# Patient Record
Sex: Female | Born: 1935
Health system: Southern US, Community
[De-identification: ages and names within clinical notes are randomized; demographics above are authoritative.]

## PROBLEM LIST (undated history)

## (undated) DIAGNOSIS — L2089 Other atopic dermatitis: Secondary | ICD-10-CM

## (undated) DIAGNOSIS — M545 Low back pain, unspecified: Secondary | ICD-10-CM

## (undated) DIAGNOSIS — J301 Allergic rhinitis due to pollen: Secondary | ICD-10-CM

## (undated) DIAGNOSIS — M810 Age-related osteoporosis without current pathological fracture: Secondary | ICD-10-CM

## (undated) DIAGNOSIS — C801 Malignant (primary) neoplasm, unspecified: Secondary | ICD-10-CM

## (undated) DIAGNOSIS — K2100 Gastro-esophageal reflux disease with esophagitis, without bleeding: Secondary | ICD-10-CM

## (undated) DIAGNOSIS — M899 Disorder of bone, unspecified: Secondary | ICD-10-CM

## (undated) DIAGNOSIS — M949 Disorder of cartilage, unspecified: Secondary | ICD-10-CM

## (undated) DIAGNOSIS — M26609 Unspecified temporomandibular joint disorder, unspecified side: Secondary | ICD-10-CM

## (undated) DIAGNOSIS — F411 Generalized anxiety disorder: Secondary | ICD-10-CM

## (undated) DIAGNOSIS — M1990 Unspecified osteoarthritis, unspecified site: Secondary | ICD-10-CM

## (undated) DIAGNOSIS — A6920 Lyme disease, unspecified: Secondary | ICD-10-CM

## (undated) DIAGNOSIS — R1319 Other dysphagia: Secondary | ICD-10-CM

## (undated) HISTORY — DX: Other dysphagia: R13.19

## (undated) HISTORY — DX: Lyme disease, unspecified: A69.20

## (undated) HISTORY — DX: Malignant (primary) neoplasm, unspecified: C80.1

## (undated) HISTORY — DX: Unspecified osteoarthritis, unspecified site: M19.90

## (undated) HISTORY — DX: Low back pain, unspecified: M54.50

## (undated) HISTORY — DX: Gastro-esophageal reflux disease with esophagitis, without bleeding: K21.00

## (undated) HISTORY — PX: EYE SURGERY: SHX253

## (undated) HISTORY — DX: Age-related osteoporosis without current pathological fracture: M81.0

## (undated) HISTORY — DX: Disorder of bone, unspecified: M89.9

## (undated) HISTORY — DX: Allergic rhinitis due to pollen: J30.1

## (undated) HISTORY — DX: Gastro-esophageal reflux disease with esophagitis: K21.0

## (undated) HISTORY — DX: Low back pain: M54.5

## (undated) HISTORY — DX: Disorder of cartilage, unspecified: M94.9

## (undated) HISTORY — PX: HERNIA REPAIR: SHX51

## (undated) HISTORY — DX: Generalized anxiety disorder: F41.1

## (undated) HISTORY — DX: Other atopic dermatitis: L20.89

## (undated) HISTORY — PX: CATARACT EXTRACTION, BILATERAL: SHX1313

---
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
## 2008-10-22 ENCOUNTER — Encounter
Admission: RE | Admit: 2008-10-22 | Discharge: 2008-10-27 | Payer: Self-pay | Admitting: Physical Medicine & Rehabilitation

## 2008-10-27 ENCOUNTER — Ambulatory Visit: Payer: Self-pay | Admitting: Physical Medicine & Rehabilitation

## 2009-08-24 ENCOUNTER — Encounter: Admission: RE | Admit: 2009-08-24 | Discharge: 2009-08-24 | Payer: Self-pay | Admitting: Obstetrics and Gynecology

## 2009-10-21 ENCOUNTER — Ambulatory Visit (HOSPITAL_COMMUNITY): Admission: RE | Admit: 2009-10-21 | Discharge: 2009-10-21 | Payer: Self-pay | Admitting: Orthopaedic Surgery

## 2010-07-17 LAB — CBC
HCT: 40 % (ref 36.0–46.0)
Hemoglobin: 13.6 g/dL (ref 12.0–15.0)
Platelets: 324 10*3/uL (ref 150–400)
RDW: 13.1 % (ref 11.5–15.5)

## 2010-07-17 LAB — SURGICAL PCR SCREEN: MRSA, PCR: NEGATIVE

## 2010-09-13 NOTE — Group Therapy Note (Signed)
Rachel Morris is here today to be evaluated for her left-sided neck pain.  She has some pain in her TMJ area as well as of her left parascapular  area.  She denies any history of trauma.  She has no history of cancer.  She had been taking Soma, but really did not want to take any medicines.  She had a trigger point injection left sternocleidomastoid and left  lateral neck, per Dr. Andrey Campanile.  She has tried physical therapy, massage,  aquatic therapy.   She walks half hour every day.  She climbs steps.  She drives.  She is  retired.  She is independent with her self-care and mobility.   Social, widowed, lives alone.  Drinks about 2 drinks per day.   Examination, in general, no acute distress.  Mood and affect  appropriate.  Neck range of motion decreased.  Left lateral bending to  about 50% of normal.  Right lateral bending at 75%.  Extension and  flexion is 75% of normal.  She has tenderness over the lateral neck on  the left side.  This is mainly in the lower area rather than in the  suboccipital area.  No tenderness over the sternocleidomastoid.  No  tenderness over the scalene musculature.  No tightness or dystonia  evident.  No evidence of torticollis.  She has tenderness over the left  upper trap as well as upper medial scapular border corresponding to  insertion site of the levator scapular as well as over the infraspinatus  area.  This is on the left side only.  Upper extremity strength is  normal.  Impingement testing is negative.   Crank test is negative.  Hawkins negative.   IMPRESSION:  Myofascial pain, left-sided neck.  She also may have some  underlying cervical spondylosis and facet syndrome.  She may have some  TMJ, but she does not really have much in terms of pain which are  opening, closing, or over the actual TMJ area.   We discussed acupuncture treatment.  The fact that you need to trial it  with weekly.  No less frequent then weekly treatments x4-6.  We can do a  initial treatment today.  She can decide on how she wants to proceed on  this.  I have given [facility] also [facility] at [facility].  He  discussed that Medicare, this is not a covered benefit under Medicare.  We discussed other treatment options including medial branch blocks.   ADDENDUM:  Treatment today consist of D14, D20, connected with 2 Hz  stem, GB 21, SI 11, connected with 2 Hz stem, SI 14, SI 19, 2 Hz stem.  20 minutes treatment time.  The patient tolerated the procedure well.      Erick Colace, M.D.  Electronically Signed     [facility]  D:  10/27/2008 16:15:51  T:  10/28/2008 07:09:27  Job #:  [id number]   cc:   Gloriajean Dell. Andrey Campanile, M.D.  Fax: [phone]

## 2010-10-25 ENCOUNTER — Other Ambulatory Visit: Payer: Self-pay | Admitting: Gastroenterology

## 2010-11-01 ENCOUNTER — Ambulatory Visit
Admission: RE | Admit: 2010-11-01 | Discharge: 2010-11-01 | Disposition: A | Discharge status: Home / Self Care | Payer: Medicare Other | Source: Ambulatory Visit | Attending: Gastroenterology | Admitting: Gastroenterology

## 2011-04-11 ENCOUNTER — Ambulatory Visit (INDEPENDENT_AMBULATORY_CARE_PROVIDER_SITE_OTHER): Payer: Medicare Other

## 2011-04-11 DIAGNOSIS — R509 Fever, unspecified: Secondary | ICD-10-CM

## 2011-04-11 DIAGNOSIS — J029 Acute pharyngitis, unspecified: Secondary | ICD-10-CM

## 2011-04-21 ENCOUNTER — Ambulatory Visit (INDEPENDENT_AMBULATORY_CARE_PROVIDER_SITE_OTHER): Payer: Medicare Other

## 2011-04-21 DIAGNOSIS — K5732 Diverticulitis of large intestine without perforation or abscess without bleeding: Secondary | ICD-10-CM

## 2011-04-21 DIAGNOSIS — R197 Diarrhea, unspecified: Secondary | ICD-10-CM

## 2011-05-09 DIAGNOSIS — R142 Eructation: Secondary | ICD-10-CM | POA: Diagnosis not present

## 2011-05-09 DIAGNOSIS — K648 Other hemorrhoids: Secondary | ICD-10-CM | POA: Diagnosis not present

## 2011-05-09 DIAGNOSIS — R1012 Left upper quadrant pain: Secondary | ICD-10-CM | POA: Diagnosis not present

## 2011-05-09 DIAGNOSIS — K5900 Constipation, unspecified: Secondary | ICD-10-CM | POA: Diagnosis not present

## 2011-05-24 DIAGNOSIS — Z961 Presence of intraocular lens: Secondary | ICD-10-CM | POA: Diagnosis not present

## 2011-05-24 DIAGNOSIS — H40059 Ocular hypertension, unspecified eye: Secondary | ICD-10-CM | POA: Diagnosis not present

## 2011-05-24 DIAGNOSIS — H43819 Vitreous degeneration, unspecified eye: Secondary | ICD-10-CM | POA: Diagnosis not present

## 2011-05-24 DIAGNOSIS — H40019 Open angle with borderline findings, low risk, unspecified eye: Secondary | ICD-10-CM | POA: Diagnosis not present

## 2011-09-15 DIAGNOSIS — L905 Scar conditions and fibrosis of skin: Secondary | ICD-10-CM | POA: Diagnosis not present

## 2011-09-15 DIAGNOSIS — L821 Other seborrheic keratosis: Secondary | ICD-10-CM | POA: Diagnosis not present

## 2011-09-15 DIAGNOSIS — L570 Actinic keratosis: Secondary | ICD-10-CM | POA: Diagnosis not present

## 2011-09-15 DIAGNOSIS — Z85828 Personal history of other malignant neoplasm of skin: Secondary | ICD-10-CM | POA: Diagnosis not present

## 2011-09-15 DIAGNOSIS — D239 Other benign neoplasm of skin, unspecified: Secondary | ICD-10-CM | POA: Diagnosis not present

## 2011-09-15 DIAGNOSIS — D485 Neoplasm of uncertain behavior of skin: Secondary | ICD-10-CM | POA: Diagnosis not present

## 2011-10-05 ENCOUNTER — Ambulatory Visit (INDEPENDENT_AMBULATORY_CARE_PROVIDER_SITE_OTHER): Payer: Medicare Other | Admitting: Emergency Medicine

## 2011-10-05 VITALS — BP 139/76 | HR 96 | Temp 98.1°F | Resp 16 | Ht 63.5 in | Wt 125.0 lb

## 2011-10-05 DIAGNOSIS — T148 Other injury of unspecified body region: Secondary | ICD-10-CM

## 2011-10-05 DIAGNOSIS — W57XXXA Bitten or stung by nonvenomous insect and other nonvenomous arthropods, initial encounter: Secondary | ICD-10-CM

## 2011-10-05 MED ORDER — DOXYCYCLINE HYCLATE 100 MG PO CAPS: 200.0000 mg | ORAL_CAPSULE | Freq: Once | ORAL | Status: AC

## 2011-10-05 NOTE — Patient Instructions (Signed)
Deer Tick Bite  Deer ticks are brown arachnids (spider family) that vary in size from as small as the head of a pin to 1/4 inch (1/2 cm) diameter. They thrive in wooded areas. Deer are the preferred host of adult deer ticks. Small rodents are the host of young ticks (nymphs). When a person walks in a field or wooded area, young and adult ticks in the surrounding grass and vegetation can attach themselves to the skin. They can suck blood for hours to days if unnoticed. Ticks are found all over the [place]  Some ticks carry a specific bacteria (Borrelia burgdorferi) that causes an infection called Lyme disease. The bacteria is typically passed into a person during the blood sucking process. This happens after the tick has been attached for at least a number of hours. While ticks can be found all over the [place] those carrying the bacteria that causes Lyme disease are most common in [place] and the [place]. Only a small proportion of ticks in these areas carry the Lyme disease bacteria and cause human infections.  Ticks usually attach to warm spots on the body, such as the:   Head.    Back.    Neck.    Armpits.    Groin.   SYMPTOMS   Most of the time, a deer tick bite will not be felt. You may or may not see the attached tick. You may notice mild irritation or redness around the bite site. If the deer tick passes the Lyme disease bacteria to a person, a round, red rash may be noticed 2 to 3 days after the bite. The rash may be clear in the middle, like a bull's-eye or target.  If not treated, other symptoms may develop several days to weeks after the onset of the rash. These symptoms may include:   New rash lesions.    Fatigue and weakness.    General ill feeling and achiness.    Chills.    Headache and neck pain.    Swollen lymph glands.    Sore muscles and joints.    5 to 15% of untreated people with Lyme disease may develop more severe illnesses after several weeks to months. This may include inflammation of the brain lining (meningitis), nerve palsies, an abnormal heartbeat, or severe muscle and joint pain and inflammation (myositis or arthritis).  DIAGNOSIS     Physical exam and medical history.    Viewing the tick if it was saved for confirmation.    Blood tests (to check or confirm the presence of Lyme disease).   TREATMENT    Most ticks do not carry disease. If found, an attached tick should be removed using tweezers. Tweezers should be placed under the body of the tick so it is removed by its attachment parts (pincers).  If there are signs or symptoms of being sick, or Lyme disease is confirmed, medicines (antibiotics) that kill germs are usually prescribed. In more severe cases, antibiotics may be given through an intravenous (IV) access.  HOME CARE INSTRUCTIONS     Always remove ticks with tweezers. Do not use petroleum jelly or other methods to kill or remove the tick. Slide the tweezers under the body and pull out as much as you can. If you are not sure what it is, save it in a jar and show your caregiver.    Once you remove the tick, the skin will heal on its own. Wash your hands and the affected area with water   and soap. You may place a bandage on the affected area.    Take medicine as directed. You may be advised to take a full course of antibiotics.    Follow up with your caregiver as recommended.   FINDING OUT THE RESULTS OF YOUR TEST  Not all test results are available during your visit. If your test results are not back during the visit, make an appointment with your caregiver to find out the results. Do not assume everything is normal if you have not heard from your caregiver or the medical facility. It is important for you to follow up on all of your test results.  PROGNOSIS     If Lyme disease is confirmed, early treatment with antibiotics is very effective. Following preventive guidelines is important since it is possible to get the disease more than once.  PREVENTION     Wear long sleeves and long pants in wooded or grassy areas. Tuck your pants into your socks.    Use an insect repellent while hiking.    Check yourself, your children, and your pets regularly for ticks after playing outside.    Clear piles of leaves or brush from your yard. Ticks might live there.   SEEK MEDICAL CARE IF:     You or your child has an oral temperature above 102 F (38.9 C).    You develop a severe headache following the bite.    You feel generally ill.    You notice a rash.    You are having trouble removing the tick.    The bite area has red skin or yellow drainage.   SEEK IMMEDIATE MEDICAL CARE IF:     Your face is weak and droopy or you have other neurological symptoms.    You have severe joint pain or weakness.   MAKE SURE YOU:     Understand these instructions.    Will watch your condition.    Will get help right away if you are not doing well or get worse.   FOR MORE INFORMATION  Centers for Disease Control and Prevention: www.cdc.gov  American Academy of Family Physicians: www.aafp.org  Document Released: 07/12/2009 Document Revised: 04/06/2011 Document Reviewed: 07/12/2009  [employer] Patient Information 2012 [employer].    Lyme Disease  You may have been bitten by a tick and are to watch for the development of Lyme Disease. Lyme Disease is an infection that is caused by a bacteria The bacteria causing this disease is named Borreilia burgdorferi. If a tick is infected with this bacteria and then bites you, then Lyme Disease may occur. These ticks are carried by deer and rodents such as rabbits and mice and infest grassy as well as forested areas. Fortunately most tick bites do not cause Lyme Disease.     Lyme Disease is easier to prevent than to treat. First, covering your legs with clothing when walking in areas where ticks are possibly abundant will prevent their attachment because ticks tend to stay within inches of the ground. Second, using insecticides containing DEET can be applied on skin or clothing. Last, because it takes about 12 to 24 hours for the tick to transmit the disease after attachment to the human host, you should inspect your body for ticks twice a day when you are in areas where Lyme Disease is common. You must look thoroughly when searching for ticks. The Ixodes tick that carries Lyme Disease is very small. It is around the size of a sesame seed (picture   of tick is not actual size). Removal is best done by grasping the tick by the head and pulling it out. Do not to squeeze the body of the tick. This could inject the infecting bacteria into the bite site. Wash the area of the bite with an antiseptic solution after removal.    Lyme Disease is a disease that may affect many body systems. Because of the small size of the biting tick, most people do not notice being bitten. The first sign of an infection is usually a round red rash that extends out from the center of the tick bite. The center of the lesion may be blood colored (hemorrhagic) or have tiny blisters (vesicular). Most lesions have bright red outer borders and partial central clearing. This rash may extend out many inches in diameter, and multiple lesions may be present. Other symptoms such as fatigue, headaches, chills and fever, general achiness and swelling of lymph glands may also occur. If this first stage of the disease is left untreated, these symptoms may gradually resolve by themselves, or progressive symptoms may occur because of spread of infection to other areas of the body.     Follow up with your caregiver to have testing and treatment if you have a tick bite and you develop any of the above complaints. Your caregiver may recommend preventative (prophylactic) medications which kill bacteria (antibiotics). Once a diagnosis of Lyme Disease is made, antibiotic treatment is highly likely to cure the disease. Effective treatment of late stage Lyme Disease may require longer courses of antibiotic therapy.    MAKE SURE YOU:     Understand these instructions.    Will watch your condition.    Will get help right away if you are not doing well or get worse.   Document Released: 07/24/2000 Document Revised: 04/06/2011 Document Reviewed: 09/25/2008  [employer] Patient Information 2012 [employer].

## 2011-10-05 NOTE — Progress Notes (Signed)
  Subjective:    Patient ID: Rachel Morris, female    DOB: 08-22-1930, 78 y.o.   MRN: [id number]  HPI Comments: Found two ticks on skin one embedded other ambulating.  History of lyme disease and is quite worried about again being infected.  Tick may have been embedded for several days.  Asymptomatic      Review of Systems  Constitutional: Negative.   HENT: Negative.   Eyes: Negative.   Respiratory: Negative.   Cardiovascular: Negative.   Gastrointestinal: Negative.   Genitourinary: Negative.   Musculoskeletal: Negative.        Objective:   Physical Exam  Constitutional: She appears well-developed and well-nourished.  HENT:  Head: Normocephalic and atraumatic.  Right Ear: External ear normal.  Left Ear: External ear normal.  Mouth/Throat: Oropharynx is clear and moist.  Eyes: Conjunctivae and EOM are normal. Pupils are equal, round, and reactive to light.  Neck: Normal range of motion. Neck supple.  Cardiovascular: Normal rate and regular rhythm.   Pulmonary/Chest: Effort normal.  Abdominal: Soft.  Musculoskeletal: Normal range of motion.  Lymphadenopathy:    She has no cervical adenopathy.  Skin: Skin is warm and dry.          Assessment & Plan:  Discussed risk and benefit of prophylaxis vs treatment.  She is concerned that she should have labs done and i reassured her that it was too early for acute labs to be positive given her time frame and absence of symptoms.

## 2011-10-07 DIAGNOSIS — H60399 Other infective otitis externa, unspecified ear: Secondary | ICD-10-CM | POA: Diagnosis not present

## 2011-10-13 ENCOUNTER — Other Ambulatory Visit: Payer: Self-pay | Admitting: Internal Medicine

## 2011-10-13 DIAGNOSIS — L03211 Cellulitis of face: Secondary | ICD-10-CM | POA: Diagnosis not present

## 2011-10-13 DIAGNOSIS — M545 Low back pain, unspecified: Secondary | ICD-10-CM | POA: Diagnosis not present

## 2011-10-13 DIAGNOSIS — L2089 Other atopic dermatitis: Secondary | ICD-10-CM | POA: Diagnosis not present

## 2011-10-13 DIAGNOSIS — Z13220 Encounter for screening for lipoid disorders: Secondary | ICD-10-CM | POA: Diagnosis not present

## 2011-10-13 DIAGNOSIS — Z780 Asymptomatic menopausal state: Secondary | ICD-10-CM

## 2011-10-13 DIAGNOSIS — R1319 Other dysphagia: Secondary | ICD-10-CM | POA: Diagnosis not present

## 2011-10-13 DIAGNOSIS — E785 Hyperlipidemia, unspecified: Secondary | ICD-10-CM | POA: Diagnosis not present

## 2011-10-13 DIAGNOSIS — L0201 Cutaneous abscess of face: Secondary | ICD-10-CM | POA: Diagnosis not present

## 2011-10-13 DIAGNOSIS — M899 Disorder of bone, unspecified: Secondary | ICD-10-CM | POA: Diagnosis not present

## 2011-10-13 DIAGNOSIS — F411 Generalized anxiety disorder: Secondary | ICD-10-CM | POA: Diagnosis not present

## 2011-10-14 DIAGNOSIS — R21 Rash and other nonspecific skin eruption: Secondary | ICD-10-CM | POA: Diagnosis not present

## 2011-10-20 ENCOUNTER — Other Ambulatory Visit: Payer: Self-pay | Admitting: Internal Medicine

## 2011-10-20 ENCOUNTER — Ambulatory Visit
Admission: RE | Admit: 2011-10-20 | Discharge: 2011-10-20 | Disposition: A | Discharge status: Home / Self Care | Payer: Medicare Other | Source: Ambulatory Visit | Attending: Internal Medicine | Admitting: Internal Medicine

## 2011-10-20 DIAGNOSIS — M25559 Pain in unspecified hip: Secondary | ICD-10-CM

## 2011-10-20 DIAGNOSIS — M545 Low back pain: Secondary | ICD-10-CM

## 2011-10-20 DIAGNOSIS — L0201 Cutaneous abscess of face: Secondary | ICD-10-CM | POA: Diagnosis not present

## 2011-10-20 DIAGNOSIS — M899 Disorder of bone, unspecified: Secondary | ICD-10-CM | POA: Diagnosis not present

## 2011-10-20 DIAGNOSIS — L03211 Cellulitis of face: Secondary | ICD-10-CM | POA: Diagnosis not present

## 2011-10-20 DIAGNOSIS — M549 Dorsalgia, unspecified: Secondary | ICD-10-CM

## 2011-10-20 DIAGNOSIS — E785 Hyperlipidemia, unspecified: Secondary | ICD-10-CM | POA: Diagnosis not present

## 2011-10-20 DIAGNOSIS — M169 Osteoarthritis of hip, unspecified: Secondary | ICD-10-CM | POA: Diagnosis not present

## 2011-10-20 DIAGNOSIS — M949 Disorder of cartilage, unspecified: Secondary | ICD-10-CM | POA: Diagnosis not present

## 2011-10-20 DIAGNOSIS — J301 Allergic rhinitis due to pollen: Secondary | ICD-10-CM | POA: Diagnosis not present

## 2011-10-20 DIAGNOSIS — M47814 Spondylosis without myelopathy or radiculopathy, thoracic region: Secondary | ICD-10-CM | POA: Diagnosis not present

## 2011-10-20 DIAGNOSIS — M5137 Other intervertebral disc degeneration, lumbosacral region: Secondary | ICD-10-CM | POA: Diagnosis not present

## 2011-10-25 ENCOUNTER — Ambulatory Visit: Payer: Medicare Other | Attending: Internal Medicine

## 2011-10-25 DIAGNOSIS — M25559 Pain in unspecified hip: Secondary | ICD-10-CM | POA: Diagnosis not present

## 2011-10-25 DIAGNOSIS — IMO0001 Reserved for inherently not codable concepts without codable children: Secondary | ICD-10-CM | POA: Insufficient documentation

## 2011-10-25 DIAGNOSIS — M545 Low back pain, unspecified: Secondary | ICD-10-CM | POA: Diagnosis not present

## 2011-10-26 ENCOUNTER — Ambulatory Visit
Admission: RE | Admit: 2011-10-26 | Discharge: 2011-10-26 | Disposition: A | Discharge status: Home / Self Care | Payer: Medicare Other | Source: Ambulatory Visit | Attending: Internal Medicine | Admitting: Internal Medicine

## 2011-10-26 DIAGNOSIS — Z780 Asymptomatic menopausal state: Secondary | ICD-10-CM

## 2011-10-26 DIAGNOSIS — Z13820 Encounter for screening for osteoporosis: Secondary | ICD-10-CM | POA: Diagnosis not present

## 2011-11-03 ENCOUNTER — Ambulatory Visit: Payer: Medicare Other | Attending: Internal Medicine

## 2011-11-03 DIAGNOSIS — IMO0001 Reserved for inherently not codable concepts without codable children: Secondary | ICD-10-CM | POA: Insufficient documentation

## 2011-11-03 DIAGNOSIS — M545 Low back pain, unspecified: Secondary | ICD-10-CM | POA: Insufficient documentation

## 2011-11-03 DIAGNOSIS — M25559 Pain in unspecified hip: Secondary | ICD-10-CM | POA: Insufficient documentation

## 2011-11-06 ENCOUNTER — Ambulatory Visit: Payer: Medicare Other | Admitting: Physical Therapy

## 2011-11-06 DIAGNOSIS — IMO0001 Reserved for inherently not codable concepts without codable children: Secondary | ICD-10-CM | POA: Diagnosis not present

## 2011-11-06 DIAGNOSIS — M545 Low back pain: Secondary | ICD-10-CM | POA: Diagnosis not present

## 2011-11-06 DIAGNOSIS — M25559 Pain in unspecified hip: Secondary | ICD-10-CM | POA: Diagnosis not present

## 2011-11-08 ENCOUNTER — Ambulatory Visit: Payer: Medicare Other

## 2011-11-08 DIAGNOSIS — M25559 Pain in unspecified hip: Secondary | ICD-10-CM | POA: Diagnosis not present

## 2011-11-08 DIAGNOSIS — IMO0001 Reserved for inherently not codable concepts without codable children: Secondary | ICD-10-CM | POA: Diagnosis not present

## 2011-11-08 DIAGNOSIS — M545 Low back pain: Secondary | ICD-10-CM | POA: Diagnosis not present

## 2011-11-13 ENCOUNTER — Ambulatory Visit: Payer: Medicare Other

## 2011-11-13 DIAGNOSIS — M25559 Pain in unspecified hip: Secondary | ICD-10-CM | POA: Diagnosis not present

## 2011-11-13 DIAGNOSIS — IMO0001 Reserved for inherently not codable concepts without codable children: Secondary | ICD-10-CM | POA: Diagnosis not present

## 2011-11-13 DIAGNOSIS — M545 Low back pain: Secondary | ICD-10-CM | POA: Diagnosis not present

## 2011-11-15 ENCOUNTER — Ambulatory Visit: Payer: Medicare Other | Admitting: Rehabilitation

## 2011-11-15 DIAGNOSIS — M545 Low back pain: Secondary | ICD-10-CM | POA: Diagnosis not present

## 2011-11-15 DIAGNOSIS — IMO0001 Reserved for inherently not codable concepts without codable children: Secondary | ICD-10-CM | POA: Diagnosis not present

## 2011-11-15 DIAGNOSIS — M25559 Pain in unspecified hip: Secondary | ICD-10-CM | POA: Diagnosis not present

## 2011-11-20 ENCOUNTER — Ambulatory Visit: Payer: Medicare Other | Admitting: Rehabilitation

## 2011-11-20 DIAGNOSIS — M25559 Pain in unspecified hip: Secondary | ICD-10-CM | POA: Diagnosis not present

## 2011-11-20 DIAGNOSIS — IMO0001 Reserved for inherently not codable concepts without codable children: Secondary | ICD-10-CM | POA: Diagnosis not present

## 2011-11-20 DIAGNOSIS — M545 Low back pain: Secondary | ICD-10-CM | POA: Diagnosis not present

## 2011-11-21 DIAGNOSIS — H40059 Ocular hypertension, unspecified eye: Secondary | ICD-10-CM | POA: Diagnosis not present

## 2011-11-21 DIAGNOSIS — H43819 Vitreous degeneration, unspecified eye: Secondary | ICD-10-CM | POA: Diagnosis not present

## 2011-11-21 DIAGNOSIS — H40019 Open angle with borderline findings, low risk, unspecified eye: Secondary | ICD-10-CM | POA: Diagnosis not present

## 2011-11-21 DIAGNOSIS — Z961 Presence of intraocular lens: Secondary | ICD-10-CM | POA: Diagnosis not present

## 2011-11-23 ENCOUNTER — Ambulatory Visit: Payer: Medicare Other | Admitting: Physical Therapy

## 2011-11-23 DIAGNOSIS — IMO0001 Reserved for inherently not codable concepts without codable children: Secondary | ICD-10-CM | POA: Diagnosis not present

## 2011-11-23 DIAGNOSIS — M545 Low back pain: Secondary | ICD-10-CM | POA: Diagnosis not present

## 2011-11-23 DIAGNOSIS — M25559 Pain in unspecified hip: Secondary | ICD-10-CM | POA: Diagnosis not present

## 2011-11-27 ENCOUNTER — Ambulatory Visit: Payer: Medicare Other | Admitting: Rehabilitation

## 2011-11-27 DIAGNOSIS — L259 Unspecified contact dermatitis, unspecified cause: Secondary | ICD-10-CM | POA: Diagnosis not present

## 2011-11-27 DIAGNOSIS — IMO0001 Reserved for inherently not codable concepts without codable children: Secondary | ICD-10-CM | POA: Diagnosis not present

## 2011-11-27 DIAGNOSIS — L821 Other seborrheic keratosis: Secondary | ICD-10-CM | POA: Diagnosis not present

## 2011-11-27 DIAGNOSIS — M25559 Pain in unspecified hip: Secondary | ICD-10-CM | POA: Diagnosis not present

## 2011-11-27 DIAGNOSIS — M545 Low back pain: Secondary | ICD-10-CM | POA: Diagnosis not present

## 2011-11-29 ENCOUNTER — Ambulatory Visit: Payer: Medicare Other | Admitting: Physical Therapy

## 2011-11-29 DIAGNOSIS — M545 Low back pain: Secondary | ICD-10-CM | POA: Diagnosis not present

## 2011-11-29 DIAGNOSIS — M25559 Pain in unspecified hip: Secondary | ICD-10-CM | POA: Diagnosis not present

## 2011-11-29 DIAGNOSIS — IMO0001 Reserved for inherently not codable concepts without codable children: Secondary | ICD-10-CM | POA: Diagnosis not present

## 2011-12-06 ENCOUNTER — Ambulatory Visit: Payer: Medicare Other | Attending: Internal Medicine | Admitting: Rehabilitation

## 2011-12-06 DIAGNOSIS — M545 Low back pain, unspecified: Secondary | ICD-10-CM | POA: Insufficient documentation

## 2011-12-06 DIAGNOSIS — IMO0001 Reserved for inherently not codable concepts without codable children: Secondary | ICD-10-CM | POA: Diagnosis not present

## 2011-12-06 DIAGNOSIS — M25559 Pain in unspecified hip: Secondary | ICD-10-CM | POA: Diagnosis not present

## 2011-12-08 ENCOUNTER — Ambulatory Visit: Payer: Medicare Other | Admitting: Rehabilitation

## 2011-12-08 DIAGNOSIS — M25559 Pain in unspecified hip: Secondary | ICD-10-CM | POA: Diagnosis not present

## 2011-12-08 DIAGNOSIS — IMO0001 Reserved for inherently not codable concepts without codable children: Secondary | ICD-10-CM | POA: Diagnosis not present

## 2011-12-08 DIAGNOSIS — M545 Low back pain: Secondary | ICD-10-CM | POA: Diagnosis not present

## 2011-12-11 ENCOUNTER — Ambulatory Visit: Payer: Medicare Other | Admitting: Rehabilitation

## 2011-12-11 DIAGNOSIS — M545 Low back pain: Secondary | ICD-10-CM | POA: Diagnosis not present

## 2011-12-11 DIAGNOSIS — M25559 Pain in unspecified hip: Secondary | ICD-10-CM | POA: Diagnosis not present

## 2011-12-11 DIAGNOSIS — IMO0001 Reserved for inherently not codable concepts without codable children: Secondary | ICD-10-CM | POA: Diagnosis not present

## 2011-12-13 ENCOUNTER — Ambulatory Visit: Payer: Medicare Other | Admitting: Rehabilitation

## 2011-12-13 DIAGNOSIS — IMO0001 Reserved for inherently not codable concepts without codable children: Secondary | ICD-10-CM | POA: Diagnosis not present

## 2011-12-13 DIAGNOSIS — L259 Unspecified contact dermatitis, unspecified cause: Secondary | ICD-10-CM | POA: Diagnosis not present

## 2011-12-13 DIAGNOSIS — M545 Low back pain: Secondary | ICD-10-CM | POA: Diagnosis not present

## 2011-12-13 DIAGNOSIS — M25559 Pain in unspecified hip: Secondary | ICD-10-CM | POA: Diagnosis not present

## 2011-12-20 ENCOUNTER — Ambulatory Visit: Payer: Medicare Other | Admitting: Physical Therapy

## 2011-12-20 DIAGNOSIS — M25559 Pain in unspecified hip: Secondary | ICD-10-CM | POA: Diagnosis not present

## 2011-12-20 DIAGNOSIS — IMO0001 Reserved for inherently not codable concepts without codable children: Secondary | ICD-10-CM | POA: Diagnosis not present

## 2011-12-20 DIAGNOSIS — M545 Low back pain: Secondary | ICD-10-CM | POA: Diagnosis not present

## 2011-12-22 ENCOUNTER — Ambulatory Visit: Payer: Medicare Other | Admitting: Rehabilitation

## 2011-12-22 DIAGNOSIS — IMO0001 Reserved for inherently not codable concepts without codable children: Secondary | ICD-10-CM | POA: Diagnosis not present

## 2011-12-22 DIAGNOSIS — M25559 Pain in unspecified hip: Secondary | ICD-10-CM | POA: Diagnosis not present

## 2011-12-22 DIAGNOSIS — M545 Low back pain: Secondary | ICD-10-CM | POA: Diagnosis not present

## 2011-12-29 DIAGNOSIS — L259 Unspecified contact dermatitis, unspecified cause: Secondary | ICD-10-CM | POA: Diagnosis not present

## 2011-12-29 DIAGNOSIS — I789 Disease of capillaries, unspecified: Secondary | ICD-10-CM | POA: Diagnosis not present

## 2012-01-29 DIAGNOSIS — J3081 Allergic rhinitis due to animal (cat) (dog) hair and dander: Secondary | ICD-10-CM | POA: Diagnosis not present

## 2012-01-29 DIAGNOSIS — J301 Allergic rhinitis due to pollen: Secondary | ICD-10-CM | POA: Diagnosis not present

## 2012-01-29 DIAGNOSIS — L2089 Other atopic dermatitis: Secondary | ICD-10-CM | POA: Diagnosis not present

## 2012-01-29 DIAGNOSIS — J3089 Other allergic rhinitis: Secondary | ICD-10-CM | POA: Diagnosis not present

## 2012-02-15 DIAGNOSIS — Z23 Encounter for immunization: Secondary | ICD-10-CM | POA: Diagnosis not present

## 2012-04-01 DIAGNOSIS — M545 Low back pain, unspecified: Secondary | ICD-10-CM | POA: Diagnosis not present

## 2012-04-01 DIAGNOSIS — N8111 Cystocele, midline: Secondary | ICD-10-CM | POA: Diagnosis not present

## 2012-04-01 DIAGNOSIS — J301 Allergic rhinitis due to pollen: Secondary | ICD-10-CM | POA: Diagnosis not present

## 2012-04-01 DIAGNOSIS — L2089 Other atopic dermatitis: Secondary | ICD-10-CM | POA: Diagnosis not present

## 2012-05-28 DIAGNOSIS — H40059 Ocular hypertension, unspecified eye: Secondary | ICD-10-CM | POA: Diagnosis not present

## 2012-05-28 DIAGNOSIS — H40019 Open angle with borderline findings, low risk, unspecified eye: Secondary | ICD-10-CM | POA: Diagnosis not present

## 2012-05-28 DIAGNOSIS — Z961 Presence of intraocular lens: Secondary | ICD-10-CM | POA: Diagnosis not present

## 2012-05-28 DIAGNOSIS — H43819 Vitreous degeneration, unspecified eye: Secondary | ICD-10-CM | POA: Diagnosis not present

## 2012-06-24 DIAGNOSIS — J3089 Other allergic rhinitis: Secondary | ICD-10-CM | POA: Diagnosis not present

## 2012-06-24 DIAGNOSIS — J3081 Allergic rhinitis due to animal (cat) (dog) hair and dander: Secondary | ICD-10-CM | POA: Diagnosis not present

## 2012-06-24 DIAGNOSIS — J301 Allergic rhinitis due to pollen: Secondary | ICD-10-CM | POA: Diagnosis not present

## 2012-06-24 DIAGNOSIS — L2089 Other atopic dermatitis: Secondary | ICD-10-CM | POA: Diagnosis not present

## 2012-06-25 DIAGNOSIS — F411 Generalized anxiety disorder: Secondary | ICD-10-CM | POA: Diagnosis not present

## 2012-06-25 DIAGNOSIS — R079 Chest pain, unspecified: Secondary | ICD-10-CM | POA: Diagnosis not present

## 2012-07-02 DIAGNOSIS — F411 Generalized anxiety disorder: Secondary | ICD-10-CM | POA: Diagnosis not present

## 2012-07-02 DIAGNOSIS — R079 Chest pain, unspecified: Secondary | ICD-10-CM | POA: Diagnosis not present

## 2012-07-08 DIAGNOSIS — I209 Angina pectoris, unspecified: Secondary | ICD-10-CM | POA: Diagnosis not present

## 2012-07-18 ENCOUNTER — Telehealth: Payer: Self-pay | Admitting: *Deleted

## 2012-07-18 NOTE — Telephone Encounter (Signed)
Patient called and wanted too know if her Stress test results are ready. I told her that when we get the results we would call her. She said thank you and hung up.

## 2012-08-13 ENCOUNTER — Encounter: Payer: Self-pay | Admitting: Internal Medicine

## 2012-09-01 ENCOUNTER — Ambulatory Visit (INDEPENDENT_AMBULATORY_CARE_PROVIDER_SITE_OTHER): Payer: Medicare Other | Admitting: Family Medicine

## 2012-09-01 VITALS — BP 136/70 | HR 80 | Temp 98.6°F | Resp 16 | Ht 63.5 in | Wt 135.0 lb

## 2012-09-01 DIAGNOSIS — R300 Dysuria: Secondary | ICD-10-CM | POA: Diagnosis not present

## 2012-09-01 DIAGNOSIS — N390 Urinary tract infection, site not specified: Secondary | ICD-10-CM

## 2012-09-01 DIAGNOSIS — L293 Anogenital pruritus, unspecified: Secondary | ICD-10-CM | POA: Diagnosis not present

## 2012-09-01 DIAGNOSIS — R350 Frequency of micturition: Secondary | ICD-10-CM | POA: Diagnosis not present

## 2012-09-01 DIAGNOSIS — N898 Other specified noninflammatory disorders of vagina: Secondary | ICD-10-CM

## 2012-09-01 LAB — POCT URINALYSIS DIPSTICK
Protein, UA: NEGATIVE
Spec Grav, UA: 1.01
Urobilinogen, UA: 0.2

## 2012-09-01 LAB — POCT WET PREP WITH KOH
Clue Cells Wet Prep HPF POC: NEGATIVE
KOH Prep POC: NEGATIVE
RBC Wet Prep HPF POC: NEGATIVE
Trichomonas, UA: NEGATIVE
Yeast Wet Prep HPF POC: NEGATIVE

## 2012-09-01 LAB — POCT UA - MICROSCOPIC ONLY
Mucus, UA: NEGATIVE
RBC, urine, microscopic: NEGATIVE

## 2012-09-01 MED ORDER — NITROFURANTOIN MONOHYD MACRO 100 MG PO CAPS: 100.0000 mg | ORAL_CAPSULE | Freq: Two times a day (BID) | ORAL | Status: DC

## 2012-09-01 MED ORDER — SULFAMETHOXAZOLE-TRIMETHOPRIM 800-160 MG PO TABS: 1.0000 | ORAL_TABLET | Freq: Two times a day (BID) | ORAL | Status: DC

## 2012-09-01 NOTE — Progress Notes (Signed)
Subjective:    Patient ID: Rachel Morris, female    DOB: 25-Aug-1930, 79 y.o.   MRN: [id number]  HPI [name] is a 79 y.o. female  Wears pessary for uterine prolapse past 6 years.  Uses vagifem - but missed some doses recently. , notes frequency/urgency - past week.  No fever, no n/v.  Slight soreness in lower abdomen.  Noted sore sensation with urinating, slight itchy feeling in vagina - no discharge. No recent antibiotics. No new back pain.  Tx: cranberry juices, and probiotic.   Review of Systems  Constitutional: Negative for fever and chills.  Gastrointestinal: Positive for abdominal pain (lower abdomen - slight uncomfortable. ).  Genitourinary: Positive for dysuria, urgency and frequency. Negative for hematuria, decreased urine volume, vaginal bleeding and vaginal discharge.  Musculoskeletal: Negative for back pain (no new back pain. ).  Skin: Negative for rash.       Objective:   Physical Exam  Vitals reviewed. Constitutional: She is oriented to person, place, and time. She appears well-developed and well-nourished.  HENT:  Head: Normocephalic and atraumatic.  Pulmonary/Chest: Effort normal.  Abdominal: Soft. Normal appearance. She exhibits no distension. There is tenderness (minimmal ). There is no rebound, no guarding and no CVA tenderness.  Genitourinary: There is no rash on the right labia. There is no rash on the left labia. No erythema, tenderness or bleeding around the vagina. No foreign body around the vagina. Vaginal discharge found.  Slightly atrophic.   Neurological: She is alert and oriented to person, place, and time.  Skin: Skin is warm and dry. No rash noted.  Psychiatric: She has a normal mood and affect. Her behavior is normal.     Results for orders placed in visit on 09/01/12  POCT UA - MICROSCOPIC ONLY      Result Value Range   WBC, Ur, HPF, POC 24-37     RBC, urine, microscopic neg     Bacteria, U Microscopic 2+     Mucus, UA neg     Epithelial cells, urine per micros 0-4     Crystals, Ur, HPF, POC neg     Casts, Ur, LPF, POC neg     Yeast, UA neg    POCT URINALYSIS DIPSTICK      Result Value Range   Color, UA yellow     Clarity, UA cloudy     Glucose, UA neg     Bilirubin, UA neg     Ketones, UA neg     Spec Grav, UA 1.010     Blood, UA trace     pH, UA 6.5     Protein, UA neg     Urobilinogen, UA 0.2     Nitrite, UA neg     Leukocytes, UA large (3+)    POCT WET PREP WITH KOH      Result Value Range   Trichomonas, UA Negative     Clue Cells Wet Prep HPF POC neg     Epithelial Wet Prep HPF POC 2-5     Yeast Wet Prep HPF POC neg     Bacteria Wet Prep HPF POC 2+     RBC Wet Prep HPF POC neg     WBC Wet Prep HPF POC 2-6     KOH Prep POC Negative        Assessment & Plan:  [name] is a 79 y.o. female Dysuria - Plan: POCT UA - Microscopic Only, POCT urinalysis dipstick, Urine culture  Urinary frequency - Plan: POCT UA - Microscopic Only, POCT urinalysis dipstick, Urine culture  UTI (urinary tract infection) - Plan: Urine culture, sulfamethoxazole-trimethoprim (BACTRIM DS,SEPTRA DS) 800-160 MG per tablet, DISCONTINUED: nitrofurantoin, macrocrystal-monohydrate, (MACROBID) 100 MG capsule  Vaginal pruritus - Plan: POCT Wet Prep with KOH   UTI - septra DS for 5 days. Urine cx, sx care below, rtc precautions.   Pruritus- atrophic vaginitis likley.  Restart vagifem.   Meds ordered this encounter  Medications  . fexofenadine (ALLEGRA) 30 MG tablet    Sig: Take 30 mg by mouth daily.  [name] DISCONTD: nitrofurantoin, macrocrystal-monohydrate, (MACROBID) 100 MG capsule    Sig: Take 1 capsule (100 mg total) by mouth 2 (two) times daily.    Dispense:  14 capsule    Refill:  0  . sulfamethoxazole-trimethoprim (BACTRIM DS,SEPTRA DS) 800-160 MG per tablet    Sig: Take 1 tablet by mouth 2 (two) times daily.    Dispense:  10 tablet    Refill:  0   Patient Instructions  Start the antibiotic for a urinary  tract infection for  5 days.   Other treatments as below. Your should receive a call or letter about your lab results within the next week to 10 days. Continue the vagifem, and recheck if itching not improving.  Return to the clinic or go to the nearest emergency room if any of your symptoms worsen or new symptoms occur.  Urinary Tract Infection Urinary tract infections (UTIs) can develop anywhere along your urinary tract. Your urinary tract is your body's drainage system for removing wastes and extra water. Your urinary tract includes two kidneys, two ureters, a bladder, and a urethra. Your kidneys are a pair of bean-shaped organs. Each kidney is about the size of your fist. They are located below your ribs, one on each side of your spine. CAUSES Infections are caused by microbes, which are microscopic organisms, including fungi, viruses, and bacteria. These organisms are so small that they can only be seen through a microscope. Bacteria are the microbes that most commonly cause UTIs. SYMPTOMS  Symptoms of UTIs may vary by age and gender of the patient and by the location of the infection. Symptoms in young women typically include a frequent and intense urge to urinate and a painful, burning feeling in the bladder or urethra during urination. Older women and men are more likely to be tired, shaky, and weak and have muscle aches and abdominal pain. A fever may mean the infection is in your kidneys. Other symptoms of a kidney infection include pain in your back or sides below the ribs, nausea, and vomiting. DIAGNOSIS To diagnose a UTI, your caregiver will ask you about your symptoms. Your caregiver also will ask to provide a urine sample. The urine sample will be tested for bacteria and white blood cells. White blood cells are made by your body to help fight infection. TREATMENT  Typically, UTIs can be treated with medication. Because most UTIs are caused by a bacterial infection, they usually can be  treated with the use of antibiotics. The choice of antibiotic and length of treatment depend on your symptoms and the type of bacteria causing your infection. HOME CARE INSTRUCTIONS  If you were prescribed antibiotics, take them exactly as your caregiver instructs you. Finish the medication even if you feel better after you have only taken some of the medication.  Drink enough water and fluids to keep your urine clear or pale yellow.  Avoid caffeine, tea, and carbonated  beverages. They tend to irritate your bladder.  Empty your bladder often. Avoid holding urine for long periods of time.  Empty your bladder before and after sexual intercourse.  After a bowel movement, women should cleanse from front to back. Use each tissue only once. SEEK MEDICAL CARE IF:   You have back pain.  You develop a fever.  Your symptoms do not begin to resolve within 3 days. SEEK IMMEDIATE MEDICAL CARE IF:   You have severe back pain or lower abdominal pain.  You develop chills.  You have nausea or vomiting.  You have continued burning or discomfort with urination. MAKE SURE YOU:   Understand these instructions.  Will watch your condition.  Will get help right away if you are not doing well or get worse. Document Released: 01/25/2005 Document Revised: 10/17/2011 Document Reviewed: 05/26/2011 [facility] Patient Information 2013 [place], [place].

## 2012-09-01 NOTE — Patient Instructions (Addendum)
Start the antibiotic for a urinary tract infection for  5 days.   Other treatments as below. Your should receive a call or letter about your lab results within the next week to 10 days. Continue the vagifem, and recheck if itching not improving.  Return to the clinic or go to the nearest emergency room if any of your symptoms worsen or new symptoms occur.  Urinary Tract Infection Urinary tract infections (UTIs) can develop anywhere along your urinary tract. Your urinary tract is your body's drainage system for removing wastes and extra water. Your urinary tract includes two kidneys, two ureters, a bladder, and a urethra. Your kidneys are a pair of bean-shaped organs. Each kidney is about the size of your fist. They are located below your ribs, one on each side of your spine. CAUSES Infections are caused by microbes, which are microscopic organisms, including fungi, viruses, and bacteria. These organisms are so small that they can only be seen through a microscope. Bacteria are the microbes that most commonly cause UTIs. SYMPTOMS  Symptoms of UTIs may vary by age and gender of the patient and by the location of the infection. Symptoms in young women typically include a frequent and intense urge to urinate and a painful, burning feeling in the bladder or urethra during urination. Older women and men are more likely to be tired, shaky, and weak and have muscle aches and abdominal pain. A fever may mean the infection is in your kidneys. Other symptoms of a kidney infection include pain in your back or sides below the ribs, nausea, and vomiting. DIAGNOSIS To diagnose a UTI, your caregiver will ask you about your symptoms. Your caregiver also will ask to provide a urine sample. The urine sample will be tested for bacteria and white blood cells. White blood cells are made by your body to help fight infection. TREATMENT  Typically, UTIs can be treated with medication. Because most UTIs are caused by a bacterial  infection, they usually can be treated with the use of antibiotics. The choice of antibiotic and length of treatment depend on your symptoms and the type of bacteria causing your infection. HOME CARE INSTRUCTIONS  If you were prescribed antibiotics, take them exactly as your caregiver instructs you. Finish the medication even if you feel better after you have only taken some of the medication.  Drink enough water and fluids to keep your urine clear or pale yellow.  Avoid caffeine, tea, and carbonated beverages. They tend to irritate your bladder.  Empty your bladder often. Avoid holding urine for long periods of time.  Empty your bladder before and after sexual intercourse.  After a bowel movement, women should cleanse from front to back. Use each tissue only once. SEEK MEDICAL CARE IF:   You have back pain.  You develop a fever.  Your symptoms do not begin to resolve within 3 days. SEEK IMMEDIATE MEDICAL CARE IF:   You have severe back pain or lower abdominal pain.  You develop chills.  You have nausea or vomiting.  You have continued burning or discomfort with urination. MAKE SURE YOU:   Understand these instructions.  Will watch your condition.  Will get help right away if you are not doing well or get worse. Document Released: 01/25/2005 Document Revised: 10/17/2011 Document Reviewed: 05/26/2011 [facility] Patient Information 2013 [place], [place].

## 2012-09-03 LAB — URINE CULTURE: Colony Count: >=100000

## 2012-09-09 ENCOUNTER — Encounter: Payer: Self-pay | Admitting: Gynecology

## 2012-09-09 ENCOUNTER — Ambulatory Visit (INDEPENDENT_AMBULATORY_CARE_PROVIDER_SITE_OTHER): Payer: Medicare Other | Admitting: Gynecology

## 2012-09-09 VITALS — BP 120/72 | Ht 63.0 in | Wt 136.0 lb

## 2012-09-09 DIAGNOSIS — N814 Uterovaginal prolapse, unspecified: Secondary | ICD-10-CM | POA: Diagnosis not present

## 2012-09-09 DIAGNOSIS — N952 Postmenopausal atrophic vaginitis: Secondary | ICD-10-CM | POA: Diagnosis not present

## 2012-09-09 DIAGNOSIS — M810 Age-related osteoporosis without current pathological fracture: Secondary | ICD-10-CM

## 2012-09-09 LAB — CALCIUM: Calcium: 10.2 mg/dL (ref 8.4–10.5)

## 2012-09-09 LAB — CREATININE, SERUM: Creat: 0.65 mg/dL (ref 0.50–1.10)

## 2012-09-09 MED ORDER — ESTRADIOL 10 MCG VA TABS: 1.0000 | ORAL_TABLET | VAGINAL | Status: DC

## 2012-09-09 NOTE — Patient Instructions (Signed)
Denosumab injection What is this medicine? DENOSUMAB slows bone breakdown. It is used to treat osteoporosis in women after menopause and in men. This medicine is also used to prevent bone fractures and other bone problems caused by cancer bone metastases. This medicine may be used for other purposes; ask your health care provider or pharmacist if you have questions. What should I tell my health care provider before I take this medicine? They need to know if you have any of these conditions: -dental disease -eczema -infection or history of infections -kidney disease or on dialysis -low blood calcium or vitamin D -malabsorption syndrome -scheduled to have surgery or tooth extraction -taking medicine that contains denosumab -thyroid or parathyroid disease -an unusual reaction to denosumab, other medicines, foods, dyes, or preservatives -pregnant or trying to get pregnant -breast-feeding How should I use this medicine? This medicine is for injection under the skin. It is given by a health care professional in a hospital or clinic setting. If you are getting Prolia, a special MedGuide will be given to you by the pharmacist with each prescription and refill. Be sure to read this information carefully each time. Talk to your pediatrician regarding the use of this medicine in children. Special care may be needed. Overdosage: If you think you've taken too much of this medicine contact a poison control center or emergency room at once. Overdosage: If you think you have taken too much of this medicine contact a poison control center or emergency room at once. NOTE: This medicine is only for you. Do not share this medicine with others. What if I miss a dose? It is important not to miss your dose. Call your doctor or health care professional if you are unable to keep an appointment. What may interact with this medicine? Do not take this medicine with any of the following medications: -other medicines  containing denosumab This medicine may also interact with the following medications: -medicines that suppress the immune system -medicines that treat cancer -steroid medicines like prednisone or cortisone This list may not describe all possible interactions. Give your health care provider a list of all the medicines, herbs, non-prescription drugs, or dietary supplements you use. Also tell them if you smoke, drink alcohol, or use illegal drugs. Some items may interact with your medicine. What should I watch for while using this medicine? Visit your doctor or health care professional for regular checks on your progress. Your doctor or health care professional may order blood tests and other tests to see how you are doing. Call your doctor or health care professional if you get a cold or other infection while receiving this medicine. Do not treat yourself. This medicine may decrease your body's ability to fight infection. You should make sure you get enough calcium and vitamin D while you are taking this medicine, unless your doctor tells you not to. Discuss the foods you eat and the vitamins you take with your health care professional. See your dentist regularly. Brush and floss your teeth as directed. Before you have any dental work done, tell your dentist you are receiving this medicine. What side effects may I notice from receiving this medicine? Side effects that you should report to your doctor or health care professional as soon as possible: -allergic reactions like skin rash, itching or hives, swelling of the face, lips, or tongue -breathing problems -chest pain -fast, irregular heartbeat -feeling faint or lightheaded, falls -fever, chills, or any other sign of infection -muscle spasms, tightening, or twitches -numbness  or tingling -skin blisters or bumps, or is dry, peels, or red -slow healing or unexplained pain in the mouth or jaw -unusual bleeding or bruising Side effects that  usually do not require medical attention (Report these to your doctor or health care professional if they continue or are bothersome.): -muscle pain -stomach upset, gas This list may not describe all possible side effects. Call your doctor for medical advice about side effects. You may report side effects to FDA at 1-800-FDA-1088. Where should I keep my medicine? This medicine is only given in a clinic, doctor's office, or other health care setting and will not be stored at home. NOTE: This sheet is a summary. It may not cover all possible information. If you have questions about this medicine, talk to your doctor, pharmacist, or health care provider.  2013, [employer]vier/Gold Standard. (01/24/2011 3:40:41 PM) Osteoporosis Throughout your life, your body breaks down old bone and replaces it with new bone. As you get older, your body does not replace bone as quickly as it breaks it down. By the age of 30 years, most people begin to gradually lose bone because of the imbalance between bone loss and replacement. Some people lose more bone than others. Bone loss beyond a specified normal degree is considered osteoporosis.  Osteoporosis affects the strength and durability of your bones. The inside of the ends of your bones and your flat bones, like the bones of your pelvis, look like honeycomb, filled with tiny open spaces. As bone loss occurs, your bones become less dense. This means that the open spaces inside your bones become bigger and the walls between these spaces become thinner. This makes your bones weaker. Bones of a person with osteoporosis can become so weak that they can break (fracture) during minor accidents, such as a simple fall. CAUSES  The following factors have been associated with the development of osteoporosis:  Smoking.  Drinking more than 2 alcoholic drinks several days per week.  Long-term use of certain medicines:  Corticosteroids.  Chemotherapy medicines.  Thyroid  medicines.  Antiepileptic medicines.  Gonadal hormone suppression medicine.  Immunosuppression medicine.  Being underweight.  Lack of physical activity.  Lack of exposure to the sun. This can lead to vitamin D deficiency.  Certain medical conditions:  Certain inflammatory bowel diseases, such as Crohn's disease and ulcerative colitis.  Diabetes.  Hyperthyroidism.  Hyperparathyroidism. RISK FACTORS Anyone can develop osteoporosis. However, the following factors can increase your risk of developing osteoporosis:  Gender Women are at higher risk than men.  Age Being older than 50 years increases your risk.  Ethnicity White and Asian people have an increased risk.  Weight Being extremely underweight can increase your risk of osteoporosis.  Family history of osteoporosis Having a family member who has developed osteoporosis can increase your risk. SYMPTOMS  Usually, people with osteoporosis have no symptoms.  DIAGNOSIS  Signs during a physical exam that may prompt your caregiver to suspect osteoporosis include:  Decreased height. This is usually caused by the compression of the bones that form your spine (vertebrae) because they have weakened and become fractured.  A curving or rounding of the upper back (kyphosis). To confirm signs of osteoporosis, your caregiver may request a procedure that uses 2 low-dose X-ray beams with different levels of energy to measure your bone mineral density (dual-energy X-ray absorptiometry [DXA]). Also, your caregiver may check your level of vitamin D. TREATMENT  The goal of osteoporosis treatment is to strengthen bones in order to decrease the  risk of bone fractures. There are different types of medicines available to help achieve this goal. Some of these medicines work by slowing the processes of bone loss. Some medicines work by increasing bone density. Treatment also involves making sure that your levels of calcium and vitamin D are  adequate. PREVENTION  There are things you can do to help prevent osteoporosis. Adequate intake of calcium and vitamin D can help you achieve optimal bone mineral density. Regular exercise can also help, especially resistance and high-impact activities. If you smoke, quitting smoking is an important part of osteoporosis prevention. MAKE SURE YOU:  Understand these instructions.  Will watch your condition.  Will get help right away if you are not doing well or get worse. Document Released: 01/25/2005 Document Revised: 07/10/2011 Document Reviewed: 04/01/2011 [facility] Patient Information 2013 [place], [place].

## 2012-09-09 NOTE — Progress Notes (Signed)
Rachel Morris Sep 20, 1930 [id number]   History:    79 y.o. with history of cystocele and small rectocele and uterine dissension currently with a ring pessary presents to the office as a new patient. She was seen another provider in [place]. Patient has refused any type of surgery. She stated that her specimen has helped her and has minimal if any incontinence. She takes about every 3 months and cleans herself. She did this one month ago. Her last bone density study was in 2013 her lowest T score was in left femoral neck -2.5 she is on no treatment at the present time. Patient would know prior history of abnormal Pap smear. Her last mammogram was in 2011 which was normal. Her colonoscopy was done by Dr. Loreta Ave 5 years ago which was normal. Patient states that her shingles vaccine and Pneumovax vaccine are all up-to-date.   Past medical history,surgical history, family history and social history were all reviewed and documented in the EPIC chart.  Gynecologic History No LMP recorded. Patient is postmenopausal. Contraception: post menopausal status Last Pap: 2012. Results were: normal Last mammogram: 2011. Results were: normal  Obstetric History OB History   Grav Para Term Preterm Abortions TAB SAB Ect Mult Living   2 2        2      # Outc Date GA Lbr Len/2nd Wgt Sex Del Anes PTL Lv   1 PAR            2 PAR                ROS: A ROS was performed and pertinent positives and negatives are included in the history.  GENERAL: No fevers or chills. HEENT: No change in vision, no earache, sore throat or sinus congestion. NECK: No pain or stiffness. CARDIOVASCULAR: No chest pain or pressure. No palpitations. PULMONARY: No shortness of breath, cough or wheeze. GASTROINTESTINAL: No abdominal pain, nausea, vomiting or diarrhea, melena or bright red blood per rectum. GENITOURINARY: No urinary frequency, urgency, hesitancy or dysuria. MUSCULOSKELETAL: No joint or muscle pain, no back pain, no recent  trauma. DERMATOLOGIC: No rash, no itching, no lesions. ENDOCRINE: No polyuria, polydipsia, no heat or cold intolerance. No recent change in weight. HEMATOLOGICAL: No anemia or easy bruising or bleeding. NEUROLOGIC: No headache, seizures, numbness, tingling or weakness. PSYCHIATRIC: No depression, no loss of interest in normal activity or change in sleep pattern.     Exam: chaperone present  BP 120/72  Ht 5\' 3"  (1.6 m)  Wt 136 lb (61.689 kg)  BMI 24.1 kg/m2  Body mass index is 24.1 kg/(m^2).  General appearance : Well developed well nourished female. No acute distress HEENT: Neck supple, trachea midline, no carotid bruits, no thyroidmegaly Lungs: Clear to auscultation, no rhonchi or wheezes, or rib retractions  Heart: Regular rate and rhythm, no murmurs or gallops Breast:Examined in sitting and supine position were symmetrical in appearance, no palpable masses or tenderness,  no skin retraction, no nipple inversion, no nipple discharge, no skin discoloration, no axillary or supraclavicular lymphadenopathy Abdomen: no palpable masses or tenderness, no rebound or guarding Extremities: no edema or skin discoloration or tenderness  Pelvic:  Bartholin, Urethra, Skene Glands: Within normal limits             Vagina: No gross lesions or discharge,second-degree cystocele first-degree rectocele   Cervix: No gross lesions or discharge  Uterus  axial, normal size, shape and consistency, non-tender and mobile, first degree descensus  Adnexa  Without masses or  tenderness  Anus and perineum  normal   Rectovaginal  normal sphincter tone without palpated masses or tenderness             Hemoccult card provided     Assessment/Plan:  79 y.o. female new patient to the practice who has a second-degree cystocele, first-degree rectocele, first-degree uterine descensus using a ring pessary which has worked well for her which she claims every 3 months. We cleaned it today and replaced that after inspection  her vagina whereby no lesions or irritation was noted. We discussed the findings on her bone density study showing that her left femoral neck was -2.5. Her bone mineral density is 25% with low normal and she has an 8 times greater risk a spinal fracture and 11 times greater risk of a hip fracture in comparison to the general population. We discussed different treatment options she would best be served with Prolia 60 mg subcutaneous every 6 months. We're going to check her BUN, creatinine, calcium vitamin D level today and then begin injections. We discussed potential long-term use if she could lead to osteonecrosis of the jaw and subtrochanteric fractures. We'll keep her on it for 5-6 years. We discussed importance of calcium and vitamin D and regular exercise. No Pap smear done today the new guidelines were discussed. She was given a requisition to schedule her mammogram. She was reminded to submit to the office the Hemoccult cards for testing.    Ok Edwards MD, 3:53 PM 09/09/2012

## 2012-09-13 ENCOUNTER — Other Ambulatory Visit: Payer: Self-pay | Admitting: Anesthesiology

## 2012-09-13 DIAGNOSIS — Z1211 Encounter for screening for malignant neoplasm of colon: Secondary | ICD-10-CM

## 2012-09-16 ENCOUNTER — Other Ambulatory Visit: Payer: Self-pay

## 2012-09-16 DIAGNOSIS — Z1231 Encounter for screening mammogram for malignant neoplasm of breast: Secondary | ICD-10-CM

## 2012-09-17 ENCOUNTER — Telehealth: Payer: Self-pay | Admitting: *Deleted

## 2012-09-17 NOTE — Telephone Encounter (Signed)
Pt was called to inform her on Prolia benefits. The pt has a 4% co insurance. The patient wants to proceed. She wanted to set up a consult for July 2014 to discuss with Dr Lily Peer. Prolia ordered for pt. KW

## 2012-09-20 ENCOUNTER — Telehealth: Payer: Self-pay | Admitting: *Deleted

## 2012-09-20 NOTE — Telephone Encounter (Signed)
Pt called front desk requesting if she could have her dexa done prior to her consultation appointment on 10/31/12 to discuss prolia. Please advise

## 2012-09-20 NOTE — Telephone Encounter (Signed)
That will be fine but she had a bone density study one year ago that showed she had osteoporosis. I am not sure if her Medicare will cover it since it's only been 1 year, when it is recommended every 2 years unless someone has been started on treatment.

## 2012-09-20 NOTE — Telephone Encounter (Signed)
Pt was confused with the year, she will have consult visit and go from there.

## 2012-09-24 ENCOUNTER — Other Ambulatory Visit: Payer: Self-pay | Admitting: *Deleted

## 2012-09-24 ENCOUNTER — Other Ambulatory Visit: Payer: Medicare Other

## 2012-09-24 ENCOUNTER — Telehealth: Payer: Self-pay | Admitting: *Deleted

## 2012-09-24 DIAGNOSIS — E785 Hyperlipidemia, unspecified: Secondary | ICD-10-CM

## 2012-09-24 DIAGNOSIS — M949 Disorder of cartilage, unspecified: Secondary | ICD-10-CM

## 2012-09-24 DIAGNOSIS — M899 Disorder of bone, unspecified: Secondary | ICD-10-CM

## 2012-09-24 DIAGNOSIS — L821 Other seborrheic keratosis: Secondary | ICD-10-CM | POA: Diagnosis not present

## 2012-09-24 DIAGNOSIS — I10 Essential (primary) hypertension: Secondary | ICD-10-CM

## 2012-09-24 DIAGNOSIS — D239 Other benign neoplasm of skin, unspecified: Secondary | ICD-10-CM | POA: Diagnosis not present

## 2012-09-24 DIAGNOSIS — Z85828 Personal history of other malignant neoplasm of skin: Secondary | ICD-10-CM | POA: Diagnosis not present

## 2012-09-24 DIAGNOSIS — L919 Hypertrophic disorder of the skin, unspecified: Secondary | ICD-10-CM | POA: Diagnosis not present

## 2012-09-24 DIAGNOSIS — L723 Sebaceous cyst: Secondary | ICD-10-CM | POA: Diagnosis not present

## 2012-09-24 NOTE — Telephone Encounter (Signed)
Addendum note to scan document on 07/08/2012 Exercise Myocardial Perfusion Study Report.   08/01/2012--Spoke with Dr. Allyne Gee stated Dr. Glade Lloyd ordered and to leave with Dr. Glade Lloyd to review Tuesday. Called and told daughter I would notify her of results on Tuesday and she agrees.  Per Dr. Rondell Reams 08/06/2912: Normal imaging result. No signs of ischemia. Further result summary form Dr. Renato Gails on review.  08/06/2012--Patient Daughter Notified, Leslie# [phone]. Notified Patient and mailed copy to her home.

## 2012-09-25 LAB — BASIC METABOLIC PANEL
BUN/Creatinine Ratio: 22 (ref 11–26)
BUN: 15 mg/dL (ref 8–27)
CO2: 22 mmol/L (ref 19–28)
Calcium: 10 mg/dL (ref 8.6–10.2)
Chloride: 105 mmol/L (ref 97–108)
Creatinine, Ser: 0.69 mg/dL (ref 0.57–1.00)
GFR calc Af Amer: 96 mL/min/{1.73_m2} (ref >59)
GFR calc non Af Amer: 83 mL/min/{1.73_m2} (ref >59)
Glucose: 94 mg/dL (ref 65–99)
Potassium: 4.4 mmol/L (ref 3.5–5.2)
Sodium: 140 mmol/L (ref 134–144)

## 2012-09-25 LAB — CBC WITH DIFFERENTIAL/PLATELET
Basophils Absolute: 0.1 10*3/uL (ref 0.0–0.2)
Basos: 1 % (ref 0–3)
Eos: 5 % (ref 0–5)
Eosinophils Absolute: 0.3 10*3/uL (ref 0.0–0.4)
HCT: 41.6 % (ref 34.0–46.6)
Hemoglobin: 13.5 g/dL (ref 11.1–15.9)
Immature Grans (Abs): 0 10*3/uL (ref 0.0–0.1)
Immature Granulocytes: 0 % (ref 0–2)
Lymphocytes Absolute: 2.3 10*3/uL (ref 0.7–3.1)
Lymphs: 33 % (ref 14–46)
MCH: 30.8 pg (ref 26.6–33.0)
MCHC: 32.5 g/dL (ref 31.5–35.7)
MCV: 95 fL (ref 79–97)
Monocytes Absolute: 0.6 10*3/uL (ref 0.1–0.9)
Monocytes: 9 % (ref 4–12)
Neutrophils Absolute: 3.5 10*3/uL (ref 1.4–7.0)
Neutrophils Relative %: 52 % (ref 40–74)
RBC: 4.38 x10E6/uL (ref 3.77–5.28)
RDW: 13.8 % (ref 12.3–15.4)
WBC: 6.8 10*3/uL (ref 3.4–10.8)

## 2012-09-25 LAB — LIPID PANEL
Chol/HDL Ratio: 3.4 ratio units (ref 0.0–4.4)
Cholesterol, Total: 239 mg/dL — ABNORMAL HIGH (ref 100–199)
HDL: 71 mg/dL (ref >39)
LDL Calculated: 150 mg/dL — ABNORMAL HIGH (ref 0–99)
Triglycerides: 89 mg/dL (ref 0–149)
VLDL Cholesterol Cal: 18 mg/dL (ref 5–40)

## 2012-09-27 ENCOUNTER — Encounter: Payer: Self-pay | Admitting: *Deleted

## 2012-09-30 ENCOUNTER — Encounter: Payer: Self-pay | Admitting: Internal Medicine

## 2012-09-30 ENCOUNTER — Ambulatory Visit (INDEPENDENT_AMBULATORY_CARE_PROVIDER_SITE_OTHER): Payer: Medicare Other | Admitting: Internal Medicine

## 2012-09-30 VITALS — BP 112/78 | HR 88 | Temp 98.0°F | Resp 16 | Ht 63.0 in | Wt 135.0 lb

## 2012-09-30 DIAGNOSIS — N814 Uterovaginal prolapse, unspecified: Secondary | ICD-10-CM

## 2012-09-30 DIAGNOSIS — M810 Age-related osteoporosis without current pathological fracture: Secondary | ICD-10-CM | POA: Insufficient documentation

## 2012-09-30 DIAGNOSIS — K2100 Gastro-esophageal reflux disease with esophagitis, without bleeding: Secondary | ICD-10-CM | POA: Insufficient documentation

## 2012-09-30 DIAGNOSIS — N8110 Cystocele, unspecified: Secondary | ICD-10-CM

## 2012-09-30 DIAGNOSIS — M129 Arthropathy, unspecified: Secondary | ICD-10-CM

## 2012-09-30 DIAGNOSIS — E785 Hyperlipidemia, unspecified: Secondary | ICD-10-CM | POA: Insufficient documentation

## 2012-09-30 DIAGNOSIS — M1990 Unspecified osteoarthritis, unspecified site: Secondary | ICD-10-CM | POA: Insufficient documentation

## 2012-09-30 DIAGNOSIS — J301 Allergic rhinitis due to pollen: Secondary | ICD-10-CM

## 2012-09-30 DIAGNOSIS — N8111 Cystocele, midline: Secondary | ICD-10-CM

## 2012-09-30 NOTE — Progress Notes (Signed)
Patient ID: Rachel Morris, female   DOB: [birth year], 79 y.o.   MRN: [id number] Code Status:  DNR, dtr [name] is HCPOA  Allergies  Allergen Reactions  . Augmentin (Amoxicillin-Pot Clavulanate)   . Brandy (Alcohol)   . Omnicef (Cefdinir)   . Alprazolam Rash  . Ceftin (Cefuroxime Axetil) Rash  . Ciprofloxacin Rash  . Doxycycline Rash  . Eryc (Erythromycin) Rash  . Penicillins Rash    Chief Complaint  Patient presents with  . Annual Exam    no new problems    HPI: Patient is a 79 y.o. white female seen in the office today for annual physical.    Reviewed LDL elevated.  HDL is good.    Has pessary and had been managing by herself.  Daughter had PA in Dr. Fontaine No office.  He started questioning her osteoporosis.  He was looking at 2011 and 2013 scans, and recommended a bisphosphonate or alternate and she is adamantly against taking this based on what she's read.  He had proposed prolia.  Has fear about an injection for medication due to previous anaphylaxis with food.  Does water aerobics.  Encouraged walking.  Liked her PT for her low back pain at [facility]  Sometimes almost pain free except when does aggressive work like gardening outside.   Has worked on her own on the exercise.  Does use weights in her aquacise class.  Also has great social network.  Has not walked since she lost her dog.    Does not take calcium.  Takes vitamin D.  Vitamin D was tested.  Eats good calcium.  Does not go in sun much b/c of skin cancer risk.  Got blood blisters from sun when young.    Allergies have not been too bad this year.    Review of Systems:  Review of Systems  Constitutional: Negative for fever, chills, weight loss and malaise/fatigue.  HENT: Negative for congestion.   Eyes: Negative for blurred vision.       Sees Dr. Dione Booze, had cataracts removed and has not required new prescription at last appointment  Respiratory: Negative for cough and shortness of breath.   Cardiovascular:  Negative for chest pain and palpitations.  Gastrointestinal: Negative for abdominal pain, constipation, blood in stool and melena.  Genitourinary: Negative for dysuria, urgency and frequency.  Musculoskeletal: Positive for back pain.       Right sacroiliac pain off and on  Skin:       Some irritation of right ear that comes and goes  Neurological: Negative for dizziness, sensory change, focal weakness, weakness and headaches.  Endo/Heme/Allergies: Positive for environmental allergies.  Psychiatric/Behavioral: Negative for depression and memory loss.       Some word finding difficulty (seems anxiety related)    Past Medical History  Diagnosis Date  . Lyme borreliosis   . Arthritis     RIGHT LEG  . Cancer     FACE/NOSE -SKIN  . Hyperlipidemia LDL goal < 100   . Reflux esophagitis   . Anxiety state, unspecified   . Allergic rhinitis due to pollen   . Cystocele, midline   . Other atopic dermatitis and related conditions   . Lumbago   . Disorder of bone and cartilage, unspecified   . Other dysphagia   . Senile osteoporosis    Past Surgical History  Procedure Laterality Date  . Hernia repair Bilateral   . Appendectomy  1950   Social History:   reports that she has never  smoked. She does not have any smokeless tobacco history on file. She reports that  drinks alcohol. Her drug history is not on file.  No family history on file.  Medications: Patient's Medications  New Prescriptions   No medications on file  Previous Medications   CETIRIZINE (ZYRTEC) 10 MG TABLET    Take 10 mg by mouth daily.   CLOBETASOL OINTMENT (TEMOVATE) 0.05 %       ESTRADIOL 10 MCG TABS    Place 1 tablet (10 mcg total) vaginally 2 (two) times a week.   FEXOFENADINE (ALLEGRA) 30 MG TABLET    Take 30 mg by mouth daily.   HYDROXYZINE (ATARAX/VISTARIL) 10 MG TABLET    Take 10 mg by mouth 3 (three) times daily as needed for itching.  Modified Medications   No medications on file  Discontinued Medications    FLUOCINONIDE (LIDEX) 0.05 % EXTERNAL SOLUTION    Apply topically 2 (two) times daily.   SULFAMETHOXAZOLE-TRIMETHOPRIM (BACTRIM DS,SEPTRA DS) 800-160 MG PER TABLET    Take 1 tablet by mouth 2 (two) times daily.   Physical Exam:  Filed Vitals:   09/30/12 1037  BP: 112/78  Pulse: 88  Temp: 98 F (36.7 C)  TempSrc: Oral  Resp: 16  Height: 5\' 3"  (1.6 m)  Weight: 135 lb (61.236 kg)  SpO2: 98%  Physical Exam  Constitutional: She is oriented to person, place, and time. She appears well-developed and well-nourished. No distress.  Slim white female  HENT:  Head: Normocephalic and atraumatic.  Right Ear: External ear normal.  Left Ear: External ear normal.  Nose: Nose normal.  Mouth/Throat: Oropharynx is clear and moist. No oropharyngeal exudate.  Eyes: EOM are normal. Pupils are equal, round, and reactive to light. No scleral icterus.  Bilateral pterygium  Neck: Normal range of motion. Neck supple. No JVD present. No thyromegaly present.  Cardiovascular: Normal rate, regular rhythm, normal heart sounds and intact distal pulses.  Exam reveals no gallop and no friction rub.   No murmur heard. Pulmonary/Chest: Effort normal and breath sounds normal. No respiratory distress. She has no wheezes. She has no rales.  Abdominal: Soft. Bowel sounds are normal. She exhibits no distension and no mass. There is no tenderness. No hernia.  Neurological: She is alert and oriented to person, place, and time. She has normal reflexes. She displays normal reflexes. No cranial nerve deficit.  Skin: Skin is warm and dry. No rash noted.  Psychiatric: She has a normal mood and affect. Her behavior is normal. Judgment and thought content normal.  Slightly anxious  please note that breast exam and DRE with hemoccult were done at gyn and hemoccult was negative for blood  Labs reviewed: Basic Metabolic Panel:  Recent Labs  16/10/96 1149 09/24/12 0853  NA  --  140  K  --  4.4  CL  --  105  CO2  --  22   GLUCOSE  --  94  BUN 18 15  CREATININE 0.65 0.69  CALCIUM 10.2 10.0  CBC:  Recent Labs  09/24/12 0853  WBC 6.8  NEUTROABS 3.5  HGB 13.5  HCT 41.6  MCV 95   Lipid Panel:  Recent Labs  09/24/12 0853  HDL 71  LDLCALC 150*  TRIG 89  CHOLHDL 3.4   Past Procedures: Reviewed mammogram from last year, dexa from 2013 and pt given copy  Assessment/Plan Cystocele with small rectocele and uterine descent Seeing Dr. Lily Peer annually now for pessary change.  Stable.  Senile osteoporosis Refuses  medication therapy for this.  Is afraid of infusions and has read about bisphosphonates and prolia.  Is on vitamin D supplements and level when checked at gyn was wnl.  Continue adequate dairy in diet and some sun exposure regularly.  Also extensively discussed weightbearing exercise.    Reflux esophagitis Stable.  No related complaints today.    Hyperlipidemia LDL goal < 100 Is above goal but HDL is in 70s which is protective. She does not want to take medication for this either and is quite active and eats a healthy diet.     Labs/tests ordered:  None today

## 2012-09-30 NOTE — Assessment & Plan Note (Signed)
Seeing Dr. Lily Peer annually now for pessary change.  Stable.

## 2012-09-30 NOTE — Assessment & Plan Note (Signed)
Stable.  No related complaints today.

## 2012-09-30 NOTE — Assessment & Plan Note (Signed)
Is above goal but HDL is in 70s which is protective. She does not want to take medication for this either and is quite active and eats a healthy diet.

## 2012-09-30 NOTE — Assessment & Plan Note (Signed)
Refuses medication therapy for this.  Is afraid of infusions and has read about bisphosphonates and prolia.  Is on vitamin D supplements and level when checked at gyn was wnl.  Continue adequate dairy in diet and some sun exposure regularly.  Also extensively discussed weightbearing exercise.

## 2012-10-17 ENCOUNTER — Ambulatory Visit
Admission: RE | Admit: 2012-10-17 | Discharge: 2012-10-17 | Disposition: A | Discharge status: Home / Self Care | Payer: Medicare Other | Source: Ambulatory Visit

## 2012-10-17 DIAGNOSIS — Z1231 Encounter for screening mammogram for malignant neoplasm of breast: Secondary | ICD-10-CM

## 2012-10-30 ENCOUNTER — Encounter: Payer: Self-pay | Admitting: Geriatric Medicine

## 2012-10-30 ENCOUNTER — Encounter: Payer: Self-pay | Admitting: Nurse Practitioner

## 2012-10-30 ENCOUNTER — Ambulatory Visit (INDEPENDENT_AMBULATORY_CARE_PROVIDER_SITE_OTHER): Payer: Medicare Other | Admitting: Nurse Practitioner

## 2012-10-30 VITALS — BP 116/62 | HR 80 | Temp 98.3°F | Resp 20 | Ht 64.0 in | Wt 136.0 lb

## 2012-10-30 DIAGNOSIS — J00 Acute nasopharyngitis [common cold]: Secondary | ICD-10-CM | POA: Diagnosis not present

## 2012-10-30 NOTE — Progress Notes (Signed)
Patient ID: Rachel Morris, female   DOB: 06/17/1930, 79 y.o.   MRN: [id number]   Allergies  Allergen Reactions  . Augmentin (Amoxicillin-Pot Clavulanate)   . Brandy (Alcohol)     prunes  . Omnicef (Cefdinir)   . Alprazolam Rash  . Ceftin (Cefuroxime Axetil) Rash  . Ciprofloxacin Rash  . Doxycycline Rash  . Eryc (Erythromycin) Rash  . Penicillins Rash    Chief Complaint  Patient presents with  . Acute Visit    cold/fever  101.0 temp this morning    HPI: Patient is a 79 y.o. female seen in the office today for temp of 101 this morning Pt went on a cruise and on a long flight back; she got back on Sunday; Sunday night she noticed a sore throat which has improved but now she feels achy with nausea; some diarrhea and a cough with clear mucous and chest and nasal congestion which has gone on for 24 hours  Moderate appetite. Able to keep food down and drinking water Reports she feels like her symtoms are were getting worse with temp this morning but she has been afebrile since.   Review of Systems:  Review of Systems  Constitutional: Positive for fever. Negative for chills and malaise/fatigue.  Respiratory: Positive for cough. Negative for hemoptysis, sputum production, shortness of breath and wheezing.   Cardiovascular: Negative for chest pain and palpitations.  Gastrointestinal: Negative for heartburn, nausea, vomiting, abdominal pain, diarrhea and constipation.  Genitourinary: Negative for dysuria, urgency and frequency.  Musculoskeletal: Positive for myalgias.  Skin: Negative.   Neurological: Negative for weakness.     Past Medical History  Diagnosis Date  . Lyme borreliosis   . Arthritis     RIGHT LEG  . Cancer     FACE/NOSE -SKIN  . Hyperlipidemia LDL goal < 100   . Reflux esophagitis   . Anxiety state, unspecified   . Allergic rhinitis due to pollen   . Cystocele, midline   . Other atopic dermatitis and related conditions   . Lumbago   . Disorder of bone and  cartilage, unspecified   . Other dysphagia   . Senile osteoporosis    Past Surgical History  Procedure Laterality Date  . Hernia repair Bilateral   . Appendectomy  1950   Social History:   reports that she has never smoked. She does not have any smokeless tobacco history on file. She reports that  drinks alcohol. Her drug history is not on file.  No family history on file.  Medications: Patient's Medications  New Prescriptions   No medications on file  Previous Medications   CETIRIZINE (ZYRTEC) 10 MG TABLET    Take 10 mg by mouth daily.   CLOBETASOL OINTMENT (TEMOVATE) 0.05 %       ESTRADIOL 10 MCG TABS    Place 1 tablet (10 mcg total) vaginally 2 (two) times a week.   HYDROXYZINE (ATARAX/VISTARIL) 10 MG TABLET    Take 10 mg by mouth 3 (three) times daily as needed for itching.   PROBIOTIC PRODUCT (SOLUBLE FIBER/PROBIOTICS PO)    Take by mouth daily.  Modified Medications   No medications on file  Discontinued Medications   FEXOFENADINE (ALLEGRA) 30 MG TABLET    Take 30 mg by mouth daily.     Physical Exam:  Filed Vitals:   10/30/12 1452  BP: 116/62  Pulse: 80  Temp: 98.3 F (36.8 C)  TempSrc: Oral  Resp: 20  Height: 5\' 4"  (1.626 m)  Weight:  136 lb (61.689 kg)  SpO2: 97%    Physical Exam  Constitutional: She is oriented to person, place, and time and well-developed, well-nourished, and in no distress. Vital signs are normal. No distress.  HENT:  Head: Normocephalic and atraumatic.  Right Ear: Tympanic membrane, external ear and ear canal normal.  Left Ear: Tympanic membrane, external ear and ear canal normal.  Nose: Nose normal.  Mouth/Throat: Oropharynx is clear and moist. No oropharyngeal exudate.  Eyes: Conjunctivae and EOM are normal. Pupils are equal, round, and reactive to light.  Neck: Normal range of motion. Neck supple. No tracheal deviation present. No thyromegaly present.  Cardiovascular: Normal rate, regular rhythm and normal heart sounds.    Pulmonary/Chest: Effort normal and breath sounds normal. No respiratory distress.  Abdominal: Soft. Bowel sounds are normal. She exhibits no distension. There is no tenderness.  Musculoskeletal: Normal range of motion. She exhibits no edema and no tenderness.  Lymphadenopathy:    She has no cervical adenopathy.  Neurological: She is alert and oriented to person, place, and time.  Skin: Skin is warm and dry. She is not diaphoretic.  Psychiatric: Affect normal.     Labs reviewed: Basic Metabolic Panel:  Recent Labs  14/78/29 1149 09/24/12 0853  NA  --  140  K  --  4.4  CL  --  105  CO2  --  22  GLUCOSE  --  94  BUN 18 15  CREATININE 0.65 0.69  CALCIUM 10.2 10.0   Liver Function Tests: No results found for this basename: AST, ALT, ALKPHOS, BILITOT, PROT, ALBUMIN,  in the last 8760 hours No results found for this basename: LIPASE, AMYLASE,  in the last 8760 hours No results found for this basename: AMMONIA,  in the last 8760 hours CBC:  Recent Labs  09/24/12 0853  WBC 6.8  NEUTROABS 3.5  HGB 13.5  HCT 41.6  MCV 95   Lipid Panel:  Recent Labs  09/24/12 0853  HDL 71  LDLCALC 150*  TRIG 89  CHOLHDL 3.4     Assessment/Plan  Acute nasopharyngitis- supportive care at this time; should not last more than a few days-- cont increase fluids and advanced diet at tolerated. To not over exert herself; rest while feeling malaise  To take mucinex DM 1 tablet q 12 for 7 days for cough and congestion Drink plenty of fluids, proper nutrition and rest  Follow up or go to urgent care if symptoms worsen, on-going fevers, shortness of breath, worsening cough and congestion

## 2012-10-30 NOTE — Patient Instructions (Addendum)
To take mucinex DM 1 tablet q 12 for 7 days for cough and congestion Drink plenty of fluids, proper nutrition and rest  Follow up or go to urgent care if symptoms worsen

## 2012-10-31 ENCOUNTER — Institutional Professional Consult (permissible substitution): Payer: Medicare Other | Admitting: Gynecology

## 2012-11-09 ENCOUNTER — Ambulatory Visit (INDEPENDENT_AMBULATORY_CARE_PROVIDER_SITE_OTHER): Payer: Medicare Other | Admitting: Family Medicine

## 2012-11-09 VITALS — BP 136/62 | HR 88 | Temp 97.7°F | Resp 16 | Ht 64.0 in | Wt 135.8 lb

## 2012-11-09 DIAGNOSIS — R300 Dysuria: Secondary | ICD-10-CM

## 2012-11-09 DIAGNOSIS — R42 Dizziness and giddiness: Secondary | ICD-10-CM

## 2012-11-09 DIAGNOSIS — H6980 Other specified disorders of Eustachian tube, unspecified ear: Secondary | ICD-10-CM

## 2012-11-09 DIAGNOSIS — H6981 Other specified disorders of Eustachian tube, right ear: Secondary | ICD-10-CM

## 2012-11-09 DIAGNOSIS — N390 Urinary tract infection, site not specified: Secondary | ICD-10-CM

## 2012-11-09 DIAGNOSIS — J069 Acute upper respiratory infection, unspecified: Secondary | ICD-10-CM

## 2012-11-09 LAB — POCT URINALYSIS DIPSTICK
Glucose, UA: NEGATIVE
Ketones, UA: NEGATIVE
Spec Grav, UA: 1.01

## 2012-11-09 LAB — POCT UA - MICROSCOPIC ONLY

## 2012-11-09 LAB — POCT CBC
Lymph, poc: 3.1 (ref 0.6–3.4)
MCH, POC: 30 pg (ref 27–31.2)
MCHC: 31.1 g/dL — AB (ref 31.8–35.4)
MID (cbc): 0.8 (ref 0–0.9)
MPV: 9.4 fL (ref 0–99.8)
POC MID %: 8.4 %M (ref 0–12)
Platelet Count, POC: 241 10*3/uL (ref 142–424)
WBC: 9.2 10*3/uL (ref 4.6–10.2)

## 2012-11-09 MED ORDER — SULFAMETHOXAZOLE-TMP DS 800-160 MG PO TABS: 1.0000 | ORAL_TABLET | Freq: Two times a day (BID) | ORAL | Status: DC

## 2012-11-09 MED ORDER — MOMETASONE FUROATE 50 MCG/ACT NA SUSP: 2.0000 | Freq: Every day | NASAL | Status: DC

## 2012-11-09 NOTE — Patient Instructions (Signed)
Saline nasal spray 4-5 times per day for congestion.  Can also try steroid nasal spray at night. Start antibiotic for bladder infection. Return to the clinic or go to the nearest emergency room if any of your symptoms worsen or new symptoms occur. Urinary Tract Infection Urinary tract infections (UTIs) can develop anywhere along your urinary tract. Your urinary tract is your body's drainage system for removing wastes and extra water. Your urinary tract includes two kidneys, two ureters, a bladder, and a urethra. Your kidneys are a pair of bean-shaped organs. Each kidney is about the size of your fist. They are located below your ribs, one on each side of your spine. CAUSES Infections are caused by microbes, which are microscopic organisms, including fungi, viruses, and bacteria. These organisms are so small that they can only be seen through a microscope. Bacteria are the microbes that most commonly cause UTIs. SYMPTOMS  Symptoms of UTIs may vary by age and gender of the patient and by the location of the infection. Symptoms in young women typically include a frequent and intense urge to urinate and a painful, burning feeling in the bladder or urethra during urination. Older women and men are more likely to be tired, shaky, and weak and have muscle aches and abdominal pain. A fever may mean the infection is in your kidneys. Other symptoms of a kidney infection include pain in your back or sides below the ribs, nausea, and vomiting. DIAGNOSIS To diagnose a UTI, your caregiver will ask you about your symptoms. Your caregiver also will ask to provide a urine sample. The urine sample will be tested for bacteria and white blood cells. White blood cells are made by your body to help fight infection. TREATMENT  Typically, UTIs can be treated with medication. Because most UTIs are caused by a bacterial infection, they usually can be treated with the use of antibiotics. The choice of antibiotic and length of  treatment depend on your symptoms and the type of bacteria causing your infection. HOME CARE INSTRUCTIONS  If you were prescribed antibiotics, take them exactly as your caregiver instructs you. Finish the medication even if you feel better after you have only taken some of the medication.  Drink enough water and fluids to keep your urine clear or pale yellow.  Avoid caffeine, tea, and carbonated beverages. They tend to irritate your bladder.  Empty your bladder often. Avoid holding urine for long periods of time.  Empty your bladder before and after sexual intercourse.  After a bowel movement, women should cleanse from front to back. Use each tissue only once. SEEK MEDICAL CARE IF:   You have back pain.  You develop a fever.  Your symptoms do not begin to resolve within 3 days. SEEK IMMEDIATE MEDICAL CARE IF:   You have severe back pain or lower abdominal pain.  You develop chills.  You have nausea or vomiting.  You have continued burning or discomfort with urination. MAKE SURE YOU:   Understand these instructions.  Will watch your condition.  Will get help right away if you are not doing well or get worse. Document Released: 01/25/2005 Document Revised: 10/17/2011 Document Reviewed: 05/26/2011 [facility] Patient Information 2014 [place], [place].   Dizziness Dizziness is a common problem. It is a feeling of unsteadiness or lightheadedness. You may feel like you are about to faint. Dizziness can lead to injury if you stumble or fall. A person of any age group can suffer from dizziness, but dizziness is more common in older  adults. CAUSES  Dizziness can be caused by many different things, including:  Middle ear problems.  Standing for too long.  Infections.  An allergic reaction.  Aging.  An emotional response to something, such as the sight of blood.  Side effects of medicines.  Fatigue.  Problems with circulation or blood pressure.  Excess use of alcohol,  medicines, or illegal drug use.  Breathing too fast (hyperventilation).  An arrhythmia or problems with your heart rhythm.  Low red blood cell count (anemia).  Pregnancy.  Vomiting, diarrhea, fever, or other illnesses that cause dehydration.  Diseases or conditions such as Parkinson's disease, high blood pressure (hypertension), diabetes, and thyroid problems.  Exposure to extreme heat. DIAGNOSIS  To find the cause of your dizziness, your caregiver may do a physical exam, lab tests, radiologic imaging scans, or an electrocardiography test (ECG).  TREATMENT  Treatment of dizziness depends on the cause of your symptoms and can vary greatly. HOME CARE INSTRUCTIONS   Drink enough fluids to keep your urine clear or pale yellow. This is especially important in very hot weather. In the elderly, it is also important in cold weather.  If your dizziness is caused by medicines, take them exactly as directed. When taking blood pressure medicines, it is especially important to get up slowly.  Rise slowly from chairs and steady yourself until you feel okay.  In the morning, first sit up on the side of the bed. When this seems okay, stand slowly while holding onto something until you know your balance is fine.  If you need to stand in one place for a long time, be sure to move your legs often. Tighten and relax the muscles in your legs while standing.  If dizziness continues to be a problem, have someone stay with you for a day or two. Do this until you feel you are well enough to stay alone. Have the person call your caregiver if he or she notices changes in you that are concerning.  Do not drive or use heavy machinery if you feel dizzy.  Do not drink alcohol. SEEK IMMEDIATE MEDICAL CARE IF:   Your dizziness or lightheadedness gets worse.  You feel nauseous or vomit.  You develop problems with talking, walking, weakness, or using your arms, hands, or legs.  You are not thinking clearly  or you have difficulty forming sentences. It may take a friend or family member to determine if your thinking is normal.  You develop chest pain, abdominal pain, shortness of breath, or sweating.  Your vision changes.  You notice any bleeding.  You have side effects from medicine that seems to be getting worse rather than better. MAKE SURE YOU:   Understand these instructions.  Will watch your condition.  Will get help right away if you are not doing well or get worse. Document Released: 10/11/2000 Document Revised: 07/10/2011 Document Reviewed: 11/04/2010 [facility] Patient Information 2014 [place], [place].

## 2012-11-09 NOTE — Progress Notes (Signed)
Subjective:    Patient ID: Rachel Morris, female    DOB: 01-17-1931, 79 y.o.   MRN: [id number]  HPI Rachel Morris is a 79 y.o. female   Came back from cruise 2 weeks ago - had sore throat, fever 101.  Seen by PCP - Dr. Azucena Kuba.  Told viral illness, no abx's.  Tried mucinex. Dizziness since coming off boat, but feels like dizziness is worse today. At noon today - soreness with urinating, but less on repeat urination. Here for eval.  No recent fever. Still having congestion, blocked ears.  Feels lightheaded for past few weeks with congestion. Overall cough is better but still congested. Yellow d/c form nose in am, clear during day.  Tx: no recent mucinex, on zyrtec past 2 nights. Salt water ns - once per day.   No chest pain/sob. No new palpitiations. No slurred speech, no headache (just sinus congestion), no focal weakness.    Review of Systems  Constitutional: Negative for fever (initially only) and chills.  HENT: Positive for ear pain.   Respiratory: Positive for cough. Negative for chest tightness and shortness of breath.   Neurological: Positive for dizziness (notes with head movement. ) and light-headedness. Negative for weakness and headaches.       Objective:   Physical Exam  Vitals reviewed. Constitutional: She is oriented to person, place, and time. She appears well-developed and well-nourished.  HENT:  Head: Normocephalic and atraumatic.  Right Ear: External ear and ear canal normal. No drainage. Tympanic membrane is not injected and not erythematous. A middle ear effusion (min clear fluid behind tm.) is present.  Left Ear: Tympanic membrane, external ear and ear canal normal.  Eyes: EOM are normal. Right eye exhibits no nystagmus. Left eye exhibits no nystagmus.  Pulmonary/Chest: Effort normal.  Abdominal: Soft. Normal appearance. She exhibits no distension. There is no tenderness. There is no rebound, no guarding and no CVA tenderness.  Neurological: She is alert and  oriented to person, place, and time. She has normal strength. No sensory deficit. She displays a negative Romberg sign.  No pronator drift, nonfocal. Normal heel to toe.   Skin: Skin is warm and dry. No rash noted.  Psychiatric: She has a normal mood and affect. Her behavior is normal.    Results for orders placed in visit on 11/09/12  POCT UA - MICROSCOPIC ONLY      Result Value Range   WBC, Ur, HPF, POC tntc     RBC, urine, microscopic tntc     Bacteria, U Microscopic 2+     Mucus, UA pos     Epithelial cells, urine per micros 3-4     Crystals, Ur, HPF, POC neg     Casts, Ur, LPF, POC neg     Yeast, UA neg    POCT URINALYSIS DIPSTICK      Result Value Range   Color, UA yellow     Clarity, UA cloudy     Glucose, UA neg     Bilirubin, UA neg     Ketones, UA neg     Spec Grav, UA 1.010     Blood, UA trace     pH, UA 6.0     Protein, UA neg     Urobilinogen, UA 0.2     Nitrite, UA neg     Leukocytes, UA moderate (2+)    POCT CBC      Result Value Range   WBC 9.2  4.6 - 10.2 K/uL  Lymph, poc 3.1  0.6 - 3.4   POC LYMPH PERCENT 33.4  10 - 50 %L   MID (cbc) 0.8  0 - 0.9   POC MID % 8.4  0 - 12 %M   POC Granulocyte 5.4  2 - 6.9   Granulocyte percent 58.2  37 - 80 %G   RBC 4.26  4.04 - 5.48 M/uL   Hemoglobin 12.8  12.2 - 16.2 g/dL   HCT, POC 16.1  09.6 - 47.9 %   MCV 96.5  80 - 97 fL   MCH, POC 30.0  27 - 31.2 pg   MCHC 31.1 (*) 31.8 - 35.4 g/dL   RDW, POC 04.5     Platelet Count, POC 241  142 - 424 K/uL   MPV 9.4  0 - 99.8 fL      EKG: NSR, no acute findings.     Assessment & Plan:  Rachel Morris is a 79 y.o. female Dysuria - Plan: POCT UA - Microscopic Only, POCT urinalysis dipstick  UTI (urinary tract infection) - Plan: Urine culture, sulfamethoxazole-trimethoprim (BACTRIM DS) 800-160 MG per tablet  Acute upper respiratory infections of unspecified site - Plan: POCT CBC, Basic metabolic panel, mometasone (NASONEX) 50 MCG/ACT nasal spray  Dizziness -  Plan: EKG 12-Lead, POCT CBC, Basic metabolic panel  ETD (eustachian tube dysfunction), right - Plan: mometasone (NASONEX) 50 MCG/ACT nasal spray   UTI - septra DS BID - tolerated this prior. Fluids, sx care, urine culture.   Dizziness - suspected sinus congestion, ETD cause. Increase saline nasal spray, add nasonex QHS. Maintain hydration - check BMP. Rtc/er precautions.   Meds ordered this encounter  Medications  . sulfamethoxazole-trimethoprim (BACTRIM DS) 800-160 MG per tablet    Sig: Take 1 tablet by mouth 2 (two) times daily.    Dispense:  10 tablet    Refill:  0  . mometasone (NASONEX) 50 MCG/ACT nasal spray    Sig: Place 2 sprays into the nose daily.    Dispense:  17 g    Refill:  0   Patient Instructions  Saline nasal spray 4-5 times per day for congestion.  Can also try steroid nasal spray at night. Start antibiotic for bladder infection. Return to the clinic or go to the nearest emergency room if any of your symptoms worsen or new symptoms occur. Urinary Tract Infection Urinary tract infections (UTIs) can develop anywhere along your urinary tract. Your urinary tract is your body's drainage system for removing wastes and extra water. Your urinary tract includes two kidneys, two ureters, a bladder, and a urethra. Your kidneys are a pair of bean-shaped organs. Each kidney is about the size of your fist. They are located below your ribs, one on each side of your spine. CAUSES Infections are caused by microbes, which are microscopic organisms, including fungi, viruses, and bacteria. These organisms are so small that they can only be seen through a microscope. Bacteria are the microbes that most commonly cause UTIs. SYMPTOMS  Symptoms of UTIs may vary by age and gender of the patient and by the location of the infection. Symptoms in young women typically include a frequent and intense urge to urinate and a painful, burning feeling in the bladder or urethra during urination. Older women  and men are more likely to be tired, shaky, and weak and have muscle aches and abdominal pain. A fever may mean the infection is in your kidneys. Other symptoms of a kidney infection include pain in your back  or sides below the ribs, nausea, and vomiting. DIAGNOSIS To diagnose a UTI, your caregiver will ask you about your symptoms. Your caregiver also will ask to provide a urine sample. The urine sample will be tested for bacteria and white blood cells. White blood cells are made by your body to help fight infection. TREATMENT  Typically, UTIs can be treated with medication. Because most UTIs are caused by a bacterial infection, they usually can be treated with the use of antibiotics. The choice of antibiotic and length of treatment depend on your symptoms and the type of bacteria causing your infection. HOME CARE INSTRUCTIONS  If you were prescribed antibiotics, take them exactly as your caregiver instructs you. Finish the medication even if you feel better after you have only taken some of the medication.  Drink enough water and fluids to keep your urine clear or pale yellow.  Avoid caffeine, tea, and carbonated beverages. They tend to irritate your bladder.  Empty your bladder often. Avoid holding urine for long periods of time.  Empty your bladder before and after sexual intercourse.  After a bowel movement, women should cleanse from front to back. Use each tissue only once. SEEK MEDICAL CARE IF:   You have back pain.  You develop a fever.  Your symptoms do not begin to resolve within 3 days. SEEK IMMEDIATE MEDICAL CARE IF:   You have severe back pain or lower abdominal pain.  You develop chills.  You have nausea or vomiting.  You have continued burning or discomfort with urination. MAKE SURE YOU:   Understand these instructions.  Will watch your condition.  Will get help right away if you are not doing well or get worse. Document Released: 01/25/2005 Document Revised:  10/17/2011 Document Reviewed: 05/26/2011 [facility] Patient Information 2014 [place], [place].   Dizziness Dizziness is a common problem. It is a feeling of unsteadiness or lightheadedness. You may feel like you are about to faint. Dizziness can lead to injury if you stumble or fall. A person of any age group can suffer from dizziness, but dizziness is more common in older adults. CAUSES  Dizziness can be caused by many different things, including:  Middle ear problems.  Standing for too long.  Infections.  An allergic reaction.  Aging.  An emotional response to something, such as the sight of blood.  Side effects of medicines.  Fatigue.  Problems with circulation or blood pressure.  Excess use of alcohol, medicines, or illegal drug use.  Breathing too fast (hyperventilation).  An arrhythmia or problems with your heart rhythm.  Low red blood cell count (anemia).  Pregnancy.  Vomiting, diarrhea, fever, or other illnesses that cause dehydration.  Diseases or conditions such as Parkinson's disease, high blood pressure (hypertension), diabetes, and thyroid problems.  Exposure to extreme heat. DIAGNOSIS  To find the cause of your dizziness, your caregiver may do a physical exam, lab tests, radiologic imaging scans, or an electrocardiography test (ECG).  TREATMENT  Treatment of dizziness depends on the cause of your symptoms and can vary greatly. HOME CARE INSTRUCTIONS   Drink enough fluids to keep your urine clear or pale yellow. This is especially important in very hot weather. In the elderly, it is also important in cold weather.  If your dizziness is caused by medicines, take them exactly as directed. When taking blood pressure medicines, it is especially important to get up slowly.  Rise slowly from chairs and steady yourself until you feel okay.  In the morning, first sit  up on the side of the bed. When this seems okay, stand slowly while holding onto something  until you know your balance is fine.  If you need to stand in one place for a long time, be sure to move your legs often. Tighten and relax the muscles in your legs while standing.  If dizziness continues to be a problem, have someone stay with you for a day or two. Do this until you feel you are well enough to stay alone. Have the person call your caregiver if he or she notices changes in you that are concerning.  Do not drive or use heavy machinery if you feel dizzy.  Do not drink alcohol. SEEK IMMEDIATE MEDICAL CARE IF:   Your dizziness or lightheadedness gets worse.  You feel nauseous or vomit.  You develop problems with talking, walking, weakness, or using your arms, hands, or legs.  You are not thinking clearly or you have difficulty forming sentences. It may take a friend or family member to determine if your thinking is normal.  You develop chest pain, abdominal pain, shortness of breath, or sweating.  Your vision changes.  You notice any bleeding.  You have side effects from medicine that seems to be getting worse rather than better. MAKE SURE YOU:   Understand these instructions.  Will watch your condition.  Will get help right away if you are not doing well or get worse. Document Released: 10/11/2000 Document Revised: 07/10/2011 Document Reviewed: 11/04/2010 [facility] Patient Information 2014 [place], [place].

## 2012-11-10 LAB — BASIC METABOLIC PANEL
CO2: 25 mEq/L (ref 19–32)
Calcium: 9.8 mg/dL (ref 8.4–10.5)
Creat: 0.68 mg/dL (ref 0.50–1.10)

## 2012-11-12 LAB — URINE CULTURE: Colony Count: >=100000

## 2012-11-14 ENCOUNTER — Ambulatory Visit (INDEPENDENT_AMBULATORY_CARE_PROVIDER_SITE_OTHER): Payer: Medicare Other | Admitting: Gynecology

## 2012-11-14 ENCOUNTER — Encounter: Payer: Self-pay | Admitting: Gynecology

## 2012-11-14 VITALS — BP 140/82

## 2012-11-14 DIAGNOSIS — M810 Age-related osteoporosis without current pathological fracture: Secondary | ICD-10-CM | POA: Diagnosis not present

## 2012-11-14 NOTE — Progress Notes (Signed)
The patient is a 79 year old who was seen in the office as a new patient 09/09/2012. Patient went known history of osteoporosis and uterine descensus as well a second-degree cystocele and first-degree rectocele who has been using a ring pessary. Patient has been reluctant to have surgical intervention.  Patient is a bone density study done in 2013 was evaluated and it was noted that patient's lowest T score was -2.5 and a left femoral neck. Patient had not been on any treatment and only takes vitamin D daily dosage?  Patient recently had a BUN, creatinine and calcium level which were normal and I have given her literature and information and counseled her on Prolia 60 mg subcutaneous Q6 months. Her bone mineral density is 25%  below normal and she has an 8 times greater risk a spinal fracture and 11 times greater risk of a hip fracture in comparison to the general population.   Patient stated that she would like to wait until next year to see the results of her bone density study because of concern she has a potential side effects with antiresorptive medications or monoclonal antibody. She had had bone density studies in the past done at 2 different facilities. We discussed the importance of having bone density studies done at the same facility for more accurate followup assessment.

## 2012-11-14 NOTE — Patient Instructions (Signed)
Bone Densitometry Bone densitometry is a special X-ray that measures your bone density and can be used to help predict your risk of bone fractures. This test is used to determine bone mineral content and density to diagnose osteoporosis. Osteoporosis is the loss of bone that may cause the bone to become weak. Osteoporosis commonly occurs in women entering menopause. However, it may be found in men and in people with other diseases. PREPARATION FOR TEST No preparation necessary. WHO SHOULD BE TESTED?  All women older than 48.  Postmenopausal women (50 to 72) with risk factors for osteoporosis.  People with a previous fracture caused by normal activities.  People with a small body frame (less than 127 poundsor a body mass index [BMI] of less than 21).  People who have a parent with a hip fracture or history of osteoporosis.  People who smoke.  People who have rheumatoid arthritis.  Anyone who engages in excessive alcohol use (more than 3 drinks most days).  Women who experience early menopause. WHEN SHOULD YOU BE RETESTED? Current guidelines suggest that you should wait at least 2 years before doing a bone density test again if your first test was normal.Recent studies indicated that women with normal bone density may be able to wait a few years before needing to repeat a bone density test. You should discuss this with your caregiver.  NORMAL FINDINGS   Normal: less than standard deviation below normal (greater than -1).  Osteopenia: 1 to 2.5 standard deviations below normal (-1 to -2.5).  Osteoporosis: greater than 2.5 standard deviations below normal (less than -2.5). Test results are reported as a "T score" and a "Z score."The T score is a number that compares your bone density with the bone density of healthy, young women.The Z score is a number that compares your bone density with the scores of women who are the same age, gender, and race.  Ranges for normal findings may vary  among different laboratories and hospitals. You should always check with your doctor after having lab work or other tests done to discuss the meaning of your test results and whether your values are considered within normal limits. MEANING OF TEST  Your caregiver will go over the test results with you and discuss the importance and meaning of your results, as well as treatment options and the need for additional tests if necessary. OBTAINING THE TEST RESULTS It is your responsibility to obtain your test results. Ask the lab or department performing the test when and how you will get your results. Document Released: 05/09/2004 Document Revised: 07/10/2011 Document Reviewed: 06/01/2010 [facility] Patient Information 2014 [place], [place]. Osteoporosis Throughout your life, your body breaks down old bone and replaces it with new bone. As you get older, your body does not replace bone as quickly as it breaks it down. By the age of 30 years, most people begin to gradually lose bone because of the imbalance between bone loss and replacement. Some people lose more bone than others. Bone loss beyond a specified normal degree is considered osteoporosis.  Osteoporosis affects the strength and durability of your bones. The inside of the ends of your bones and your flat bones, like the bones of your pelvis, look like honeycomb, filled with tiny open spaces. As bone loss occurs, your bones become less dense. This means that the open spaces inside your bones become bigger and the walls between these spaces become thinner. This makes your bones weaker. Bones of a person with osteoporosis can become  so weak that they can break (fracture) during minor accidents, such as a simple fall. CAUSES  The following factors have been associated with the development of osteoporosis:  Smoking.  Drinking more than 2 alcoholic drinks several days per week.  Long-term use of certain medicines:  Corticosteroids.  Chemotherapy  medicines.  Thyroid medicines.  Antiepileptic medicines.  Gonadal hormone suppression medicine.  Immunosuppression medicine.  Being underweight.  Lack of physical activity.  Lack of exposure to the sun. This can lead to vitamin D deficiency.  Certain medical conditions:  Certain inflammatory bowel diseases, such as Crohn's disease and ulcerative colitis.  Diabetes.  Hyperthyroidism.  Hyperparathyroidism. RISK FACTORS Anyone can develop osteoporosis. However, the following factors can increase your risk of developing osteoporosis:  Gender Women are at higher risk than men.  Age Being older than 50 years increases your risk.  Ethnicity White and Asian people have an increased risk.  Weight Being extremely underweight can increase your risk of osteoporosis.  Family history of osteoporosis Having a family member who has developed osteoporosis can increase your risk. SYMPTOMS  Usually, people with osteoporosis have no symptoms.  DIAGNOSIS  Signs during a physical exam that may prompt your caregiver to suspect osteoporosis include:  Decreased height. This is usually caused by the compression of the bones that form your spine (vertebrae) because they have weakened and become fractured.  A curving or rounding of the upper back (kyphosis). To confirm signs of osteoporosis, your caregiver may request a procedure that uses 2 low-dose X-ray beams with different levels of energy to measure your bone mineral density (dual-energy X-ray absorptiometry [DXA]). Also, your caregiver may check your level of vitamin D. TREATMENT  The goal of osteoporosis treatment is to strengthen bones in order to decrease the risk of bone fractures. There are different types of medicines available to help achieve this goal. Some of these medicines work by slowing the processes of bone loss. Some medicines work by increasing bone density. Treatment also involves making sure that your levels of calcium and  vitamin D are adequate. PREVENTION  There are things you can do to help prevent osteoporosis. Adequate intake of calcium and vitamin D can help you achieve optimal bone mineral density. Regular exercise can also help, especially resistance and weight-bearing activities. If you smoke, quitting smoking is an important part of osteoporosis prevention. MAKE SURE YOU:  Understand these instructions.  Will watch your condition.  Will get help right away if you are not doing well or get worse. Document Released: 01/25/2005 Document Revised: 04/03/2012 Document Reviewed: 04/01/2011 [facility] Patient Information 2014 [place], [place].

## 2013-02-06 ENCOUNTER — Ambulatory Visit (INDEPENDENT_AMBULATORY_CARE_PROVIDER_SITE_OTHER): Payer: Medicare Other

## 2013-02-06 DIAGNOSIS — Z23 Encounter for immunization: Secondary | ICD-10-CM | POA: Diagnosis not present

## 2013-03-18 IMAGING — CR DG THORACIC SPINE 3V
3 series · 3 of 3 positions shown · non-contrast
Comparison: None.

CLINICAL DATA: Mid to low back pain, no injury

THORACIC SPINE - 2 VIEW + SWIMMERS

[view not recorded (1 of 3)]
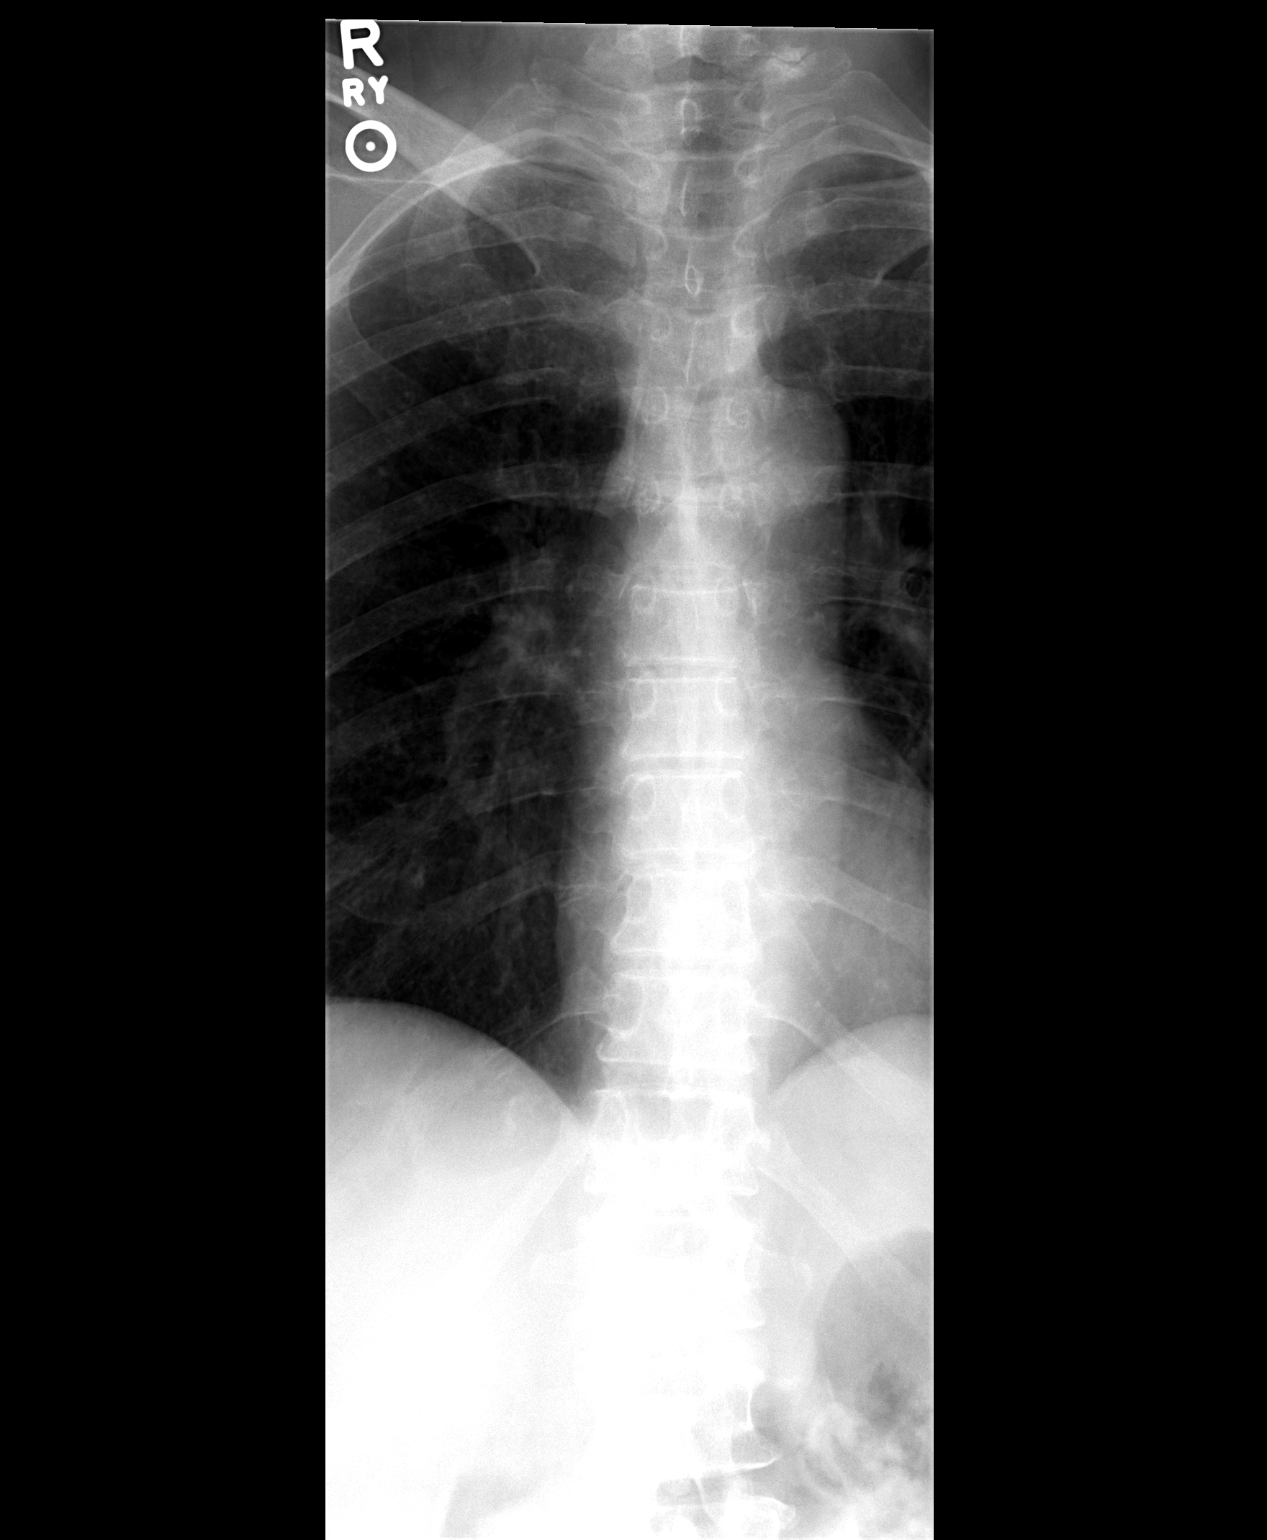

[view not recorded (2 of 3)]
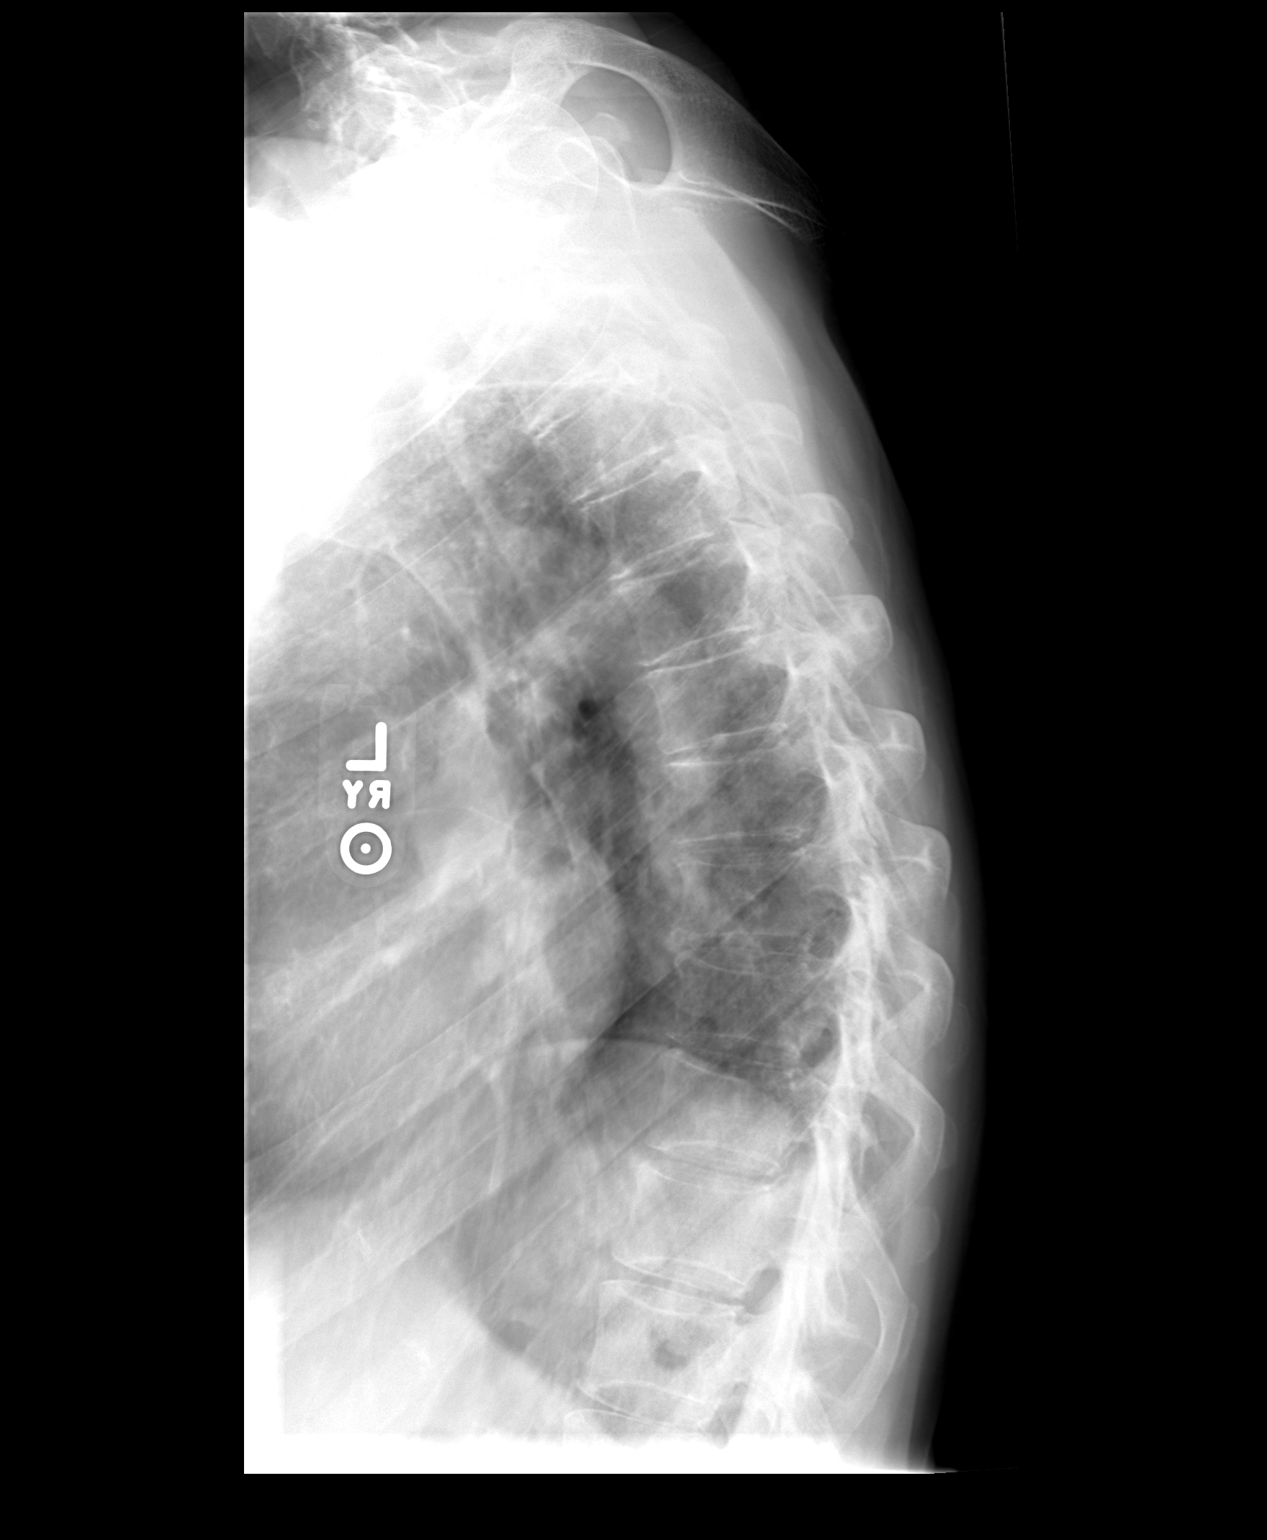

[view not recorded (3 of 3)]
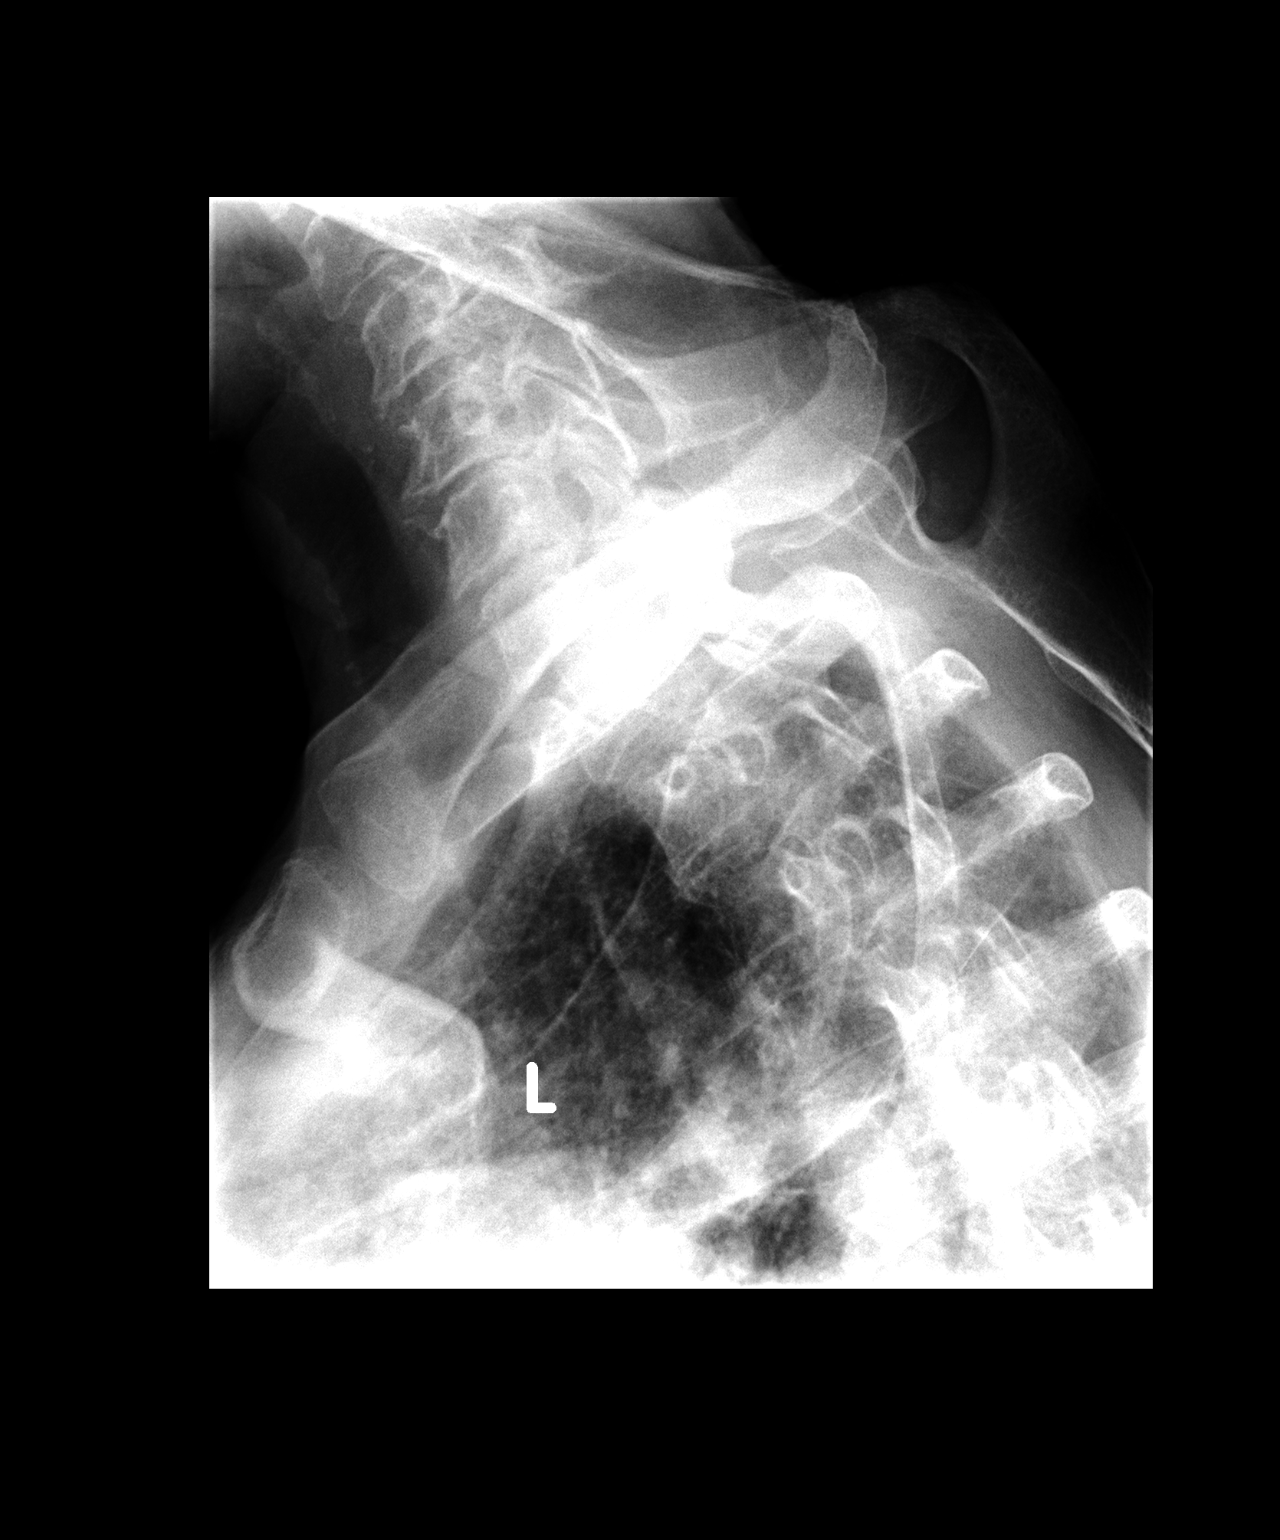

[3 of 3 positions shown; findings below may reference images not displayed]

FINDINGS: The bones are osteopenic.  No acute compression fracture
is seen.  Intervertebral disc spaces are within normal limits with
only minimal degenerative change present.  No paravertebral soft
tissue swelling is seen.
IMPRESSION: Normal alignment.  Osteopenia.  No acute abnormality.

## 2013-03-31 ENCOUNTER — Encounter: Payer: Self-pay | Admitting: Internal Medicine

## 2013-03-31 ENCOUNTER — Ambulatory Visit (INDEPENDENT_AMBULATORY_CARE_PROVIDER_SITE_OTHER): Payer: Medicare Other | Admitting: Internal Medicine

## 2013-03-31 VITALS — BP 140/78 | HR 86 | Temp 97.7°F | Wt 135.4 lb

## 2013-03-31 DIAGNOSIS — M1710 Unilateral primary osteoarthritis, unspecified knee: Secondary | ICD-10-CM

## 2013-03-31 DIAGNOSIS — IMO0002 Reserved for concepts with insufficient information to code with codable children: Secondary | ICD-10-CM

## 2013-03-31 DIAGNOSIS — J301 Allergic rhinitis due to pollen: Secondary | ICD-10-CM

## 2013-03-31 DIAGNOSIS — M810 Age-related osteoporosis without current pathological fracture: Secondary | ICD-10-CM

## 2013-03-31 DIAGNOSIS — E785 Hyperlipidemia, unspecified: Secondary | ICD-10-CM

## 2013-03-31 DIAGNOSIS — M545 Low back pain, unspecified: Secondary | ICD-10-CM

## 2013-03-31 DIAGNOSIS — M1711 Unilateral primary osteoarthritis, right knee: Secondary | ICD-10-CM

## 2013-03-31 DIAGNOSIS — N814 Uterovaginal prolapse, unspecified: Secondary | ICD-10-CM

## 2013-03-31 DIAGNOSIS — K2100 Gastro-esophageal reflux disease with esophagitis, without bleeding: Secondary | ICD-10-CM

## 2013-03-31 MED ORDER — ESTRADIOL 10 MCG VA TABS: 1.0000 | ORAL_TABLET | VAGINAL | Status: DC

## 2013-03-31 NOTE — Patient Instructions (Signed)
Try to walk more to lower your cholesterol

## 2013-03-31 NOTE — Progress Notes (Signed)
Patient ID: Rachel Morris, female   DOB: 1930-09-01, 79 y.o.   MRN: [id number]   Location:  [facility] / [place] [facility]  Code Status: DNR status reviewed, has living will and her daughter, Rachel Morris, is her HCPOA   Allergies  Allergen Reactions  . Augmentin [Amoxicillin-Pot Clavulanate]   . Brandy [Alcohol]     prunes  . Omnicef [Cefdinir]   . Alprazolam Rash  . Ceftin [Cefuroxime Axetil] Rash  . Doxycycline Rash  . Eryc [Erythromycin] Rash  . Penicillins Rash    Chief Complaint  Patient presents with  . Medical Managment of Chronic Issues    6 month follow-up   . Form Completion    Patient would like paperwork completed for [facility]   . Back Pain    Lower back pain, onsets in the Winter   . Knee Pain    Right knee pain, associated with the lower back pain    HPI: Patient is a 79 y.o. white female seen in the office today for medical mgt chronic diseases.  She plans to move into [facility] independent living and has a medical history form to be completed for this purpose.  She needs a ppd placed, as well.  A lot of stress with transition--has to pack all of the things from years of marriage.  Is a widow.  Lost her dog so no longer walks as much as she used to.    Went to [facility] rehab for PT a summer ago.  Got pain free.  Back pain gets much worse in the winter.  Used heat and electrical stimulation on her lower back with benefit.  Wonders if she could go back.  Has to watch her level of activity, but can mow lawn and do her home chores on her own.    Otherwise is feeling well.  Still gets hives and stuff.  Can't drink much coffee.  She loves it in the morning, but it makes her itchier.    Discussed code status, living will, hcpoa--[facility] completed for her. For ppd placement Friday to be read monday  Review of Systems:  Review of Systems  Constitutional: Negative for fever, chills and malaise/fatigue.  Eyes: Negative for blurred vision.    Respiratory: Negative for shortness of breath.   Cardiovascular: Negative for chest pain and leg swelling.  Gastrointestinal: Negative for heartburn, nausea, vomiting, constipation, blood in stool and melena.  Genitourinary:       Is going to gyn now about pessary--sees again in the spring--wants her to take medicine for osteoporosis;  Is afraid to take something that will last 6 mos when she frequently has reactions to medicines and foods  Musculoskeletal: Positive for back pain and joint pain. Negative for falls and myalgias.  Skin: Negative for rash.  Neurological: Negative for dizziness, loss of consciousness and headaches.  Endo/Heme/Allergies: Does not bruise/bleed easily.  Psychiatric/Behavioral: Negative for memory loss.       Stress    Past Medical History  Diagnosis Date  . Lyme borreliosis   . Arthritis     RIGHT LEG  . Cancer     FACE/NOSE -SKIN  . Hyperlipidemia LDL goal < 100   . Reflux esophagitis   . Anxiety state, unspecified   . Allergic rhinitis due to pollen   . Cystocele, midline   . Other atopic dermatitis and related conditions   . Lumbago   . Disorder of bone and cartilage, unspecified   . Other dysphagia   .  Senile osteoporosis     Past Surgical History  Procedure Laterality Date  . Hernia repair Bilateral   . Appendectomy  1950    Social History:   reports that she has never smoked. She does not have any smokeless tobacco history on file. She reports that she drinks alcohol. She reports that she does not use illicit drugs.  History reviewed. No pertinent family history.  Medications: Patient's Medications  New Prescriptions   No medications on file  Previous Medications   CLOBETASOL OINTMENT (TEMOVATE) 0.05 %       HYDROXYZINE (ATARAX/VISTARIL) 10 MG TABLET    Take 10 mg by mouth 3 (three) times daily as needed for itching.   PROBIOTIC PRODUCT (SOLUBLE FIBER/PROBIOTICS PO)    Take by mouth as needed.   Modified Medications   Modified  Medication Previous Medication   ESTRADIOL 10 MCG TABS VAGINAL TABLET Estradiol 10 MCG TABS      Place 1 tablet (10 mcg total) vaginally 2 (two) times a week.    Place 1 tablet (10 mcg total) vaginally 2 (two) times a week.  Discontinued Medications   CETIRIZINE (ZYRTEC) 10 MG TABLET    Take 10 mg by mouth daily.   MOMETASONE (NASONEX) 50 MCG/ACT NASAL SPRAY    Place 2 sprays into the nose daily.   SULFAMETHOXAZOLE-TRIMETHOPRIM (BACTRIM DS) 800-160 MG PER TABLET    Take 1 tablet by mouth 2 (two) times daily.     Physical Exam: Filed Vitals:   03/31/13 1057  BP: 140/78  Pulse: 86  Temp: 97.7 F (36.5 C)  TempSrc: Oral  Weight: 135 lb 6.4 oz (61.417 kg)  SpO2: 99%  Physical Exam  Constitutional: She is oriented to person, place, and time. She appears well-developed and well-nourished. No distress.  HENT:  Head: Normocephalic and atraumatic.  Cardiovascular: Normal rate, regular rhythm, normal heart sounds and intact distal pulses.   Pulmonary/Chest: Effort normal and breath sounds normal. No respiratory distress.  Abdominal: Soft. Bowel sounds are normal. She exhibits no distension. There is no tenderness.  Musculoskeletal: She exhibits no edema and no tenderness.  Limps due to right hip pain  Neurological: She is alert and oriented to person, place, and time. She has normal reflexes.  Skin: Skin is warm and dry.    Labs reviewed: Basic Metabolic Panel:  Recent Labs  29/56/21 1149 09/24/12 0853 11/09/12 1713  NA  --  140 135  K  --  4.4 4.2  CL  --  105 100  CO2  --  22 25  GLUCOSE  --  94 89  BUN 18 15 13   CREATININE 0.65 0.69 0.68  CALCIUM 10.2 10.0 9.8  CBC:  Recent Labs  09/24/12 0853 11/09/12 1729  WBC 6.8 9.2  NEUTROABS 3.5  --   HGB 13.5 12.8  HCT 41.6 41.1  MCV 95 96.5   Lipid Panel:  Recent Labs  09/24/12 0853  HDL 71  LDLCALC 150*  TRIG 89  CHOLHDL 3.4  2012:  Barium swallow:  nl 10/13/11:  Bone density:  Osteoporosis of hip with  T-2.5 10/20/11:  Thoracic spine with swimmers:  Normal alignment. Osteopenia. No acute abnormality  Lumbar spine:  Normal alignment with only mild degenerative disc disease at L4-5.  Complete right hip:  Moderate degenerative joint disease of the right hip. 07/08/12:  Stress test (scanned) 09/16/12:  Mammogram  No mammographic evidence of malignancy. A result letter of this screening mammogram will be mailed directly to the patient.  Assessment/Plan 1. Low back pain -imaging studies reviewed again -pain more bothersome again recently, has been packing and more active -will send back for more PT -has known mild DDD L4/5 - Ambulatory referral to Physical Therapy  2. Osteoarthritis of right knee -acting up in cold weather and with moving, uses tylenol sometimes - Ambulatory referral to Physical Therapy per her request  3. Allergic rhinitis due to pollen -stable allergies, has many environmental and food allergies as well as atopic dermatitis -no changes needed to medications  4. Senile osteoporosis -of hip, osteopenia in spine -will once again encourage vitamin D and calcium supplements (appears she stopped these), does eat balanced diet, also takes small amt of estrogen still at this point (should be tapered off altogether--due to allergies has refused alternative meds for bone density like bisphosphonates esp long acting and has esophagitis that prohibits weekly or monthly med)  5. Cystocele with small rectocele and uterine descent -following with gyn now -has pessary she gets changed  6. Reflux esophagitis -avoid triggering foods and elevate head of bed - Comprehensive metabolic panel; Future -not on any meds for this  7. Hyperlipidemia LDL goal < 100 -encouraged more walking for exercise, avoiding sweets and starchy fats -does have protective HDL though LDL elevated;  No cad history - Lipid panel; Future  Labs/tests ordered:  FLP, CMP, CBC before next visit Next appt:  6 mos  EV

## 2013-04-04 ENCOUNTER — Ambulatory Visit (INDEPENDENT_AMBULATORY_CARE_PROVIDER_SITE_OTHER): Payer: Medicare Other

## 2013-04-04 DIAGNOSIS — Z23 Encounter for immunization: Secondary | ICD-10-CM | POA: Diagnosis not present

## 2013-04-07 ENCOUNTER — Ambulatory Visit: Payer: Medicare Other | Attending: Internal Medicine | Admitting: Physical Therapy

## 2013-04-07 DIAGNOSIS — M545 Low back pain, unspecified: Secondary | ICD-10-CM | POA: Insufficient documentation

## 2013-04-07 DIAGNOSIS — M25559 Pain in unspecified hip: Secondary | ICD-10-CM | POA: Diagnosis not present

## 2013-04-07 DIAGNOSIS — IMO0001 Reserved for inherently not codable concepts without codable children: Secondary | ICD-10-CM | POA: Diagnosis not present

## 2013-04-07 LAB — TB SKIN TEST
Induration: 0 mm
TB Skin Test: NEGATIVE

## 2013-04-17 ENCOUNTER — Ambulatory Visit: Payer: Medicare Other | Admitting: Rehabilitation

## 2013-04-17 DIAGNOSIS — IMO0001 Reserved for inherently not codable concepts without codable children: Secondary | ICD-10-CM | POA: Diagnosis not present

## 2013-04-17 DIAGNOSIS — M545 Low back pain: Secondary | ICD-10-CM | POA: Diagnosis not present

## 2013-04-17 DIAGNOSIS — M25559 Pain in unspecified hip: Secondary | ICD-10-CM | POA: Diagnosis not present

## 2013-04-21 ENCOUNTER — Ambulatory Visit: Payer: Medicare Other | Admitting: Rehabilitation

## 2013-04-21 DIAGNOSIS — M545 Low back pain: Secondary | ICD-10-CM | POA: Diagnosis not present

## 2013-04-21 DIAGNOSIS — M25559 Pain in unspecified hip: Secondary | ICD-10-CM | POA: Diagnosis not present

## 2013-04-21 DIAGNOSIS — IMO0001 Reserved for inherently not codable concepts without codable children: Secondary | ICD-10-CM | POA: Diagnosis not present

## 2013-04-28 ENCOUNTER — Ambulatory Visit: Payer: Medicare Other | Admitting: Rehabilitation

## 2013-04-28 DIAGNOSIS — M25559 Pain in unspecified hip: Secondary | ICD-10-CM | POA: Diagnosis not present

## 2013-04-28 DIAGNOSIS — IMO0001 Reserved for inherently not codable concepts without codable children: Secondary | ICD-10-CM | POA: Diagnosis not present

## 2013-04-28 DIAGNOSIS — M545 Low back pain: Secondary | ICD-10-CM | POA: Diagnosis not present

## 2013-05-05 ENCOUNTER — Telehealth: Payer: Self-pay | Admitting: *Deleted

## 2013-05-05 NOTE — Telephone Encounter (Signed)
Spoke with patient and she states that she is not interested in having any surgery and has begun taking a invasive piliates class. Dr. Mariea Clonts also wanted her to know that the insurance company may not pay for this MRI.

## 2013-05-30 ENCOUNTER — Other Ambulatory Visit: Payer: Self-pay | Admitting: Family Medicine

## 2013-06-03 DIAGNOSIS — H40059 Ocular hypertension, unspecified eye: Secondary | ICD-10-CM | POA: Diagnosis not present

## 2013-06-03 DIAGNOSIS — H40019 Open angle with borderline findings, low risk, unspecified eye: Secondary | ICD-10-CM | POA: Diagnosis not present

## 2013-06-03 DIAGNOSIS — H353190 Nonexudative age-related macular degeneration, unspecified eye, stage unspecified: Secondary | ICD-10-CM | POA: Diagnosis not present

## 2013-06-03 DIAGNOSIS — Z961 Presence of intraocular lens: Secondary | ICD-10-CM | POA: Diagnosis not present

## 2013-06-03 DIAGNOSIS — H43819 Vitreous degeneration, unspecified eye: Secondary | ICD-10-CM | POA: Diagnosis not present

## 2013-06-23 DIAGNOSIS — J3089 Other allergic rhinitis: Secondary | ICD-10-CM | POA: Diagnosis not present

## 2013-06-23 DIAGNOSIS — J3081 Allergic rhinitis due to animal (cat) (dog) hair and dander: Secondary | ICD-10-CM | POA: Diagnosis not present

## 2013-06-23 DIAGNOSIS — L2089 Other atopic dermatitis: Secondary | ICD-10-CM | POA: Diagnosis not present

## 2013-06-23 DIAGNOSIS — J301 Allergic rhinitis due to pollen: Secondary | ICD-10-CM | POA: Diagnosis not present

## 2013-09-05 ENCOUNTER — Ambulatory Visit: Payer: Medicare Other

## 2013-09-05 ENCOUNTER — Ambulatory Visit (INDEPENDENT_AMBULATORY_CARE_PROVIDER_SITE_OTHER): Payer: Medicare Other | Admitting: Family Medicine

## 2013-09-05 VITALS — BP 132/86 | HR 97 | Temp 98.9°F | Resp 16 | Ht 64.0 in | Wt 133.0 lb

## 2013-09-05 DIAGNOSIS — R0789 Other chest pain: Secondary | ICD-10-CM

## 2013-09-05 DIAGNOSIS — R071 Chest pain on breathing: Secondary | ICD-10-CM

## 2013-09-05 DIAGNOSIS — S20219A Contusion of unspecified front wall of thorax, initial encounter: Secondary | ICD-10-CM | POA: Diagnosis not present

## 2013-09-05 MED ORDER — HYDROCODONE-ACETAMINOPHEN 5-325 MG PO TABS: 0.5000 | ORAL_TABLET | Freq: Four times a day (QID) | ORAL | Status: DC | PRN

## 2013-09-05 NOTE — Patient Instructions (Signed)
You may have a possible small rib fracture, but bruising of ribs can also be sore like this. Tylenol if needed, but if needed for more severe pain - can take 1/2 to 1 hydrocodone as discussed.  Be careful with sedation or dizziness with this medicine.   Rib Contusion A rib contusion (bruise) can occur by a blow to the chest or by a fall against a hard object. Usually these will be much better in a couple weeks. If X-rays were taken today and there are no broken bones (fractures), the diagnosis of bruising is made. However, broken ribs may not show up for several days, or may be discovered later on a routine X-ray when signs of healing show up. If this happens to you, it does not mean that something was missed on the X-ray, but simply that it did not show up on the first X-rays. Earlier diagnosis will not usually change the treatment. HOME CARE INSTRUCTIONS   Avoid strenuous activity. Be careful during activities and avoid bumping the injured ribs. Activities that pull on the injured ribs and cause pain should be avoided, if possible.  For the first day or two, an ice pack used every 20 minutes while awake may be helpful. Put ice in a plastic bag and put a towel between the bag and the skin.  Eat a normal, well-balanced diet. Drink plenty of fluids to avoid constipation.  Take deep breaths several times a day to keep lungs free of infection. Try to cough several times a day. Splint the injured area with a pillow while coughing to ease pain. Coughing can help prevent pneumonia.  Wear a rib belt or binder only if told to do so by your caregiver. If you are wearing a rib belt or binder, you must do the breathing exercises as directed by your caregiver. If not used properly, rib belts or binders restrict breathing which can lead to pneumonia.  Only take over-the-counter or prescription medicines for pain, discomfort, or fever as directed by your caregiver. SEEK MEDICAL CARE IF:   You or your child has  an oral temperature above 102 F (38.9 C).  Your baby is older than 3 months with a rectal temperature of 100.5 F (38.1 C) or higher for more than 1 day.  You develop a cough, with thick or bloody sputum. SEEK IMMEDIATE MEDICAL CARE IF:   You have difficulty breathing.  You feel sick to your stomach (nausea), have vomiting or belly (abdominal) pain.  You have worsening pain, not controlled with medications, or there is a change in the location of the pain.  You develop sweating or radiation of the pain into the arms, jaw or shoulders, or become light headed or faint.  You or your child has an oral temperature above 102 F (38.9 C), not controlled by medicine.  Your or your baby is older than 3 months with a rectal temperature of 102 F (38.9 C) or higher.  Your baby is 2 months old or younger with a rectal temperature of 100.4 F (38 C) or higher. MAKE SURE YOU:   Understand these instructions.  Will watch your condition.  Will get help right away if you are not doing well or get worse. Document Released: 01/10/2001 Document Revised: 08/12/2012 Document Reviewed: 12/04/2007 [facility] Patient Information 2014 [place].

## 2013-09-05 NOTE — Progress Notes (Addendum)
Subjective:  This chart was scribed for Wendie Agreste, MD by Ludger Nutting, ED Scribe. This patient was seen in room 1 and the patient's care was started 1:41 PM.    Patient ID: Rachel Morris, female    DOB: Sep 11, 1930, 79 y.o.   MRN: [id number]  HPI HPI Comments: Rachel Morris is a 80 y.o. female who presents to [facility] complaining of a left rib injury that occurred 2 days ago. She reports moving to [facility] about 3 weeks ago. She reports the injury occurred as she was getting out of bed, striking her left rib area on a pointed bed post. She states the pain is constant and has noticed an associated bruise develop. She states the pain is worse with deep breathing. She has not tried any at home remedies for this pain. She denies SOB, cough, hemoptysis, hematuria. She denies similar symptoms in the past. She has a history of osteopenia, last tested 2 years ago.   Patient Active Problem List   Diagnosis Date Noted  . Senile osteoporosis 09/30/2012  . Reflux esophagitis 09/30/2012  . Hyperlipidemia LDL goal < 100   . Arthritis   . Allergic rhinitis due to pollen   . Cystocele with small rectocele and uterine descent 09/09/2012  . Vaginal atrophy 09/09/2012   Past Medical History  Diagnosis Date  . Lyme borreliosis   . Arthritis     RIGHT LEG  . Cancer     FACE/NOSE -SKIN  . Hyperlipidemia LDL goal < 100   . Reflux esophagitis   . Anxiety state, unspecified   . Allergic rhinitis due to pollen   . Cystocele, midline   . Other atopic dermatitis and related conditions   . Lumbago   . Disorder of bone and cartilage, unspecified   . Other dysphagia   . Senile osteoporosis    Past Surgical History  Procedure Laterality Date  . Hernia repair Bilateral   . Appendectomy  1950   Allergies  Allergen Reactions  . Augmentin [Amoxicillin-Pot Clavulanate]   . Brandy [Alcohol]   . Omnicef [Cefdinir]   . Alprazolam Rash  . Ceftin [Cefuroxime Axetil] Rash  . Doxycycline Rash    . Eryc [Erythromycin] Rash  . Penicillins Rash   Prior to Admission medications   Medication Sig Start Date End Date Taking? Authorizing Provider  Estradiol 10 MCG TABS vaginal tablet Place 1 tablet (10 mcg total) vaginally 2 (two) times a week. 03/31/13  Yes Tiffany L Reed, DO  clobetasol ointment (TEMOVATE) 0.05 %  06/24/12   Historical Provider, MD  hydrOXYzine (ATARAX/VISTARIL) 10 MG tablet Take 10 mg by mouth 3 (three) times daily as needed for itching.    Historical Provider, MD  Probiotic Product (SOLUBLE FIBER/PROBIOTICS PO) Take by mouth as needed.     Historical Provider, MD   History   Social History  . Marital Status: Widowed    Spouse Name: N/A    Number of Children: N/A  . Years of Education: N/A   Occupational History  . Not on file.   Social History Main Topics  . Smoking status: Never Smoker   . Smokeless tobacco: Not on file  . Alcohol Use: Yes     Comment: WITH DINNER   . Drug Use: No  . Sexual Activity: Not on file   Other Topics Concern  . Not on file   Social History Narrative  . No narrative on file     Review of Systems  Respiratory:  Negative for cough and shortness of breath.   Genitourinary: Negative for hematuria.  Musculoskeletal: Positive for arthralgias (left rib pain). Negative for back pain and neck pain.  Skin:       +bruise  Neurological: Negative for weakness and numbness.       Objective:   Physical Exam  Nursing note and vitals reviewed. Constitutional: She is oriented to person, place, and time. She appears well-developed and well-nourished.  HENT:  Head: Normocephalic and atraumatic.  Eyes: EOM are normal.  Neck: Normal range of motion.  Cardiovascular: Normal rate, regular rhythm and normal heart sounds.   Pulmonary/Chest: Effort normal and breath sounds normal. No respiratory distress. She has no wheezes. She has no rales. She exhibits tenderness.  Tender over left lateral chest wall at the mid axillary line. Small  approximately 2 cm faint bruise over the posterior axillary line.   Abdominal: Soft. She exhibits no distension. There is no tenderness.  Musculoskeletal: Normal range of motion.  Neurological: She is alert and oriented to person, place, and time.  Skin: Skin is warm and dry.  Psychiatric: She has a normal mood and affect.    Filed Vitals:   09/05/13 1226  BP: 132/86  Pulse: 97  Temp: 98.9 F (37.2 C)  TempSrc: Oral  Resp: 16  Height: 5\' 4"  (1.626 m)  Weight: 133 lb (60.328 kg)  SpO2: 98%    [facility] reading (PRIMARY) by  Dr. Carlota Raspberry: L rib series: no ptx, possible ND rib fracture approx 9th -10th rib.      Assessment & Plan:   Rachel Morris is a 80 y.o. female Contusion, chest wall - Plan: DG Ribs Unilateral W/Chest Left, HYDROcodone-acetaminophen (NORCO/VICODIN) 5-325 MG per tablet  Chest wall pain - Plan: DG Ribs Unilateral W/Chest Left, HYDROcodone-acetaminophen (NORCO/VICODIN) 5-325 MG per tablet  Possible nondisplaced L sided rib fx vs contusion.  Sx care discussed, incentive spirometry discussed, tylenol if needed or lortab for more severe pain. SED and fall precautions. rtc precautions.    Meds ordered this encounter  Medications  . HYDROcodone-acetaminophen (NORCO/VICODIN) 5-325 MG per tablet    Sig: Take 0.5-1 tablets by mouth every 6 (six) hours as needed for moderate pain.    Dispense:  20 tablet    Refill:  0   Patient Instructions  You may have a possible small rib fracture, but bruising of ribs can also be sore like this. Tylenol if needed, but if needed for more severe pain - can take 1/2 to 1 hydrocodone as discussed.  Be careful with sedation or dizziness with this medicine.   Rib Contusion A rib contusion (bruise) can occur by a blow to the chest or by a fall against a hard object. Usually these will be much better in a couple weeks. If X-rays were taken today and there are no broken bones (fractures), the diagnosis of bruising is made. However, broken  ribs may not show up for several days, or may be discovered later on a routine X-ray when signs of healing show up. If this happens to you, it does not mean that something was missed on the X-ray, but simply that it did not show up on the first X-rays. Earlier diagnosis will not usually change the treatment. HOME CARE INSTRUCTIONS   Avoid strenuous activity. Be careful during activities and avoid bumping the injured ribs. Activities that pull on the injured ribs and cause pain should be avoided, if possible.  For the first day or two, an ice pack  used every 20 minutes while awake may be helpful. Put ice in a plastic bag and put a towel between the bag and the skin.  Eat a normal, well-balanced diet. Drink plenty of fluids to avoid constipation.  Take deep breaths several times a day to keep lungs free of infection. Try to cough several times a day. Splint the injured area with a pillow while coughing to ease pain. Coughing can help prevent pneumonia.  Wear a rib belt or binder only if told to do so by your caregiver. If you are wearing a rib belt or binder, you must do the breathing exercises as directed by your caregiver. If not used properly, rib belts or binders restrict breathing which can lead to pneumonia.  Only take over-the-counter or prescription medicines for pain, discomfort, or fever as directed by your caregiver. SEEK MEDICAL CARE IF:   You or your child has an oral temperature above 102 F (38.9 C).  Your baby is older than 3 months with a rectal temperature of 100.5 F (38.1 C) or higher for more than 1 day.  You develop a cough, with thick or bloody sputum. SEEK IMMEDIATE MEDICAL CARE IF:   You have difficulty breathing.  You feel sick to your stomach (nausea), have vomiting or belly (abdominal) pain.  You have worsening pain, not controlled with medications, or there is a change in the location of the pain.  You develop sweating or radiation of the pain into the arms,  jaw or shoulders, or become light headed or faint.  You or your child has an oral temperature above 102 F (38.9 C), not controlled by medicine.  Your or your baby is older than 3 months with a rectal temperature of 102 F (38.9 C) or higher.  Your baby is 41 months old or younger with a rectal temperature of 100.4 F (38 C) or higher. MAKE SURE YOU:   Understand these instructions.  Will watch your condition.  Will get help right away if you are not doing well or get worse. Document Released: 01/10/2001 Document Revised: 08/12/2012 Document Reviewed: 12/04/2007 [facility] Patient Information 2014 [place].       I personally performed the services described in this documentation, which was scribed in my presence. The recorded information has been reviewed and considered, and addended by me as needed.

## 2013-09-10 ENCOUNTER — Ambulatory Visit (INDEPENDENT_AMBULATORY_CARE_PROVIDER_SITE_OTHER): Payer: Medicare Other | Admitting: Gynecology

## 2013-09-10 ENCOUNTER — Encounter: Payer: Self-pay | Admitting: Gynecology

## 2013-09-10 VITALS — BP 136/84 | Ht 63.0 in | Wt 132.0 lb

## 2013-09-10 DIAGNOSIS — IMO0002 Reserved for concepts with insufficient information to code with codable children: Secondary | ICD-10-CM

## 2013-09-10 DIAGNOSIS — N816 Rectocele: Secondary | ICD-10-CM

## 2013-09-10 DIAGNOSIS — N814 Uterovaginal prolapse, unspecified: Secondary | ICD-10-CM | POA: Diagnosis not present

## 2013-09-10 DIAGNOSIS — Z4689 Encounter for fitting and adjustment of other specified devices: Secondary | ICD-10-CM

## 2013-09-10 DIAGNOSIS — N952 Postmenopausal atrophic vaginitis: Secondary | ICD-10-CM | POA: Diagnosis not present

## 2013-09-10 DIAGNOSIS — M810 Age-related osteoporosis without current pathological fracture: Secondary | ICD-10-CM

## 2013-09-10 DIAGNOSIS — N8111 Cystocele, midline: Secondary | ICD-10-CM | POA: Diagnosis not present

## 2013-09-10 MED ORDER — ESTRADIOL 10 MCG VA TABS: 1.0000 | ORAL_TABLET | VAGINAL | Status: DC

## 2013-09-10 NOTE — Progress Notes (Signed)
Rachel Morris 1930/10/11 [id number]   History:    80 y.o.  for GYN exam and followup on her pelvic organ prolapse as well as maintenance of her pessary.Patient has refused any type of surgery. She stated that her specimen has helped her and has minimal if any incontinence. She inserts a Vagifem 10 mcg tablet twice a week for her vaginal atrophy as well. She takes about every 3 months and cleans herself. She did this one month ago. Her last bone density study was in 2013 her lowest T score was in left femoral neck -2.5 she is on no treatment at the present time. Patient wanted to wait until this years bone density study to compare at this same facility before deciding to initiate treatment for osteoporosis that had previously been recommended. Patient with no prior history of abnormal Pap smears. Her colonoscopy was done by Dr. Collene Mares in 2009.   Past medical history,surgical history, family history and social history were all reviewed and documented in the EPIC chart.  Gynecologic History No LMP recorded. Patient is postmenopausal. Contraception: post menopausal status Last Pap: 2009. Results were: normal Last mammogram: 2014. Results were: normal  Obstetric History OB History  Gravida Para Term Preterm AB SAB TAB Ectopic Multiple Living  2 2        2     # Outcome Date GA Lbr Len/2nd Weight Sex Delivery Anes PTL Lv  2 PAR           1 PAR                ROS: A ROS was performed and pertinent positives and negatives are included in the history.  GENERAL: No fevers or chills. HEENT: No change in vision, no earache, sore throat or sinus congestion. NECK: No pain or stiffness. CARDIOVASCULAR: No chest pain or pressure. No palpitations. PULMONARY: No shortness of breath, cough or wheeze. GASTROINTESTINAL: No abdominal pain, nausea, vomiting or diarrhea, melena or bright red blood per rectum. GENITOURINARY: No urinary frequency, urgency, hesitancy or dysuria. MUSCULOSKELETAL: No joint or  muscle pain, no back pain, no recent trauma. DERMATOLOGIC: No rash, no itching, no lesions. ENDOCRINE: No polyuria, polydipsia, no heat or cold intolerance. No recent change in weight. HEMATOLOGICAL: No anemia or easy bruising or bleeding. NEUROLOGIC: No headache, seizures, numbness, tingling or weakness. PSYCHIATRIC: No depression, no loss of interest in normal activity or change in sleep pattern.     Exam: chaperone present  BP 136/84  Ht 5\' 3"  (1.6 m)  Wt 132 lb (59.875 kg)  BMI 23.39 kg/m2  Body mass index is 23.39 kg/(m^2).  General appearance : Well developed well nourished female. No acute distress HEENT: Neck supple, trachea midline, no carotid bruits, no thyroidmegaly Lungs: Clear to auscultation, no rhonchi or wheezes, or rib retractions  Heart: Regular rate and rhythm, no murmurs or gallops Breast:Examined in sitting and supine position were symmetrical in appearance, no palpable masses or tenderness,  no skin retraction, no nipple inversion, no nipple discharge, no skin discoloration, no axillary or supraclavicular lymphadenopathy Abdomen: no palpable masses or tenderness, no rebound or guarding Extremities: no edema or skin discoloration or tenderness  Pelvic:  Bartholin, Urethra, Skene Glands: Within normal limits             Vagina: No gross lesions or discharge, atrophic changes, second-degree cystocele, first-degree rectocele  Cervix: No gross lesions or discharge  Uterus  axial, normal size, shape and consistency, non-tender and mobile, protrudes to half the  distance of the vagina  Adnexa  Without masses or tenderness  Anus and perineum  normal   Rectovaginal  normal sphincter tone without palpated masses or tenderness             Hemoccult PCP provides.     Assessment/Plan:  80 y.o. female who has a second-degree cystocele, first-degree rectocele, first-degree uterine descensus using a ring pessary which has worked well for her which she  Cleans every 3 months. We  previously had discussed the findings on her bone density study showing that her left femoral neck was -2.5. Her bone mineral density is 25% with low normal and she has an 8 times greater risk a spinal fracture and 11 times greater risk of a hip fracture in comparison to the general population. We discussed different treatment options she would best be served with Prolia 60 mg subcutaneous every 6 months. . We will schedule her bone density and sit down at a later date for consultation to review and plan course of management.. Will need to check her BUN, creatinine and calcium and vitamin D level before the injection. Pap smear done today in accordance to the new guidelines. Patient's vaccines up-to-date. We discussed importance of calcium and vitamin D and regular exercise for osteoporosis prevention. We also discussed importance of monthly self breast exam.  Note: This dictation was prepared with  Dragon/digital dictation along withSmart phrase technology. Any transcriptional errors that result from this process are unintentional.   Terrance Mass MD, 4:40 PM 09/10/2013

## 2013-09-10 NOTE — Patient Instructions (Signed)
Bone Densitometry Bone densitometry is a special X-ray that measures your bone density and can be used to help predict your risk of bone fractures. This test is used to determine bone mineral content and density to diagnose osteoporosis. Osteoporosis is the loss of bone that may cause the bone to become weak. Osteoporosis commonly occurs in women entering menopause. However, it may be found in men and in people with other diseases. PREPARATION FOR TEST No preparation necessary. WHO SHOULD BE TESTED?  All women older than 65.  Postmenopausal women (50 to 65) with risk factors for osteoporosis.  People with a previous fracture caused by normal activities.  People with a small body frame (less than 127 poundsor a body mass index [BMI] of less than 21).  People who have a parent with a hip fracture or history of osteoporosis.  People who smoke.  People who have rheumatoid arthritis.  Anyone who engages in excessive alcohol use (more than 3 drinks most days).  Women who experience early menopause. WHEN SHOULD YOU BE RETESTED? Current guidelines suggest that you should wait at least 2 years before doing a bone density test again if your first test was normal.Recent studies indicated that women with normal bone density may be able to wait a few years before needing to repeat a bone density test. You should discuss this with your caregiver.  NORMAL FINDINGS   Normal: less than standard deviation below normal (greater than -1).  Osteopenia: 1 to 2.5 standard deviations below normal (-1 to -2.5).  Osteoporosis: greater than 2.5 standard deviations below normal (less than -2.5). Test results are reported as a "T score" and a "Z score."The T score is a number that compares your bone density with the bone density of healthy, young women.The Z score is a number that compares your bone density with the scores of women who are the same age, gender, and race.  Ranges for normal findings may vary  among different laboratories and hospitals. You should always check with your doctor after having lab work or other tests done to discuss the meaning of your test results and whether your values are considered within normal limits. MEANING OF TEST  Your caregiver will go over the test results with you and discuss the importance and meaning of your results, as well as treatment options and the need for additional tests if necessary. OBTAINING THE TEST RESULTS It is your responsibility to obtain your test results. Ask the lab or department performing the test when and how you will get your results. Document Released: 05/09/2004 Document Revised: 07/10/2011 Document Reviewed: 06/01/2010 [employer] Patient Information 2014 [employer].  

## 2013-09-12 ENCOUNTER — Other Ambulatory Visit: Payer: Self-pay

## 2013-09-12 DIAGNOSIS — Z1231 Encounter for screening mammogram for malignant neoplasm of breast: Secondary | ICD-10-CM

## 2013-09-15 ENCOUNTER — Other Ambulatory Visit: Payer: Self-pay | Admitting: Gynecology

## 2013-09-24 ENCOUNTER — Other Ambulatory Visit: Payer: Medicare Other

## 2013-09-25 ENCOUNTER — Other Ambulatory Visit: Payer: Medicare Other

## 2013-09-25 DIAGNOSIS — J301 Allergic rhinitis due to pollen: Secondary | ICD-10-CM

## 2013-09-25 DIAGNOSIS — E785 Hyperlipidemia, unspecified: Secondary | ICD-10-CM | POA: Diagnosis not present

## 2013-09-25 DIAGNOSIS — K2100 Gastro-esophageal reflux disease with esophagitis, without bleeding: Secondary | ICD-10-CM

## 2013-09-25 DIAGNOSIS — N814 Uterovaginal prolapse, unspecified: Secondary | ICD-10-CM | POA: Diagnosis not present

## 2013-09-25 DIAGNOSIS — M545 Low back pain, unspecified: Secondary | ICD-10-CM

## 2013-09-26 LAB — CBC WITH DIFFERENTIAL/PLATELET
Basophils Absolute: 0.1 10*3/uL (ref 0.0–0.2)
Basos: 1 %
Eos: 5 %
Eosinophils Absolute: 0.4 10*3/uL (ref 0.0–0.4)
HCT: 43.2 % (ref 34.0–46.6)
Hemoglobin: 13.7 g/dL (ref 11.1–15.9)
Immature Grans (Abs): 0 10*3/uL (ref 0.0–0.1)
Immature Granulocytes: 0 %
Lymphocytes Absolute: 2.5 10*3/uL (ref 0.7–3.1)
Lymphs: 33 %
MCH: 30.9 pg (ref 26.6–33.0)
MCHC: 31.7 g/dL (ref 31.5–35.7)
MCV: 97 fL (ref 79–97)
Monocytes Absolute: 0.6 10*3/uL (ref 0.1–0.9)
Monocytes: 9 %
Neutrophils Absolute: 3.9 10*3/uL (ref 1.4–7.0)
Neutrophils Relative %: 52 %
RBC: 4.44 x10E6/uL (ref 3.77–5.28)
RDW: 13.2 % (ref 12.3–15.4)
WBC: 7.4 10*3/uL (ref 3.4–10.8)

## 2013-09-26 LAB — COMPREHENSIVE METABOLIC PANEL WITH GFR
ALT: 15 [IU]/L (ref 0–32)
AST: 17 [IU]/L (ref 0–40)
Albumin/Globulin Ratio: 1.9 (ref 1.1–2.5)
Albumin: 4.4 g/dL (ref 3.5–4.7)
Alkaline Phosphatase: 97 [IU]/L (ref 39–117)
BUN/Creatinine Ratio: 21 (ref 11–26)
BUN: 15 mg/dL (ref 8–27)
CO2: 25 mmol/L (ref 18–29)
Calcium: 10.3 mg/dL (ref 8.7–10.3)
Chloride: 99 mmol/L (ref 97–108)
Creatinine, Ser: 0.72 mg/dL (ref 0.57–1.00)
GFR calc Af Amer: 91 mL/min/{1.73_m2}
GFR calc non Af Amer: 79 mL/min/{1.73_m2}
Globulin, Total: 2.3 g/dL (ref 1.5–4.5)
Glucose: 76 mg/dL (ref 65–99)
Potassium: 4.3 mmol/L (ref 3.5–5.2)
Sodium: 138 mmol/L (ref 134–144)
Total Bilirubin: 0.3 mg/dL (ref 0.0–1.2)
Total Protein: 6.7 g/dL (ref 6.0–8.5)

## 2013-09-26 LAB — LIPID PANEL
Chol/HDL Ratio: 2.5 ratio units (ref 0.0–4.4)
Cholesterol, Total: 234 mg/dL — ABNORMAL HIGH (ref 100–199)
HDL: 92 mg/dL (ref >39)
LDL Calculated: 117 mg/dL — ABNORMAL HIGH (ref 0–99)
Triglycerides: 124 mg/dL (ref 0–149)
VLDL Cholesterol Cal: 25 mg/dL (ref 5–40)

## 2013-09-29 ENCOUNTER — Ambulatory Visit: Payer: Medicare Other | Admitting: Internal Medicine

## 2013-10-09 ENCOUNTER — Encounter: Payer: Self-pay | Admitting: Internal Medicine

## 2013-10-09 ENCOUNTER — Ambulatory Visit (INDEPENDENT_AMBULATORY_CARE_PROVIDER_SITE_OTHER): Payer: Medicare Other | Admitting: Internal Medicine

## 2013-10-09 VITALS — BP 150/78 | HR 89 | Temp 97.7°F | Resp 20 | Ht 63.0 in | Wt 132.2 lb

## 2013-10-09 DIAGNOSIS — M1711 Unilateral primary osteoarthritis, right knee: Secondary | ICD-10-CM

## 2013-10-09 DIAGNOSIS — M545 Low back pain, unspecified: Secondary | ICD-10-CM

## 2013-10-09 DIAGNOSIS — N814 Uterovaginal prolapse, unspecified: Secondary | ICD-10-CM | POA: Diagnosis not present

## 2013-10-09 DIAGNOSIS — H7390 Unspecified disorder of tympanic membrane, unspecified ear: Secondary | ICD-10-CM

## 2013-10-09 DIAGNOSIS — IMO0002 Reserved for concepts with insufficient information to code with codable children: Secondary | ICD-10-CM | POA: Diagnosis not present

## 2013-10-09 DIAGNOSIS — M1710 Unilateral primary osteoarthritis, unspecified knee: Secondary | ICD-10-CM | POA: Diagnosis not present

## 2013-10-09 DIAGNOSIS — M810 Age-related osteoporosis without current pathological fracture: Secondary | ICD-10-CM

## 2013-10-09 DIAGNOSIS — E785 Hyperlipidemia, unspecified: Secondary | ICD-10-CM

## 2013-10-09 DIAGNOSIS — H6591 Unspecified nonsuppurative otitis media, right ear: Secondary | ICD-10-CM
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
## 2013-10-14 DIAGNOSIS — L821 Other seborrheic keratosis: Secondary | ICD-10-CM | POA: Diagnosis not present

## 2013-10-14 DIAGNOSIS — Z85828 Personal history of other malignant neoplasm of skin: Secondary | ICD-10-CM | POA: Diagnosis not present

## 2013-10-14 DIAGNOSIS — L723 Sebaceous cyst: Secondary | ICD-10-CM | POA: Diagnosis not present

## 2013-10-14 DIAGNOSIS — L570 Actinic keratosis: Secondary | ICD-10-CM | POA: Diagnosis not present

## 2013-10-14 DIAGNOSIS — D239 Other benign neoplasm of skin, unspecified: Secondary | ICD-10-CM | POA: Diagnosis not present

## 2013-10-16 ENCOUNTER — Ambulatory Visit (INDEPENDENT_AMBULATORY_CARE_PROVIDER_SITE_OTHER): Payer: Medicare Other

## 2013-10-16 DIAGNOSIS — M810 Age-related osteoporosis without current pathological fracture: Secondary | ICD-10-CM

## 2013-10-20 ENCOUNTER — Ambulatory Visit
Admission: RE | Admit: 2013-10-20 | Discharge: 2013-10-20 | Disposition: A | Discharge status: Home / Self Care | Payer: Medicare Other | Source: Ambulatory Visit

## 2013-10-20 DIAGNOSIS — Z1231 Encounter for screening mammogram for malignant neoplasm of breast: Secondary | ICD-10-CM | POA: Diagnosis not present

## 2013-10-22 ENCOUNTER — Other Ambulatory Visit: Payer: Self-pay | Admitting: Gynecology

## 2013-10-30 ENCOUNTER — Encounter: Payer: Self-pay | Admitting: Nurse Practitioner

## 2013-10-30 ENCOUNTER — Ambulatory Visit (INDEPENDENT_AMBULATORY_CARE_PROVIDER_SITE_OTHER): Payer: Medicare Other | Admitting: Nurse Practitioner

## 2013-10-30 VITALS — BP 162/86 | HR 89 | Temp 98.2°F | Resp 18 | Ht 63.0 in | Wt 132.4 lb

## 2013-10-30 DIAGNOSIS — J00 Acute nasopharyngitis [common cold]: Secondary | ICD-10-CM

## 2013-10-30 NOTE — Progress Notes (Signed)
Patient ID: Rachel Morris, female   DOB: [birth year], 80 y.o.   MRN: [id number]    Allergies  Allergen Reactions  . Augmentin [Amoxicillin-Pot Clavulanate]   . Brandy [Alcohol]   . Omnicef [Cefdinir]   . Alprazolam Rash  . Ceftin [Cefuroxime Axetil] Rash  . Doxycycline Rash  . Eryc [Erythromycin] Rash  . Penicillins Rash    Chief Complaint  Patient presents with  . Acute Visit    HPI: Patient is a 80 y.o. female seen in the office today for cold, reports [facility] into [facility]- took a lot of time and effort to get the move complete, was exhausted started to feel scratchy/sore throat- started 2 weeks ago. Then she was schedule to go to [place] with her granddaughter to see her family so she went. Feels like increased fluid in her right ear with some tenderness think it is getting better. Feels increased pressure and nasal congestion. Has post nasal drip and cough, cough is better today and is productive, thick, mostly clear sometimes green. No fever or chills. Overall feels ok just tired from her trip  Review of Systems:  Review of Systems  Constitutional: Positive for malaise/fatigue. Negative for fever and chills.  HENT: Positive for congestion and hearing loss. Negative for ear discharge, ear pain, nosebleeds, sore throat (has sore throat but not currently) and tinnitus.   Respiratory: Positive for cough and sputum production. Negative for hemoptysis, shortness of breath, wheezing and stridor.   Cardiovascular: Negative for chest pain and palpitations.  Gastrointestinal: Negative for diarrhea and constipation.  Genitourinary: Negative for dysuria, urgency and frequency.  Skin: Negative.   Neurological: Negative for headaches.     Past Medical History  Diagnosis Date  . Lyme borreliosis   . Arthritis     RIGHT LEG  . Cancer     FACE/NOSE -SKIN  . Hyperlipidemia LDL goal < 100   . Reflux esophagitis   . Anxiety state, unspecified   . Allergic rhinitis due to  pollen   . Cystocele, midline   . Other atopic dermatitis and related conditions   . Lumbago   . Disorder of bone and cartilage, unspecified   . Other dysphagia   . Senile osteoporosis    Past Surgical History  Procedure Laterality Date  . Hernia repair Bilateral   . Appendectomy  1950   Social History:   reports that she has never smoked. She does not have any smokeless tobacco history on file. She reports that she drinks alcohol. She reports that she does not use illicit drugs.  No family history on file.  Medications: Patient's Medications  New Prescriptions   No medications on file  Previous Medications   CETIRIZINE HCL 10 MG CAPS    Take 10 mg by mouth daily.   CHOLECALCIFEROL (VITAMIN D) 1000 UNITS TABLET    Take 1,000 Units by mouth daily.   OXYMETAZOLINE (AFRIN 12 HOUR) 0.05 % NASAL SPRAY    Place 1 spray into both nostrils every morning.   VAGIFEM 10 MCG TABS VAGINAL TABLET    INSERT 1 TABLET VAGINALLY TWICE A WEEK.  Modified Medications   No medications on file  Discontinued Medications   No medications on file     Physical Exam:  Filed Vitals:   10/30/13 1421  BP: 162/86  Pulse: 89  Temp: 98.2 F (36.8 C)  TempSrc: Oral  Resp: 18  Height: 5\' 3"  (1.6 m)  Weight: 132 lb 6.4 oz (60.056 kg)  SpO2: 98%  Physical Exam  Constitutional: She is oriented to person, place, and time and well-developed, well-nourished, and in no distress. Vital signs are normal. No distress.  HENT:  Head: Normocephalic and atraumatic.  Right Ear: Tympanic membrane, external ear and ear canal normal.  Left Ear: Tympanic membrane, external ear and ear canal normal.  Nose: Nose normal.  Mouth/Throat: Oropharynx is clear and moist. No oropharyngeal exudate.  Eyes: Conjunctivae and EOM are normal. Pupils are equal, round, and reactive to light.  Neck: Normal range of motion. Neck supple. No tracheal deviation present. No thyromegaly present.  Cardiovascular: Normal rate, regular  rhythm and normal heart sounds.   Pulmonary/Chest: Effort normal and breath sounds normal. No respiratory distress.  Abdominal: Soft. Bowel sounds are normal. She exhibits no distension. There is no tenderness.  Musculoskeletal: Normal range of motion. She exhibits no edema and no tenderness.  Lymphadenopathy:    She has no cervical adenopathy.  Neurological: She is alert and oriented to person, place, and time.  Skin: Skin is warm and dry. She is not diaphoretic.  Psychiatric: Affect normal.     Labs reviewed: Basic Metabolic Panel:  Recent Labs  11/09/12 1713 09/25/13 0817  NA 135 138  K 4.2 4.3  CL 100 99  CO2 25 25  GLUCOSE 89 76  BUN 13 15  CREATININE 0.68 0.72  CALCIUM 9.8 10.3   Liver Function Tests:  Recent Labs  09/25/13 0817  AST 17  ALT 15  ALKPHOS 97  BILITOT 0.3  PROT 6.7   No results found for this basename: LIPASE, AMYLASE,  in the last 8760 hours No results found for this basename: AMMONIA,  in the last 8760 hours CBC:  Recent Labs  11/09/12 1729 09/25/13 0817  WBC 9.2 7.4  NEUTROABS  --  3.9  HGB 12.8 13.7  HCT 41.1 43.2  MCV 96.5 97   Lipid Panel:  Recent Labs  09/25/13 0817  HDL 92  LDLCALC 117*  TRIG 124  CHOLHDL 2.5   TSH: No results found for this basename: TSH,  in the last 8760 hours A1C: No results found for this basename: HGBA1C     Assessment/Plan 1. Common cold -supportive care, feels like she is improving -conts to work on hydration and proper nutrition -mucinex dm as needed for cough and congestion -hx of fluid in right ear, will notify us if sinuses become worse  -return precautions discussed

## 2013-10-30 NOTE — Patient Instructions (Signed)
Call if no improvement by Monday mucinex DM for cough and congestion Drink plenty of water

## 2013-11-05 DIAGNOSIS — M26629 Arthralgia of temporomandibular joint, unspecified side: Secondary | ICD-10-CM | POA: Diagnosis not present

## 2013-11-05 DIAGNOSIS — H6980 Other specified disorders of Eustachian tube, unspecified ear: Secondary | ICD-10-CM | POA: Diagnosis not present

## 2013-11-05 DIAGNOSIS — J019 Acute sinusitis, unspecified: Secondary | ICD-10-CM | POA: Diagnosis not present

## 2013-11-05 DIAGNOSIS — J309 Allergic rhinitis, unspecified: Secondary | ICD-10-CM | POA: Diagnosis not present

## 2013-11-05 DIAGNOSIS — H6120 Impacted cerumen, unspecified ear: Secondary | ICD-10-CM | POA: Diagnosis not present

## 2013-12-24 DIAGNOSIS — H1045 Other chronic allergic conjunctivitis: Secondary | ICD-10-CM | POA: Diagnosis not present

## 2013-12-24 DIAGNOSIS — H04129 Dry eye syndrome of unspecified lacrimal gland: Secondary | ICD-10-CM | POA: Diagnosis not present

## 2013-12-24 DIAGNOSIS — H11009 Unspecified pterygium of unspecified eye: Secondary | ICD-10-CM | POA: Diagnosis not present

## 2013-12-24 DIAGNOSIS — Z961 Presence of intraocular lens: Secondary | ICD-10-CM | POA: Diagnosis not present

## 2013-12-24 DIAGNOSIS — H43819 Vitreous degeneration, unspecified eye: Secondary | ICD-10-CM | POA: Diagnosis not present

## 2014-02-14 DIAGNOSIS — Z23 Encounter for immunization: Secondary | ICD-10-CM | POA: Diagnosis not present

## 2014-02-19 ENCOUNTER — Ambulatory Visit: Payer: Medicare Other | Admitting: Nurse Practitioner

## 2014-02-26 ENCOUNTER — Encounter: Payer: Self-pay | Admitting: Nurse Practitioner

## 2014-02-26 ENCOUNTER — Ambulatory Visit (INDEPENDENT_AMBULATORY_CARE_PROVIDER_SITE_OTHER): Payer: Medicare Other | Admitting: Nurse Practitioner

## 2014-02-26 VITALS — BP 144/62 | HR 96 | Temp 98.1°F | Resp 13 | Ht 63.0 in | Wt 133.8 lb

## 2014-02-26 DIAGNOSIS — E785 Hyperlipidemia, unspecified: Secondary | ICD-10-CM | POA: Diagnosis not present

## 2014-02-26 DIAGNOSIS — M25551 Pain in right hip: Secondary | ICD-10-CM

## 2014-02-26 NOTE — Progress Notes (Signed)
Patient ID: Rachel Morris, female   DOB: 22-Jun-1930, 80 y.o.   MRN: [id number]    PCP: Hollace Kinnier, DO  Allergies  Allergen Reactions  . Augmentin [Amoxicillin-Pot Clavulanate]   . Brandy [Alcohol]   . Omnicef [Cefdinir]   . Alprazolam Rash  . Ceftin [Cefuroxime Axetil] Rash  . Doxycycline Rash  . Eryc [Erythromycin] Rash  . Penicillins Rash    Chief Complaint  Patient presents with  . Acute Visit    Patient wants to know if a Diagnosis was ever made since her x-rays for the pain in right hip and down leg.     HPI: Patient is a 80 y.o. female seen in the office today due to some question.  Was wanting to know if she should take fish oil, it was on sale so she bough some but does not want to take it if she does not need it.  Wanted to know if there was a diagnosis due to her right hip pain. She got therapy 2 years ago and did not know what the diagnosis was from the xray from 2 years ago.  Having increased hip pain with chair yoga and tai chi Trying to do stretches that were given to her by PT for back pain, recommended the tens unit 2 years ago and she bough it but now does not know how to use.  Having pain in her groin when she walks, sits or does exercises her gait has became worse due to her pain- reports limp  Review of Systems:  Review of Systems  Constitutional: Negative for activity change, appetite change, fatigue and unexpected weight change.  HENT: Negative for congestion and hearing loss.   Eyes: Negative.   Respiratory: Negative for cough and shortness of breath.   Cardiovascular: Negative for chest pain, palpitations and leg swelling.  Gastrointestinal: Negative for abdominal pain, diarrhea and constipation.  Genitourinary: Negative for dysuria and difficulty urinating.  Musculoskeletal: Positive for arthralgias, gait problem and myalgias.       Right lower back and right hip pain, affecting gait   Skin: Negative for color change and wound.  Neurological:  Negative for dizziness and weakness.  Psychiatric/Behavioral: Negative for behavioral problems, confusion and agitation. The patient is nervous/anxious.     Past Medical History  Diagnosis Date  . Lyme borreliosis   . Arthritis     RIGHT LEG  . Cancer     FACE/NOSE -SKIN  . Hyperlipidemia LDL goal < 100   . Reflux esophagitis   . Anxiety state, unspecified   . Allergic rhinitis due to pollen   . Cystocele, midline   . Other atopic dermatitis and related conditions   . Lumbago   . Disorder of bone and cartilage, unspecified   . Other dysphagia   . Senile osteoporosis    Past Surgical History  Procedure Laterality Date  . Hernia repair Bilateral   . Appendectomy  1950   Social History:   reports that she has never smoked. She does not have any smokeless tobacco history on file. She reports that she drinks alcohol. She reports that she does not use illicit drugs.  No family history on file.  Medications: Patient's Medications  New Prescriptions   No medications on file  Previous Medications   CHOLECALCIFEROL (VITAMIN D) 1000 UNITS TABLET    Take 1,000 Units by mouth daily.   OMEGA 3 1000 MG CAPS    Take by mouth. Take one tablet by mouth once daily  VAGIFEM 10 MCG TABS VAGINAL TABLET    INSERT 1 TABLET VAGINALLY TWICE A WEEK.  Modified Medications   No medications on file  Discontinued Medications   CETIRIZINE HCL 10 MG CAPS    Take 10 mg by mouth daily.   OXYMETAZOLINE (AFRIN 12 HOUR) 0.05 % NASAL SPRAY    Place 1 spray into both nostrils every morning.     Physical Exam:  Filed Vitals:   02/26/14 1304  BP: 144/62  Pulse: 96  Temp: 98.1 F (36.7 C)  TempSrc: Oral  Resp: 13  Height: _0  (1.6 m)  Weight: 133 lb 12.8 oz (60.691 kg)    Physical Exam  Constitutional: She is oriented to person, place, and time. She appears well-developed and well-nourished. No distress.  Cardiovascular: Normal rate, regular rhythm, normal heart sounds and intact distal  pulses.   Pulmonary/Chest: Effort normal and breath sounds normal. No respiratory distress.  Abdominal: Soft. Bowel sounds are normal. She exhibits no distension and no mass. There is no tenderness.  Musculoskeletal: Normal range of motion. She exhibits tenderness (to right lower spine and right hip on external rotation).  Neurological: She is alert and oriented to person, place, and time.  Skin: Skin is warm and dry.  Psychiatric: She has a normal mood and affect.    Labs reviewed: Basic Metabolic Panel:  Recent Labs  09/25/13 0817  NA 138  K 4.3  CL 99  CO2 25  GLUCOSE 76  BUN 15  CREATININE 0.72  CALCIUM 10.3   Liver Function Tests:  Recent Labs  09/25/13 0817  AST 17  ALT 15  ALKPHOS 97  BILITOT 0.3  PROT 6.7   No results found for this basename: LIPASE, AMYLASE,  in the last 8760 hours No results found for this basename: AMMONIA,  in the last 8760 hours CBC:  Recent Labs  09/25/13 0817  WBC 7.4  NEUTROABS 3.9  HGB 13.7  HCT 43.2  MCV 97   Lipid Panel:  Recent Labs  09/25/13 0817  HDL 92  LDLCALC 117*  TRIG 124  CHOLHDL 2.5   TSH: No results found for this basename: TSH,  in the last 8760 hours A1C: No results found for this basename: HGBA1C     Assessment/Plan 1. Hip pain, right -went over hip xray results from 2013 -may use tylenol 650 mg every 6-8 hours as needed -will consult Pt/OT to evaluate and treat, also to help with TENS unit -Rx given [facility] to [facility]   2. Hyperlipidemia with target LDL less than 100 -to cont lifestyle modification, pt does not wish to take supplements unless necessary for lipids (thought fish oil was for joints)

## 2014-02-26 NOTE — Patient Instructions (Signed)
For HIP Will consult therapy to do evaluation and therapy   Tylenol 650 mg three times daily as needed for pain

## 2014-03-02 ENCOUNTER — Encounter: Payer: Self-pay | Admitting: Nurse Practitioner

## 2014-03-10 DIAGNOSIS — M6281 Muscle weakness (generalized): Secondary | ICD-10-CM | POA: Diagnosis not present

## 2014-03-10 DIAGNOSIS — M25551 Pain in right hip: Secondary | ICD-10-CM | POA: Diagnosis not present

## 2014-03-13 DIAGNOSIS — M25551 Pain in right hip: Secondary | ICD-10-CM | POA: Diagnosis not present

## 2014-03-13 DIAGNOSIS — M6281 Muscle weakness (generalized): Secondary | ICD-10-CM | POA: Diagnosis not present

## 2014-03-17 DIAGNOSIS — M25551 Pain in right hip: Secondary | ICD-10-CM | POA: Diagnosis not present

## 2014-03-17 DIAGNOSIS — M6281 Muscle weakness (generalized): Secondary | ICD-10-CM | POA: Diagnosis not present

## 2014-03-19 DIAGNOSIS — M25551 Pain in right hip: Secondary | ICD-10-CM | POA: Diagnosis not present

## 2014-03-19 DIAGNOSIS — M6281 Muscle weakness (generalized): Secondary | ICD-10-CM | POA: Diagnosis not present

## 2014-03-22 DIAGNOSIS — M6281 Muscle weakness (generalized): Secondary | ICD-10-CM | POA: Diagnosis not present

## 2014-03-22 DIAGNOSIS — M25551 Pain in right hip: Secondary | ICD-10-CM | POA: Diagnosis not present

## 2014-03-25 DIAGNOSIS — M25551 Pain in right hip: Secondary | ICD-10-CM | POA: Diagnosis not present

## 2014-03-25 DIAGNOSIS — M6281 Muscle weakness (generalized): Secondary | ICD-10-CM | POA: Diagnosis not present

## 2014-04-01 DIAGNOSIS — M25551 Pain in right hip: Secondary | ICD-10-CM | POA: Diagnosis not present

## 2014-04-01 DIAGNOSIS — M6281 Muscle weakness (generalized): Secondary | ICD-10-CM | POA: Diagnosis not present

## 2014-04-02 ENCOUNTER — Other Ambulatory Visit: Payer: Medicare Other

## 2014-04-02 DIAGNOSIS — E785 Hyperlipidemia, unspecified: Secondary | ICD-10-CM | POA: Diagnosis not present

## 2014-04-03 DIAGNOSIS — M6281 Muscle weakness (generalized): Secondary | ICD-10-CM | POA: Diagnosis not present

## 2014-04-03 DIAGNOSIS — M25551 Pain in right hip: Secondary | ICD-10-CM | POA: Diagnosis not present

## 2014-04-03 LAB — LIPID PANEL
Chol/HDL Ratio: 2.4 ratio units (ref 0.0–4.4)
Cholesterol, Total: 229 mg/dL — ABNORMAL HIGH (ref 100–199)
HDL: 95 mg/dL (ref >39)
LDL Calculated: 106 mg/dL — ABNORMAL HIGH (ref 0–99)
Triglycerides: 138 mg/dL (ref 0–149)
VLDL Cholesterol Cal: 28 mg/dL (ref 5–40)

## 2014-04-06 ENCOUNTER — Encounter: Payer: Self-pay | Admitting: Internal Medicine

## 2014-04-06 ENCOUNTER — Ambulatory Visit (INDEPENDENT_AMBULATORY_CARE_PROVIDER_SITE_OTHER): Payer: Medicare Other | Admitting: Internal Medicine

## 2014-04-06 VITALS — BP 130/78 | HR 52 | Temp 98.1°F | Resp 10 | Ht 63.0 in | Wt 134.0 lb

## 2014-04-06 DIAGNOSIS — M1731 Unilateral post-traumatic osteoarthritis, right knee: Secondary | ICD-10-CM | POA: Diagnosis not present

## 2014-04-06 DIAGNOSIS — M25551 Pain in right hip: Secondary | ICD-10-CM | POA: Diagnosis not present

## 2014-04-06 DIAGNOSIS — M5441 Lumbago with sciatica, right side: Secondary | ICD-10-CM

## 2014-04-06 DIAGNOSIS — E785 Hyperlipidemia, unspecified: Secondary | ICD-10-CM

## 2014-04-06 DIAGNOSIS — Z23 Encounter for immunization: Secondary | ICD-10-CM

## 2014-04-06 DIAGNOSIS — M810 Age-related osteoporosis without current pathological fracture: Secondary | ICD-10-CM | POA: Diagnosis not present

## 2014-04-06 NOTE — Addendum Note (Signed)
Addended by: Ripley Fraise on: 04/06/2014 10:57 AM   Modules accepted: Orders

## 2014-04-06 NOTE — Progress Notes (Signed)
Patient ID: Rachel Morris, female   DOB: 23-Dec-1930, 80 y.o.   MRN: [id number]   Location:  [facility]  Code Status: DNR, need to discuss advance directives  Allergies  Allergen Reactions  . Augmentin [Amoxicillin-Pot Clavulanate]   . Brandy [Alcohol]   . Omnicef [Cefdinir]   . Alprazolam Rash  . Ceftin [Cefuroxime Axetil] Rash  . Doxycycline Rash  . Eryc [Erythromycin] Rash  . Penicillins Rash    Chief Complaint  Patient presents with  . Medical Management of Chronic Issues    6 month follow-up, discuss labs completed on 04/02/14  . Immunizations    Discuss prevnar- would like to discuss first     HPI: Patient is a 80 y.o.  seen in the office today for medical mgt of chronic diseases.  She had been seen by Janett Billow due to her right hip pain and therapy and tens unit were ordered.  Loves the therapists.  Had injury to right leg at 80yo when a horse fell on it.  Hurts right lateral knee and hip.  Had started exercising daily doing yoga and tai chi and aquacize, but leg was getting worse.  Had inflammation and knots of right thigh.  They are still taking some knots out.  Has TENS unit and they are going to teach her to use it tomorrow.  Plans to keep working with them.  Now only doing the exercises they give her.    She is doing lifestyle modification for her cholesterol.  She doesn't know why her numbers are getting better when she stopped exercise a couple of weeks ago.  Still adjusting to life w/o her husband and her dog.  Also moving from the big house to [place] apt.  Family is not as close y lately.     Asks about prevnar vaccine.    Review of Systems:  Review of Systems  Constitutional: Negative for fever.  Eyes: Negative for blurred vision.  Respiratory: Negative for shortness of breath.   Cardiovascular: Negative for chest pain and leg swelling.  Gastrointestinal: Negative for abdominal pain, constipation, blood in stool and  melena.  Genitourinary: Negative for dysuria.  Musculoskeletal: Positive for myalgias, back pain and joint pain. Negative for falls.  Skin: Negative for rash.  Neurological: Negative for dizziness and headaches.  Endo/Heme/Allergies: Does not bruise/bleed easily.  Psychiatric/Behavioral: Negative for memory loss. The patient is nervous/anxious.     Past Medical History  Diagnosis Date  . Lyme borreliosis   . Arthritis     RIGHT LEG  . Cancer     FACE/NOSE -SKIN  . Hyperlipidemia LDL goal < 100   . Reflux esophagitis   . Anxiety state, unspecified   . Allergic rhinitis due to pollen   . Cystocele, midline   . Other atopic dermatitis and related conditions   . Lumbago   . Disorder of bone and cartilage, unspecified   . Other dysphagia   . Senile osteoporosis     Past Surgical History  Procedure Laterality Date  . Hernia repair Bilateral   . Appendectomy  1950    Social History:   reports that she has never smoked. She does not have any smokeless tobacco history on file. She reports that she drinks alcohol. She reports that she does not use illicit drugs.  No family history on file.  Medications: Patient's Medications  New Prescriptions   No medications on file  Previous Medications   CHOLECALCIFEROL (VITAMIN D) 1000  UNITS TABLET    Take 1,000 Units by mouth daily.   VAGIFEM 10 MCG TABS VAGINAL TABLET    INSERT 1 TABLET VAGINALLY TWICE A WEEK.  Modified Medications   No medications on file  Discontinued Medications   No medications on file     Physical Exam: Filed Vitals:   04/06/14 0959  BP: 130/78  Pulse: 52  Temp: 98.1 F (36.7 C)  TempSrc: Oral  Resp: 10  Height: $Remove'5\' 3"'AhtirKP$  (1.6 m)  Weight: 134 lb (60.782 kg)  SpO2: 93%  Physical Exam  Constitutional: She is oriented to person, place, and time. She appears well-developed and well-nourished. No distress.  Cardiovascular: Normal rate, regular rhythm, normal heart sounds and intact distal pulses.     Pulmonary/Chest: Effort normal and breath sounds normal. No respiratory distress.  Abdominal: Soft. Bowel sounds are normal. She exhibits no distension and no mass. There is no tenderness.  Musculoskeletal: Normal range of motion. She exhibits no tenderness.  Neurological: She is alert and oriented to person, place, and time.  Skin: Skin is warm and dry. There is pallor.  Psychiatric: She has a normal mood and affect.  ,  Labs reviewed: Basic Metabolic Panel:  Recent Labs  09/25/13 0817  NA 138  K 4.3  CL 99  CO2 25  GLUCOSE 76  BUN 15  CREATININE 0.72  CALCIUM 10.3   Liver Function Tests:  Recent Labs  09/25/13 0817  AST 17  ALT 15  ALKPHOS 97  BILITOT 0.3  PROT 6.7   No results for input(s): LIPASE, AMYLASE in the last 8760 hours. No results for input(s): AMMONIA in the last 8760 hours. CBC:  Recent Labs  09/25/13 0817  WBC 7.4  NEUTROABS 3.9  HGB 13.7  HCT 43.2  MCV 97   Lipid Panel:  Recent Labs  09/25/13 0817 04/02/14 0811  HDL 92 95  LDLCALC 117* 106*  TRIG 124 138  CHOLHDL 2.5 2.4   Assessment/Plan 1. Hip pain, right -does not seem to be bursitis -I actually question if her hip is the true problem-seems her pain is radiating from her back (right SI joint is the most painful area and no tenderness over the right trochanteric bursa) -cont PT, OT with [facility] 2. Midline low back pain with right-sided sciatica -cont therapy 3. Hyperlipidemia with target LDL less than 100 -encouraged her to resume the walking in the water along with her therapeutic exercises that were recommended by [facility] -f/u flp and cmp 4. Post-traumatic osteoarthritis of right knee -cont therapy and exercise, prn tylenol 5. Senile osteoporosis -refused prolia as recommended by Dr. Toney Rakes -continue Vitamin D only -Encouraged weightbearing exercise 6. Need for vaccination with 13-polyvalent pneumococcal conjugate vaccine -prevnar given  today  Labs/tests ordered:  Orders Placed This Encounter  Procedures  . CBC With differential/Platelet    Standing Status: Future     Number of Occurrences:      Standing Expiration Date: 04/07/2015  . Comprehensive metabolic panel    Standing Status: Future     Number of Occurrences:      Standing Expiration Date: 04/07/2015    Order Specific Question:  Has the patient fasted?    Answer:  Yes  . Lipid panel    Standing Status: Future     Number of Occurrences:      Standing Expiration Date: 04/07/2015    Order Specific Question:  Has the patient fasted?    Answer:  Yes    Next  appt:  6 mos   Jeanifer Halliday L. Alleyne Lac, D.O. [facility] [address], [place] [address] Cell Phone (Mon-Fri 8am-5pm):  [phone] On Call:  [phone] & follow prompts after 5pm & weekends Office Phone:  [phone] Office Fax:  [phone]

## 2014-04-07 DIAGNOSIS — M6281 Muscle weakness (generalized): Secondary | ICD-10-CM | POA: Diagnosis not present

## 2014-04-07 DIAGNOSIS — M25551 Pain in right hip: Secondary | ICD-10-CM | POA: Diagnosis not present

## 2014-04-08 DIAGNOSIS — M6281 Muscle weakness (generalized): Secondary | ICD-10-CM | POA: Diagnosis not present

## 2014-04-08 DIAGNOSIS — M25551 Pain in right hip: Secondary | ICD-10-CM | POA: Diagnosis not present

## 2014-04-13 DIAGNOSIS — M6281 Muscle weakness (generalized): Secondary | ICD-10-CM | POA: Diagnosis not present

## 2014-04-13 DIAGNOSIS — M25551 Pain in right hip: Secondary | ICD-10-CM | POA: Diagnosis not present

## 2014-04-17 DIAGNOSIS — M25551 Pain in right hip: Secondary | ICD-10-CM | POA: Diagnosis not present

## 2014-04-17 DIAGNOSIS — M6281 Muscle weakness (generalized): Secondary | ICD-10-CM | POA: Diagnosis not present

## 2014-04-19 DIAGNOSIS — M6281 Muscle weakness (generalized): Secondary | ICD-10-CM | POA: Diagnosis not present

## 2014-04-19 DIAGNOSIS — M25551 Pain in right hip: Secondary | ICD-10-CM | POA: Diagnosis not present

## 2014-04-21 DIAGNOSIS — M6281 Muscle weakness (generalized): Secondary | ICD-10-CM | POA: Diagnosis not present

## 2014-04-21 DIAGNOSIS — M25551 Pain in right hip: Secondary | ICD-10-CM | POA: Diagnosis not present

## 2014-04-25 DIAGNOSIS — M6281 Muscle weakness (generalized): Secondary | ICD-10-CM | POA: Diagnosis not present

## 2014-04-25 DIAGNOSIS — M25551 Pain in right hip: Secondary | ICD-10-CM | POA: Diagnosis not present

## 2014-04-28 DIAGNOSIS — M6281 Muscle weakness (generalized): Secondary | ICD-10-CM | POA: Diagnosis not present

## 2014-04-28 DIAGNOSIS — M25551 Pain in right hip: Secondary | ICD-10-CM | POA: Diagnosis not present

## 2014-05-01 DIAGNOSIS — M6281 Muscle weakness (generalized): Secondary | ICD-10-CM | POA: Diagnosis not present

## 2014-05-01 DIAGNOSIS — M25551 Pain in right hip: Secondary | ICD-10-CM | POA: Diagnosis not present

## 2014-05-11 DIAGNOSIS — M25551 Pain in right hip: Secondary | ICD-10-CM | POA: Diagnosis not present

## 2014-05-11 DIAGNOSIS — M6281 Muscle weakness (generalized): Secondary | ICD-10-CM | POA: Diagnosis not present

## 2014-05-13 DIAGNOSIS — L720 Epidermal cyst: Secondary | ICD-10-CM | POA: Diagnosis not present

## 2014-05-13 DIAGNOSIS — D2272 Melanocytic nevi of left lower limb, including hip: Secondary | ICD-10-CM | POA: Diagnosis not present

## 2014-05-13 DIAGNOSIS — L821 Other seborrheic keratosis: Secondary | ICD-10-CM | POA: Diagnosis not present

## 2014-05-13 DIAGNOSIS — Z85828 Personal history of other malignant neoplasm of skin: Secondary | ICD-10-CM | POA: Diagnosis not present

## 2014-05-26 DIAGNOSIS — M25551 Pain in right hip: Secondary | ICD-10-CM | POA: Diagnosis not present

## 2014-05-26 DIAGNOSIS — M6281 Muscle weakness (generalized): Secondary | ICD-10-CM | POA: Diagnosis not present

## 2014-06-01 DIAGNOSIS — M25551 Pain in right hip: Secondary | ICD-10-CM | POA: Diagnosis not present

## 2014-06-01 DIAGNOSIS — M6281 Muscle weakness (generalized): Secondary | ICD-10-CM | POA: Diagnosis not present

## 2014-06-04 DIAGNOSIS — H43813 Vitreous degeneration, bilateral: Secondary | ICD-10-CM | POA: Diagnosis not present

## 2014-06-04 DIAGNOSIS — Z961 Presence of intraocular lens: Secondary | ICD-10-CM | POA: Diagnosis not present

## 2014-06-04 DIAGNOSIS — H04123 Dry eye syndrome of bilateral lacrimal glands: Secondary | ICD-10-CM | POA: Diagnosis not present

## 2014-06-04 DIAGNOSIS — H10413 Chronic giant papillary conjunctivitis, bilateral: Secondary | ICD-10-CM | POA: Diagnosis not present

## 2014-06-04 DIAGNOSIS — H40053 Ocular hypertension, bilateral: Secondary | ICD-10-CM | POA: Diagnosis not present

## 2014-06-19 DIAGNOSIS — M25551 Pain in right hip: Secondary | ICD-10-CM | POA: Diagnosis not present

## 2014-06-19 DIAGNOSIS — M6281 Muscle weakness (generalized): Secondary | ICD-10-CM | POA: Diagnosis not present

## 2014-07-29 ENCOUNTER — Telehealth: Payer: Self-pay

## 2014-07-29 NOTE — Telephone Encounter (Signed)
Pt is a resident at [facility]. It may be an insurance issue, they allow so many visits per problem. She can always pay out of pocket for further therapy if she wants it or can make an OV for further discussion/evaluation.

## 2014-07-29 NOTE — Telephone Encounter (Signed)
Patient called requesting to speak directly with Janett Billow. Patient states she thinks she needs more physical therapy and would like to talk this over with Janett Billow.   Please advise, patient is at [facility]

## 2014-08-11 ENCOUNTER — Ambulatory Visit (INDEPENDENT_AMBULATORY_CARE_PROVIDER_SITE_OTHER): Payer: Medicare Other | Admitting: Women's Health

## 2014-08-11 ENCOUNTER — Encounter: Payer: Self-pay | Admitting: Women's Health

## 2014-08-11 ENCOUNTER — Other Ambulatory Visit: Payer: Self-pay | Admitting: Women's Health

## 2014-08-11 VITALS — BP 140/80 | Ht 63.0 in | Wt 131.0 lb

## 2014-08-11 DIAGNOSIS — R350 Frequency of micturition: Secondary | ICD-10-CM | POA: Diagnosis not present

## 2014-08-11 DIAGNOSIS — N3000 Acute cystitis without hematuria: Secondary | ICD-10-CM | POA: Diagnosis not present

## 2014-08-11 LAB — URINALYSIS, ROUTINE W REFLEX MICROSCOPIC
Bilirubin Urine: NEGATIVE
Glucose, UA: NEGATIVE mg/dL
Ketones, ur: NEGATIVE mg/dL
NITRITE: NEGATIVE
Protein, ur: NEGATIVE mg/dL
Specific Gravity, Urine: <1.005 — ABNORMAL LOW (ref 1.005–1.030)
Urobilinogen, UA: 0.2 mg/dL (ref 0.0–1.0)
pH: 5.5 (ref 5.0–8.0)

## 2014-08-11 LAB — WET PREP FOR TRICH, YEAST, CLUE
CLUE CELLS WET PREP: NONE SEEN
TRICH WET PREP: NONE SEEN
YEAST WET PREP: NONE SEEN

## 2014-08-11 LAB — URINALYSIS, MICROSCOPIC ONLY
CRYSTALS: NONE SEEN
Casts: NONE SEEN

## 2014-08-11 MED ORDER — SULFAMETHOXAZOLE-TRIMETHOPRIM 800-160 MG PO TABS: 1.0000 | ORAL_TABLET | Freq: Two times a day (BID) | ORAL | Status: DC

## 2014-08-11 NOTE — Patient Instructions (Signed)

## 2014-08-11 NOTE — Progress Notes (Signed)
Patient ID: Rachel Morris, female   DOB: 1930-12-06, 81 y.o.   MRN: [id number] Presents for suspected UTI. Increased urinary frequency, burning, and itching since Thursday. Uses pessary and admits she forgot to clean it. History of 2-3 UTIs in lifetime, with similar symptoms. Took cranberry pills and cranberry juice, which somewhat alleviated symptoms. Denies increased urinary urgency, odor, fever, abdominal pain, blood in urine, or vaginal discharge. Postmenopausal/not sexually active/no HRT/no bleeding. Reports numerous allergies but able to tolerate sulfa products.  Exam: Appears well. External genitalia mild erythema at introitus, ring with support pessary removed with ease washed and replaced. Speculum exam no erosion or erythema noted, wet prep negative.  Urinalysis - trace of blood and moderate leukocytes, 11-20 WBCs, 0-3 RBCs, few bacteria.[name]   Possible UTI  Plan: Bactrim DS 1 by mouth twice daily for 3 days, will try to hold off on taking until results of urine culture are available, will call with results. Aware of proper pessary maintenance will remove, washed and replaced monthly.

## 2014-08-13 ENCOUNTER — Telehealth: Payer: Self-pay

## 2014-08-13 NOTE — Telephone Encounter (Signed)
Patient informed.  She had already decided the same and has already been to fill it. She said she realized she was not feeling so good after all. I told her to call if not 100% after finishing the medication.

## 2014-08-13 NOTE — Telephone Encounter (Signed)
Left message to call me.

## 2014-08-13 NOTE — Telephone Encounter (Signed)
I think best to treat, take the Septra for 3 days should clear all her symptoms. Have her call if not 100% after finishing.

## 2014-08-13 NOTE — Telephone Encounter (Signed)
-----   Message from Huel Cote, NP sent at 08/13/2014  7:27 AM EDT ----- Please call and review urine culture had some bacteria, ask if she is feeling better if not take the Bactrim that was ordered at office visit for 3 days.

## 2014-08-13 NOTE — Telephone Encounter (Signed)
Patient said she is feeling better but still not 100%. She said yesterday she was in the bed all day.  Today feeling a lot better. She said since she is better she is inclined not to take the Rx but on the other hand she does not want this to drag on forever. It has already been a week. I told her I would see what you recommended.

## 2014-08-14 LAB — URINE CULTURE: Colony Count: 50000

## 2014-09-15 ENCOUNTER — Encounter: Payer: Medicare Other | Admitting: Gynecology

## 2014-09-21 ENCOUNTER — Ambulatory Visit (INDEPENDENT_AMBULATORY_CARE_PROVIDER_SITE_OTHER): Payer: Medicare Other | Admitting: Women's Health

## 2014-09-21 ENCOUNTER — Encounter: Payer: Self-pay | Admitting: Women's Health

## 2014-09-21 VITALS — BP 134/80 | Ht 63.5 in | Wt 130.6 lb

## 2014-09-21 DIAGNOSIS — Z4689 Encounter for fitting and adjustment of other specified devices: Secondary | ICD-10-CM

## 2014-09-21 DIAGNOSIS — Z01411 Encounter for gynecological examination (general) (routine) with abnormal findings: Secondary | ICD-10-CM

## 2014-09-21 NOTE — Patient Instructions (Signed)
Health Recommendations for Postmenopausal Women Respected and ongoing research has looked at the most common causes of death, disability, and poor quality of life in postmenopausal women. The causes include heart disease, diseases of blood vessels, diabetes, depression, cancer, and bone loss (osteoporosis). Many things can be done to help lower the chances of developing these and other common problems. CARDIOVASCULAR DISEASE Heart Disease: A heart attack is a medical emergency. Know the signs and symptoms of a heart attack. Below are things women can do to reduce their risk for heart disease.   Do not smoke. If you smoke, quit.  Aim for a healthy weight. Being overweight causes many preventable deaths. Eat a healthy and balanced diet and drink an adequate amount of liquids.  Get moving. Make a commitment to be more physically active. Aim for 30 minutes of activity on most, if not all days of the week.  Eat for heart health. Choose a diet that is low in saturated fat and cholesterol and eliminate trans fat. Include whole grains, vegetables, and fruits. Read and understand the labels on food containers before buying.  Know your numbers. Ask your caregiver to check your blood pressure, cholesterol (total, HDL, LDL, triglycerides) and blood glucose. Work with your caregiver on improving your entire clinical picture.  High blood pressure. Limit or stop your table salt intake (try salt substitute and food seasonings). Avoid salty foods and drinks. Read labels on food containers before buying. Eating well and exercising can help control high blood pressure. STROKE  Stroke is a medical emergency. Stroke may be the result of a blood clot in a blood vessel in the brain or by a brain hemorrhage (bleeding). Know the signs and symptoms of a stroke. To lower the risk of developing a stroke:  Avoid fatty foods.  Quit smoking.  Control your diabetes, blood pressure, and irregular heart rate. THROMBOPHLEBITIS  (BLOOD CLOT) OF THE LEG  Becoming overweight and leading a stationary lifestyle may also contribute to developing blood clots. Controlling your diet and exercising will help lower the risk of developing blood clots. CANCER SCREENING  Breast Cancer: Take steps to reduce your risk of breast cancer.  You should practice "breast self-awareness." This means understanding the normal appearance and feel of your breasts and should include breast self-examination. Any changes detected, no matter how small, should be reported to your caregiver.  After age 4, you should have a clinical breast exam (CBE) every year.  Starting at age 48, you should consider having a mammogram (breast X-ray) every year.  If you have a family history of breast cancer, talk to your caregiver about genetic screening.  If you are at high risk for breast cancer, talk to your caregiver about having an MRI and a mammogram every year.  Intestinal or Stomach Cancer: Tests to consider are a rectal exam, fecal occult blood, sigmoidoscopy, and colonoscopy. Women who are high risk may need to be screened at an earlier age and more often.  Cervical Cancer:  Beginning at age 72, you should have a Pap test every 3 years as long as the past 3 Pap tests have been normal.  If you have had past treatment for cervical cancer or a condition that could lead to cancer, you need Pap tests and screening for cancer for at least 20 years after your treatment.  If you had a hysterectomy for a problem that was not cancer or a condition that could lead to cancer, then you no longer need Pap tests.  If you are between ages 65 and 70, and you have had normal Pap tests going back 10 years, you no longer need Pap tests.  If Pap tests have been discontinued, risk factors (such as a new sexual partner) need to be reassessed to determine if screening should be resumed.  Some medical problems can increase the chance of getting cervical cancer. In these  cases, your caregiver may recommend more frequent screening and Pap tests.  Uterine Cancer: If you have vaginal bleeding after reaching menopause, you should notify your caregiver.  Ovarian Cancer: Other than yearly pelvic exams, there are no reliable tests available to screen for ovarian cancer at this time except for yearly pelvic exams.  Lung Cancer: Yearly chest X-rays can detect lung cancer and should be done on high risk women, such as cigarette smokers and women with chronic lung disease (emphysema).  Skin Cancer: A complete body skin exam should be done at your yearly examination. Avoid overexposure to the sun and ultraviolet light lamps. Use a strong sun block cream when in the sun. All of these things are important for lowering the risk of skin cancer. MENOPAUSE Menopause Symptoms: Hormone therapy products are effective for treating symptoms associated with menopause:  Moderate to severe hot flashes.  Night sweats.  Mood swings.  Headaches.  Tiredness.  Loss of sex drive.  Insomnia.  Other symptoms. Hormone replacement carries certain risks, especially in older women. Women who use or are thinking about using estrogen or estrogen with progestin treatments should discuss that with their caregiver. Your caregiver will help you understand the benefits and risks. The ideal dose of hormone replacement therapy is not known. The Food and Drug Administration (FDA) has concluded that hormone therapy should be used only at the lowest doses and for the shortest amount of time to reach treatment goals.  OSTEOPOROSIS Protecting Against Bone Loss and Preventing Fracture If you use hormone therapy for prevention of bone loss (osteoporosis), the risks for bone loss must outweigh the risk of the therapy. Ask your caregiver about other medications known to be safe and effective for preventing bone loss and fractures. To guard against bone loss or fractures, the following is recommended:  If  you are younger than age 50, take 1000 mg of calcium and at least 600 mg of Vitamin D per day.  If you are older than age 50 but younger than age 70, take 1200 mg of calcium and at least 600 mg of Vitamin D per day.  If you are older than age 70, take 1200 mg of calcium and at least 800 mg of Vitamin D per day. Smoking and excessive alcohol intake increases the risk of osteoporosis. Eat foods rich in calcium and vitamin D and do weight bearing exercises several times a week as your caregiver suggests. DIABETES Diabetes Mellitus: If you have type I or type 2 diabetes, you should keep your blood sugar under control with diet, exercise, and recommended medication. Avoid starchy and fatty foods, and too many sweets. Being overweight can make diabetes control more difficult. COGNITION AND MEMORY Cognition and Memory: Menopausal hormone therapy is not recommended for the prevention of cognitive disorders such as Alzheimer's disease or memory loss.  DEPRESSION  Depression may occur at any age, but it is common in elderly women. This may be because of physical, medical, social (loneliness), or financial problems and needs. If you are experiencing depression because of medical problems and control of symptoms, talk to your caregiver about this. Physical   activity and exercise may help with mood and sleep. Community and volunteer involvement may improve your sense of value and worth. If you have depression and you feel that the problem is getting worse or becoming severe, talk to your caregiver about which treatment options are best for you. ACCIDENTS  Accidents are common and can be serious in elderly woman. Prepare your house to prevent accidents. Eliminate throw rugs, place hand bars in bath, shower, and toilet areas. Avoid wearing high heeled shoes or walking on wet, snowy, and icy areas. Limit or stop driving if you have vision or hearing problems, or if you feel you are unsteady with your movements and  reflexes. HEPATITIS C Hepatitis C is a type of viral infection affecting the liver. It is spread mainly through contact with blood from an infected person. It can be treated, but if left untreated, it can lead to severe liver damage over the years. Many people who are infected do not know that the virus is in their blood. If you are a "baby-boomer", it is recommended that you have one screening test for Hepatitis C. IMMUNIZATIONS  Several immunizations are important to consider having during your senior years, including:   Tetanus, diphtheria, and pertussis booster shot.  Influenza every year before the flu season begins.  Pneumonia vaccine.  Shingles vaccine.  Others, as indicated based on your specific needs. Talk to your caregiver about these. Document Released: 06/09/2005 Document Revised: 09/01/2013 Document Reviewed: 02/03/2008 ExitCare Patient Information 2015 [employer]. This information is not intended to replace advice given to you by your health care provider. Make sure you discuss any questions you have with your health care provider. Exercise to Stay Healthy Exercise helps you become and stay healthy. EXERCISE IDEAS AND TIPS Choose exercises that:  You enjoy.  Fit into your day. You do not need to exercise really hard to be healthy. You can do exercises at a slow or medium level and stay healthy. You can:  Stretch before and after working out.  Try yoga, Pilates, or tai chi.  Lift weights.  Walk fast, swim, jog, run, climb stairs, bicycle, dance, or rollerskate.  Take aerobic classes. Exercises that burn about 150 calories:  Running 1  miles in 15 minutes.  Playing volleyball for 45 to 60 minutes.  Washing and waxing a car for 45 to 60 minutes.  Playing touch football for 45 minutes.  Walking 1  miles in 35 minutes.  Pushing a stroller 1  miles in 30 minutes.  Playing basketball for 30 minutes.  Raking leaves for 30 minutes.  Bicycling 5  miles in 30 minutes.  Walking 2 miles in 30 minutes.  Dancing for 30 minutes.  Shoveling snow for 15 minutes.  Swimming laps for 20 minutes.  Walking up stairs for 15 minutes.  Bicycling 4 miles in 15 minutes.  Gardening for 30 to 45 minutes.  Jumping rope for 15 minutes.  Washing windows or floors for 45 to 60 minutes. Document Released: 05/20/2010 Document Revised: 07/10/2011 Document Reviewed: 05/20/2010 ExitCare Patient Information 2015 [employer]. This information is not intended to replace advice given to you by your health care provider. Make sure you discuss any questions you have with your health care provider.  

## 2014-09-21 NOTE — Progress Notes (Signed)
Rachel Morris 1931/04/27 [id number]    History:    Presents for stent pelvic exam. Normal Pap and mammogram history. Doing well with ring pessary with support. 09/2013 DEXA femoral neck -1.9. History of negative colonoscopy. This problem is low back pain, currently in physical therapy. Lives at [place] here from [place] to be closer to her daughter. Widow.  Past medical history, past surgical history, family history and social history were all reviewed and documented in the EPIC chart.  ROS:  A ROS was performed and pertinent positives and negatives are included.  Exam:  Filed Vitals:   09/21/14 0859  BP: 134/80    General appearance:  Normal Thyroid:  Symmetrical, normal in size, without palpable masses or nodularity. Respiratory  Auscultation:  Clear without wheezing or rhonchi Cardiovascular  Auscultation:  Regular rate, without rubs, murmurs or gallops  Edema/varicosities:  Not grossly evident Abdominal  Soft,nontender, without masses, guarding or rebound.  Liver/spleen:  No organomegaly noted  Hernia:  None appreciated  Skin  Inspection:  Grossly normal   Breasts: Examined lying and sitting.     Right: Without masses, retractions, discharge or axillary adenopathy.     Left: Without masses, retractions, discharge or axillary adenopathy. Gentitourinary   Inguinal/mons:  Normal without inguinal adenopathy  External genitalia:  Normal  BUS/Urethra/Skene's glands:  Normal  Vagina:  Normal  Cervix:  Normal  Uterus:   normal in size, shape and contour.  Midline and mobile  Adnexa/parametria:     Rt: Without masses or tenderness.   Lt: Without masses or tenderness.  Anus and perineum: Normal  Digital rectal exam: Normal sphincter tone without palpated masses or tenderness  Assessment/Plan:  81 y.o. WWF for breast and pelvic exam.  Postmenopausal/no HRT/no bleeding Cystocele good relief with ring pessary Labs primary care Reflux  Plan: Ring  pessary with support removed, washed, replaced. No erosion or irritation noted in vaginal vault. Instructed to return every 3 months to have pessary removed and washed. SBE's, annual screening mammogram will repeat this year 3-D tomography history of dense breasts. Reviewed controversy over continued screenings. Home safety, fall prevention and importance of regular exercise reviewed. Current on immunizations.  [place], 1:53 PM 09/21/2014

## 2014-10-07 DIAGNOSIS — L240 Irritant contact dermatitis due to detergents: Secondary | ICD-10-CM | POA: Diagnosis not present

## 2014-10-08 ENCOUNTER — Other Ambulatory Visit: Payer: Medicare Other

## 2014-10-09 ENCOUNTER — Other Ambulatory Visit: Payer: Medicare Other

## 2014-10-09 DIAGNOSIS — E785 Hyperlipidemia, unspecified: Secondary | ICD-10-CM

## 2014-10-10 LAB — LIPID PANEL
Chol/HDL Ratio: 2.8 ratio units (ref 0.0–4.4)
Cholesterol, Total: 233 mg/dL — ABNORMAL HIGH (ref 100–199)
HDL: 84 mg/dL (ref >39)
LDL Calculated: 125 mg/dL — ABNORMAL HIGH (ref 0–99)
Triglycerides: 121 mg/dL (ref 0–149)
VLDL Cholesterol Cal: 24 mg/dL (ref 5–40)

## 2014-10-10 LAB — COMPREHENSIVE METABOLIC PANEL
ALT: 21 IU/L (ref 0–32)
AST: 22 IU/L (ref 0–40)
Albumin/Globulin Ratio: 1.7 (ref 1.1–2.5)
Albumin: 4.1 g/dL (ref 3.5–4.7)
Alkaline Phosphatase: 81 IU/L (ref 39–117)
BUN/Creatinine Ratio: 18 (ref 11–26)
BUN: 12 mg/dL (ref 8–27)
Bilirubin Total: 0.3 mg/dL (ref 0.0–1.2)
CO2: 24 mmol/L (ref 18–29)
Calcium: 10 mg/dL (ref 8.7–10.3)
Chloride: 102 mmol/L (ref 97–108)
Creatinine, Ser: 0.68 mg/dL (ref 0.57–1.00)
GFR calc Af Amer: 95 mL/min/{1.73_m2} (ref >59)
GFR calc non Af Amer: 82 mL/min/{1.73_m2} (ref >59)
Globulin, Total: 2.4 g/dL (ref 1.5–4.5)
Glucose: 80 mg/dL (ref 65–99)
Potassium: 4.3 mmol/L (ref 3.5–5.2)
Sodium: 141 mmol/L (ref 134–144)
Total Protein: 6.5 g/dL (ref 6.0–8.5)

## 2014-10-10 LAB — CBC WITH DIFFERENTIAL
Basophils Absolute: 0 10*3/uL (ref 0.0–0.2)
Basos: 0 %
EOS (ABSOLUTE): 0.2 10*3/uL (ref 0.0–0.4)
Eos: 3 %
Hematocrit: 39.9 % (ref 34.0–46.6)
Hemoglobin: 13.6 g/dL (ref 11.1–15.9)
Immature Grans (Abs): 0 10*3/uL (ref 0.0–0.1)
Immature Granulocytes: 0 %
Lymphocytes Absolute: 2 10*3/uL (ref 0.7–3.1)
Lymphs: 31 %
MCH: 32.5 pg (ref 26.6–33.0)
MCHC: 34.1 g/dL (ref 31.5–35.7)
MCV: 95 fL (ref 79–97)
Monocytes Absolute: 0.6 10*3/uL (ref 0.1–0.9)
Monocytes: 9 %
Neutrophils Absolute: 3.6 10*3/uL (ref 1.4–7.0)
Neutrophils: 57 %
RBC: 4.19 x10E6/uL (ref 3.77–5.28)
RDW: 13.9 % (ref 12.3–15.4)
WBC: 6.4 10*3/uL (ref 3.4–10.8)

## 2014-10-12 ENCOUNTER — Ambulatory Visit: Payer: Medicare Other | Admitting: Internal Medicine

## 2014-10-16 ENCOUNTER — Ambulatory Visit (INDEPENDENT_AMBULATORY_CARE_PROVIDER_SITE_OTHER): Payer: Medicare Other | Admitting: Internal Medicine

## 2014-10-16 ENCOUNTER — Encounter: Payer: Self-pay | Admitting: Internal Medicine

## 2014-10-16 VITALS — BP 158/78 | HR 87 | Temp 98.3°F | Resp 20 | Ht 64.0 in | Wt 131.2 lb

## 2014-10-16 DIAGNOSIS — F4323 Adjustment disorder with mixed anxiety and depressed mood: Secondary | ICD-10-CM | POA: Diagnosis not present

## 2014-10-16 DIAGNOSIS — L509 Urticaria, unspecified: Secondary | ICD-10-CM

## 2014-10-16 DIAGNOSIS — S6721XA Crushing injury of right hand, initial encounter: Secondary | ICD-10-CM | POA: Diagnosis not present

## 2014-10-16 DIAGNOSIS — M5441 Lumbago with sciatica, right side: Secondary | ICD-10-CM | POA: Diagnosis not present

## 2014-10-16 NOTE — Progress Notes (Signed)
Patient ID: Rachel Morris, female   DOB: 27-Jan-1931, 81 y.o.   MRN: [id number]   Location:  [facility]  Goals of Care: Advanced Directive information Does patient have an advance directive?: Yes, Type of Advance Directive: [place];Living will, Does patient want to make changes to advanced directive?: No - Patient declined   Chief Complaint  Patient presents with  . Medical Management of Chronic Issues    Has some questions    HPI: Patient is a 81 y.o. white female seen in the office today for med mgt of chronic diseases.    Says she has finally got an understanding of her adjustment to [place].  Has been overwhelmed with a lot of new people in her life b/c she's a people pleaser.    Misses a friend who got moved to dementia unit.    Last year cholesterol went down when she did tai chi, yoga, aquacize.  When 42 had injury from horse landing on right leg.  Had been to therapy which helped. When her leg flared up.  Had been walking wrong--got lift placed in right shoe larger than left shoe.  Has been doing no fall exercises which cause pain, but if adds prior exercises to it, it helps.  But, overall, her cholesterol has gone up with generally less exercise.  Admits her eating is a little different than it was at home.  Doesn't want to take the medicine for it.   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  Thinks she'll be able to go back to the pool--used to have to go too early.  There will be an afternoon swim program.  Says leg hurts when she starts swimming again.    Third digit on right hand--slammed finger in closet.  It's not growing out--has been going on for 2 mos.    Got back from a trip and broke out in awful hives on her chest and arms.  Thought it was the due to using free and clear detergent with oxy.  The oxy was new.  She washed everything that she had used for it.  Was put on claritin  and triamcinolone cream.  Now going on two weeks of this.  The allergy med and cream have helped, but still coming and going and worse when stays in bed in the mornings.    Admits she's very anxious with all the changes and trying to please everyone at [facility]    She's trying to draw her line, but not doing everything for everyone.  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  Review of Systems:  Review of Systems  Constitutional: Negative for fever and chills.  HENT: Negative for congestion.   Eyes: Negative for blurred vision.  Respiratory: Negative for shortness of breath.   Cardiovascular: Negative for chest pain and leg swelling.  Gastrointestinal: Negative for abdominal pain, constipation, blood in stool and melena.  Genitourinary: Negative for dysuria.  Musculoskeletal: Positive for back pain and joint pain. Negative for falls.  Skin: Positive for itching and rash.       Right third digit finger nail thickening s/p trauma  Neurological: Negative for dizziness and loss of consciousness.  Psychiatric/Behavioral: Negative for memory loss. The patient is nervous/anxious. The patient does not have insomnia.     Past Medical History  Diagnosis Date  . Lyme borreliosis   .  Arthritis     RIGHT LEG  . Cancer     FACE/NOSE -SKIN  . Reflux esophagitis   . Anxiety state, unspecified   . Allergic rhinitis due to pollen   . Other atopic dermatitis and related conditions   . Lumbago   . Disorder of bone and cartilage, unspecified   . Other dysphagia   . Senile osteoporosis     Past Surgical History  Procedure Laterality Date  . Hernia repair Bilateral   . Appendectomy  1950    Allergies  Allergen Reactions  . Augmentin [Amoxicillin-Pot Clavulanate]   . Brandy [Alcohol]   . Omnicef [Cefdinir]   . Alprazolam Rash  . Ceftin [Cefuroxime Axetil] Rash  . Doxycycline Rash  . Eryc [Erythromycin] Rash  . Penicillins Rash   Medications: Patient's  Medications  New Prescriptions   No medications on file  Previous Medications   CHOLECALCIFEROL (VITAMIN D) 1000 UNITS TABLET    Take 1,000 Units by mouth daily.   LORATADINE (CLARITIN) 10 MG TABLET    Take 10 mg by mouth daily.   TRIAMCINOLONE CREAM (KENALOG) 0.1 %    Apply topically 2 (two) times daily.  Modified Medications   No medications on file  Discontinued Medications   SULFAMETHOXAZOLE-TRIMETHOPRIM (BACTRIM DS) 800-160 MG PER TABLET    Take 1 tablet by mouth 2 (two) times daily.   VAGIFEM 10 MCG TABS VAGINAL TABLET    INSERT 1 TABLET VAGINALLY TWICE A WEEK.    Physical Exam: Filed Vitals:   10/16/14 1139  BP: 158/78  Pulse: 87  Temp: 98.3 F (36.8 C)  TempSrc: Oral  Resp: 20  Height: _0  (1.626 m)  Weight: 131 lb 3.2 oz (59.512 kg)  SpO2: 97%   Physical Exam  Constitutional: She is oriented to person, place, and time. She appears well-developed and well-nourished. No distress.  Cardiovascular: Normal rate, regular rhythm, normal heart sounds and intact distal pulses.   Pulmonary/Chest: Effort normal and breath sounds normal. No respiratory distress.  Abdominal: Soft. Bowel sounds are normal.  Musculoskeletal: Normal range of motion.  Walks w/o assistive device  Neurological: She is alert and oriented to person, place, and time.  Skin:  Large hives on midchest and beneath breasts still present; has right third digit with thickening of distal 2/3 of nail, growing out but has not fallen off yet  Psychiatric:  Anxious, spoke quickly the entire visit    Labs reviewed: Basic Metabolic Panel:  Recent Labs  10/09/14 0812  NA 141  K 4.3  CL 102  CO2 24  GLUCOSE 80  BUN 12  CREATININE 0.68  CALCIUM 10.0   Liver Function Tests:  Recent Labs  10/09/14 0812  AST 22  ALT 21  ALKPHOS 81  BILITOT 0.3  PROT 6.5   No results for input(s): LIPASE, AMYLASE in the last 8760 hours. No results for input(s): AMMONIA in the last 8760 hours. CBC:  Recent  Labs  10/09/14 0812  WBC 6.4  NEUTROABS 3.6  HCT 39.9   Lipid Panel:  Recent Labs  04/02/14 0811 10/09/14 0812  CHOL 229* 233*  HDL 95 84  LDLCALC 106* 125*  TRIG 138 121  CHOLHDL 2.4 2.8   Assessment/Plan 1. Hives -she initially attributed these to the oxy that was added to her detergent, but they have not dissipated despite washing all of her clothes and linens several times -began just after she returned from a trip -cannot make any food correlations either -advised to discuss  with her dermatologist  2. Right-sided low back pain with right-sided sciatica -improved lately--does best is she maintains a consistent exercise program  3. Crushing injury of finger of right hand, initial encounter -advised to also mention this to her dermatologist to see if they recommend removing her nail  4. Adjustment disorder with mixed anxiety and depressed mood -refused medications, is seeing her spiritual advisor and I recommended she also see a therapist  Next appt:  6 mos and prn if her anxiety worsens or she develops a new problem  Bartlett Enke L. Chryl Holten, D.O. [facility] [address],  [address] Cell Phone (Mon-Fri 8am-5pm):  [phone] On Call:  [phone] & follow prompts after 5pm & weekends Office Phone:  [phone] Office Fax:  [phone]

## 2014-10-16 NOTE — Patient Instructions (Signed)
Let's check your blood pressure weekly with Wynell Balloon for a month.  Ask her to send me the results.  Goal is less than 150/90.

## 2014-10-23 DIAGNOSIS — Z1231 Encounter for screening mammogram for malignant neoplasm of breast: Secondary | ICD-10-CM | POA: Diagnosis not present

## 2014-10-27 ENCOUNTER — Encounter: Payer: Self-pay | Admitting: Women's Health

## 2014-12-10 ENCOUNTER — Telehealth: Payer: Self-pay | Admitting: *Deleted

## 2014-12-10 NOTE — Telephone Encounter (Signed)
Ok.  Order written on Rx.

## 2014-12-10 NOTE — Telephone Encounter (Signed)
Patient called and stated that she would like to continue her PT and wants an order faxed to the nurse at [facility], [place].Fax#: [phone]. Patient stated she thinks she needs to continue with therapy due to the pain in back and leg. Please Advise.

## 2014-12-11 NOTE — Telephone Encounter (Signed)
Rx Order faxed to Brady at [facility].

## 2014-12-14 DIAGNOSIS — M25551 Pain in right hip: Secondary | ICD-10-CM | POA: Diagnosis not present

## 2014-12-14 DIAGNOSIS — M79651 Pain in right thigh: Secondary | ICD-10-CM | POA: Diagnosis not present

## 2014-12-14 DIAGNOSIS — M25511 Pain in right shoulder: Secondary | ICD-10-CM | POA: Diagnosis not present

## 2014-12-14 DIAGNOSIS — M545 Low back pain: Secondary | ICD-10-CM | POA: Diagnosis not present

## 2014-12-16 DIAGNOSIS — M545 Low back pain: Secondary | ICD-10-CM | POA: Diagnosis not present

## 2014-12-16 DIAGNOSIS — M25511 Pain in right shoulder: Secondary | ICD-10-CM | POA: Diagnosis not present

## 2014-12-16 DIAGNOSIS — M25551 Pain in right hip: Secondary | ICD-10-CM | POA: Diagnosis not present

## 2014-12-16 DIAGNOSIS — M79651 Pain in right thigh: Secondary | ICD-10-CM | POA: Diagnosis not present

## 2014-12-17 ENCOUNTER — Telehealth: Payer: Self-pay | Admitting: *Deleted

## 2014-12-17 NOTE — Telephone Encounter (Signed)
Patient notified and stated that she will take Tylenol. She is not interested in Tramadol or Hydrocodone.

## 2014-12-17 NOTE — Telephone Encounter (Signed)
The only pain medications that do not potentially lead to stomach bleeding are tylenol and stronger meds like tramadol and hydrocodone.  Is she interested in taking something like that?

## 2014-12-17 NOTE — Telephone Encounter (Signed)
Patient called and stated that she has started PT at [facility] and requesting a antiinflammatory medication that won't cause stomach bleeding. Please Advise.

## 2014-12-18 DIAGNOSIS — M25511 Pain in right shoulder: Secondary | ICD-10-CM | POA: Diagnosis not present

## 2014-12-18 DIAGNOSIS — M79651 Pain in right thigh: Secondary | ICD-10-CM | POA: Diagnosis not present

## 2014-12-18 DIAGNOSIS — M545 Low back pain: Secondary | ICD-10-CM | POA: Diagnosis not present

## 2014-12-18 DIAGNOSIS — M25551 Pain in right hip: Secondary | ICD-10-CM | POA: Diagnosis not present

## 2014-12-21 DIAGNOSIS — M25551 Pain in right hip: Secondary | ICD-10-CM | POA: Diagnosis not present

## 2014-12-21 DIAGNOSIS — M79651 Pain in right thigh: Secondary | ICD-10-CM | POA: Diagnosis not present

## 2014-12-21 DIAGNOSIS — M25511 Pain in right shoulder: Secondary | ICD-10-CM | POA: Diagnosis not present

## 2014-12-21 DIAGNOSIS — M545 Low back pain: Secondary | ICD-10-CM | POA: Diagnosis not present

## 2014-12-24 DIAGNOSIS — M545 Low back pain: Secondary | ICD-10-CM | POA: Diagnosis not present

## 2014-12-24 DIAGNOSIS — M79651 Pain in right thigh: Secondary | ICD-10-CM | POA: Diagnosis not present

## 2014-12-24 DIAGNOSIS — M25551 Pain in right hip: Secondary | ICD-10-CM | POA: Diagnosis not present

## 2014-12-24 DIAGNOSIS — M25511 Pain in right shoulder: Secondary | ICD-10-CM | POA: Diagnosis not present

## 2014-12-27 ENCOUNTER — Ambulatory Visit (INDEPENDENT_AMBULATORY_CARE_PROVIDER_SITE_OTHER): Payer: Medicare Other | Admitting: Family Medicine

## 2014-12-27 VITALS — BP 130/82 | HR 95 | Temp 98.2°F | Resp 20 | Ht 64.5 in | Wt 133.0 lb

## 2014-12-27 DIAGNOSIS — L03011 Cellulitis of right finger: Secondary | ICD-10-CM

## 2014-12-27 DIAGNOSIS — T63461A Toxic effect of venom of wasps, accidental (unintentional), initial encounter: Secondary | ICD-10-CM

## 2014-12-27 MED ORDER — SULFAMETHOXAZOLE-TRIMETHOPRIM 800-160 MG PO TABS: 1.0000 | ORAL_TABLET | Freq: Two times a day (BID) | ORAL | Status: DC

## 2014-12-27 MED ORDER — PREDNISONE 20 MG PO TABS: ORAL_TABLET | ORAL | Status: DC

## 2014-12-27 NOTE — Progress Notes (Signed)
This chart was scribed for Robyn Haber, MD by [facility], medical scribe at [facility].The patient was seen in exam room 05 and the patient's care was started at 9:32 AM.  Patient ID: Rachel Morris MRN: [id number], DOB: 07/16/1930, 81 y.o. Date of Encounter: 12/27/2014  Primary Physician: Hollace Kinnier, DO  Chief Complaint:  Chief Complaint  Patient presents with   Insect Bite    wasp sting to her right hand yesterday.  increased swelling to her right hand and going into her wrist this morning.  denies any SOB   HPI:  Rachel Morris is a 81 y.o. female who presents to [facility] complaining of a wasp sting on her right hand which occurred yesterday morning. Used Cortisone cream and several home remedies. More swollen today and the redness has spread up her right arm. No shortness of breath. She Lives at [facility].  Past Medical History  Diagnosis Date   Lyme borreliosis    Arthritis     RIGHT LEG   Cancer     FACE/NOSE -SKIN   Reflux esophagitis    Anxiety state, unspecified    Allergic rhinitis due to pollen    Other atopic dermatitis and related conditions    Lumbago    Disorder of bone and cartilage, unspecified    Other dysphagia    Senile osteoporosis     Home Meds: Prior to Admission medications   Medication Sig Start Date End Date Taking? Authorizing Provider  cholecalciferol (VITAMIN D) 1000 UNITS tablet Take 1,000 Units by mouth daily.   Yes Historical Provider, MD  triamcinolone cream (KENALOG) 0.1 % Apply topically 2 (two) times daily. 10/07/14  Yes Historical Provider, MD  loratadine (CLARITIN) 10 MG tablet Take 10 mg by mouth daily.    Historical Provider, MD   Allergies:  Allergies  Allergen Reactions   Augmentin [Amoxicillin-Pot Clavulanate]    Brandy [Alcohol]    Omnicef [Cefdinir]    Alprazolam Rash   Ceftin [Cefuroxime Axetil] Rash   Doxycycline Rash   Eryc  [Erythromycin] Rash   Penicillins Rash   Social History   Social History   Marital Status: Widowed    Spouse Name: N/A   Number of Children: N/A   Years of Education: N/A   Occupational History   Not on file.   Social History Main Topics   Smoking status: Never Smoker    Smokeless tobacco: Not on file   Alcohol Use: 0.0 oz/week    0 Standard drinks or equivalent per week     Comment: WITH DINNER    Drug Use: No   Sexual Activity: Not on file   Other Topics Concern   Not on file   Social History Narrative    Review of Systems: Constitutional: negative for chills, fever, night sweats, weight changes, or fatigue  HEENT: negative for vision changes, hearing loss, congestion, rhinorrhea, ST, epistaxis, or sinus pressure Cardiovascular: negative for chest pain or palpitations Respiratory: negative for hemoptysis, wheezing, shortness of breath, or cough Abdominal: negative for abdominal pain, nausea, vomiting, diarrhea, or constipation Dermatological: negative for rash Neurologic: negative for headache, dizziness, or syncope All other systems reviewed and are otherwise negative with the exception to those above and in the HPI.  Physical Exam: Blood pressure 130/82, pulse 95, temperature 98.2 F (36.8 C), temperature source Oral, resp. rate 20, height 5' 4.5" (1.638 m), weight 133 lb (60.328 kg), SpO2 99 %., Body mass  index is 22.48 kg/(m^2). General: Well developed, well nourished, in no acute distress. Head: Normocephalic, atraumatic, eyes without discharge, sclera non-icteric, nares are without discharge. Bilateral auditory canals clear, TM's are without perforation, pearly grey and translucent with reflective cone of light bilaterally. Oral cavity moist, posterior pharynx without exudate, erythema, peritonsillar abscess, or post nasal drip.  Neck: Supple. No thyromegaly. Full ROM. No lymphadenopathy. Lungs: Clear bilaterally to auscultation without wheezes, rales,  or rhonchi. Breathing is unlabored. Heart: RRR with S1 S2. No murmurs, rubs, or gallops appreciated. Abdomen: Soft, non-tender, non-distended with normoactive bowel sounds. No hepatomegaly. No rebound/guarding. No obvious abdominal masses. Msk:  Strength and tone normal for age. Extremities/Skin: Warm and dry. No clubbing or cyanosis. Right arm is red and swollen with the redness traveling up her arm at the volar surface. Neuro: Alert and oriented X 3. Moves all extremities spontaneously. Gait is normal. CNII-XII grossly in tact. Psych:  Responds to questions appropriately with a normal affect.     ASSESSMENT AND PLAN:  81 y.o. year old female with     This chart was scribed in my presence and reviewed by me personally.    ICD-9-CM ICD-10-CM   1. Wasp sting, accidental or unintentional, initial encounter 989.5 T63.461A predniSONE (DELTASONE) 20 MG tablet   E905.3    2. Cellulitis of finger of right hand 681.00 L03.011 sulfamethoxazole-trimethoprim (BACTRIM DS,SEPTRA DS) 800-160 MG per tablet     Signed, Robyn Haber, MD   1. Wasp sting, accidental or unintentional, initial encounter   2. Cellulitis of finger of right hand     Signed, Robyn Haber, MD 12/27/2014 9:40 AM

## 2014-12-27 NOTE — Patient Instructions (Signed)

## 2014-12-28 DIAGNOSIS — M545 Low back pain: Secondary | ICD-10-CM | POA: Diagnosis not present

## 2014-12-28 DIAGNOSIS — M25511 Pain in right shoulder: Secondary | ICD-10-CM | POA: Diagnosis not present

## 2014-12-28 DIAGNOSIS — M25551 Pain in right hip: Secondary | ICD-10-CM | POA: Diagnosis not present

## 2014-12-28 DIAGNOSIS — M79651 Pain in right thigh: Secondary | ICD-10-CM | POA: Diagnosis not present

## 2014-12-30 ENCOUNTER — Telehealth: Payer: Self-pay

## 2014-12-30 ENCOUNTER — Telehealth: Payer: Self-pay | Admitting: *Deleted

## 2014-12-30 NOTE — Telephone Encounter (Signed)
Pt called asking if Prednisone can make her jittery after taking it? She states the medicine worked but now a little shaky. Is this normal? Please advise.

## 2014-12-30 NOTE — Telephone Encounter (Signed)
Yes, she is probably jittery now because of the build up in her system. This should go away soon since she is not taking anymore. If still feeling jittery in a 24-48 hours, let us know.

## 2014-12-30 NOTE — Telephone Encounter (Signed)
Patient called and stated that she was placed on Prednisone for 2 days due to a wasp sting allergic reaction. She went off of it and shaking. I told her that since the [facility] saw her for this and placed her on the medication she needed to give their office a call and let them know what was going on. She agreed and wanted the number. I looked it up and gave her the number.

## 2014-12-30 NOTE — Telephone Encounter (Signed)
Spoke with pt, advised message from Nicole. Pt understood. 

## 2014-12-30 NOTE — Telephone Encounter (Signed)
Im sorry Rachel Morris, she said she is done with the medication.

## 2014-12-30 NOTE — Telephone Encounter (Signed)
Yes, jitteriness is a common side effect from prednisone. She should make sure she takes in the morning so it wears off by bedtime.

## 2014-12-31 DIAGNOSIS — M25511 Pain in right shoulder: Secondary | ICD-10-CM | POA: Diagnosis not present

## 2014-12-31 DIAGNOSIS — M79651 Pain in right thigh: Secondary | ICD-10-CM | POA: Diagnosis not present

## 2014-12-31 DIAGNOSIS — M545 Low back pain: Secondary | ICD-10-CM | POA: Diagnosis not present

## 2014-12-31 DIAGNOSIS — M25551 Pain in right hip: Secondary | ICD-10-CM | POA: Diagnosis not present

## 2015-01-04 DIAGNOSIS — M25511 Pain in right shoulder: Secondary | ICD-10-CM | POA: Diagnosis not present

## 2015-01-04 DIAGNOSIS — M25551 Pain in right hip: Secondary | ICD-10-CM | POA: Diagnosis not present

## 2015-01-04 DIAGNOSIS — M79651 Pain in right thigh: Secondary | ICD-10-CM | POA: Diagnosis not present

## 2015-01-04 DIAGNOSIS — M545 Low back pain: Secondary | ICD-10-CM | POA: Diagnosis not present

## 2015-01-08 DIAGNOSIS — M25551 Pain in right hip: Secondary | ICD-10-CM | POA: Diagnosis not present

## 2015-01-08 DIAGNOSIS — M25511 Pain in right shoulder: Secondary | ICD-10-CM | POA: Diagnosis not present

## 2015-01-08 DIAGNOSIS — M79651 Pain in right thigh: Secondary | ICD-10-CM | POA: Diagnosis not present

## 2015-01-08 DIAGNOSIS — M545 Low back pain: Secondary | ICD-10-CM | POA: Diagnosis not present

## 2015-01-11 DIAGNOSIS — M25551 Pain in right hip: Secondary | ICD-10-CM | POA: Diagnosis not present

## 2015-01-11 DIAGNOSIS — M25511 Pain in right shoulder: Secondary | ICD-10-CM | POA: Diagnosis not present

## 2015-01-11 DIAGNOSIS — M79651 Pain in right thigh: Secondary | ICD-10-CM | POA: Diagnosis not present

## 2015-01-11 DIAGNOSIS — M545 Low back pain: Secondary | ICD-10-CM | POA: Diagnosis not present

## 2015-01-14 DIAGNOSIS — M545 Low back pain: Secondary | ICD-10-CM | POA: Diagnosis not present

## 2015-01-14 DIAGNOSIS — M25551 Pain in right hip: Secondary | ICD-10-CM | POA: Diagnosis not present

## 2015-01-14 DIAGNOSIS — M79651 Pain in right thigh: Secondary | ICD-10-CM | POA: Diagnosis not present

## 2015-01-14 DIAGNOSIS — M25511 Pain in right shoulder: Secondary | ICD-10-CM | POA: Diagnosis not present

## 2015-01-18 DIAGNOSIS — M79651 Pain in right thigh: Secondary | ICD-10-CM | POA: Diagnosis not present

## 2015-01-18 DIAGNOSIS — M25511 Pain in right shoulder: Secondary | ICD-10-CM | POA: Diagnosis not present

## 2015-01-18 DIAGNOSIS — M545 Low back pain: Secondary | ICD-10-CM | POA: Diagnosis not present

## 2015-01-18 DIAGNOSIS — M25551 Pain in right hip: Secondary | ICD-10-CM | POA: Diagnosis not present

## 2015-01-22 DIAGNOSIS — M25551 Pain in right hip: Secondary | ICD-10-CM | POA: Diagnosis not present

## 2015-01-22 DIAGNOSIS — M545 Low back pain: Secondary | ICD-10-CM | POA: Diagnosis not present

## 2015-01-22 DIAGNOSIS — M79651 Pain in right thigh: Secondary | ICD-10-CM | POA: Diagnosis not present

## 2015-01-22 DIAGNOSIS — M25511 Pain in right shoulder: Secondary | ICD-10-CM | POA: Diagnosis not present

## 2015-01-26 DIAGNOSIS — M25551 Pain in right hip: Secondary | ICD-10-CM | POA: Diagnosis not present

## 2015-01-26 DIAGNOSIS — M545 Low back pain: Secondary | ICD-10-CM | POA: Diagnosis not present

## 2015-01-26 DIAGNOSIS — M79651 Pain in right thigh: Secondary | ICD-10-CM | POA: Diagnosis not present

## 2015-01-26 DIAGNOSIS — M25511 Pain in right shoulder: Secondary | ICD-10-CM | POA: Diagnosis not present

## 2015-01-28 DIAGNOSIS — Z23 Encounter for immunization: Secondary | ICD-10-CM | POA: Diagnosis not present

## 2015-01-29 DIAGNOSIS — M545 Low back pain: Secondary | ICD-10-CM | POA: Diagnosis not present

## 2015-01-29 DIAGNOSIS — M79651 Pain in right thigh: Secondary | ICD-10-CM | POA: Diagnosis not present

## 2015-01-29 DIAGNOSIS — M25511 Pain in right shoulder: Secondary | ICD-10-CM | POA: Diagnosis not present

## 2015-01-29 DIAGNOSIS — M25551 Pain in right hip: Secondary | ICD-10-CM | POA: Diagnosis not present

## 2015-02-24 DIAGNOSIS — D485 Neoplasm of uncertain behavior of skin: Secondary | ICD-10-CM | POA: Diagnosis not present

## 2015-02-24 DIAGNOSIS — L309 Dermatitis, unspecified: Secondary | ICD-10-CM | POA: Diagnosis not present

## 2015-02-24 DIAGNOSIS — L570 Actinic keratosis: Secondary | ICD-10-CM | POA: Diagnosis not present

## 2015-04-16 ENCOUNTER — Ambulatory Visit (INDEPENDENT_AMBULATORY_CARE_PROVIDER_SITE_OTHER): Payer: Medicare Other | Admitting: Internal Medicine

## 2015-04-16 ENCOUNTER — Encounter: Payer: Self-pay | Admitting: Internal Medicine

## 2015-04-16 VITALS — BP 102/68 | HR 72 | Temp 98.3°F | Ht 65.0 in | Wt 132.6 lb

## 2015-04-16 DIAGNOSIS — L309 Dermatitis, unspecified: Secondary | ICD-10-CM

## 2015-04-16 DIAGNOSIS — Z13220 Encounter for screening for lipoid disorders: Secondary | ICD-10-CM

## 2015-04-16 DIAGNOSIS — F4323 Adjustment disorder with mixed anxiety and depressed mood: Secondary | ICD-10-CM

## 2015-04-16 DIAGNOSIS — Z23 Encounter for immunization: Secondary | ICD-10-CM | POA: Diagnosis not present

## 2015-04-16 DIAGNOSIS — M810 Age-related osteoporosis without current pathological fracture: Secondary | ICD-10-CM

## 2015-04-16 DIAGNOSIS — M5441 Lumbago with sciatica, right side: Secondary | ICD-10-CM

## 2015-04-16 MED ORDER — TETANUS-DIPHTH-ACELL PERTUSSIS 5-2.5-18.5 LF-MCG/0.5 IM SUSP
0.5000 mL | Freq: Once | INTRAMUSCULAR | Status: DC
Start: 2015-04-16 — End: 2015-07-06

## 2015-04-16 NOTE — Progress Notes (Signed)
Patient ID: Rachel Morris, female   DOB: 1931/02/10, 81 y.o.   MRN: [id number]   Location: [facility] Provider: Gwenith Spitz. Renato Gails, D.O., C.M.D.  Code Status: DNR Goals of Care: Advanced Directive information Does patient have an advance directive?: Yes, Type of Advance Directive: Healthcare Power of [place];Living will, Does patient want to make changes to advanced directive?: No - Patient declined  Chief Complaint  Patient presents with  . Medical Management of Chronic Issues    6 month follow-up     HPI: Patient is a 81 y.o. female seen in the office today for med mgt of chronic diseases.  She lives in [place] at [facility].   Had been very anxious living at [facility] after moving from a home.   Remains wobblier than she used to be.  No falls. 1.  "Right hip pain" persists--had horse fall on that leg years back--continues to get therapy through [facility].  Gets a lot of pain when weather changes.  Uses TENS machine.  She had overdone it when she got there--overdid it with tai chi and exercise.  Is keeping her weight.   2.  Allergy issues:  Has a lot of itching.  Sees dermatology in January.  Tries not to talk about it.  Has claritin.  Used once two weeks ago.  Changed her shampoo to cetaphil baby wash and feels like had helped scalp--improved--had eczema patches around her hairline.    Took prednisone only when stung by a wasp.  Did not tolerate it well.  Says she'd rather have pain rather than to deal with the feelings from the medication.   Had a UTI and was on abx for that at one point also.  Eating well.  Hasn't ever slept normally, but has scotch or vodka at bedtime which helps her get to sleep.  Trying to keep that limited to one.  Listens to books on tape and goes to bed early.  5 people near her have moved to [place].    Review of Systems:  Review of Systems  Constitutional: Negative for fever, chills and malaise/fatigue.  HENT: Negative for hearing loss.   Eyes: Negative for  blurred vision.  Respiratory: Negative for shortness of breath.   Cardiovascular: Negative for chest pain and leg swelling.  Gastrointestinal: Negative for abdominal pain.  Genitourinary: Negative for dysuria.  Musculoskeletal: Positive for joint pain. Negative for falls.  Neurological: Negative for dizziness, loss of consciousness and weakness.       Wobbly  Psychiatric/Behavioral: Positive for depression. Negative for memory loss. The patient is nervous/anxious.     Past Medical History  Diagnosis Date  . Lyme borreliosis   . Arthritis     RIGHT LEG  . Cancer (HCC)     FACE/NOSE -SKIN  . Reflux esophagitis   . Anxiety state, unspecified   . Allergic rhinitis due to pollen   . Other atopic dermatitis and related conditions   . Lumbago   . Disorder of bone and cartilage, unspecified   . Other dysphagia   . Senile osteoporosis     Past Surgical History  Procedure Laterality Date  . Hernia repair Bilateral   . Appendectomy  1950    Allergies  Allergen Reactions  . Augmentin [Amoxicillin-Pot Clavulanate]   . Brandy [Alcohol]   . Omnicef [Cefdinir]   . Alprazolam Rash  . Ceftin [Cefuroxime Axetil] Rash  . Doxycycline Rash  . Eryc [Erythromycin] Rash  . Penicillins Rash      Medication  List       This list is accurate as of: 04/16/15 11:18 AM.  Always use your most recent med list.               cholecalciferol 1000 UNITS tablet  Commonly known as:  VITAMIN D  Take 1,000 Units by mouth daily.     Garlic 381 MG Tabs  Take by mouth daily.     loratadine 10 MG tablet  Commonly known as:  CLARITIN  Take 10 mg by mouth as needed.     Tdap 5-2.5-18.5 LF-MCG/0.5 injection  Commonly known as:  BOOSTRIX  Inject 0.5 mLs into the muscle once.     triamcinolone cream 0.1 %  Commonly known as:  KENALOG  Apply topically 2 (two) times daily as needed.        Health Maintenance  Topic Date Due  . TETANUS/TDAP  05/01/2013  . INFLUENZA VACCINE  11/30/2014  .  DEXA SCAN  Completed  . ZOSTAVAX  Completed  . PNA vac Low Risk Adult  Completed    Physical Exam: Filed Vitals:   04/16/15 1102  BP: 102/68  Pulse: 72  Temp: 98.3 F (36.8 C)  TempSrc: Oral  Height: '5\' 5"'$  (1.651 m)  Weight: 132 lb 9.6 oz (60.147 kg)   Body mass index is 22.07 kg/(m^2). Physical Exam  Constitutional: She is oriented to person, place, and time.  Cardiovascular: Normal rate, regular rhythm, normal heart sounds and intact distal pulses.   Pulmonary/Chest: Effort normal and breath sounds normal. No respiratory distress.  Abdominal: Bowel sounds are normal.  Musculoskeletal: Normal range of motion. She exhibits tenderness.  Right hip down lateral aspect IT band region  Neurological: She is alert and oriented to person, place, and time.    Labs reviewed: Basic Metabolic Panel:  Recent Labs  10/09/14 0812  NA 141  K 4.3  CL 102  CO2 24  GLUCOSE 80  BUN 12  CREATININE 0.68  CALCIUM 10.0   Liver Function Tests:  Recent Labs  10/09/14 0812  AST 22  ALT 21  ALKPHOS 81  BILITOT 0.3  PROT 6.5  ALBUMIN 4.1   No results for input(s): LIPASE, AMYLASE in the last 8760 hours. No results for input(s): AMMONIA in the last 8760 hours. CBC:  Recent Labs  10/09/14 0812  WBC 6.4  NEUTROABS 3.6  HCT 39.9   Lipid Panel:  Recent Labs  10/09/14 0812  CHOL 233*  HDL 84  LDLCALC 125*  TRIG 121  CHOLHDL 2.8   No results found for: HGBA1C  Procedures since last visit: mammo 10/27/14  Assessment/Plan 1. Right-sided low back pain with right-sided sciatica -cont TENS and PT at [facility] -also gradually resume other exercise programs, but don't overdo it  2. Adjustment disorder with mixed anxiety and depressed mood -seems she is gradually adjusting to her new environment -she is now coping with losses in terms of deaths and people moving to [place] or [place]F from [place]  3. Senile osteoporosis -cont vitamin D daily and weightbearing exercise, healthy balanced  diet -only was on prednisone for short course  4. Eczema of face -showed me some dry areas and low dose steroid cream recommended for them  5. Need for Tdap vaccination - Rx provided to get at pharmacy  6. Screening, lipid -FLP recheck next visit if her insurance allows--LDL had increased and HDL decreased last time, but w/o cardiac or diabetes history, still at goal of <130  Labs/tests ordered:  No orders  of the defined types were placed in this encounter.   Next appt:  6 mos for annual exam with fasting labs day of visit  Clark. Charli Halle, D.O. [facility] [address], [place] [address] Cell Phone (Mon-Fri 8am-5pm):  [phone] On Call:  [phone] & follow prompts after 5pm & weekends Office Phone:  [phone] Office Fax:  [phone]

## 2015-04-16 NOTE — Patient Instructions (Signed)
Bring copy of Advance Directives- Health Care Power of Attorney and/or Living Will to next appointment.   

## 2015-05-04 ENCOUNTER — Other Ambulatory Visit: Payer: Self-pay | Admitting: Internal Medicine

## 2015-05-05 ENCOUNTER — Other Ambulatory Visit: Payer: Self-pay | Admitting: *Deleted

## 2015-05-05 MED ORDER — EPINEPHRINE 0.3 MG/0.3ML IJ SOAJ: 0.3000 mg | Freq: Once | INTRAMUSCULAR | Status: DC

## 2015-05-05 NOTE — Telephone Encounter (Signed)
[facility]  

## 2015-06-08 DIAGNOSIS — H40053 Ocular hypertension, bilateral: Secondary | ICD-10-CM | POA: Diagnosis not present

## 2015-06-08 DIAGNOSIS — Z961 Presence of intraocular lens: Secondary | ICD-10-CM | POA: Diagnosis not present

## 2015-06-11 DIAGNOSIS — Z85828 Personal history of other malignant neoplasm of skin: Secondary | ICD-10-CM | POA: Diagnosis not present

## 2015-06-11 DIAGNOSIS — D224 Melanocytic nevi of scalp and neck: Secondary | ICD-10-CM | POA: Diagnosis not present

## 2015-06-11 DIAGNOSIS — L309 Dermatitis, unspecified: Secondary | ICD-10-CM | POA: Diagnosis not present

## 2015-06-11 DIAGNOSIS — D2272 Melanocytic nevi of left lower limb, including hip: Secondary | ICD-10-CM | POA: Diagnosis not present

## 2015-06-11 DIAGNOSIS — Z23 Encounter for immunization: Secondary | ICD-10-CM | POA: Diagnosis not present

## 2015-06-11 DIAGNOSIS — L821 Other seborrheic keratosis: Secondary | ICD-10-CM | POA: Diagnosis not present

## 2015-07-06 ENCOUNTER — Ambulatory Visit (INDEPENDENT_AMBULATORY_CARE_PROVIDER_SITE_OTHER): Payer: Medicare Other | Admitting: Family Medicine

## 2015-07-06 VITALS — BP 148/68 | HR 83 | Temp 98.4°F | Resp 16 | Ht 63.5 in | Wt 130.0 lb

## 2015-07-06 DIAGNOSIS — H9201 Otalgia, right ear: Secondary | ICD-10-CM | POA: Diagnosis not present

## 2015-07-06 NOTE — Patient Instructions (Signed)
Try a little bit of your triamcinolone cream twice a day for 5-7 days  Gentle range of motion for your neck and heat as needed

## 2015-07-06 NOTE — Progress Notes (Signed)
   Subjective:    Patient ID: Rachel Morris, female    DOB: 05/04/1930, 82 y.o.   MRN: [id number]  HPI This is a pleasant 82 yo female who presents today with right sided ear canal pain. This is chronic and intermittent in nature. She was seen in the past by Dr. Erik Obey and prescribed Fluocinolone topical solution 0.05% for presumed eczema. She reports that she had good clearing of symptoms and has been maintained on jojoaba oil without difficulty for a couple of years. She began to have a little discomfort 3 days ago that got progressively worse. She used some of her fluocinolone solution this morning (it expired 2014) with some initial burning, but then she had some improvement.   Past Medical History  Diagnosis Date  . Lyme borreliosis   . Arthritis     RIGHT LEG  . Cancer (HCC)     FACE/NOSE -SKIN  . Reflux esophagitis   . Anxiety state, unspecified   . Allergic rhinitis due to pollen   . Other atopic dermatitis and related conditions   . Lumbago   . Disorder of bone and cartilage, unspecified   . Other dysphagia   . Senile osteoporosis    Past Surgical History  Procedure Laterality Date  . Hernia repair Bilateral   . Appendectomy  1950   History reviewed. No pertinent family history. Social History  Substance Use Topics  . Smoking status: Never Smoker   . Smokeless tobacco: None  . Alcohol Use: 0.0 oz/week    0 Standard drinks or equivalent per week     Comment: Bedtime      Review of Systems No fever, had some chills this morning in the waiting room.     Objective:   Physical Exam  Constitutional: She is oriented to person, place, and time. She appears well-developed and well-nourished. No distress.  Appears younger than stated age.   HENT:  Head: Normocephalic and atraumatic.  Right Ear: Tympanic membrane and external ear normal.  Left Ear: Tympanic membrane, external ear and ear canal normal.  Nose: Nose normal.  Mouth/Throat: Oropharynx is clear and  moist.  Right canal with slight erythema and some flaking.   Neck: Normal range of motion. Neck supple.  Cardiovascular: Normal rate.   Pulmonary/Chest: Effort normal.  Musculoskeletal: Normal range of motion.  Lymphadenopathy:    She has no cervical adenopathy.  Neurological: She is alert and oriented to person, place, and time.  Skin: Skin is warm and dry. She is not diaphoretic.  Psychiatric: She has a normal mood and affect. Her behavior is normal. Judgment and thought content normal.  Vitals reviewed. BP 148/68 mmHg  Pulse 83  Temp(Src) 98.4 F (36.9 C)  Resp 16  Ht 5' 3.5" (1.613 m)  Wt 130 lb (58.968 kg)  BMI 22.66 kg/m2  SpO2 96%     Assessment & Plan:  1. Right ear pain - looks like recurrence of eczema in her canal. I suspect th pain from fluocinolone was possibly due to the preparation being so old.  - she has some triamcinolone cream at home and was instructed to use a small amount in her ear twice a day for maximum 7 days - RTC precautions reviewed   Clarene Reamer, FNP-BC  [facility]  07/09/2015 4:44 PM

## 2015-07-07 DIAGNOSIS — H40053 Ocular hypertension, bilateral: Secondary | ICD-10-CM | POA: Diagnosis not present

## 2015-08-08 ENCOUNTER — Emergency Department (HOSPITAL_BASED_OUTPATIENT_CLINIC_OR_DEPARTMENT_OTHER): Payer: Medicare Other

## 2015-08-08 ENCOUNTER — Emergency Department (HOSPITAL_BASED_OUTPATIENT_CLINIC_OR_DEPARTMENT_OTHER)
Admission: EM | Admit: 2015-08-08 | Discharge: 2015-08-08 | Disposition: A | Discharge status: Home / Self Care | Payer: Medicare Other | Attending: Emergency Medicine | Admitting: Emergency Medicine

## 2015-08-08 ENCOUNTER — Encounter (HOSPITAL_BASED_OUTPATIENT_CLINIC_OR_DEPARTMENT_OTHER): Payer: Self-pay | Admitting: Emergency Medicine

## 2015-08-08 DIAGNOSIS — Z8619 Personal history of other infectious and parasitic diseases: Secondary | ICD-10-CM | POA: Diagnosis not present

## 2015-08-08 DIAGNOSIS — R42 Dizziness and giddiness: Secondary | ICD-10-CM | POA: Diagnosis not present

## 2015-08-08 DIAGNOSIS — Z8659 Personal history of other mental and behavioral disorders: Secondary | ICD-10-CM | POA: Insufficient documentation

## 2015-08-08 DIAGNOSIS — Z8709 Personal history of other diseases of the respiratory system: Secondary | ICD-10-CM | POA: Diagnosis not present

## 2015-08-08 DIAGNOSIS — H838X9 Other specified diseases of inner ear, unspecified ear: Secondary | ICD-10-CM | POA: Diagnosis not present

## 2015-08-08 DIAGNOSIS — M1990 Unspecified osteoarthritis, unspecified site: Secondary | ICD-10-CM | POA: Diagnosis not present

## 2015-08-08 DIAGNOSIS — Z85828 Personal history of other malignant neoplasm of skin: Secondary | ICD-10-CM | POA: Insufficient documentation

## 2015-08-08 DIAGNOSIS — Z8719 Personal history of other diseases of the digestive system: Secondary | ICD-10-CM | POA: Insufficient documentation

## 2015-08-08 DIAGNOSIS — R220 Localized swelling, mass and lump, head: Secondary | ICD-10-CM | POA: Insufficient documentation

## 2015-08-08 DIAGNOSIS — Z880 Allergy status to penicillin: Secondary | ICD-10-CM | POA: Insufficient documentation

## 2015-08-08 DIAGNOSIS — Z79899 Other long term (current) drug therapy: Secondary | ICD-10-CM | POA: Insufficient documentation

## 2015-08-08 DIAGNOSIS — H938X1 Other specified disorders of right ear: Secondary | ICD-10-CM | POA: Diagnosis not present

## 2015-08-08 DIAGNOSIS — R51 Headache: Secondary | ICD-10-CM

## 2015-08-08 DIAGNOSIS — R519 Headache, unspecified: Secondary | ICD-10-CM

## 2015-08-08 DIAGNOSIS — Z872 Personal history of diseases of the skin and subcutaneous tissue: Secondary | ICD-10-CM | POA: Diagnosis not present

## 2015-08-08 LAB — URINALYSIS, ROUTINE W REFLEX MICROSCOPIC
Bilirubin Urine: NEGATIVE
Glucose, UA: NEGATIVE mg/dL
Hgb urine dipstick: NEGATIVE
Ketones, ur: NEGATIVE mg/dL
Nitrite: NEGATIVE
Protein, ur: NEGATIVE mg/dL
Specific Gravity, Urine: 1.019 (ref 1.005–1.030)
pH: 6 (ref 5.0–8.0)

## 2015-08-08 LAB — CBC WITH DIFFERENTIAL/PLATELET
Basophils Absolute: 0.2 10*3/uL — ABNORMAL HIGH (ref 0.0–0.1)
Basophils Relative: 2 %
Eosinophils Absolute: 0.3 10*3/uL (ref 0.0–0.7)
Eosinophils Relative: 3 %
HCT: 41.6 % (ref 36.0–46.0)
Hemoglobin: 13.6 g/dL (ref 12.0–15.0)
Lymphocytes Relative: 29 %
Lymphs Abs: 2.7 10*3/uL (ref 0.7–4.0)
MCH: 31.6 pg (ref 26.0–34.0)
MCHC: 32.7 g/dL (ref 30.0–36.0)
MCV: 96.7 fL (ref 78.0–100.0)
Monocytes Absolute: 1 10*3/uL (ref 0.1–1.0)
Monocytes Relative: 10 %
Neutro Abs: 5.3 10*3/uL (ref 1.7–7.7)
Neutrophils Relative %: 56 %
Platelets: 259 10*3/uL (ref 150–400)
RBC: 4.3 MIL/uL (ref 3.87–5.11)
RDW: 13.9 % (ref 11.5–15.5)
WBC: 9.5 10*3/uL (ref 4.0–10.5)

## 2015-08-08 LAB — BASIC METABOLIC PANEL
Anion gap: 6 (ref 5–15)
BUN: 21 mg/dL — ABNORMAL HIGH (ref 6–20)
CO2: 27 mmol/L (ref 22–32)
Calcium: 10 mg/dL (ref 8.9–10.3)
Chloride: 105 mmol/L (ref 101–111)
Creatinine, Ser: 0.76 mg/dL (ref 0.44–1.00)
GFR calc Af Amer: >60 mL/min (ref >60)
GFR calc non Af Amer: >60 mL/min (ref >60)
Glucose, Bld: 98 mg/dL (ref 65–99)
Potassium: 4.2 mmol/L (ref 3.5–5.1)
Sodium: 138 mmol/L (ref 135–145)

## 2015-08-08 LAB — URINE MICROSCOPIC-ADD ON: RBC / HPF: NONE SEEN RBC/hpf (ref 0–5)

## 2015-08-08 LAB — SEDIMENTATION RATE: Sed Rate: 33 mm/hr — ABNORMAL HIGH (ref 0–22)

## 2015-08-08 MED ORDER — PREDNISONE 50 MG PO TABS: 50.0000 mg | ORAL_TABLET | Freq: Every day | ORAL | Status: DC

## 2015-08-08 MED ORDER — NITROFURANTOIN MONOHYD MACRO 100 MG PO CAPS: 100.0000 mg | ORAL_CAPSULE | Freq: Two times a day (BID) | ORAL | Status: DC

## 2015-08-08 MED ORDER — TRAMADOL HCL 50 MG PO TABS: 50.0000 mg | ORAL_TABLET | Freq: Four times a day (QID) | ORAL | Status: DC | PRN

## 2015-08-08 NOTE — ED Notes (Signed)
Pt with fullness to right ear and pain, chronic condition that she has had for many years, seen at [facility] and given antibiotics, several weeks ago, pt very talkative nad, moving around in triage without diffiuclty

## 2015-08-08 NOTE — Discharge Instructions (Signed)
Return here as needed.  Follow-up with your primary doctor along with your ENT. the testing here today did not show any significant abnormality

## 2015-08-08 NOTE — ED Provider Notes (Signed)
CSN: [id number]     Arrival date & time 08/08/15  1236 History   First MD Initiated Contact with Patient 08/08/15 1315     Chief Complaint  Patient presents with  . Ear Fullness  . Dizziness     (Consider location/radiation/quality/duration/timing/severity/associated sxs/prior Treatment) HPI Patient presents to the emergency department withEar fullness that has been a chronic problem but seems to be worse over the last couple weeks.  The patient states that she was seen at [facility] urgent care one month ago and given steroid cream to use in her ear for eczema of her ear canal.  The patient states that the fullness seems to worsen and she states that she had a recent appointment with an ENT doctor but canceled it because she felt she was getting better.  Patient states that she does feel somewhat off balance a little bit dizzy but not room spinning.  The patient states that the concerning part for her is that seems worse than previous.  She is also having facial pain in the area just in front of her ear. The patient denies chest pain, shortness of breath, headache,blurred vision, neck pain, fever, cough, weakness, numbness, anorexia, edema, abdominal pain, nausea, vomiting, diarrhea, rash, back pain, dysuria, hematemesis, bloody stool, near syncope, or syncope. Past Medical History  Diagnosis Date  . Lyme borreliosis   . Arthritis     RIGHT LEG  . Cancer (HCC)     FACE/NOSE -SKIN  . Reflux esophagitis   . Anxiety state, unspecified   . Allergic rhinitis due to pollen   . Other atopic dermatitis and related conditions   . Lumbago   . Disorder of bone and cartilage, unspecified   . Other dysphagia   . Senile osteoporosis    Past Surgical History  Procedure Laterality Date  . Hernia repair Bilateral   . Appendectomy  1950   History reviewed. No pertinent family history. Social History  Substance Use Topics  . Smoking status: Never Smoker   . Smokeless tobacco: None  . Alcohol Use: 0.0  oz/week    0 Standard drinks or equivalent per week     Comment: Bedtime    OB History    Gravida Para Term Preterm AB TAB SAB Ectopic Multiple Living   2 2        2      Review of Systems All other systems negative except as documented in the HPI. All pertinent positives and negatives as reviewed in the HPI.   Allergies  Augmentin; Brandy; Omnicef; Alprazolam; Ceftin; Doxycycline; Eryc; and Penicillins  Home Medications   Prior to Admission medications   Medication Sig Start Date End Date Taking? Authorizing Provider  cholecalciferol (VITAMIN D) 1000 UNITS tablet Take 1,000 Units by mouth daily.   Yes Historical Provider, MD  EPINEPHrine (EPIPEN 2-PAK) 0.3 mg/0.3 mL IJ SOAJ injection Inject 0.3 mLs (0.3 mg total) into the muscle once. As needed for allergic reaction 05/05/15  Yes Tiffany L Reed, DO  Garlic 123XX123 MG TABS Take by mouth daily.   Yes Historical Provider, MD  triamcinolone cream (KENALOG) 0.1 % Apply topically 2 (two) times daily as needed. Reported on 07/06/2015 10/07/14  Yes Historical Provider, MD   BP 154/84 mmHg  Pulse 90  Resp 19  Ht 5\' 4"  (1.626 m)  Wt 58.968 kg  BMI 22.30 kg/m2  SpO2 100% Physical Exam  Constitutional: She is oriented to person, place, and time. She appears well-developed and well-nourished. No distress.  HENT:  Head: Normocephalic and atraumatic.    Mouth/Throat: Oropharynx is clear and moist.  Eyes: Pupils are equal, round, and reactive to light.  Neck: Normal range of motion. Neck supple.  Cardiovascular: Normal rate, regular rhythm and normal heart sounds.  Exam reveals no gallop and no friction rub.   No murmur heard. Pulmonary/Chest: Effort normal and breath sounds normal. No respiratory distress. She has no wheezes.  Abdominal: Soft. Bowel sounds are normal. She exhibits no distension. There is no tenderness.  Neurological: She is alert and oriented to person, place, and time. She exhibits normal muscle tone. Coordination normal.  The  patient has normal heel to shin and finger to nose testing and gait is not abnormal  Skin: Skin is warm and dry. No rash noted. No erythema.  Psychiatric: She has a normal mood and affect. Her behavior is normal.  Nursing note and vitals reviewed.   ED Course  Procedures (including critical care time) Labs Review Labs Reviewed  BASIC METABOLIC PANEL - Abnormal; Notable for the following:    BUN 21 (*)    All other components within normal limits  CBC WITH DIFFERENTIAL/PLATELET - Abnormal; Notable for the following:    Basophils Absolute 0.2 (*)    All other components within normal limits  URINALYSIS, ROUTINE W REFLEX MICROSCOPIC (NOT AT [facility]) - Abnormal; Notable for the following:    Leukocytes, UA LARGE (*)    All other components within normal limits  URINE MICROSCOPIC-ADD ON - Abnormal; Notable for the following:    Squamous Epithelial / LPF 0-5 (*)    Bacteria, UA MANY (*)    All other components within normal limits  URINE CULTURE  SEDIMENTATION RATE    Imaging Review Ct Head Wo Contrast  08/08/2015  CLINICAL DATA:  Pt complains of right ear pain today with some dizziness, pt states this has happened before, feels like it is sinus related, denies headache, denies dizziness EXAM: CT HEAD WITHOUT CONTRAST TECHNIQUE: Contiguous axial images were obtained from the base of the skull through the vertex without intravenous contrast. COMPARISON:  None. FINDINGS: The ventricles are normal configuration. There is age related ventricular and sulcal enlargement. There are no parenchymal masses or mass effect. There is no evidence a cortical infarct. Mild periventricular white matter hypoattenuation is noted consistent chronic microvascular ischemic change. There are no extra-axial masses or abnormal fluid collections. There is no intracranial hemorrhage. Visualized sinuses and mastoid air cells are clear. IMPRESSION: 1. No acute intracranial abnormalities. 2. Age related volume loss. Mild  chronic microvascular ischemic change. Electronically Signed   By: Lajean Manes M.D.   On: 08/08/2015 15:04   I have personally reviewed and evaluated these images and lab results as part of my medical decision-making.   EKG Interpretation   Date/Time:  Sunday August 08 2015 14:23:23 EDT Ventricular Rate:  88 PR Interval:  153 QRS Duration: 82 QT Interval:  342 QTC Calculation: 414 R Axis:   40 Text Interpretation:  Sinus rhythm Probable left atrial enlargement  Anterior infarct, old No significant change was found \\E \ Confirmed by  Wyvonnia Dusky  MD, [place] [phone]) on 08/08/2015 3:55:34 PM      MDM   Final diagnoses:  None     The patient has a CT scan does not show any acute findings.  The patient is observed here in the emergency department.  She is able to ambulate without significant difficulties.  She does have normal fine motor function.  On examination it seems less likely  to be a posterior type circulation stroke, but I did advise her that this is a possibility and she would need to follow up with her primary care doctor.  The patient agrees to the plan and all questions were answered.  I do feel that there is a component of TMJ syndrome based on the fact that she has facial pain.  She states that her main concern at this point, the patient is a very healthy 82 year old and is very active and self-sufficient  Dalia Heading, PA-C 08/10/15 [place], PA-C 08/10/15 [id number]  Ezequiel Essex, MD 08/11/15 [phone]

## 2015-08-09 ENCOUNTER — Telehealth: Payer: Self-pay | Admitting: *Deleted

## 2015-08-09 MED ORDER — CIPROFLOXACIN HCL 250 MG PO TABS: 250.0000 mg | ORAL_TABLET | Freq: Two times a day (BID) | ORAL | Status: DC

## 2015-08-09 NOTE — Telephone Encounter (Signed)
Pt aware, Rx sent. 

## 2015-08-09 NOTE — Telephone Encounter (Signed)
Pt was treated at [facility] on Sunday for a UTI prescribed Macrobid 100 mg x 7 days #14. When picking Rx up from pharmacy pt said the pharmacist told her that her that the [place] showed up as an allergy listed. Pt never started medication, states she has never taken Macrobid before and nervous to take, we treated her before with UTI medication such as septra DS. I explained to pt that [facility] and if allergy was indicated [facility]. Pt would like you advice how to proceed. Please advise

## 2015-08-09 NOTE — Telephone Encounter (Signed)
Please call, she has many allergies and it may have been noted at her pharmacy and not in the [facility] system. Have her take Cipro 250 twice daily for 3 days.

## 2015-08-10 LAB — URINE CULTURE

## 2015-08-12 ENCOUNTER — Ambulatory Visit (INDEPENDENT_AMBULATORY_CARE_PROVIDER_SITE_OTHER): Payer: Medicare Other | Admitting: Women's Health

## 2015-08-12 ENCOUNTER — Encounter: Payer: Self-pay | Admitting: Women's Health

## 2015-08-12 VITALS — BP 118/80

## 2015-08-12 DIAGNOSIS — Z4689 Encounter for fitting and adjustment of other specified devices: Secondary | ICD-10-CM

## 2015-08-12 NOTE — Progress Notes (Signed)
Patient ID: Rachel Morris, female   DOB: 02-09-1931, 82 y.o.   MRN: [id number] Presents for pessary maintenance, states is having difficulty removing and replacing. Cystocele with good relief of leakage of urine with pessary. Denies other symptoms at this time, recently treated for UTI states is now feeling much better. Denies vaginal discharge, abdominal pain or fever. Widow, living at [facility] doing well.  Exam: Appears well. External genitalia mild erythema at introitus, pessary removed with moderate difficulty, speculum exam mild erythema noted no erosion.   Cystocele good relief with pessary Pessary maintenance  Plan: Will leave pessary out. Will order a smaller pessary currently a #5 will order a #4 ring pessary with support, return to office to have placed.  [name]

## 2015-08-30 ENCOUNTER — Telehealth: Payer: Self-pay | Admitting: *Deleted

## 2015-08-30 NOTE — Telephone Encounter (Signed)
Pt called to see if pessary had arrived, pt aware it has and will schedule insert appointment.

## 2015-08-31 ENCOUNTER — Ambulatory Visit (INDEPENDENT_AMBULATORY_CARE_PROVIDER_SITE_OTHER): Payer: Medicare Other | Admitting: Women's Health

## 2015-08-31 ENCOUNTER — Encounter: Payer: Self-pay | Admitting: Women's Health

## 2015-08-31 VITALS — BP 124/80 | Ht 63.0 in | Wt 130.0 lb

## 2015-08-31 DIAGNOSIS — M545 Low back pain: Secondary | ICD-10-CM | POA: Diagnosis not present

## 2015-08-31 DIAGNOSIS — R262 Difficulty in walking, not elsewhere classified: Secondary | ICD-10-CM | POA: Diagnosis not present

## 2015-08-31 DIAGNOSIS — Z4689 Encounter for fitting and adjustment of other specified devices: Secondary | ICD-10-CM

## 2015-08-31 DIAGNOSIS — M79651 Pain in right thigh: Secondary | ICD-10-CM | POA: Diagnosis not present

## 2015-08-31 DIAGNOSIS — N814 Uterovaginal prolapse, unspecified: Secondary | ICD-10-CM

## 2015-08-31 DIAGNOSIS — M25561 Pain in right knee: Secondary | ICD-10-CM | POA: Diagnosis not present

## 2015-08-31 DIAGNOSIS — M25551 Pain in right hip: Secondary | ICD-10-CM | POA: Diagnosis not present

## 2015-08-31 DIAGNOSIS — M25511 Pain in right shoulder: Secondary | ICD-10-CM | POA: Diagnosis not present

## 2015-08-31 NOTE — Progress Notes (Signed)
Patient ID: Rachel Morris, female   DOB: [birth year], 82 y.o.   MRN: [id number] Presents fo new pessary insertion. Was having difficulty removing and replacing #5 ring with support pessary, has had for about 10 years. First several days after pessary was removed no problems, and then started to feel more of a bulge vaginally, no urinary leakage noted.  Exam: Appears well. External genitalia with mild bearing-down pressure +2-3 cystocele, no leakage of urine noted. #4 Ring with support pessary placed with ease, able to urinate after insertion.   Pessary maintenance  Instructed to return if difficulty removing to  wash and replace.

## 2015-09-03 DIAGNOSIS — M25551 Pain in right hip: Secondary | ICD-10-CM | POA: Diagnosis not present

## 2015-09-03 DIAGNOSIS — M545 Low back pain: Secondary | ICD-10-CM | POA: Diagnosis not present

## 2015-09-03 DIAGNOSIS — M25511 Pain in right shoulder: Secondary | ICD-10-CM | POA: Diagnosis not present

## 2015-09-03 DIAGNOSIS — M79651 Pain in right thigh: Secondary | ICD-10-CM | POA: Diagnosis not present

## 2015-09-03 DIAGNOSIS — R262 Difficulty in walking, not elsewhere classified: Secondary | ICD-10-CM | POA: Diagnosis not present

## 2015-09-03 DIAGNOSIS — M25561 Pain in right knee: Secondary | ICD-10-CM | POA: Diagnosis not present

## 2015-09-07 DIAGNOSIS — M25511 Pain in right shoulder: Secondary | ICD-10-CM | POA: Diagnosis not present

## 2015-09-07 DIAGNOSIS — M545 Low back pain: Secondary | ICD-10-CM | POA: Diagnosis not present

## 2015-09-07 DIAGNOSIS — M79651 Pain in right thigh: Secondary | ICD-10-CM | POA: Diagnosis not present

## 2015-09-07 DIAGNOSIS — R262 Difficulty in walking, not elsewhere classified: Secondary | ICD-10-CM | POA: Diagnosis not present

## 2015-09-07 DIAGNOSIS — M25551 Pain in right hip: Secondary | ICD-10-CM | POA: Diagnosis not present

## 2015-09-07 DIAGNOSIS — M25561 Pain in right knee: Secondary | ICD-10-CM | POA: Diagnosis not present

## 2015-09-10 DIAGNOSIS — M545 Low back pain: Secondary | ICD-10-CM | POA: Diagnosis not present

## 2015-09-10 DIAGNOSIS — M25511 Pain in right shoulder: Secondary | ICD-10-CM | POA: Diagnosis not present

## 2015-09-10 DIAGNOSIS — R262 Difficulty in walking, not elsewhere classified: Secondary | ICD-10-CM | POA: Diagnosis not present

## 2015-09-10 DIAGNOSIS — M79651 Pain in right thigh: Secondary | ICD-10-CM | POA: Diagnosis not present

## 2015-09-10 DIAGNOSIS — M25551 Pain in right hip: Secondary | ICD-10-CM | POA: Diagnosis not present

## 2015-09-10 DIAGNOSIS — M25561 Pain in right knee: Secondary | ICD-10-CM | POA: Diagnosis not present

## 2015-09-14 DIAGNOSIS — M25551 Pain in right hip: Secondary | ICD-10-CM | POA: Diagnosis not present

## 2015-09-14 DIAGNOSIS — M25511 Pain in right shoulder: Secondary | ICD-10-CM | POA: Diagnosis not present

## 2015-09-14 DIAGNOSIS — M79651 Pain in right thigh: Secondary | ICD-10-CM | POA: Diagnosis not present

## 2015-09-14 DIAGNOSIS — M545 Low back pain: Secondary | ICD-10-CM | POA: Diagnosis not present

## 2015-09-14 DIAGNOSIS — R262 Difficulty in walking, not elsewhere classified: Secondary | ICD-10-CM | POA: Diagnosis not present

## 2015-09-14 DIAGNOSIS — M25561 Pain in right knee: Secondary | ICD-10-CM | POA: Diagnosis not present

## 2015-09-17 DIAGNOSIS — M545 Low back pain: Secondary | ICD-10-CM | POA: Diagnosis not present

## 2015-09-17 DIAGNOSIS — R262 Difficulty in walking, not elsewhere classified: Secondary | ICD-10-CM | POA: Diagnosis not present

## 2015-09-17 DIAGNOSIS — M25511 Pain in right shoulder: Secondary | ICD-10-CM | POA: Diagnosis not present

## 2015-09-17 DIAGNOSIS — M79651 Pain in right thigh: Secondary | ICD-10-CM | POA: Diagnosis not present

## 2015-09-17 DIAGNOSIS — M25561 Pain in right knee: Secondary | ICD-10-CM | POA: Diagnosis not present

## 2015-09-17 DIAGNOSIS — M25551 Pain in right hip: Secondary | ICD-10-CM | POA: Diagnosis not present

## 2015-09-20 DIAGNOSIS — M79651 Pain in right thigh: Secondary | ICD-10-CM | POA: Diagnosis not present

## 2015-09-20 DIAGNOSIS — M25511 Pain in right shoulder: Secondary | ICD-10-CM | POA: Diagnosis not present

## 2015-09-20 DIAGNOSIS — M25551 Pain in right hip: Secondary | ICD-10-CM | POA: Diagnosis not present

## 2015-09-20 DIAGNOSIS — M545 Low back pain: Secondary | ICD-10-CM | POA: Diagnosis not present

## 2015-09-20 DIAGNOSIS — M25561 Pain in right knee: Secondary | ICD-10-CM | POA: Diagnosis not present

## 2015-09-20 DIAGNOSIS — R262 Difficulty in walking, not elsewhere classified: Secondary | ICD-10-CM | POA: Diagnosis not present

## 2015-09-22 ENCOUNTER — Telehealth: Payer: Self-pay | Admitting: *Deleted

## 2015-09-22 DIAGNOSIS — M25551 Pain in right hip: Secondary | ICD-10-CM | POA: Diagnosis not present

## 2015-09-22 DIAGNOSIS — M79651 Pain in right thigh: Secondary | ICD-10-CM | POA: Diagnosis not present

## 2015-09-22 DIAGNOSIS — M545 Low back pain: Secondary | ICD-10-CM | POA: Diagnosis not present

## 2015-09-22 DIAGNOSIS — M25561 Pain in right knee: Secondary | ICD-10-CM | POA: Diagnosis not present

## 2015-09-22 DIAGNOSIS — M25511 Pain in right shoulder: Secondary | ICD-10-CM | POA: Diagnosis not present

## 2015-09-22 DIAGNOSIS — R262 Difficulty in walking, not elsewhere classified: Secondary | ICD-10-CM | POA: Diagnosis not present

## 2015-09-22 NOTE — Telephone Encounter (Signed)
Rachel Morris with [facility] called and stated that patient is having Right hip/extremity pain and wants to know if she can have an X-ray ordered to check this. Patient has an appointment with you on June 16 and would like to have the results to discuss at that time. Please Advise.

## 2015-09-22 NOTE — Telephone Encounter (Signed)
Xray order placed and patient and Lilia Pro notified.

## 2015-09-22 NOTE — Telephone Encounter (Signed)
xrays of lumbar spine, right hip and pelvis please

## 2015-09-23 ENCOUNTER — Ambulatory Visit
Admission: RE | Admit: 2015-09-23 | Discharge: 2015-09-23 | Disposition: A | Discharge status: Home / Self Care | Payer: Medicare Other | Source: Ambulatory Visit | Attending: Internal Medicine | Admitting: Internal Medicine

## 2015-09-23 DIAGNOSIS — M47816 Spondylosis without myelopathy or radiculopathy, lumbar region: Secondary | ICD-10-CM | POA: Diagnosis not present

## 2015-09-23 DIAGNOSIS — M25551 Pain in right hip: Secondary | ICD-10-CM

## 2015-09-23 DIAGNOSIS — M1611 Unilateral primary osteoarthritis, right hip: Secondary | ICD-10-CM | POA: Diagnosis not present

## 2015-09-24 DIAGNOSIS — M79651 Pain in right thigh: Secondary | ICD-10-CM | POA: Diagnosis not present

## 2015-09-24 DIAGNOSIS — R262 Difficulty in walking, not elsewhere classified: Secondary | ICD-10-CM | POA: Diagnosis not present

## 2015-09-24 DIAGNOSIS — M25511 Pain in right shoulder: Secondary | ICD-10-CM | POA: Diagnosis not present

## 2015-09-24 DIAGNOSIS — M545 Low back pain: Secondary | ICD-10-CM | POA: Diagnosis not present

## 2015-09-24 DIAGNOSIS — M25561 Pain in right knee: Secondary | ICD-10-CM | POA: Diagnosis not present

## 2015-09-24 DIAGNOSIS — M25551 Pain in right hip: Secondary | ICD-10-CM | POA: Diagnosis not present

## 2015-09-28 DIAGNOSIS — M25561 Pain in right knee: Secondary | ICD-10-CM | POA: Diagnosis not present

## 2015-09-28 DIAGNOSIS — M79651 Pain in right thigh: Secondary | ICD-10-CM | POA: Diagnosis not present

## 2015-09-28 DIAGNOSIS — M545 Low back pain: Secondary | ICD-10-CM | POA: Diagnosis not present

## 2015-09-28 DIAGNOSIS — M25551 Pain in right hip: Secondary | ICD-10-CM | POA: Diagnosis not present

## 2015-09-28 DIAGNOSIS — M25511 Pain in right shoulder: Secondary | ICD-10-CM | POA: Diagnosis not present

## 2015-09-28 DIAGNOSIS — R262 Difficulty in walking, not elsewhere classified: Secondary | ICD-10-CM | POA: Diagnosis not present

## 2015-10-01 DIAGNOSIS — M25551 Pain in right hip: Secondary | ICD-10-CM | POA: Diagnosis not present

## 2015-10-01 DIAGNOSIS — M79651 Pain in right thigh: Secondary | ICD-10-CM | POA: Diagnosis not present

## 2015-10-01 DIAGNOSIS — M545 Low back pain: Secondary | ICD-10-CM | POA: Diagnosis not present

## 2015-10-01 DIAGNOSIS — M25561 Pain in right knee: Secondary | ICD-10-CM | POA: Diagnosis not present

## 2015-10-01 DIAGNOSIS — R262 Difficulty in walking, not elsewhere classified: Secondary | ICD-10-CM | POA: Diagnosis not present

## 2015-10-01 DIAGNOSIS — M25511 Pain in right shoulder: Secondary | ICD-10-CM | POA: Diagnosis not present

## 2015-10-05 DIAGNOSIS — M79651 Pain in right thigh: Secondary | ICD-10-CM | POA: Diagnosis not present

## 2015-10-05 DIAGNOSIS — M25551 Pain in right hip: Secondary | ICD-10-CM | POA: Diagnosis not present

## 2015-10-05 DIAGNOSIS — M25511 Pain in right shoulder: Secondary | ICD-10-CM | POA: Diagnosis not present

## 2015-10-05 DIAGNOSIS — M545 Low back pain: Secondary | ICD-10-CM | POA: Diagnosis not present

## 2015-10-05 DIAGNOSIS — M25561 Pain in right knee: Secondary | ICD-10-CM | POA: Diagnosis not present

## 2015-10-05 DIAGNOSIS — R262 Difficulty in walking, not elsewhere classified: Secondary | ICD-10-CM | POA: Diagnosis not present

## 2015-10-08 DIAGNOSIS — M25551 Pain in right hip: Secondary | ICD-10-CM | POA: Diagnosis not present

## 2015-10-08 DIAGNOSIS — M79651 Pain in right thigh: Secondary | ICD-10-CM | POA: Diagnosis not present

## 2015-10-08 DIAGNOSIS — M25511 Pain in right shoulder: Secondary | ICD-10-CM | POA: Diagnosis not present

## 2015-10-08 DIAGNOSIS — R262 Difficulty in walking, not elsewhere classified: Secondary | ICD-10-CM | POA: Diagnosis not present

## 2015-10-08 DIAGNOSIS — M25561 Pain in right knee: Secondary | ICD-10-CM | POA: Diagnosis not present

## 2015-10-08 DIAGNOSIS — M545 Low back pain: Secondary | ICD-10-CM | POA: Diagnosis not present

## 2015-10-12 ENCOUNTER — Ambulatory Visit (INDEPENDENT_AMBULATORY_CARE_PROVIDER_SITE_OTHER): Payer: Medicare Other | Admitting: Women's Health

## 2015-10-12 ENCOUNTER — Encounter: Payer: Self-pay | Admitting: Women's Health

## 2015-10-12 VITALS — BP 126/80 | Ht 63.0 in | Wt 130.0 lb

## 2015-10-12 DIAGNOSIS — M8580 Other specified disorders of bone density and structure, unspecified site: Secondary | ICD-10-CM

## 2015-10-12 DIAGNOSIS — M25561 Pain in right knee: Secondary | ICD-10-CM | POA: Diagnosis not present

## 2015-10-12 DIAGNOSIS — Z01419 Encounter for gynecological examination (general) (routine) without abnormal findings: Secondary | ICD-10-CM | POA: Diagnosis not present

## 2015-10-12 DIAGNOSIS — R262 Difficulty in walking, not elsewhere classified: Secondary | ICD-10-CM | POA: Diagnosis not present

## 2015-10-12 DIAGNOSIS — M79651 Pain in right thigh: Secondary | ICD-10-CM | POA: Diagnosis not present

## 2015-10-12 DIAGNOSIS — M899 Disorder of bone, unspecified: Secondary | ICD-10-CM

## 2015-10-12 DIAGNOSIS — M545 Low back pain: Secondary | ICD-10-CM | POA: Diagnosis not present

## 2015-10-12 DIAGNOSIS — Z13820 Encounter for screening for osteoporosis: Secondary | ICD-10-CM

## 2015-10-12 DIAGNOSIS — M25551 Pain in right hip: Secondary | ICD-10-CM | POA: Diagnosis not present

## 2015-10-12 DIAGNOSIS — M25511 Pain in right shoulder: Secondary | ICD-10-CM | POA: Diagnosis not present

## 2015-10-12 NOTE — Progress Notes (Signed)
Rachel Morris March 20, 1977 [id number]    History:    Presents for breast and pelvic exam. Postmenopausal on no HRT with no bleeding. Normal Pap and mammogram history. 2009 negative colonoscopy. Using a ring with support pessary with good relief is able to remove and replace every 3 months. 09/2013 DEXA T score -1.9 at left femoral neck had shown some improvement. Struggling with right hip osteoarthritis is scheduled to see orthopedist. Current on vaccines.   Past medical history, past surgical history, family history and social history were all reviewed and documented in the EPIC chart. Lives at [facility]. Originally from [place], daughter lives in the area.  ROS:  A ROS was performed and pertinent positives and negatives are included.  Exam:  Filed Vitals:   10/12/15 0928  BP: 126/80    General appearance:  Normal Thyroid:  Symmetrical, normal in size, without palpable masses or nodularity. Respiratory  Auscultation:  Clear without wheezing or rhonchi Cardiovascular  Auscultation:  Regular rate, without rubs, murmurs or gallops  Edema/varicosities:  Not grossly evident Abdominal  Soft,nontender, without masses, guarding or rebound.  Liver/spleen:  No organomegaly noted  Hernia:  None appreciated  Skin  Inspection:  Grossly normal   Breasts: Examined lying and sitting.     Right: Without masses, retractions, discharge or axillary adenopathy.     Left: Without masses, retractions, discharge or axillary adenopathy. Gentitourinary   Inguinal/mons:  Normal without inguinal adenopathy  External genitalia:  Normal  BUS/Urethra/Skene's glands:  Normal  Vagina:  Normal  Cervix:  Normal  Uterus:  +1 cystocele, normal in size, shape and contour.  Midline and mobile  Adnexa/parametria:     Rt: Without masses or tenderness.   Lt: Without masses or tenderness.  Anus and perineum: Normal  Digital rectal exam: Normal sphincter tone without palpated masses or  tenderness  Assessment/Plan:  82 y.o. W WF G2 P2  for breast and pelvic exam.  Ring with support pessary with good relief of cystocele/no incontinence Postmenopausal/no HRT/no bleeding Osteopenia/stable with some improvement Right hip osteoarthritis has follow-up scheduled Labs-primary care  Plan: Repeat DEXA. Reviewed importance of weightbearing exercise, home safety, fall prevention reviewed. Vitamin D 1000 daily encouraged. Keep scheduled follow-up for osteoarthritis/possible hip replacement. Instructed to return to office of difficulty removing bleeding with support pessary. Pessary removed, washed, replaced without difficulty. UA, no Pap, new screening guidelines reviewed.      Huel Cote WHNP, 2:28 PM 10/12/2015

## 2015-10-12 NOTE — Patient Instructions (Addendum)
Menopause is a normal process in which your reproductive ability comes to an end. This process happens gradually over a span of months to years, usually between the ages of 48 and 55. Menopause is complete when you have missed 12 consecutive menstrual periods. It is important to talk with your health care provider about some of the most common conditions that affect postmenopausal women, such as heart disease, cancer, and bone loss (osteoporosis). Adopting a healthy lifestyle and getting preventive care can help to promote your health and wellness. Those actions can also lower your chances of developing some of these common conditions. WHAT SHOULD I KNOW ABOUT MENOPAUSE? During menopause, you may experience a number of symptoms, such as:  Moderate-to-severe hot flashes.  Night sweats.  Decrease in sex drive.  Mood swings.  Headaches.  Tiredness.  Irritability.  Memory problems.  Insomnia. Choosing to treat or not to treat menopausal changes is an individual decision that you make with your health care provider. WHAT SHOULD I KNOW ABOUT HORMONE REPLACEMENT THERAPY AND SUPPLEMENTS? Hormone therapy products are effective for treating symptoms that are associated with menopause, such as hot flashes and night sweats. Hormone replacement carries certain risks, especially as you become older. If you are thinking about using estrogen or estrogen with progestin treatments, discuss the benefits and risks with your health care provider. WHAT SHOULD I KNOW ABOUT HEART DISEASE AND STROKE? Heart disease, heart attack, and stroke become more likely as you age. This may be due, in part, to the hormonal changes that your body experiences during menopause. These can affect how your body processes dietary fats, triglycerides, and cholesterol. Heart attack and stroke are both medical emergencies. There are many things that you can do to help prevent heart disease and stroke:  Have your blood pressure  checked at least every 1-2 years. High blood pressure causes heart disease and increases the risk of stroke.  If you are 80-79 years old, ask your health care provider if you should take aspirin to prevent a heart attack or a stroke.  Do not use any tobacco products, including cigarettes, chewing tobacco, or electronic cigarettes. If you need help quitting, ask your health care provider.  It is important to eat a healthy diet and maintain a healthy weight.  Be sure to include plenty of vegetables, fruits, low-fat dairy products, and lean protein.  Avoid eating foods that are high in solid fats, added sugars, or salt (sodium).  Get regular exercise. This is one of the most important things that you can do for your health.  Try to exercise for at least 150 minutes each week. The type of exercise that you do should increase your heart rate and make you sweat. This is known as moderate-intensity exercise.  Try to do strengthening exercises at least twice each week. Do these in addition to the moderate-intensity exercise.  Know your numbers.Ask your health care provider to check your cholesterol and your blood glucose. Continue to have your blood tested as directed by your health care provider. WHAT SHOULD I KNOW ABOUT CANCER SCREENING? There are several types of cancer. Take the following steps to reduce your risk and to catch any cancer development as early as possible. Breast Cancer  Practice breast self-awareness.  This means understanding how your breasts normally appear and feel.  It also means doing regular breast self-exams. Let your health care provider know about any changes, no matter how small.  If you are 80 or older, have a clinician do a   breast exam (clinical breast exam or CBE) every year. Depending on your age, family history, and medical history, it may be recommended that you also have a yearly breast X-ray (mammogram).  If you have a family history of breast cancer,  talk with your health care provider about genetic screening.  If you are at high risk for breast cancer, talk with your health care provider about having an MRI and a mammogram every year.  Breast cancer (BRCA) gene test is recommended for women who have family members with BRCA-related cancers. Results of the assessment will determine the need for genetic counseling and BRCA1 and for BRCA2 testing. BRCA-related cancers include these types:  Breast. This occurs in males or females.  Ovarian.  Tubal. This may also be called fallopian tube cancer.  Cancer of the abdominal or pelvic lining (peritoneal cancer).  Prostate.  Pancreatic. Cervical, Uterine, and Ovarian Cancer Your health care provider may recommend that you be screened regularly for cancer of the pelvic organs. These include your ovaries, uterus, and vagina. This screening involves a pelvic exam, which includes checking for microscopic changes to the surface of your cervix (Pap test).  For women ages 80-65, health care providers may recommend a pelvic exam and a Pap test every three years. For women ages 80-65, they may recommend the Pap test and pelvic exam, combined with testing for human papilloma virus (HPV), every five years. Some types of HPV increase your risk of cervical cancer. Testing for HPV may also be done on women of any age who have unclear Pap test results.  Other health care providers may not recommend any screening for nonpregnant women who are considered low risk for pelvic cancer and have no symptoms. Ask your health care provider if a screening pelvic exam is right for you.  If you have had past treatment for cervical cancer or a condition that could lead to cancer, you need Pap tests and screening for cancer for at least 20 years after your treatment. If Pap tests have been discontinued for you, your risk factors (such as having a new sexual partner) need to be reassessed to determine if you should start having  screenings again. Some women have medical problems that increase the chance of getting cervical cancer. In these cases, your health care provider may recommend that you have screening and Pap tests more often.  If you have a family history of uterine cancer or ovarian cancer, talk with your health care provider about genetic screening.  If you have vaginal bleeding after reaching menopause, tell your health care provider.  There are currently no reliable tests available to screen for ovarian cancer. Lung Cancer Lung cancer screening is recommended for adults 3-70 years old who are at high risk for lung cancer because of a history of smoking. A yearly low-dose CT scan of the lungs is recommended if you:  Currently smoke.  Have a history of at least 30 pack-years of smoking and you currently smoke or have quit within the past 15 years. A pack-year is smoking an average of one pack of cigarettes per day for one year. Yearly screening should:  Continue until it has been 15 years since you quit.  Stop if you develop a health problem that would prevent you from having lung cancer treatment. Colorectal Cancer  This type of cancer can be detected and can often be prevented.  Routine colorectal cancer screening usually begins at age 38 and continues through age 12.  If you have  risk factors for colon cancer, your health care provider may recommend that you be screened at an earlier age.  If you have a family history of colorectal cancer, talk with your health care provider about genetic screening.  Your health care provider may also recommend using home test kits to check for hidden blood in your stool.  A small camera at the end of a tube can be used to examine your colon directly (sigmoidoscopy or colonoscopy). This is done to check for the earliest forms of colorectal cancer.  Direct examination of the colon should be repeated every 5-10 years until age 67. However, if early forms of  precancerous polyps or small growths are found or if you have a family history or genetic risk for colorectal cancer, you may need to be screened more often. Skin Cancer  Check your skin from head to toe regularly.  Monitor any moles. Be sure to tell your health care provider:  About any new moles or changes in moles, especially if there is a change in a mole's shape or color.  If you have a mole that is larger than the size of a pencil eraser.  If any of your family members has a history of skin cancer, especially at a young age, talk with your health care provider about genetic screening.  Always use sunscreen. Apply sunscreen liberally and repeatedly throughout the day.  Whenever you are outside, protect yourself by wearing long sleeves, pants, a wide-brimmed hat, and sunglasses. WHAT SHOULD I KNOW ABOUT OSTEOPOROSIS? Osteoporosis is a condition in which bone destruction happens more quickly than new bone creation. After menopause, you may be at an increased risk for osteoporosis. To help prevent osteoporosis or the bone fractures that can happen because of osteoporosis, the following is recommended:  If you are 39-61 years old, get at least 1,000 mg of calcium and at least 600 mg of vitamin D per day.  If you are older than age 16 but younger than age 7, get at least 1,200 mg of calcium and at least 600 mg of vitamin D per day.  If you are older than age 47, get at least 1,200 mg of calcium and at least 800 mg of vitamin D per day. Smoking and excessive alcohol intake increase the risk of osteoporosis. Eat foods that are rich in calcium and vitamin D, and do weight-bearing exercises several times each week as directed by your health care provider. WHAT SHOULD I KNOW ABOUT HOW MENOPAUSE AFFECTS [place]? Depression may occur at any age, but it is more common as you become older. Common symptoms of depression include:  Low or sad mood.  Changes in sleep patterns.  Changes  in appetite or eating patterns.  Feeling an overall lack of motivation or enjoyment of activities that you previously enjoyed.  Frequent crying spells. Talk with your health care provider if you think that you are experiencing depression. WHAT SHOULD I KNOW ABOUT IMMUNIZATIONS? It is important that you get and maintain your immunizations. These include:  Tetanus, diphtheria, and pertussis (Tdap) booster vaccine.  Influenza every year before the flu season begins.  Pneumonia vaccine.  Shingles vaccine. Your health care provider may also recommend other immunizations.   This information is not intended to replace advice given to you by your health care provider. Make sure you discuss any questions you have with your health care provider.   Document Released: 06/09/2005 Document Revised: 05/08/2014 Document Reviewed: 12/18/2013 Elsevier Interactive Patient Education 2016 Else[employer]  Inc[employer] Total Hip Replacement Total hip replacement is a surgical procedure to remove damaged bone in your hip joint and replace it with an artificial hip joint (prosthetic hip joint). The purpose of this surgery is to reduce pain and to improve your hip function.  During a total hip replacement, one or both parts of the hip joint are replaced, depending on the type of joint damage you have. The hip is a ball-and-socket type of joint, and it has two main parts. The ball part of the joint (femoral head) is the top of the thigh bone (femur). The socket part of the joint is a large indent in the side of your pelvis (acetabulum) where the femur and pelvis meet. LET [facility] CARE PROVIDER KNOW ABOUT:  Any allergies you have.  All medicines you are taking, including vitamins, herbs, eye drops, creams, and over-the-counter medicines.  Previous problems you or members of your family have had with the use of anesthetics.  Any blood disorders you have.  Previous surgeries you have had.  Medical conditions you  have. RISKS AND COMPLICATIONS  Generally, total hip replacement is a safe procedure. However, problems can occur, including:  Infection.  Dislocation (the ball of the hip-joint prosthesis comes out of contact with the socket).  Loosening of the piece (stem) that connects the prosthetic femoral head to the femur.  Fracture of the bone while inserting the prosthesis.  Formation of blood clots, which can break loose and travel to and injure your lungs (pulmonary embolus). BEFORE THE PROCEDURE   Plan to have someone take you home after the procedure.  Do not eat or drink anything after midnight on the night before the procedure or as directed by your health care provider.  Ask your health care provider about:  Changing or stopping your regular medicines. This is especially important if you are taking diabetes medicines or blood thinners.  Taking medicines such as aspirin and ibuprofen. These medicines can thin your blood. Do not take these medicines before your procedure if your health care provider asks you not to.  Ask your health care provider about how your surgical site will be marked or identified.  You may be given antibiotic medicines to help prevent infection. PROCEDURE   To reduce your risk of infection:  Your health care team will wash or sanitize their hands.  Your skin will be washed with soap.  An IV tube will be inserted into one of your veins. You will be given one or more of the following:  A medicine that makes you drowsy (sedative).  A medicine that makes you fall asleep (general anesthetic).  A medicine injected into your spine that numbs your body below the waist (spinal anesthetic).  An incision will be made in your hip. Your surgeon will take out any damaged cartilage and bone.  Your surgeon will then:  Insert a prosthetic socket into the acetabulum of your pelvis. This is usually secured with screws.  Remove the femoral head and replace it with a  prosthetic ball and stem secured into the top of your femur.  Place the ball into the socket and check the range of motion and stability of your new hip.  Close the incision and apply a bandage over the surgical site. AFTER THE PROCEDURE   You will stay in a recovery area until the medicines have worn off.  Your vital signs, such as your pulse and blood pressure, will be monitored.  Once you are awake and stable,  you will be taken to a hospital room.  You may be directed to take actions to help prevent blood clots. These may include:  Walking soon after surgery, with someone assisting you. Moving around after surgery helps to improve blood flow.  Taking medicines to thin your blood (anticoagulants).  Wearing compression stockings or using different types of devices.  You will receive physical therapy until you are doing well and your health care provider feels it is safe for you to go home.   This information is not intended to replace advice given to you by your health care provider. Make sure you discuss any questions you have with your health care provider.   Document Released: 07/24/2000 Document Revised: 01/06/2015 Document Reviewed: 06/18/2013 Elsevier Interactive Patient Education 2016 [employer] After Surgery Exercising your hip can greatly improve the results of your hip surgery. The exercises described here are designed to help you keep full movement of your hip joint. HOW SHOULD I EXERCISE MY HIP? The following exercises can be done on a training mat, on the floor, on a table, or on a bed. Use whatever works best and is most comfortable for you. Perform all exercises about fifteen times on each side, three times per day or as directed.  Lying on your back, slowly slide your foot toward your buttocks, raising your knee up off the floor. Then slowly slide your foot back down until your leg is straight again.  Lying on your back, spread your legs as  far apart as you can without feeling discomfort.  Lying on your side, raise your leg straight up from the floor as far as is comfortable. Slowly lower the leg.  Lying on your back, tighten up the muscle in the front of your thigh (quadriceps). You can do this by keeping your leg straight and trying to raise your heel off the floor. This helps strengthen the largest muscle supporting your knee.  Lying on your back, tighten up the muscles of your buttocks both with the legs straight and with the knee bent at a comfortable angle while keeping your heel on the floor.  Lying on your stomach, lift your toes off the floor toward your buttocks. Bend your knee as far as is comfortable. Tighten the muscles in your buttocks while doing this. Follow all safety measures that are given to protect your hip. If any of these exercises cause increased pain or swelling in your joint, decrease the exercises until you are comfortable again. Then slowly increase the exercises. Call your health care provider if you have problems or questions.    This information is not intended to replace advice given to you by your health care provider. Make sure you discuss any questions you have with your health care provider.   Document Released: 11/19/2003 Document Revised: 05/08/2014 Document Reviewed: 07/17/2013 Elsevier Interactive Patient Education 2016 Elsevier Inc. Osteoarthritis Osteoarthritis is a disease that causes soreness and inflammation of a joint. It occurs when the cartilage at the affected joint wears down. Cartilage acts as a cushion, covering the ends of bones where they meet to form a joint. Osteoarthritis is the most common form of arthritis. It often occurs in older people. The joints affected most often by this condition include those in the:  Ends of the fingers.  Thumbs.  Neck.  Lower back.  Knees.  Hips. CAUSES  Over time, the cartilage that covers the ends of bones begins to wear away. This  causes bone to rub  on bone, producing pain and stiffness in the affected joints.  RISK FACTORS Certain factors can increase your chances of having osteoarthritis, including:  Older age.  Excessive body weight.  Overuse of joints.  Previous joint injury. SIGNS AND SYMPTOMS   Pain, swelling, and stiffness in the joint.  Over time, the joint may lose its normal shape.  Small deposits of bone (osteophytes) may grow on the edges of the joint.  Bits of bone or cartilage can break off and float inside the joint space. This may cause more pain and damage. DIAGNOSIS  Your health care provider will do a physical exam and ask about your symptoms. Various tests may be ordered, such as:  X-rays of the affected joint.  Blood tests to rule out other types of arthritis. Additional tests may be used to diagnose your condition. TREATMENT  Goals of treatment are to control pain and improve joint function. Treatment plans may include:  A prescribed exercise program that allows for rest and joint relief.  A weight control plan.  Pain relief techniques, such as:  Properly applied heat and cold.  Electric pulses delivered to nerve endings under the skin (transcutaneous electrical nerve stimulation [TENS]).  Massage.  Certain nutritional supplements.  Medicines to control pain, such as:  Acetaminophen.  Nonsteroidal anti-inflammatory drugs (NSAIDs), such as naproxen.  Narcotic or central-acting agents, such as tramadol.  Corticosteroids. These can be given orally or as an injection.  Surgery to reposition the bones and relieve pain (osteotomy) or to remove loose pieces of bone and cartilage. Joint replacement may be needed in advanced states of osteoarthritis. HOME CARE INSTRUCTIONS   Take medicines only as directed by your health care provider.  Maintain a healthy weight. Follow your health care provider's instructions for weight control. This may include dietary  instructions.  Exercise as directed. Your health care provider can recommend specific types of exercise. These may include:  Strengthening exercises. These are done to strengthen the muscles that support joints affected by arthritis. They can be performed with weights or with exercise bands to add resistance.  Aerobic activities. These are exercises, such as brisk walking or low-impact aerobics, that get your heart pumping.  Range-of-motion activities. These keep your joints limber.  Balance and agility exercises. These help you maintain daily living skills.  Rest your affected joints as directed by your health care provider.  Keep all follow-up visits as directed by your health care provider. SEEK MEDICAL CARE IF:   Your skin turns red.  You develop a rash in addition to your joint pain.  You have worsening joint pain.  You have a fever along with joint or muscle aches. SEEK IMMEDIATE MEDICAL CARE IF:  You have a significant loss of weight or appetite.  You have night sweats. [place] of Arthritis and Musculoskeletal and Skin Diseases: www.niams.[employer].es  [employer] on Aging: http://kim-miller.com/  American College of Rheumatology: www.rheumatology.org   This information is not intended to replace advice given to you by your health care provider. Make sure you discuss any questions you have with your health care provider.   Document Released: 04/17/2005 Document Revised: 05/08/2014 Document Reviewed: 12/23/2012 Elsevier Interactive Patient Education [employer].

## 2015-10-15 ENCOUNTER — Ambulatory Visit (INDEPENDENT_AMBULATORY_CARE_PROVIDER_SITE_OTHER): Payer: Medicare Other | Admitting: Internal Medicine

## 2015-10-15 ENCOUNTER — Encounter: Payer: Self-pay | Admitting: Internal Medicine

## 2015-10-15 VITALS — BP 128/70 | HR 68 | Temp 98.2°F | Ht 63.0 in | Wt 129.0 lb

## 2015-10-15 DIAGNOSIS — F4323 Adjustment disorder with mixed anxiety and depressed mood: Secondary | ICD-10-CM | POA: Diagnosis not present

## 2015-10-15 DIAGNOSIS — Z0000 Encounter for general adult medical examination without abnormal findings: Secondary | ICD-10-CM | POA: Diagnosis not present

## 2015-10-15 DIAGNOSIS — M25551 Pain in right hip: Secondary | ICD-10-CM | POA: Diagnosis not present

## 2015-10-15 DIAGNOSIS — M25561 Pain in right knee: Secondary | ICD-10-CM | POA: Diagnosis not present

## 2015-10-15 DIAGNOSIS — M79651 Pain in right thigh: Secondary | ICD-10-CM | POA: Diagnosis not present

## 2015-10-15 DIAGNOSIS — E785 Hyperlipidemia, unspecified: Secondary | ICD-10-CM

## 2015-10-15 DIAGNOSIS — M1731 Unilateral post-traumatic osteoarthritis, right knee: Secondary | ICD-10-CM | POA: Diagnosis not present

## 2015-10-15 DIAGNOSIS — M25511 Pain in right shoulder: Secondary | ICD-10-CM | POA: Diagnosis not present

## 2015-10-15 DIAGNOSIS — R262 Difficulty in walking, not elsewhere classified: Secondary | ICD-10-CM | POA: Diagnosis not present

## 2015-10-15 DIAGNOSIS — M1611 Unilateral primary osteoarthritis, right hip: Secondary | ICD-10-CM | POA: Diagnosis not present

## 2015-10-15 DIAGNOSIS — M810 Age-related osteoporosis without current pathological fracture: Secondary | ICD-10-CM | POA: Diagnosis not present

## 2015-10-15 DIAGNOSIS — M545 Low back pain: Secondary | ICD-10-CM | POA: Diagnosis not present

## 2015-10-15 NOTE — Progress Notes (Signed)
Patient ID: Rachel Morris, female   DOB: November 08, 1930, 82 y.o.   MRN: [id number] Passed clock

## 2015-10-15 NOTE — Progress Notes (Signed)
Location:   [place]   Place of Service:   clinic Provider: Tamaiya Bump L. Mariea Clonts, D.O., C.M.D.  Patient Care Team: [name], DO as PCP - General (Geriatric Medicine)  Extended Emergency Contact Information Primary Emergency Contact: Augustin Schooling [address] [address] of [place] Mobile Phone: [phone] Relation: Daughter  Code Status: DNR Goals of Care: Advanced Directive information Advanced Directives 10/15/2015  Does patient have an advance directive? Yes  Type of Paramedic of [place];Out of facility DNR (pink MOST or yellow form)  Copy of advanced directive(s) in chart? Yes  Would patient like information on creating an advanced directive? -  Pre-existing out of facility DNR order (yellow form or pink MOST form) Yellow form placed in chart (order not valid for inpatient use)     Chief Complaint  Patient presents with  . Annual Exam    wellness exam  . MMSE    28/30 passed clock    HPI: Patient is a 82 y.o. female seen in today for an annual wellness exam.    Had her xrays and got a diagnosis.  Has severe arthritis in her right hip.  Has bone spurs and loss of superior joint space. Has some mild arthritis in her back and anterolisthesis.  Pain goes from her posterior hip region down the outside of her right leg.  Is getting PT for this and having some success.   Depression screen [facility] 2/9 10/15/2015 07/06/2015 12/27/2014 10/16/2014 04/06/2014  Decreased Interest 0 0 1 0 0  Down, Depressed, Hopeless 0 0 0 0 0  PHQ - 2 Score 0 0 1 0 0    Fall Risk  10/15/2015 04/16/2015 10/16/2014 04/06/2014 09/30/2012  Falls in the past year? No No No Yes No  Number falls in past yr: - - - 1 -  Injury with Fall? - - - Yes -   MMSE - Mini Mental State Exam 10/15/2015  Orientation to time 5  Orientation to Place 4  Registration 3  Attention/ Calculation 5  Recall 2  Language- name 2 objects 2  Language- repeat 1  Language- follow 3 step command  3  Language- read & follow direction 1  Write a sentence 1  Copy design 1  Total score 28     Health Maintenance  Topic Date Due  . MAMMOGRAM  10/27/2015  . INFLUENZA VACCINE  11/30/2015  . TETANUS/TDAP  05/03/2025  . DEXA SCAN  Completed  . ZOSTAVAX  Completed  . PNA vac Low Risk Adult  Completed    Urinary incontinence? Rare drip when she first gets up.  Has her pessary.  Just got a smaller one with her gyn.  Dr Fernandez/Nancy NP  Functional Status Survey: Is the patient deaf or have difficulty hearing?: No Does the patient have difficulty seeing, even when wearing glasses/contacts?: No Does the patient have difficulty concentrating, remembering, or making decisions?: No Does the patient have difficulty walking or climbing stairs?: Yes Does the patient have difficulty dressing or bathing?: No Does the patient have difficulty doing errands alone such as visiting a doctor's office or shopping?: No Exercise?Current Exercise Habits: Home exercise routine (walking in the pool, physical therapy), Type of exercise: Other - see comments, Time (Minutes): 30, Frequency (Times/Week): 2, Weekly Exercise (Minutes/Week): 60, Intensity: Mild Exercise limited by: orthopedic condition(s) Diet?  Has limited diet due to her intolerances Vision Screening Comments: Dr. Katy Fitch Jan 2017  Seeing well Hearing:  No problems Dentition:  No problems Pain:  6/10 in hip when she can barely walk home form the dining rm--in groin, hip, SI and down leg into ankle  Has lost 4 lbs with her diet/exercise. Past Medical History  Diagnosis Date  . Lyme borreliosis   . Arthritis     RIGHT LEG  . Cancer (HCC)     FACE/NOSE -SKIN  . Reflux esophagitis   . Anxiety state, unspecified   . Allergic rhinitis due to pollen   . Other atopic dermatitis and related conditions   . Lumbago   . Disorder of bone and cartilage, unspecified   . Other dysphagia   . Senile osteoporosis     Past Surgical History    Procedure Laterality Date  . Hernia repair Bilateral   . Appendectomy  1950    Social History   Social History  . Marital Status: Widowed    Spouse Name: N/A  . Number of Children: N/A  . Years of Education: N/A   Occupational History  . Not on file.   Social History Main Topics  . Smoking status: Never Smoker   . Smokeless tobacco: Never Used  . Alcohol Use: 0.0 oz/week    0 Standard drinks or equivalent per week     Comment: Bedtime   . Drug Use: No  . Sexual Activity: Not on file   Other Topics Concern  . Not on file   Social History Narrative    Allergies  Allergen Reactions  . Augmentin [Amoxicillin-Pot Clavulanate]   . Brandy [Alcohol]   . Clindamycin Other (See Comments)    unkown  . Omnicef [Cefdinir]   . Prune     PLUMS,GRAPES,RAISINS  . Alprazolam Rash  . Ceftin [Cefuroxime Axetil] Rash  . Doxycycline Rash  . Eryc [Erythromycin] Rash  . Penicillins Rash      Medication List       This list is accurate as of: 10/15/15  9:25 AM.  Always use your most recent med list.               cholecalciferol 1000 units tablet  Commonly known as:  VITAMIN D  Take 1,000 Units by mouth daily.     EPINEPHrine 0.3 mg/0.3 mL Soaj injection  Commonly known as:  EPIPEN 2-PAK  Inject 0.3 mLs (0.3 mg total) into the muscle once. As needed for allergic reaction     Garlic 123XX123 MG Tabs  Take by mouth daily.        Review of Systems:  Review of Systems  Constitutional: Negative for fever, chills and malaise/fatigue.  HENT: Negative for congestion and hearing loss.   Eyes: Negative for blurred vision.       Glasses  Respiratory: Negative for cough and shortness of breath.   Cardiovascular: Negative for chest pain and leg swelling.  Gastrointestinal: Negative for abdominal pain, constipation, blood in stool and melena.  Genitourinary: Positive for urgency. Negative for dysuria and frequency.  Musculoskeletal: Positive for myalgias, back pain and joint  pain. Negative for falls.  Skin: Negative for rash.  Neurological: Negative for dizziness, loss of consciousness and weakness.  Psychiatric/Behavioral: Negative for depression and memory loss. The patient is not nervous/anxious and does not have insomnia.     Physical Exam: Filed Vitals:   10/15/15 0849  BP: 128/70  Pulse: 68  Temp: 98.2 F (36.8 C)  TempSrc: Oral  Height: 5\' 3"  (1.6 m)  Weight: 129 lb (58.514 kg)  SpO2: 98%  Body mass index is 22.86 kg/(m^2). Physical Exam  Constitutional: She is oriented to person, place, and time. She appears well-developed and well-nourished. No distress.  HENT:  Head: Normocephalic and atraumatic.  Right Ear: External ear normal.  Left Ear: External ear normal.  Nose: Nose normal.  Mouth/Throat: Oropharynx is clear and moist. No oropharyngeal exudate.  Eyes: Conjunctivae and EOM are normal. Pupils are equal, round, and reactive to light.  glasses  Neck: Normal range of motion. Neck supple. No JVD present. No tracheal deviation present. No thyromegaly present.  Cardiovascular: Normal rate, regular rhythm, normal heart sounds and intact distal pulses.   Pulmonary/Chest: Effort normal and breath sounds normal. No respiratory distress.  Breast exam done by gyn NP  Abdominal: Soft. Bowel sounds are normal. She exhibits no distension and no mass. There is no tenderness.  Musculoskeletal: Normal range of motion. She exhibits tenderness. She exhibits no edema.  Right groin, right lower sacroiliac area, pain increased with right hip external rotation, no increase in pain with straight leg raise  Lymphadenopathy:    She has no cervical adenopathy.  Neurological: She is alert and oriented to person, place, and time. She has normal reflexes.  Skin: Skin is warm and dry.  Psychiatric: She has a normal mood and affect.    Labs reviewed: Basic Metabolic Panel:  Recent Labs  08/08/15 1440  NA 138  K 4.2  CL 105  CO2 27  GLUCOSE 98  BUN 21*   CREATININE 0.76  CALCIUM 10.0   CBC:  Recent Labs  08/08/15 1440  WBC 9.5  NEUTROABS 5.3  HGB 13.6  HCT 41.6  MCV 96.7  PLT 259    Procedures: Dg Lumbar Spine Complete  09/23/2015  CLINICAL DATA:  Increasing low back pain, no known injury, initial encounter EXAM: LUMBAR SPINE - COMPLETE 4+ VIEW COMPARISON:  10/20/2011 FINDINGS: Five lumbar type vertebral bodies are well visualized. Degenerative changes are again seen most prominent at the L4-5 level on the right stable from the prior exam. No pars defects are seen. Mild degenerative anterolisthesis of L4 on L5 is noted stable from the prior exam. Disc space narrowing is also noted at L4-5. No other focal abnormality is seen. IMPRESSION: Lumbar degenerative change as described. Electronically Signed   By: Inez Catalina M.D.   On: 09/23/2015 14:34   Dg Hip Unilat With Pelvis 2-3 Views Right  09/23/2015  CLINICAL DATA:  Chronic right hip pain for several years, no known injury, initial encounter EXAM: DG HIP (WITH OR WITHOUT PELVIS) 2-3V RIGHT COMPARISON:  None. FINDINGS: Severe osteoarthritic changes are noted with loss of the superior joint space. Osteophytic changes are seen. No acute fracture or dislocation is noted. IMPRESSION: Severe osteoarthritis of the right hip. Electronically Signed   By: Inez Catalina M.D.   On: 09/23/2015 14:29    Assessment/Plan 1. Medicare annual wellness visit, subsequent -is up to date -has her bone density and mammogram planned through gyn - see above for annual assessment  2. Primary osteoarthritis of right hip -is now severe and interfering with her daily routine -will refer to orthopedic surgeon of her choice  3. Adjustment disorder with mixed anxiety and depressed mood -doing quite well now from this perspective  4. Senile osteoporosis -cont vitamin D , walking in pool, PT and keep DEXA appt--asked her to make sure I also get a copy  5. Hyperlipidemia with target LDL less than 100 - Lipid  panel check today  6. Post-traumatic osteoarthritis of  right knee -noted also, she says she didn't get a knee xray with the others, but this can be done by ortho if they desire also  Labs/tests ordered:   Orders Placed This Encounter  Procedures  . Lipid panel    Order Specific Question:  Has the patient fasted?    Answer:  Yes    Next appt:  6 mos med mgt  Alianis Trimmer L. Lashannon Bresnan, D.O. [facility] [address], [place] [address] Cell Phone (Mon-Fri 8am-5pm):  [phone] On Call:  [phone] & follow prompts after 5pm & weekends Office Phone:  [phone] Office Fax:  [phone]

## 2015-10-15 NOTE — Patient Instructions (Addendum)
Hip surgeons:  Dr. Jean Rosenthal  Dr. Theone Stanley Dr. Paralee Cancel

## 2015-10-16 LAB — LIPID PANEL
Chol/HDL Ratio: 2.4 ratio units (ref 0.0–4.4)
Cholesterol, Total: 242 mg/dL — ABNORMAL HIGH (ref 100–199)
HDL: 99 mg/dL (ref >39)
LDL Calculated: 124 mg/dL — ABNORMAL HIGH (ref 0–99)
Triglycerides: 96 mg/dL (ref 0–149)
VLDL Cholesterol Cal: 19 mg/dL (ref 5–40)

## 2015-10-18 ENCOUNTER — Telehealth: Payer: Self-pay | Admitting: *Deleted

## 2015-10-18 ENCOUNTER — Encounter: Payer: Self-pay | Admitting: *Deleted

## 2015-10-18 DIAGNOSIS — M25561 Pain in right knee: Secondary | ICD-10-CM | POA: Diagnosis not present

## 2015-10-18 DIAGNOSIS — R262 Difficulty in walking, not elsewhere classified: Secondary | ICD-10-CM | POA: Diagnosis not present

## 2015-10-18 DIAGNOSIS — M25511 Pain in right shoulder: Secondary | ICD-10-CM | POA: Diagnosis not present

## 2015-10-18 DIAGNOSIS — M545 Low back pain: Secondary | ICD-10-CM | POA: Diagnosis not present

## 2015-10-18 DIAGNOSIS — M1611 Unilateral primary osteoarthritis, right hip: Secondary | ICD-10-CM

## 2015-10-18 DIAGNOSIS — M79651 Pain in right thigh: Secondary | ICD-10-CM | POA: Diagnosis not present

## 2015-10-18 DIAGNOSIS — M25551 Pain in right hip: Secondary | ICD-10-CM | POA: Diagnosis not present

## 2015-10-18 NOTE — Telephone Encounter (Signed)
Patient called and stated that she would like a referral to Dr. Zollie Beckers for her hip. I read Dr. Cyndi Lennert last OV note and she stated that she was going to refer to Orthopaedic Surgeon. Referral placed. Patient cannot go until after July 9th

## 2015-10-19 ENCOUNTER — Ambulatory Visit (INDEPENDENT_AMBULATORY_CARE_PROVIDER_SITE_OTHER): Payer: Medicare Other

## 2015-10-19 ENCOUNTER — Other Ambulatory Visit: Payer: Self-pay | Admitting: Gynecology

## 2015-10-19 DIAGNOSIS — M810 Age-related osteoporosis without current pathological fracture: Secondary | ICD-10-CM | POA: Diagnosis not present

## 2015-10-19 DIAGNOSIS — Z13820 Encounter for screening for osteoporosis: Secondary | ICD-10-CM

## 2015-10-19 DIAGNOSIS — M8580 Other specified disorders of bone density and structure, unspecified site: Secondary | ICD-10-CM

## 2015-10-22 DIAGNOSIS — M25511 Pain in right shoulder: Secondary | ICD-10-CM | POA: Diagnosis not present

## 2015-10-22 DIAGNOSIS — M25551 Pain in right hip: Secondary | ICD-10-CM | POA: Diagnosis not present

## 2015-10-22 DIAGNOSIS — M545 Low back pain: Secondary | ICD-10-CM | POA: Diagnosis not present

## 2015-10-22 DIAGNOSIS — R262 Difficulty in walking, not elsewhere classified: Secondary | ICD-10-CM | POA: Diagnosis not present

## 2015-10-22 DIAGNOSIS — M79651 Pain in right thigh: Secondary | ICD-10-CM | POA: Diagnosis not present

## 2015-10-22 DIAGNOSIS — M25561 Pain in right knee: Secondary | ICD-10-CM | POA: Diagnosis not present

## 2015-10-25 DIAGNOSIS — M25511 Pain in right shoulder: Secondary | ICD-10-CM | POA: Diagnosis not present

## 2015-10-25 DIAGNOSIS — M545 Low back pain: Secondary | ICD-10-CM | POA: Diagnosis not present

## 2015-10-25 DIAGNOSIS — R262 Difficulty in walking, not elsewhere classified: Secondary | ICD-10-CM | POA: Diagnosis not present

## 2015-10-25 DIAGNOSIS — M79651 Pain in right thigh: Secondary | ICD-10-CM | POA: Diagnosis not present

## 2015-10-25 DIAGNOSIS — M25551 Pain in right hip: Secondary | ICD-10-CM | POA: Diagnosis not present

## 2015-10-25 DIAGNOSIS — M25561 Pain in right knee: Secondary | ICD-10-CM | POA: Diagnosis not present

## 2015-10-29 DIAGNOSIS — M79651 Pain in right thigh: Secondary | ICD-10-CM | POA: Diagnosis not present

## 2015-10-29 DIAGNOSIS — M25511 Pain in right shoulder: Secondary | ICD-10-CM | POA: Diagnosis not present

## 2015-10-29 DIAGNOSIS — M25561 Pain in right knee: Secondary | ICD-10-CM | POA: Diagnosis not present

## 2015-10-29 DIAGNOSIS — M545 Low back pain: Secondary | ICD-10-CM | POA: Diagnosis not present

## 2015-10-29 DIAGNOSIS — M25551 Pain in right hip: Secondary | ICD-10-CM | POA: Diagnosis not present

## 2015-10-29 DIAGNOSIS — R262 Difficulty in walking, not elsewhere classified: Secondary | ICD-10-CM | POA: Diagnosis not present

## 2015-11-01 DIAGNOSIS — M545 Low back pain: Secondary | ICD-10-CM | POA: Diagnosis not present

## 2015-11-01 DIAGNOSIS — M79651 Pain in right thigh: Secondary | ICD-10-CM | POA: Diagnosis not present

## 2015-11-01 DIAGNOSIS — R262 Difficulty in walking, not elsewhere classified: Secondary | ICD-10-CM | POA: Diagnosis not present

## 2015-11-01 DIAGNOSIS — M25511 Pain in right shoulder: Secondary | ICD-10-CM | POA: Diagnosis not present

## 2015-11-01 DIAGNOSIS — M25561 Pain in right knee: Secondary | ICD-10-CM | POA: Diagnosis not present

## 2015-11-01 DIAGNOSIS — M25551 Pain in right hip: Secondary | ICD-10-CM | POA: Diagnosis not present

## 2015-11-05 DIAGNOSIS — M25551 Pain in right hip: Secondary | ICD-10-CM | POA: Diagnosis not present

## 2015-11-05 DIAGNOSIS — M79651 Pain in right thigh: Secondary | ICD-10-CM | POA: Diagnosis not present

## 2015-11-05 DIAGNOSIS — M25511 Pain in right shoulder: Secondary | ICD-10-CM | POA: Diagnosis not present

## 2015-11-05 DIAGNOSIS — R262 Difficulty in walking, not elsewhere classified: Secondary | ICD-10-CM | POA: Diagnosis not present

## 2015-11-05 DIAGNOSIS — M25561 Pain in right knee: Secondary | ICD-10-CM | POA: Diagnosis not present

## 2015-11-05 DIAGNOSIS — M545 Low back pain: Secondary | ICD-10-CM | POA: Diagnosis not present

## 2015-11-08 DIAGNOSIS — M25511 Pain in right shoulder: Secondary | ICD-10-CM | POA: Diagnosis not present

## 2015-11-08 DIAGNOSIS — M545 Low back pain: Secondary | ICD-10-CM | POA: Diagnosis not present

## 2015-11-08 DIAGNOSIS — M25561 Pain in right knee: Secondary | ICD-10-CM | POA: Diagnosis not present

## 2015-11-08 DIAGNOSIS — M1611 Unilateral primary osteoarthritis, right hip: Secondary | ICD-10-CM | POA: Diagnosis not present

## 2015-11-08 DIAGNOSIS — M25551 Pain in right hip: Secondary | ICD-10-CM | POA: Diagnosis not present

## 2015-11-08 DIAGNOSIS — R262 Difficulty in walking, not elsewhere classified: Secondary | ICD-10-CM | POA: Diagnosis not present

## 2015-11-08 DIAGNOSIS — M79651 Pain in right thigh: Secondary | ICD-10-CM | POA: Diagnosis not present

## 2015-11-12 DIAGNOSIS — M79651 Pain in right thigh: Secondary | ICD-10-CM | POA: Diagnosis not present

## 2015-11-12 DIAGNOSIS — M25551 Pain in right hip: Secondary | ICD-10-CM | POA: Diagnosis not present

## 2015-11-12 DIAGNOSIS — M545 Low back pain: Secondary | ICD-10-CM | POA: Diagnosis not present

## 2015-11-12 DIAGNOSIS — R262 Difficulty in walking, not elsewhere classified: Secondary | ICD-10-CM | POA: Diagnosis not present

## 2015-11-12 DIAGNOSIS — M25561 Pain in right knee: Secondary | ICD-10-CM | POA: Diagnosis not present

## 2015-11-12 DIAGNOSIS — M25511 Pain in right shoulder: Secondary | ICD-10-CM | POA: Diagnosis not present

## 2015-11-15 DIAGNOSIS — R262 Difficulty in walking, not elsewhere classified: Secondary | ICD-10-CM | POA: Diagnosis not present

## 2015-11-15 DIAGNOSIS — M25551 Pain in right hip: Secondary | ICD-10-CM | POA: Diagnosis not present

## 2015-11-15 DIAGNOSIS — M545 Low back pain: Secondary | ICD-10-CM | POA: Diagnosis not present

## 2015-11-15 DIAGNOSIS — M25511 Pain in right shoulder: Secondary | ICD-10-CM | POA: Diagnosis not present

## 2015-11-15 DIAGNOSIS — M79651 Pain in right thigh: Secondary | ICD-10-CM | POA: Diagnosis not present

## 2015-11-15 DIAGNOSIS — M25561 Pain in right knee: Secondary | ICD-10-CM | POA: Diagnosis not present

## 2015-11-19 DIAGNOSIS — M79651 Pain in right thigh: Secondary | ICD-10-CM | POA: Diagnosis not present

## 2015-11-19 DIAGNOSIS — M25551 Pain in right hip: Secondary | ICD-10-CM | POA: Diagnosis not present

## 2015-11-19 DIAGNOSIS — M25511 Pain in right shoulder: Secondary | ICD-10-CM | POA: Diagnosis not present

## 2015-11-19 DIAGNOSIS — R262 Difficulty in walking, not elsewhere classified: Secondary | ICD-10-CM | POA: Diagnosis not present

## 2015-11-19 DIAGNOSIS — M25561 Pain in right knee: Secondary | ICD-10-CM | POA: Diagnosis not present

## 2015-11-19 DIAGNOSIS — M545 Low back pain: Secondary | ICD-10-CM | POA: Diagnosis not present

## 2015-11-22 DIAGNOSIS — M25511 Pain in right shoulder: Secondary | ICD-10-CM | POA: Diagnosis not present

## 2015-11-22 DIAGNOSIS — M79651 Pain in right thigh: Secondary | ICD-10-CM | POA: Diagnosis not present

## 2015-11-22 DIAGNOSIS — R262 Difficulty in walking, not elsewhere classified: Secondary | ICD-10-CM | POA: Diagnosis not present

## 2015-11-22 DIAGNOSIS — M25551 Pain in right hip: Secondary | ICD-10-CM | POA: Diagnosis not present

## 2015-11-22 DIAGNOSIS — M545 Low back pain: Secondary | ICD-10-CM | POA: Diagnosis not present

## 2015-11-22 DIAGNOSIS — M25561 Pain in right knee: Secondary | ICD-10-CM | POA: Diagnosis not present

## 2015-11-26 DIAGNOSIS — M25551 Pain in right hip: Secondary | ICD-10-CM | POA: Diagnosis not present

## 2015-11-26 DIAGNOSIS — M25511 Pain in right shoulder: Secondary | ICD-10-CM | POA: Diagnosis not present

## 2015-11-26 DIAGNOSIS — R262 Difficulty in walking, not elsewhere classified: Secondary | ICD-10-CM | POA: Diagnosis not present

## 2015-11-26 DIAGNOSIS — M79651 Pain in right thigh: Secondary | ICD-10-CM | POA: Diagnosis not present

## 2015-11-26 DIAGNOSIS — M545 Low back pain: Secondary | ICD-10-CM | POA: Diagnosis not present

## 2015-11-26 DIAGNOSIS — M25561 Pain in right knee: Secondary | ICD-10-CM | POA: Diagnosis not present

## 2015-11-29 ENCOUNTER — Encounter (HOSPITAL_COMMUNITY): Payer: Self-pay

## 2015-11-29 ENCOUNTER — Other Ambulatory Visit: Payer: Self-pay | Admitting: Physician Assistant

## 2015-11-29 DIAGNOSIS — M25561 Pain in right knee: Secondary | ICD-10-CM | POA: Diagnosis not present

## 2015-11-29 DIAGNOSIS — M79651 Pain in right thigh: Secondary | ICD-10-CM | POA: Diagnosis not present

## 2015-11-29 DIAGNOSIS — R262 Difficulty in walking, not elsewhere classified: Secondary | ICD-10-CM | POA: Diagnosis not present

## 2015-11-29 DIAGNOSIS — M25551 Pain in right hip: Secondary | ICD-10-CM | POA: Diagnosis not present

## 2015-11-29 DIAGNOSIS — M545 Low back pain: Secondary | ICD-10-CM | POA: Diagnosis not present

## 2015-11-29 DIAGNOSIS — M25511 Pain in right shoulder: Secondary | ICD-10-CM | POA: Diagnosis not present

## 2015-11-30 ENCOUNTER — Encounter (HOSPITAL_COMMUNITY)
Admission: RE | Admit: 2015-11-30 | Discharge: 2015-11-30 | Disposition: A | Discharge status: Home / Self Care | Payer: Medicare Other | Source: Ambulatory Visit | Attending: Orthopaedic Surgery | Admitting: Orthopaedic Surgery

## 2015-11-30 ENCOUNTER — Encounter (HOSPITAL_COMMUNITY): Payer: Self-pay

## 2015-11-30 DIAGNOSIS — Z01812 Encounter for preprocedural laboratory examination: Secondary | ICD-10-CM | POA: Diagnosis not present

## 2015-11-30 DIAGNOSIS — M1611 Unilateral primary osteoarthritis, right hip: Secondary | ICD-10-CM | POA: Insufficient documentation

## 2015-11-30 DIAGNOSIS — Z0183 Encounter for blood typing: Secondary | ICD-10-CM | POA: Insufficient documentation

## 2015-11-30 HISTORY — DX: Unspecified temporomandibular joint disorder, unspecified side: M26.609

## 2015-11-30 LAB — CBC
HCT: 42 % (ref 36.0–46.0)
Hemoglobin: 14 g/dL (ref 12.0–15.0)
MCH: 32 pg (ref 26.0–34.0)
MCHC: 33.3 g/dL (ref 30.0–36.0)
MCV: 95.9 fL (ref 78.0–100.0)
PLATELETS: 277 10*3/uL (ref 150–400)
RBC: 4.38 MIL/uL (ref 3.87–5.11)
RDW: 13.9 % (ref 11.5–15.5)
WBC: 9.6 10*3/uL (ref 4.0–10.5)

## 2015-11-30 LAB — BASIC METABOLIC PANEL
Anion gap: 6 (ref 5–15)
BUN: 19 mg/dL (ref 6–20)
CALCIUM: 10 mg/dL (ref 8.9–10.3)
CO2: 26 mmol/L (ref 22–32)
CREATININE: 0.65 mg/dL (ref 0.44–1.00)
Chloride: 105 mmol/L (ref 101–111)
Glucose, Bld: 93 mg/dL (ref 65–99)
Potassium: 4.7 mmol/L (ref 3.5–5.1)
SODIUM: 137 mmol/L (ref 135–145)

## 2015-11-30 LAB — SURGICAL PCR SCREEN
MRSA, PCR: NEGATIVE
Staphylococcus aureus: NEGATIVE

## 2015-11-30 LAB — ABO/RH: ABO/RH(D): O POS

## 2015-11-30 NOTE — Patient Instructions (Addendum)
Rachel Morris  11/30/2015   Your procedure is scheduled on: 12-10-15  Report to [facility]  Entrance take [facility]  elevators to 3rd floor to  [place] at   0800 AM.  Call this number if you have problems the morning of surgery [phone]   Remember: ONLY 1 PERSON MAY GO WITH YOU TO SHORT STAY TO GET  READY MORNING OF [place].  Do not eat food or drink liquids :After Midnight.     Take these medicines the morning of surgery with A SIP OF WATER:Tylenol -if need DO NOT TAKE ANY DIABETIC MEDICATIONS DAY OF YOUR SURGERY                               You may not have any metal on your body including hair pins and              piercings  Do not wear jewelry, make-up, lotions, powders or perfumes, deodorant             Do not wear nail polish.  Do not shave  48 hours prior to surgery.              Men may shave face and neck.   Do not bring valuables to the hospital. [place].  Contacts, dentures or bridgework may not be worn into surgery.  Leave suitcase in the car. After surgery it may be brought to your room.     Patients discharged the day of surgery will not be allowed to drive home.  Name and phone number of your driver:Leslie sanders-daughter [phone] cell  Special Instructions: N/A              Please read over the following fact sheets you were given: _____________________________________________________________________             [facility] - Preparing for Surgery Before surgery, you can play an important role.  Because skin is not sterile, your skin needs to be as free of germs as possible.  You can reduce the number of germs on your skin by washing with CHG (chlorahexidine gluconate) soap before surgery.  CHG is an antiseptic cleaner which kills germs and bonds with the skin to continue killing germs even after washing. Please DO NOT use if you have an allergy to CHG or  antibacterial soaps.  If your skin becomes reddened/irritated stop using the CHG and inform your nurse when you arrive at Short Stay. Do not shave (including legs and underarms) for at least 48 hours prior to the first CHG shower.  You may shave your face/neck. Please follow these instructions carefully:  1.  Shower with CHG Soap the night before surgery and the  morning of Surgery.  2.  If you choose to wash your hair, wash your hair first as usual with your  normal  shampoo.  3.  After you shampoo, rinse your hair and body thoroughly to remove the  shampoo.                           4.  Use CHG as you would any other liquid soap.  You can apply chg directly  to  the skin and wash                       Gently with a scrungie or clean washcloth.  5.  Apply the CHG Soap to your body ONLY FROM THE NECK DOWN.   Do not use on face/ open                           Wound or open sores. Avoid contact with eyes, ears mouth and genitals (private parts).                       Wash face,  Genitals (private parts) with your normal soap.             6.  Wash thoroughly, paying special attention to the area where your surgery  will be performed.  7.  Thoroughly rinse your body with warm water from the neck down.  8.  DO NOT shower/wash with your normal soap after using and rinsing off  the CHG Soap.                9.  Pat yourself dry with a clean towel.            10.  Wear clean pajamas.            11.  Place clean sheets on your bed the night of your first shower and do not  sleep with pets. Day of Surgery : Do not apply any lotions/deodorants the morning of surgery.  Please wear clean clothes to the hospital/surgery center.  FAILURE TO FOLLOW THESE INSTRUCTIONS MAY RESULT IN THE CANCELLATION OF YOUR SURGERY PATIENT SIGNATURE_________________________________  NURSE SIGNATURE__________________________________  ________________________________________________________________________   Adam Phenix  An incentive spirometer is a tool that can help keep your lungs clear and active. This tool measures how well you are filling your lungs with each breath. Taking long deep breaths may help reverse or decrease the chance of developing breathing (pulmonary) problems (especially infection) following:  A long period of time when you are unable to move or be active. BEFORE THE PROCEDURE   If the spirometer includes an indicator to show your best effort, your nurse or respiratory therapist will set it to a desired goal.  If possible, sit up straight or lean slightly forward. Try not to slouch.  Hold the incentive spirometer in an upright position. INSTRUCTIONS FOR USE  1. Sit on the edge of your bed if possible, or sit up as far as you can in bed or on a chair. 2. Hold the incentive spirometer in an upright position. 3. Breathe out normally. 4. Place the mouthpiece in your mouth and seal your lips tightly around it. 5. Breathe in slowly and as deeply as possible, raising the piston or the ball toward the top of the column. 6. Hold your breath for 3-5 seconds or for as long as possible. Allow the piston or ball to fall to the bottom of the column. 7. Remove the mouthpiece from your mouth and breathe out normally. 8. Rest for a few seconds and repeat Steps 1 through 7 at least 10 times every 1-2 hours when you are awake. Take your time and take a few normal breaths between deep breaths. 9. The spirometer may include an indicator to show your best effort. Use the indicator as a goal to work toward during each repetition. 10. After each set  of 10 deep breaths, practice coughing to be sure your lungs are clear. If you have an incision (the cut made at the time of surgery), support your incision when coughing by placing a pillow or rolled up towels firmly against it. Once you are able to get out of bed, walk around indoors and cough well. You may stop using the incentive spirometer when  instructed by your caregiver.  RISKS AND COMPLICATIONS  Take your time so you do not get dizzy or light-headed.  If you are in pain, you may need to take or ask for pain medication before doing incentive spirometry. It is harder to take a deep breath if you are having pain. AFTER USE  Rest and breathe slowly and easily.  It can be helpful to keep track of a log of your progress. Your caregiver can provide you with a simple table to help with this. If you are using the spirometer at home, follow these instructions: [place] IF:   You are having difficultly using the spirometer.  You have trouble using the spirometer as often as instructed.  Your pain medication is not giving enough relief while using the spirometer.  You develop fever of 100.5 F (38.1 C) or higher. SEEK IMMEDIATE MEDICAL CARE IF:   You cough up bloody sputum that had not been present before.  You develop fever of 102 F (38.9 C) or greater.  You develop worsening pain at or near the incision site. MAKE SURE YOU:   Understand these instructions.  Will watch your condition.  Will get help right away if you are not doing well or get worse. Document Released: 08/28/2006 Document Revised: 07/10/2011 Document Reviewed: 10/29/2006 [facility] Patient Information 2014 [place], [place].   ________________________________________________________________________

## 2015-11-30 NOTE — Pre-Procedure Instructions (Signed)
EKG 4'17 Epic.

## 2015-12-03 DIAGNOSIS — M25511 Pain in right shoulder: Secondary | ICD-10-CM | POA: Diagnosis not present

## 2015-12-03 DIAGNOSIS — M545 Low back pain: Secondary | ICD-10-CM | POA: Diagnosis not present

## 2015-12-03 DIAGNOSIS — M25561 Pain in right knee: Secondary | ICD-10-CM | POA: Diagnosis not present

## 2015-12-03 DIAGNOSIS — M79651 Pain in right thigh: Secondary | ICD-10-CM | POA: Diagnosis not present

## 2015-12-03 DIAGNOSIS — R262 Difficulty in walking, not elsewhere classified: Secondary | ICD-10-CM | POA: Diagnosis not present

## 2015-12-03 DIAGNOSIS — M25551 Pain in right hip: Secondary | ICD-10-CM | POA: Diagnosis not present

## 2015-12-10 ENCOUNTER — Inpatient Hospital Stay (HOSPITAL_COMMUNITY): Payer: Medicare Other | Admitting: Anesthesiology

## 2015-12-10 ENCOUNTER — Encounter (HOSPITAL_COMMUNITY)
Admission: RE | Disposition: A | Discharge status: Skilled Nursing Facility | Payer: Self-pay | Source: Ambulatory Visit | Attending: Orthopaedic Surgery

## 2015-12-10 ENCOUNTER — Inpatient Hospital Stay (HOSPITAL_COMMUNITY): Payer: Medicare Other

## 2015-12-10 ENCOUNTER — Encounter (HOSPITAL_COMMUNITY): Payer: Self-pay | Admitting: *Deleted

## 2015-12-10 ENCOUNTER — Inpatient Hospital Stay (HOSPITAL_COMMUNITY)
Admission: RE | Admit: 2015-12-10 | Discharge: 2015-12-13 | DRG: 470 | Disposition: A | Discharge status: Skilled Nursing Facility | Payer: Medicare Other | Source: Ambulatory Visit | Attending: Orthopaedic Surgery | Admitting: Orthopaedic Surgery

## 2015-12-10 DIAGNOSIS — K5900 Constipation, unspecified: Secondary | ICD-10-CM | POA: Diagnosis present

## 2015-12-10 DIAGNOSIS — K210 Gastro-esophageal reflux disease with esophagitis: Secondary | ICD-10-CM | POA: Diagnosis not present

## 2015-12-10 DIAGNOSIS — M1611 Unilateral primary osteoarthritis, right hip: Secondary | ICD-10-CM | POA: Diagnosis not present

## 2015-12-10 DIAGNOSIS — C44311 Basal cell carcinoma of skin of nose: Secondary | ICD-10-CM | POA: Diagnosis not present

## 2015-12-10 DIAGNOSIS — A6920 Lyme disease, unspecified: Secondary | ICD-10-CM | POA: Diagnosis not present

## 2015-12-10 DIAGNOSIS — M810 Age-related osteoporosis without current pathological fracture: Secondary | ICD-10-CM | POA: Diagnosis present

## 2015-12-10 DIAGNOSIS — M25561 Pain in right knee: Secondary | ICD-10-CM | POA: Diagnosis not present

## 2015-12-10 DIAGNOSIS — M6281 Muscle weakness (generalized): Secondary | ICD-10-CM | POA: Diagnosis not present

## 2015-12-10 DIAGNOSIS — K219 Gastro-esophageal reflux disease without esophagitis: Secondary | ICD-10-CM | POA: Diagnosis present

## 2015-12-10 DIAGNOSIS — Z419 Encounter for procedure for purposes other than remedying health state, unspecified: Secondary | ICD-10-CM

## 2015-12-10 DIAGNOSIS — Z96641 Presence of right artificial hip joint: Secondary | ICD-10-CM | POA: Diagnosis not present

## 2015-12-10 DIAGNOSIS — F419 Anxiety disorder, unspecified: Secondary | ICD-10-CM | POA: Diagnosis not present

## 2015-12-10 DIAGNOSIS — R262 Difficulty in walking, not elsewhere classified: Secondary | ICD-10-CM | POA: Diagnosis not present

## 2015-12-10 DIAGNOSIS — Z8711 Personal history of peptic ulcer disease: Secondary | ICD-10-CM

## 2015-12-10 DIAGNOSIS — E785 Hyperlipidemia, unspecified: Secondary | ICD-10-CM | POA: Diagnosis not present

## 2015-12-10 DIAGNOSIS — Z471 Aftercare following joint replacement surgery: Secondary | ICD-10-CM | POA: Diagnosis not present

## 2015-12-10 DIAGNOSIS — M545 Low back pain: Secondary | ICD-10-CM | POA: Diagnosis not present

## 2015-12-10 DIAGNOSIS — M25551 Pain in right hip: Secondary | ICD-10-CM | POA: Diagnosis not present

## 2015-12-10 DIAGNOSIS — M1990 Unspecified osteoarthritis, unspecified site: Secondary | ICD-10-CM | POA: Diagnosis not present

## 2015-12-10 DIAGNOSIS — C44329 Squamous cell carcinoma of skin of other parts of face: Secondary | ICD-10-CM | POA: Diagnosis not present

## 2015-12-10 DIAGNOSIS — M659 Synovitis and tenosynovitis, unspecified: Secondary | ICD-10-CM | POA: Diagnosis not present

## 2015-12-10 DIAGNOSIS — N814 Uterovaginal prolapse, unspecified: Secondary | ICD-10-CM | POA: Diagnosis not present

## 2015-12-10 DIAGNOSIS — M79651 Pain in right thigh: Secondary | ICD-10-CM | POA: Diagnosis not present

## 2015-12-10 DIAGNOSIS — J301 Allergic rhinitis due to pollen: Secondary | ICD-10-CM | POA: Diagnosis not present

## 2015-12-10 DIAGNOSIS — Z7982 Long term (current) use of aspirin: Secondary | ICD-10-CM | POA: Diagnosis not present

## 2015-12-10 DIAGNOSIS — R1319 Other dysphagia: Secondary | ICD-10-CM | POA: Diagnosis not present

## 2015-12-10 DIAGNOSIS — Z96649 Presence of unspecified artificial hip joint: Secondary | ICD-10-CM | POA: Diagnosis not present

## 2015-12-10 DIAGNOSIS — Z4789 Encounter for other orthopedic aftercare: Secondary | ICD-10-CM | POA: Diagnosis not present

## 2015-12-10 DIAGNOSIS — L2089 Other atopic dermatitis: Secondary | ICD-10-CM | POA: Diagnosis not present

## 2015-12-10 DIAGNOSIS — M25511 Pain in right shoulder: Secondary | ICD-10-CM | POA: Diagnosis not present

## 2015-12-10 DIAGNOSIS — N952 Postmenopausal atrophic vaginitis: Secondary | ICD-10-CM | POA: Diagnosis not present

## 2015-12-10 DIAGNOSIS — M249 Joint derangement, unspecified: Secondary | ICD-10-CM | POA: Diagnosis not present

## 2015-12-10 HISTORY — PX: TOTAL HIP ARTHROPLASTY: SHX124

## 2015-12-10 LAB — TYPE AND SCREEN
ABO/RH(D): O POS
Antibody Screen: NEGATIVE

## 2015-12-10 SURGERY — ARTHROPLASTY, HIP, TOTAL, ANTERIOR APPROACH
Anesthesia: Spinal | Site: Hip | Laterality: Right

## 2015-12-10 MED ORDER — DOCUSATE SODIUM 100 MG PO CAPS
100.0000 mg | ORAL_CAPSULE | Freq: Two times a day (BID) | ORAL | Status: DC
  Administered 2015-12-10 – 2015-12-13 (×6): 100 mg via ORAL
  Filled 2015-12-10 (×5): qty 1

## 2015-12-10 MED ORDER — LACTATED RINGERS IV SOLN
INTRAVENOUS | Status: DC
  Administered 2015-12-10 (×2): via INTRAVENOUS

## 2015-12-10 MED ORDER — ACETAMINOPHEN 325 MG PO TABS: 650.0000 mg | ORAL_TABLET | Freq: Four times a day (QID) | ORAL | Status: DC | PRN

## 2015-12-10 MED ORDER — DIPHENHYDRAMINE HCL 50 MG/ML IJ SOLN: 12.5000 mg | Freq: Four times a day (QID) | INTRAMUSCULAR | Status: DC | PRN

## 2015-12-10 MED ORDER — ACETAMINOPHEN 650 MG RE SUPP: 650.0000 mg | Freq: Four times a day (QID) | RECTAL | Status: DC | PRN

## 2015-12-10 MED ORDER — SODIUM CHLORIDE 0.9 % IV SOLN
INTRAVENOUS | Status: DC
  Administered 2015-12-10: 75 mL/h via INTRAVENOUS

## 2015-12-10 MED ORDER — TRANEXAMIC ACID 1000 MG/10ML IV SOLN
1000.0000 mg | INTRAVENOUS | Status: AC
  Administered 2015-12-10: 1000 mg via INTRAVENOUS
  Filled 2015-12-10: qty 1100

## 2015-12-10 MED ORDER — METHOCARBAMOL 500 MG PO TABS
500.0000 mg | ORAL_TABLET | Freq: Four times a day (QID) | ORAL | Status: DC | PRN
  Administered 2015-12-10 – 2015-12-13 (×7): 500 mg via ORAL
  Filled 2015-12-10 (×7): qty 1

## 2015-12-10 MED ORDER — HYDROCODONE-ACETAMINOPHEN 5-325 MG PO TABS
1.0000 | ORAL_TABLET | ORAL | Status: DC | PRN
  Administered 2015-12-10 (×2): 1 via ORAL
  Administered 2015-12-10 – 2015-12-11 (×2): 2 via ORAL
  Administered 2015-12-11 – 2015-12-13 (×8): 1 via ORAL
  Filled 2015-12-10 (×3): qty 1
  Filled 2015-12-10 (×2): qty 2
  Filled 2015-12-10 (×3): qty 1
  Filled 2015-12-10: qty 2
  Filled 2015-12-10 (×2): qty 1
  Filled 2015-12-10: qty 2

## 2015-12-10 MED ORDER — PROPOFOL 500 MG/50ML IV EMUL
INTRAVENOUS | Status: DC | PRN
  Administered 2015-12-10: 50 ug/kg/min via INTRAVENOUS

## 2015-12-10 MED ORDER — PROPOFOL 10 MG/ML IV BOLUS
INTRAVENOUS | Status: AC
  Filled 2015-12-10: qty 20

## 2015-12-10 MED ORDER — OXYCODONE HCL 5 MG PO TABS: 5.0000 mg | ORAL_TABLET | Freq: Once | ORAL | Status: DC | PRN

## 2015-12-10 MED ORDER — ONDANSETRON HCL 4 MG/2ML IJ SOLN: 4.0000 mg | Freq: Four times a day (QID) | INTRAMUSCULAR | Status: DC | PRN

## 2015-12-10 MED ORDER — SODIUM CHLORIDE 0.9 % IR SOLN
Status: DC | PRN
  Administered 2015-12-10: 1000 mL

## 2015-12-10 MED ORDER — FENTANYL CITRATE (PF) 100 MCG/2ML IJ SOLN
INTRAMUSCULAR | Status: AC
  Filled 2015-12-10: qty 2

## 2015-12-10 MED ORDER — DIPHENHYDRAMINE HCL 50 MG/ML IJ SOLN
INTRAMUSCULAR | Status: AC
  Filled 2015-12-10: qty 1

## 2015-12-10 MED ORDER — PROPOFOL 10 MG/ML IV BOLUS
INTRAVENOUS | Status: DC | PRN
  Administered 2015-12-10: 120 mg via INTRAVENOUS
  Administered 2015-12-10 (×2): 20 mg via INTRAVENOUS

## 2015-12-10 MED ORDER — ONDANSETRON HCL 4 MG/2ML IJ SOLN
INTRAMUSCULAR | Status: DC | PRN
  Administered 2015-12-10: 4 mg via INTRAVENOUS

## 2015-12-10 MED ORDER — METOCLOPRAMIDE HCL 5 MG PO TABS: 5.0000 mg | ORAL_TABLET | Freq: Three times a day (TID) | ORAL | Status: DC | PRN

## 2015-12-10 MED ORDER — CHLORHEXIDINE GLUCONATE 4 % EX LIQD: 60.0000 mL | Freq: Once | CUTANEOUS | Status: DC

## 2015-12-10 MED ORDER — VANCOMYCIN HCL IN DEXTROSE 1-5 GM/200ML-% IV SOLN
1000.0000 mg | INTRAVENOUS | Status: AC
  Administered 2015-12-10: 1000 mg via INTRAVENOUS
  Filled 2015-12-10: qty 200

## 2015-12-10 MED ORDER — DEXTROSE 5 % IV SOLN
500.0000 mg | Freq: Four times a day (QID) | INTRAVENOUS | Status: DC | PRN
  Administered 2015-12-10: 500 mg via INTRAVENOUS
  Filled 2015-12-10: qty 5
  Filled 2015-12-10: qty 550

## 2015-12-10 MED ORDER — ONDANSETRON HCL 4 MG/2ML IJ SOLN
INTRAMUSCULAR | Status: AC
  Filled 2015-12-10: qty 2

## 2015-12-10 MED ORDER — VITAMIN D3 25 MCG (1000 UNIT) PO TABS
1000.0000 [IU] | ORAL_TABLET | Freq: Every day | ORAL | Status: DC
Start: 2015-12-11 — End: 2015-12-13
  Administered 2015-12-11 – 2015-12-13 (×3): 1000 [IU] via ORAL
  Filled 2015-12-10 (×3): qty 1

## 2015-12-10 MED ORDER — METOCLOPRAMIDE HCL 5 MG/ML IJ SOLN: 5.0000 mg | Freq: Three times a day (TID) | INTRAMUSCULAR | Status: DC | PRN

## 2015-12-10 MED ORDER — PROPOFOL 10 MG/ML IV BOLUS
INTRAVENOUS | Status: AC
  Filled 2015-12-10: qty 40

## 2015-12-10 MED ORDER — DIPHENHYDRAMINE HCL 50 MG/ML IJ SOLN
12.5000 mg | Freq: Once | INTRAMUSCULAR | Status: AC
  Administered 2015-12-10: 12.5 mg via INTRAVENOUS

## 2015-12-10 MED ORDER — ZOLPIDEM TARTRATE 5 MG PO TABS: 5.0000 mg | ORAL_TABLET | Freq: Every evening | ORAL | Status: DC | PRN

## 2015-12-10 MED ORDER — BUPIVACAINE IN DEXTROSE 0.75-8.25 % IT SOLN
INTRATHECAL | Status: DC | PRN
  Administered 2015-12-10: 2 mL via INTRATHECAL

## 2015-12-10 MED ORDER — FENTANYL CITRATE (PF) 100 MCG/2ML IJ SOLN
25.0000 ug | INTRAMUSCULAR | Status: DC | PRN
  Administered 2015-12-10: 50 ug via INTRAVENOUS

## 2015-12-10 MED ORDER — FENTANYL CITRATE (PF) 100 MCG/2ML IJ SOLN
INTRAMUSCULAR | Status: DC | PRN
  Administered 2015-12-10 (×3): 50 ug via INTRAVENOUS
  Administered 2015-12-10 (×3): 25 ug via INTRAVENOUS
  Administered 2015-12-10: 50 ug via INTRAVENOUS

## 2015-12-10 MED ORDER — ONDANSETRON HCL 4 MG PO TABS: 4.0000 mg | ORAL_TABLET | Freq: Four times a day (QID) | ORAL | Status: DC | PRN

## 2015-12-10 MED ORDER — EPHEDRINE SULFATE 50 MG/ML IJ SOLN
INTRAMUSCULAR | Status: AC
  Filled 2015-12-10: qty 1

## 2015-12-10 MED ORDER — SODIUM CHLORIDE 0.9 % IJ SOLN
INTRAMUSCULAR | Status: AC
  Filled 2015-12-10: qty 10

## 2015-12-10 MED ORDER — OXYCODONE HCL 5 MG/5ML PO SOLN: 5.0000 mg | Freq: Once | ORAL | Status: DC | PRN

## 2015-12-10 MED ORDER — HYDROMORPHONE HCL 1 MG/ML IJ SOLN: 0.5000 mg | INTRAMUSCULAR | Status: DC | PRN

## 2015-12-10 MED ORDER — STERILE WATER FOR IRRIGATION IR SOLN
Status: DC | PRN
  Administered 2015-12-10: 2000 mL

## 2015-12-10 MED ORDER — ASPIRIN EC 325 MG PO TBEC
325.0000 mg | DELAYED_RELEASE_TABLET | Freq: Every day | ORAL | Status: DC
  Administered 2015-12-11 – 2015-12-12 (×2): 325 mg via ORAL
  Filled 2015-12-10 (×3): qty 1

## 2015-12-10 MED ORDER — VANCOMYCIN HCL IN DEXTROSE 1-5 GM/200ML-% IV SOLN
1000.0000 mg | Freq: Two times a day (BID) | INTRAVENOUS | Status: AC
  Administered 2015-12-10: 1000 mg via INTRAVENOUS
  Filled 2015-12-10: qty 200

## 2015-12-10 SURGICAL SUPPLY — 45 items
BAG ZIPLOCK 12X15 (MISCELLANEOUS) ×3 IMPLANT
BENZOIN TINCTURE PRP APPL 2/3 (GAUZE/BANDAGES/DRESSINGS) ×3 IMPLANT
BLADE SAW SGTL 18X1.27X75 (BLADE) ×2 IMPLANT
BLADE SAW SGTL 18X1.27X75MM (BLADE) ×1
CAPT HIP TOTAL 2 ×3 IMPLANT
CELLS DAT CNTRL 66122 CELL SVR (MISCELLANEOUS) ×1 IMPLANT
CLOSURE WOUND 1/2 X4 (GAUZE/BANDAGES/DRESSINGS) ×1
CLOTH BEACON ORANGE TIMEOUT ST (SAFETY) ×3 IMPLANT
DRAPE STERI IOBAN 125X83 (DRAPES) ×3 IMPLANT
DRAPE U-SHAPE 47X51 STRL (DRAPES) ×6 IMPLANT
DRESSING AQUACEL AG SP 3.5X10 (GAUZE/BANDAGES/DRESSINGS) ×1 IMPLANT
DRSG AQUACEL AG SP 3.5X10 (GAUZE/BANDAGES/DRESSINGS) ×3
DURAPREP 26ML APPLICATOR (WOUND CARE) ×3 IMPLANT
ELECT REM PT RETURN 9FT ADLT (ELECTROSURGICAL) ×3
ELECTRODE REM PT RTRN 9FT ADLT (ELECTROSURGICAL) ×1 IMPLANT
GLOVE BIO SURGEON STRL SZ 6.5 (GLOVE) ×2 IMPLANT
GLOVE BIO SURGEON STRL SZ7 (GLOVE) ×3 IMPLANT
GLOVE BIO SURGEON STRL SZ7.5 (GLOVE) ×3 IMPLANT
GLOVE BIO SURGEONS STRL SZ 6.5 (GLOVE) ×1
GLOVE BIOGEL PI IND STRL 6.5 (GLOVE) ×1 IMPLANT
GLOVE BIOGEL PI IND STRL 7.0 (GLOVE) ×1 IMPLANT
GLOVE BIOGEL PI IND STRL 7.5 (GLOVE) ×2 IMPLANT
GLOVE BIOGEL PI IND STRL 8 (GLOVE) ×2 IMPLANT
GLOVE BIOGEL PI INDICATOR 6.5 (GLOVE) ×2
GLOVE BIOGEL PI INDICATOR 7.0 (GLOVE) ×2
GLOVE BIOGEL PI INDICATOR 7.5 (GLOVE) ×4
GLOVE BIOGEL PI INDICATOR 8 (GLOVE) ×4
GLOVE ECLIPSE 8.0 STRL XLNG CF (GLOVE) ×3 IMPLANT
GLOVE SURG SS PI 7.0 STRL IVOR (GLOVE) ×3 IMPLANT
GOWN STRL REUS W/TWL LRG LVL3 (GOWN DISPOSABLE) ×3 IMPLANT
GOWN STRL REUS W/TWL XL LVL3 (GOWN DISPOSABLE) ×9 IMPLANT
HANDPIECE INTERPULSE COAX TIP (DISPOSABLE) ×2
HOLDER FOLEY CATH W/STRAP (MISCELLANEOUS) ×3 IMPLANT
PACK ANTERIOR HIP CUSTOM (KITS) ×3 IMPLANT
RTRCTR WOUND ALEXIS 18CM MED (MISCELLANEOUS) ×3
SET HNDPC FAN SPRY TIP SCT (DISPOSABLE) ×1 IMPLANT
STRIP CLOSURE SKIN 1/2X4 (GAUZE/BANDAGES/DRESSINGS) ×2 IMPLANT
SUT ETHIBOND NAB CT1 #1 30IN (SUTURE) ×3 IMPLANT
SUT MNCRL AB 4-0 PS2 18 (SUTURE) ×3 IMPLANT
SUT VIC AB 0 CT1 36 (SUTURE) ×3 IMPLANT
SUT VIC AB 1 CT1 36 (SUTURE) ×3 IMPLANT
SUT VIC AB 2-0 CT1 27 (SUTURE) ×4
SUT VIC AB 2-0 CT1 TAPERPNT 27 (SUTURE) ×2 IMPLANT
TRAY FOLEY W/METER SILVER 14FR (SET/KITS/TRAYS/PACK) ×3 IMPLANT
YANKAUER SUCT BULB TIP NO VENT (SUCTIONS) ×3 IMPLANT

## 2015-12-10 NOTE — Anesthesia Procedure Notes (Signed)
Spinal  Patient location during procedure: OR Start time: 12/10/2015 11:40 AM End time: 12/10/2015 11:43 AM Staffing Anesthesiologist: Reginal Lutes Performed: anesthesiologist  Preanesthetic Checklist Completed: patient identified, site marked, surgical consent, pre-op evaluation, timeout performed, IV checked, risks and benefits discussed and monitors and equipment checked Spinal Block Patient position: sitting Prep: ChloraPrep Patient monitoring: heart rate, continuous pulse ox, blood pressure and cardiac monitor Approach: midline Location: L4-5 Injection technique: single-shot Needle Needle type: Pencil-Tip  Needle gauge: 22 G Assessment Sensory level: T6

## 2015-12-10 NOTE — Evaluation (Signed)
Physical Therapy Evaluation Patient Details Name: Rachel Morris MRN: [id number] DOB: Nov 01, 1930 Today's Date: 12/10/2015   History of Present Illness  Pt s/p R THR   Clinical Impression  Pt s/p R THR presents with decreased R LE strength/ROM and post op pain limiting functional mobility.  Pt plans dc to rehab setting at [facility].    Follow Up Recommendations SNF (rehab setting at [facility] )    Equipment Recommendations  None recommended by PT    Recommendations for Other Services OT consult     Precautions / Restrictions Precautions Precautions: Fall Restrictions Weight Bearing Restrictions: No Other Position/Activity Restrictions: WBAT      Mobility  Bed Mobility Overal bed mobility: Needs Assistance;+2 for physical assistance;+ 2 for safety/equipment Bed Mobility: Supine to Sit     Supine to sit: Min assist;Mod assist;+2 for physical assistance;+2 for safety/equipment     General bed mobility comments: cues for sequence and use of L LE to self assist  Transfers Overall transfer level: Needs assistance Equipment used: Rolling walker (2 wheeled) Transfers: Sit to/from Stand Sit to Stand: Min assist;Mod assist;+2 physical assistance;+2 safety/equipment;From elevated surface         General transfer comment: cues for LE management and use of UEs to self assist  Ambulation/Gait Ambulation/Gait assistance: Min assist;Mod assist;+2 physical assistance;+2 safety/equipment Ambulation Distance (Feet): 8 Feet Assistive device: Rolling walker (2 wheeled) Gait Pattern/deviations: Step-to pattern;Decreased step length - right;Decreased step length - left;Shuffle;Trunk flexed Gait velocity: decr Gait velocity interpretation: Below normal speed for age/gender General Gait Details: cues for sequence, posture and position from [employer]            Wheelchair Mobility    Modified Rankin (Stroke Patients Only)       Balance                                              Pertinent Vitals/Pain Pain Assessment: 0-10 Pain Score: 6  Pain Location: R hip Pain Descriptors / Indicators: Aching;Sore Pain Intervention(s): Limited activity within patient's tolerance;Monitored during session;Premedicated before session;Ice applied    Home Living Family/patient expects to be discharged to:: Assisted living                 Additional Comments: Pt plans dc to rehab setting at [facility]    Prior Function Level of Independence: Independent;Independent with assistive device(s)               Hand Dominance        Extremity/Trunk Assessment   Upper Extremity Assessment: Overall WFL for tasks assessed           Lower Extremity Assessment: RLE deficits/detail      Cervical / Trunk Assessment: Kyphotic  Communication   Communication: No difficulties  Cognition Arousal/Alertness: Awake/alert Behavior During Therapy: WFL for tasks assessed/performed Overall Cognitive Status: Within Functional Limits for tasks assessed                      General Comments      Exercises Total Joint Exercises Ankle Circles/Pumps: AROM;Both;15 reps;Supine      Assessment/Plan    PT Assessment Patient needs continued PT services  PT Diagnosis Difficulty walking   PT Problem List Decreased strength;Decreased range of motion;Decreased activity tolerance;Decreased mobility;Decreased knowledge of use of DME;Pain  PT Treatment Interventions DME  instruction;Gait training;Functional mobility training;Therapeutic activities;Therapeutic exercise;Patient/family education   PT Goals (Current goals can be found in the Care Plan section) Acute Rehab PT Goals Patient Stated Goal: Regain IND and walk without pain PT Goal Formulation: With patient Time For Goal Achievement: 12/14/15 Potential to Achieve Goals: Good    Frequency 7X/week   Barriers to discharge        Co-evaluation               End  of Session Equipment Utilized During Treatment: Gait belt Activity Tolerance: Patient tolerated treatment well Patient left: in chair;with call bell/phone within reach;with family/visitor present Nurse Communication: Mobility status         Time: [id number] PT Time Calculation (min) (ACUTE ONLY): 27 min   Charges:   PT Evaluation $PT Eval Low Complexity: 1 Procedure PT Treatments $Gait Training: 8-22 mins   PT G Codes:        Rachel Morris 2016-01-09, 6:29 PM

## 2015-12-10 NOTE — Op Note (Signed)
NAMESHASMEEN, LOEFFEL NO.:  [id number]  MEDICAL RECORD NO.:  [id number]  LOCATION:  [facility]                         FACILITY:  [facility]  PHYSICIAN:  Lind Guest. Ninfa Linden, M.D.DATE OF BIRTH:  [birth year]  DATE OF PROCEDURE:  12/10/2015 DATE OF DISCHARGE:                              OPERATIVE REPORT   PREOPERATIVE DIAGNOSIS:  Severe primary osteoarthritis and degenerative joint disease, right hip.  POSTOPERATIVE DIAGNOSIS:  Severe primary osteoarthritis and degenerative joint disease, right hip.  PROCEDURE:  Right total hip arthroplasty through direct anterior approach.  IMPLANTS:  DePuy Sector Gription acetabular component size 50, size 32+ 0 neutral polyethylene liner, size 12 Corail femoral component with standard offset, size 32+ 1 ceramic hip ball.  SURGEON:  Lind Guest. Ninfa Linden, M.D.  ASSISTANT:  Erskine Emery, PA-C.  ANESTHESIA: 1. Attempted spinal. 2. General.  ANTIBIOTICS:  2 g of IV Ancef.  BLOOD LOSS:  250 mL.  COMPLICATIONS:  None.  INDICATIONS:  Ms. Isler is an 82 year old female patient, well known to me.  She is a very pleasant individual who has debilitating pain in her right hip with normal left hip.  She has tried and failed all forms of conservative treatment and she has x-rays that confirmed severe osteoarthritis of her right hip, has complete loss of superolateral joint space, flattening of the femoral head, sclerotic and cystic changes.  There were also periarticular osteophytes.  At this point, she does wish to proceed with a total hip arthroplasty.  She understands the risk of acute blood loss anemia, nerve and vessel injury, fracture, infection, dislocation, DVT.  She understands our goals are decreased pain, improved mobility and overall improved quality of life.  PROCEDURE DESCRIPTION:  After informed consent was obtained, appropriate right hip was marked.  She was brought to the operating room, where spinal  anesthesia was attempted and obtained on her and then she was laid supine on the stretcher.  Foley catheter was placed and both feet had traction boots applied to them.  Of note, she was definitely significantly short on her right leg comparing her right and left legs. She was then placed supine on the Hana fracture table with the perineal post in place and both legs in inline skeletal traction devices, but no traction applied.  Her right operative hip was prepped and draped with DuraPrep and sterile drapes.  A time-out was called, she was identified as correct patient and correct right hip.  I first tested her pain tolerance with pinching the skin and she actually could feel this, so decision was made to proceed with general anesthesia.  Once that had set in, we then made incision just inferior and posterior to the anterior superior iliac spine and carried this obliquely down the leg.  We dissected down the tensor fascia lata muscle.  Tensor fascia was then divided longitudinally to proceed with a direct anterior approach to the hip.  We identified and cauterized the circumflex vessels and then identified the hip capsule.  I opened up the hip capsule in L-type format finding a large joint effusion.  We found significant periarticular osteophytes and we placed out Cobra retractors within the joint capsule.  We found significant synovitis as  well.  We then made our femoral neck cut with an oscillating saw and completed this with an osteotome.  We placed the corkscrew guide in the femoral head and removed the femoral head in its entirety and found it to be completely devoid of cartilage.  We then cleaned the acetabulum and remnants of the acetabular labrum and placed a bent Hohmann over the medial acetabular rim.  We then began reaming under direct visualization from a size 43 reamer up to a size 50 with all reamers under direct visualization and the last reamer under direct fluoroscopy, so  we could obtain our depth of reaming, our inclination and anteversion.  Once we were pleased with this, we placed the real DePuy Sector Gription acetabular component size 50 and a 32+ 0 neutral polyethylene liner for that size acetabular component.  Attention was then turned to the femur.  With the leg externally rotated to 120 degrees, extended and adducted, we were to place a Mueller retractor medially and a Hohmann retractor behind the greater trochanter.  We released the lateral joint capsule and used a box cutting osteotome to enter the femoral canal and rongeur to lateralize.  We then began broaching from a size 8 broach using the Corail broaching system up to a size 12.  With a size 12 in place, we trialed a standard offset femoral neck and a 32+ 1 hip ball.  We brought the leg back over and up with traction and internal rotation reducing the pelvis and we were pleased with leg length, offset, range of motion and stability.  This was also assessed under direct fluoroscopy.  We then dislocated the hip and removed the trial components.  We were able to place the real Corail femoral component with standard offset, size 12 and the real 32+ 1 ceramic hip ball and reduced this in the acetabulum and again, we were pleased with stability, leg length and offset.  We then irrigated the soft tissue with normal saline solution using pulsatile lavage.  We were able to close the joint capsule with interrupted #1 Ethibond suture followed by running #1 Vicryl in the tensor fascia, 0 Vicryl in the deep tissue, 2-0 Vicryl in the subcutaneous tissue, 4-0 Monocryl for subcuticular stitch and Steri- Strips on the skin.  An Aquacel dressing was applied.  She was taken off the Hana table, awakened, extubated and taken to the recovery room in stable condition.  All final counts were correct.  There were no complications noted.  Of note, Erskine Emery, PA-C assisted the entire case.  His assistance was  crucial for facilitating all aspects of this case.     Lind Guest. Ninfa Linden, M.D.     CYB/MEDQ  D:  12/10/2015  T:  12/10/2015  Job:  [id number]

## 2015-12-10 NOTE — Brief Op Note (Signed)
12/10/2015  12:56 PM  PATIENT:  Rachel Morris  82 y.o. female  PRE-OPERATIVE DIAGNOSIS:  Severe osteoarthritis right hip  POST-OPERATIVE DIAGNOSIS:  Severe osteoarthritis right hip  PROCEDURE:  Procedure(s): RIGHT TOTAL HIP ARTHROPLASTY ANTERIOR APPROACH (Right)  SURGEON:  Surgeon(s) and Role:    * [name], MD - Primary  PHYSICIAN ASSISTANT: Benita Stabile, PA-C  ANESTHESIA:   spinal and general  EBL:  Total I/O In: -  Out: 750 [Urine:500; Blood:250]  COUNTS:  YES  DICTATION: .Other Dictation: Dictation Number [id number]  PLAN OF CARE: Admit to inpatient   PATIENT DISPOSITION:  PACU - hemodynamically stable.   Delay start of Pharmacological VTE agent (>24hrs) due to surgical blood loss or risk of bleeding: no

## 2015-12-10 NOTE — Anesthesia Procedure Notes (Signed)
Procedure Name: LMA Insertion Date/Time: 12/10/2015 12:11 PM Performed by: Maxwell Caul Pre-anesthesia Checklist: Patient identified, Emergency Drugs available, Suction available and Patient being monitored Patient Re-evaluated:Patient Re-evaluated prior to inductionOxygen Delivery Method: Circle system utilized Preoxygenation: Pre-oxygenation with 100% oxygen Intubation Type: IV induction LMA: LMA inserted LMA Size: 4.0 Number of attempts: 1 Placement Confirmation: positive ETCO2,  CO2 detector and breath sounds checked- equal and bilateral Tube secured with: Tape Dental Injury: Teeth and Oropharynx as per pre-operative assessment

## 2015-12-10 NOTE — Anesthesia Preprocedure Evaluation (Addendum)
Anesthesia Evaluation  Patient identified by MRN, date of birth, ID band Patient awake    Reviewed: Allergy & Precautions, NPO status , Patient's Chart, lab work & pertinent test results  Airway Mallampati: I       Dental no notable dental hx.    Pulmonary neg pulmonary ROS,    Pulmonary exam normal        Cardiovascular negative cardio ROS Normal cardiovascular exam     Neuro/Psych negative neurological ROS  negative psych ROS   GI/Hepatic negative GI ROS, Neg liver ROS, GERD  ,  Endo/Other  negative endocrine ROS  Renal/GU negative Renal ROS  negative genitourinary   Musculoskeletal  (+) Arthritis , Osteoarthritis,  osteoporosis   Abdominal   Peds negative pediatric ROS (+)  Hematology negative hematology ROS (+)   Anesthesia Other Findings hyperlipidemia  Reproductive/Obstetrics negative OB ROS                            Anesthesia Physical Anesthesia Plan  ASA: I  Anesthesia Plan: Spinal   Post-op Pain Management:    Induction:   Airway Management Planned: Nasal Cannula  Additional Equipment:   Intra-op Plan:   Post-operative Plan:   Informed Consent:   Plan Discussed with: CRNA and Surgeon  Anesthesia Plan Comments:         Anesthesia Quick Evaluation

## 2015-12-10 NOTE — Transfer of Care (Signed)
Immediate Anesthesia Transfer of Care Note  Patient: Rachel Morris  Procedure(s) Performed: Procedure(s): RIGHT TOTAL HIP ARTHROPLASTY ANTERIOR APPROACH (Right)  Patient Location: PACU  Anesthesia Type:General and Spinal  Level of Consciousness:  sedated, patient cooperative and responds to stimulation  Airway & Oxygen Therapy:Patient Spontanous Breathing and Patient connected to face mask oxgen  Post-op Assessment:  Report given to PACU RN and Post -op Vital signs reviewed and stable  Post vital signs:  Reviewed and stable  Last Vitals:  Vitals:   12/10/15 0758  BP: (!) 155/76  Pulse: 88  Resp: 16  Temp: 123XX123 C    Complications: No apparent anesthesia complications

## 2015-12-10 NOTE — H&P (Signed)
TOTAL HIP ADMISSION H&P  Patient is admitted for right total hip arthroplasty.  Subjective:  Chief Complaint: right hip pain  HPI: Rachel Morris, 82 y.o. female, has a history of pain and functional disability in the right hip(s) due to arthritis and patient has failed non-surgical conservative treatments for greater than 12 weeks to include NSAID's and/or analgesics, flexibility and strengthening excercises, supervised PT with diminished ADL's post treatment, use of assistive devices and activity modification.  Onset of symptoms was gradual starting 1 years ago with gradually worsening course since that time.The patient noted no past surgery on the right hip(s).  Patient currently rates pain in the right hip at 9 out of 10 with activity. Patient has night pain, worsening of pain with activity and weight bearing, trendelenberg gait, pain that interfers with activities of daily living and pain with passive range of motion. Patient has evidence of subchondral cysts, subchondral sclerosis, periarticular osteophytes and joint space narrowing by imaging studies. This condition presents safety issues increasing the risk of falls.  There is no current active infection.  Patient Active Problem List   Diagnosis Date Noted  . Osteoarthritis of right hip 10/15/2015  . Senile osteoporosis 09/30/2012  . Reflux esophagitis 09/30/2012  . Hyperlipidemia with target LDL less than 100   . Arthritis   . Allergic rhinitis due to pollen   . Cystocele with small rectocele and uterine descent 09/09/2012  . Vaginal atrophy 09/09/2012   Past Medical History:  Diagnosis Date  . Allergic rhinitis due to pollen   . Anxiety state, unspecified   . Arthritis    RIGHT LEG, HANDS.  [name] Cancer (HCC)    FACE/NOSE -SKIN-squamous cell and basal cell.  . Disorder of bone and cartilage, unspecified   . Lumbago   . Lyme borreliosis   . Other atopic dermatitis and related conditions   . Other dysphagia   . Reflux  esophagitis   . Senile osteoporosis   . TMJ dysfunction    MORE ON LEFT "WEARS BITE PROTECTION AT NIGHT"    Past Surgical History:  Procedure Laterality Date  . APPENDECTOMY  1950  . CATARACT EXTRACTION, BILATERAL Bilateral   . EYE SURGERY Right    right eye ptrigium excision x2  . HERNIA REPAIR Bilateral     No prescriptions prior to admission.   Allergies  Allergen Reactions  . Brandy [Alcohol] Anaphylaxis    Related to grapes  . Prune Anaphylaxis    PLUMS,GRAPES,RAISINS  . Aspirin Other (See Comments)    Stomach bleed  . Augmentin [Amoxicillin-Pot Clavulanate] Hives and Itching  . Clindamycin Other (See Comments)    unkown  . Omnicef [Cefdinir] Hives  . Alprazolam Rash  . Ceftin [Cefuroxime Axetil] Rash  . Doxycycline Rash  . Eryc [Erythromycin] Rash  . Penicillins Rash    Social History  Substance Use Topics  . Smoking status: Never Smoker  . Smokeless tobacco: Never Used  . Alcohol use 0.0 oz/week     Comment:  2 shots at bedtime    No family history on file.   Review of Systems  Musculoskeletal: Positive for joint pain.  All other systems reviewed and are negative.   Objective:  Physical Exam  Constitutional: She is oriented to person, place, and time. She appears well-developed and well-nourished.  HENT:  Head: Normocephalic and atraumatic.  Eyes: EOM are normal. Pupils are equal, round, and reactive to light.  Neck: Normal range of motion. Neck supple.  Cardiovascular: Normal rate and regular  rhythm.   Respiratory: Effort normal and breath sounds normal.  GI: Soft. Bowel sounds are normal.  Musculoskeletal:       Right hip: She exhibits decreased range of motion, decreased strength, tenderness, bony tenderness and crepitus.  Neurological: She is alert and oriented to person, place, and time.  Skin: Skin is warm and dry.  Psychiatric: She has a normal mood and affect.    Vital signs in last 24 hours:    Labs:   Estimated body mass index is  23.21 kg/m as calculated from the following:   Height as of 11/30/15: 5\' 3"  (1.6 m).   Weight as of 11/30/15: 59.4 kg (131 lb).   Imaging Review Plain radiographs demonstrate severe degenerative joint disease of the right hip(s). The bone quality appears to be good for age and reported activity level.  Assessment/Plan:  End stage arthritis, right hip(s)  The patient history, physical examination, clinical judgement of the provider and imaging studies are consistent with end stage degenerative joint disease of the right hip(s) and total hip arthroplasty is deemed medically necessary. The treatment options including medical management, injection therapy, arthroscopy and arthroplasty were discussed at length. The risks and benefits of total hip arthroplasty were presented and reviewed. The risks due to aseptic loosening, infection, stiffness, dislocation/subluxation,  thromboembolic complications and other imponderables were discussed.  The patient acknowledged the explanation, agreed to proceed with the plan and consent was signed. Patient is being admitted for inpatient treatment for surgery, pain control, PT, OT, prophylactic antibiotics, VTE prophylaxis, progressive ambulation and ADL's and discharge planning.The patient is planning to be discharged to skilled nursing facility

## 2015-12-10 NOTE — Anesthesia Postprocedure Evaluation (Signed)
Anesthesia Post Note  Patient: PIEPER ROLLING  Procedure(s) Performed: Procedure(s) (LRB): RIGHT TOTAL HIP ARTHROPLASTY ANTERIOR APPROACH (Right)  Patient location during evaluation: PACU Anesthesia Type: General Level of consciousness: awake and alert Pain management: pain level controlled Vital Signs Assessment: post-procedure vital signs reviewed and stable Respiratory status: spontaneous breathing, nonlabored ventilation, respiratory function stable and patient connected to nasal cannula oxygen Cardiovascular status: blood pressure returned to baseline and stable Postop Assessment: no signs of nausea or vomiting Anesthetic complications: no    Last Vitals:  Vitals:   12/10/15 1400 12/10/15 1415  BP: 133/70 (!) 133/91  Pulse: 68 74  Resp: 14 16  Temp: 36.6 C 36.6 C    Last Pain:  Vitals:   12/10/15 1415  TempSrc: Oral  PainSc: 3                  Rahshawn Remo Minda Meo

## 2015-12-11 LAB — CBC
HEMATOCRIT: 33.6 % — AB (ref 36.0–46.0)
HEMOGLOBIN: 11.2 g/dL — AB (ref 12.0–15.0)
MCH: 32.2 pg (ref 26.0–34.0)
MCHC: 33.3 g/dL (ref 30.0–36.0)
MCV: 96.6 fL (ref 78.0–100.0)
Platelets: 226 10*3/uL (ref 150–400)
RBC: 3.48 MIL/uL — ABNORMAL LOW (ref 3.87–5.11)
RDW: 14.1 % (ref 11.5–15.5)
WBC: 10.1 10*3/uL (ref 4.0–10.5)

## 2015-12-11 LAB — BASIC METABOLIC PANEL
ANION GAP: 5 (ref 5–15)
BUN: 10 mg/dL (ref 6–20)
CALCIUM: 9.1 mg/dL (ref 8.9–10.3)
CO2: 26 mmol/L (ref 22–32)
Chloride: 104 mmol/L (ref 101–111)
Creatinine, Ser: 0.63 mg/dL (ref 0.44–1.00)
GFR calc non Af Amer: >60 mL/min (ref >60)
Glucose, Bld: 125 mg/dL — ABNORMAL HIGH (ref 65–99)
Potassium: 4 mmol/L (ref 3.5–5.1)
Sodium: 135 mmol/L (ref 135–145)

## 2015-12-11 MED ORDER — METHOCARBAMOL 500 MG PO TABS: 500.0000 mg | ORAL_TABLET | Freq: Four times a day (QID) | ORAL | 0 refills | Status: DC | PRN

## 2015-12-11 MED ORDER — ASPIRIN 325 MG PO TBEC: 325.0000 mg | DELAYED_RELEASE_TABLET | Freq: Every day | ORAL | 0 refills | Status: DC

## 2015-12-11 MED ORDER — HYDROCODONE-ACETAMINOPHEN 5-325 MG PO TABS: 1.0000 | ORAL_TABLET | ORAL | 0 refills | Status: DC | PRN

## 2015-12-11 NOTE — Progress Notes (Signed)
Subjective: 1 Day Post-Op Procedure(s) (LRB): RIGHT TOTAL HIP ARTHROPLASTY ANTERIOR APPROACH (Right) Patient reports pain as moderate.  Looks good overall.  Vitals and labs stable.  Objective: Vital signs in last 24 hours: Temp:  [97.4 F (36.3 C)-98.6 F (37 C)] 98.3 F (36.8 C) (08/12 0515) Pulse Rate:  [61-91] 83 (08/12 0515) Resp:  [11-20] 20 (08/12 0515) BP: (107-155)/(50-95) 134/56 (08/12 0515) SpO2:  [97 %-100 %] 97 % (08/12 0515) Weight:  [59.4 kg (131 lb)] 59.4 kg (131 lb) (08/11 1415)  Intake/Output from previous day: 08/11 0701 - 08/12 0700 In: 3016.3 [P.O.:480; I.V.:2426.3; IV Piggyback:110] Out: 2180 [Urine:1930; Blood:250] Intake/Output this shift: No intake/output data recorded.   Recent Labs  12/11/15 0505  HGB 11.2*    Recent Labs  12/11/15 0505  WBC 10.1  RBC 3.48*  HCT 33.6*  PLT 226    Recent Labs  12/11/15 0505  NA 135  K 4.0  CL 104  CO2 26  BUN 10  CREATININE 0.63  GLUCOSE 125*  CALCIUM 9.1   No results for input(s): LABPT, INR in the last 72 hours.  Sensation intact distally Intact pulses distally Dorsiflexion/Plantar flexion intact Incision: no drainage Compartment soft  Assessment/Plan: 1 Day Post-Op Procedure(s) (LRB): RIGHT TOTAL HIP ARTHROPLASTY ANTERIOR APPROACH (Right) Up with therapy Discharge to [facility] Monday  Sekai Gitlin Y 12/11/2015, 7:48 AM

## 2015-12-11 NOTE — Progress Notes (Signed)
OT Cancellation Note  Patient Details Name: Rachel Morris MRN: [id number] DOB: [birth year]   Cancelled Treatment:    Noted plans for SNF Monday- will defer OT eval and tx to SNF Thanks.  Betsy Pries 12/11/2015, 11:08 AM

## 2015-12-11 NOTE — NC FL2 (Signed)
[place] LEVEL OF CARE SCREENING TOOL     IDENTIFICATION  Patient Name: Rachel Morris Birthdate: [birth year] Sex: female Admission Date (Current Location): 12/10/2015  [facility] and [place] Number:  Herbalist and Address:  [facility],  [address] [place], [place]      Provider Number: [id number]  Attending Physician Name and Address:  [name], *  Relative Name and Phone Number:       Current Level of Care: SNF Recommended Level of Care: [place] Prior Approval Number:    Date Approved/Denied:   PASRR Number: [id number]  Discharge Plan: SNF    Current Diagnoses: Patient Active Problem List   Diagnosis Date Noted  . Status post total replacement of right hip 12/10/2015  . Osteoarthritis of right hip 10/15/2015  . Senile osteoporosis 09/30/2012  . Reflux esophagitis 09/30/2012  . Hyperlipidemia with target LDL less than 100   . Arthritis   . Allergic rhinitis due to pollen   . Cystocele with small rectocele and uterine descent 09/09/2012  . Vaginal atrophy 09/09/2012    Orientation RESPIRATION BLADDER Height & Weight     Self, Time, Situation, Place  Normal Continent Weight: 131 lb (59.4 kg) Height:  5\' 3"  (160 cm)  BEHAVIORAL SYMPTOMS/MOOD NEUROLOGICAL BOWEL NUTRITION STATUS      Continent Diet (Regular)  AMBULATORY STATUS COMMUNICATION OF NEEDS Skin   Limited Assist Verbally Normal, Other (Comment) (Skin Closing Hip Right)                       Personal Care Assistance Level of Assistance  Bathing, Feeding, Dressing Bathing Assistance: Independent Feeding assistance: Independent Dressing Assistance: Limited assistance     Functional Limitations Info  Sight, Hearing, Speech Sight Info: Adequate Hearing Info: Adequate Speech Info: Adequate    SPECIAL CARE FACTORS FREQUENCY  PT (By licensed PT), OT (By licensed OT)     PT Frequency: 5              Contractures  Contractures Info: Not present    Additional Factors Info  Code Status, Allergies Code Status Info: Full Allergies Info: Brandy Alcohol, Prune, Aspirin, Augmentin Amoxicillin-pot Clavulanate, Clindamycin, Omnicef Cefdinir, Alprazolam, Ceftin Cefuroxime Axetil, Doxycycline, Eryc Erythromycin, Penicillins           Current Medications (12/11/2015):  This is the current hospital active medication list Current Facility-Administered Medications  Medication Dose Route Frequency Provider Last Rate Last Dose  . acetaminophen (TYLENOL) tablet 650 mg  650 mg Oral Q6H PRN [name], MD       Or  . acetaminophen (TYLENOL) suppository 650 mg  650 mg Rectal Q6H PRN [name], MD      . aspirin EC tablet 325 mg  325 mg Oral Q breakfast [name], MD   325 mg at 12/11/15 0845  . cholecalciferol (VITAMIN D) tablet 1,000 Units  1,000 Units Oral Daily [name], MD   1,000 Units at 12/11/15 1040  . diphenhydrAMINE (BENADRYL) injection 12.5 mg  12.5 mg Intravenous Q6H PRN [name], MD      . docusate sodium (COLACE) capsule 100 mg  100 mg Oral BID [name], MD   100 mg at 12/11/15 1040  . HYDROcodone-acetaminophen (NORCO/VICODIN) 5-325 MG per tablet 1-2 tablet  1-2 tablet Oral Q4H PRN [name], MD   2 tablet at 12/11/15 0618  . HYDROmorphone (DILAUDID) injection 0.5 mg  0.5 mg Intravenous Q2H PRN [name], MD      . methocarbamol (ROBAXIN) tablet 500 mg  500 mg Oral Q6H PRN [name], MD   500 mg at 12/11/15 0618   Or  . methocarbamol (ROBAXIN) 500 mg in dextrose 5 % 50 mL IVPB  500 mg Intravenous Q6H PRN [name], MD   500 mg at 12/10/15 1500  . metoCLOPramide (REGLAN) tablet 5-10 mg  5-10 mg Oral Q8H PRN [name], MD       Or  . metoCLOPramide (REGLAN) injection 5-10 mg  5-10 mg Intravenous Q8H PRN [name], MD      . ondansetron [facility]) tablet  4 mg  4 mg Oral Q6H PRN [name], MD       Or  . ondansetron [facility]) injection 4 mg  4 mg Intravenous Q6H PRN [name], MD      . zolpidem [facility]) tablet 5 mg  5 mg Oral QHS PRN [name], MD         Discharge Medications: Please see discharge summary for a list of discharge medications.  Relevant Imaging Results:  Relevant Lab Results:   Additional Information SS# [id number]  Lia Hopping, LCSW

## 2015-12-11 NOTE — Progress Notes (Signed)
Physical Therapy Treatment Patient Details Name: Rachel Morris MRN: [id number] DOB: 1931-04-23 Today's Date: Jan 03, 2016    History of Present Illness Pt s/p R THR     PT Comments    Pt very cooperative and motivated and progressing steadily with mobility.  Follow Up Recommendations  SNF     Equipment Recommendations  None recommended by PT    Recommendations for Other Services OT consult     Precautions / Restrictions Precautions Precautions: Fall Restrictions Weight Bearing Restrictions: No Other Position/Activity Restrictions: WBAT    Mobility  Bed Mobility Overal bed mobility: Needs Assistance Bed Mobility: Supine to Sit;Sit to Supine     Supine to sit: Min assist Sit to supine: Min assist   General bed mobility comments: cues for sequence and use of L LE to self assist  Transfers Overall transfer level: Needs assistance Equipment used: Rolling walker (2 wheeled) Transfers: Sit to/from Stand Sit to Stand: From elevated surface;Min guard         General transfer comment: cues for LE management and use of UEs to self assist  Ambulation/Gait Ambulation/Gait assistance: Min guard Ambulation Distance (Feet): 85 Feet (and 15' from bathroom) Assistive device: Rolling walker (2 wheeled) Gait Pattern/deviations: Step-to pattern;Shuffle;Trunk flexed;Decreased step length - right;Decreased step length - left Gait velocity: decr Gait velocity interpretation: Below normal speed for age/gender General Gait Details: cues for sequence, posture and position from [employer]            Wheelchair Mobility    Modified Rankin (Stroke Patients Only)       Balance                                    Cognition Arousal/Alertness: Awake/alert Behavior During Therapy: WFL for tasks assessed/performed Overall Cognitive Status: Within Functional Limits for tasks assessed                      Exercises      General Comments         Pertinent Vitals/Pain Pain Assessment: 0-10 Pain Score: 5  Pain Location: R hip Pain Descriptors / Indicators: Aching;Sore Pain Intervention(s): Limited activity within patient's tolerance;Monitored during session;Premedicated before session    Home Living                      Prior Function            PT Goals (current goals can now be found in the care plan section) Acute Rehab PT Goals Patient Stated Goal: Regain IND and walk without pain PT Goal Formulation: With patient Time For Goal Achievement: 12/14/15 Potential to Achieve Goals: Good Progress towards PT goals: Progressing toward goals    Frequency  7X/week    PT Plan Current plan remains appropriate    Co-evaluation             End of Session Equipment Utilized During Treatment: Gait belt Activity Tolerance: Patient tolerated treatment well Patient left: in bed;with call bell/phone within reach;with family/visitor present     Time: [id number] PT Time Calculation (min) (ACUTE ONLY): 33 min  Charges:  $Gait Training: 23-37 mins                    G Codes:      Rachel Morris 01-03-16, 4:08 PM

## 2015-12-11 NOTE — Discharge Instructions (Signed)

## 2015-12-11 NOTE — Progress Notes (Signed)
Physical Therapy Treatment Patient Details Name: Rachel Morris MRN: [id number] DOB: [birth year] Today's Date: 12/11/2015    History of Present Illness Pt s/p R THR     PT Comments    Pt motivated and progressing well with mobility.  Follow Up Recommendations  SNF     Equipment Recommendations  None recommended by PT    Recommendations for Other Services OT consult     Precautions / Restrictions Precautions Precautions: Fall Restrictions Weight Bearing Restrictions: No Other Position/Activity Restrictions: WBAT    Mobility  Bed Mobility Overal bed mobility: Needs Assistance Bed Mobility: Supine to Sit     Supine to sit: Min assist     General bed mobility comments: cues for sequence and use of L LE to self assist  Transfers Overall transfer level: Needs assistance Equipment used: Rolling walker (2 wheeled) Transfers: Sit to/from Stand Sit to Stand: Min assist;From elevated surface         General transfer comment: cues for LE management and use of UEs to self assist  Ambulation/Gait Ambulation/Gait assistance: Min assist Ambulation Distance (Feet): 42 Feet (and 15' into bathroom) Assistive device: Rolling walker (2 wheeled) Gait Pattern/deviations: Step-to pattern;Decreased step length - right;Shuffle;Trunk flexed Gait velocity: decr Gait velocity interpretation: Below normal speed for age/gender General Gait Details: cues for sequence, posture and position from [employer]            Wheelchair Mobility    Modified Rankin (Stroke Patients Only)       Balance                                    Cognition Arousal/Alertness: Awake/alert Behavior During Therapy: WFL for tasks assessed/performed Overall Cognitive Status: Within Functional Limits for tasks assessed                      Exercises Total Joint Exercises Ankle Circles/Pumps: AROM;Both;15 reps;Supine Quad Sets: AROM;Both;10 reps;Supine Heel Slides:  AAROM;Right;20 reps;Supine Hip ABduction/ADduction: AAROM;Right;15 reps;Supine    General Comments        Pertinent Vitals/Pain Pain Assessment: 0-10 Pain Score: 5  Pain Location: R hip Pain Descriptors / Indicators: Aching;Sore Pain Intervention(s): Limited activity within patient's tolerance;Monitored during session;Premedicated before session;Ice applied    Home Living                      Prior Function            PT Goals (current goals can now be found in the care plan section) Acute Rehab PT Goals Patient Stated Goal: Regain IND and walk without pain PT Goal Formulation: With patient Time For Goal Achievement: 12/14/15 Potential to Achieve Goals: Good Progress towards PT goals: Progressing toward goals    Frequency  7X/week    PT Plan Current plan remains appropriate    Co-evaluation             End of Session Equipment Utilized During Treatment: Gait belt Activity Tolerance: Patient tolerated treatment well Patient left: in chair;with call bell/phone within reach;with chair alarm set     Time: [id number] PT Time Calculation (min) (ACUTE ONLY): 39 min  Charges:  $Gait Training: 8-22 mins $Therapeutic Exercise: 8-22 mins $Therapeutic Activity: 8-22 mins                    G Codes:      Katria Botts  12/11/2015, 12:33 PM

## 2015-12-11 NOTE — Clinical Social Work Note (Signed)
Clinical Social Work Assessment  Patient Details  Name: Rachel Morris MRN: [id number] Date of Birth: Oct 08, 1930  Date of referral:  12/11/15               Reason for consult:  Facility Placement                Permission sought to share information with:  Family Supports, Customer service manager, Case Optician, dispensing granted to share information::  Yes, Verbal Permission Granted  Name::        Agency::  [facility]  Relationship::  Daughter  Contact Information:  Johnnye Lana  Housing/Transportation Living arrangements for the past 2 months:  Apartment Source of Information:  Patient Patient Interpreter Needed:  None Criminal Activity/Legal Involvement Pertinent to Current Situation/Hospitalization:  No - Comment as needed Significant Relationships:  Adult Children Lives with:  Self, Facility Resident Do you feel safe going back to the place where you live?  Yes Need for family participation in patient care:  Yes (Comment)  Care giving concerns:  Patient reports she has been living at [facility] Patient will return to facility as SNF patient. Patient reports she filed out paper work for their healthcare center prior to hospital admission. Patient reports she hopes to be able to finish rehab in twenty days or less. Patient reports that her family is supportive of her care.    Social Worker assessment / plan: LCSWA met with patient at beside, explained reason for consult. Patient agreeable to assessment. Patient reports is a apartment resident of [place], and very independent. Patient will return to facility for rehab. Patient reports/ understands she has met three night qualifying stay and insurance will help cover the cost.  Plan: Assist patient with disposition back to Facility as temporary SNF resident. LCSWA spoke with RN Danae Chen at facility and reports admissions Coordinator is probably aware of patient, will need to verify on  Monday.   Employment status:  Retired Forensic scientist:  Medicare PT Recommendations:  [place] / Referral to community resources:  [place]  Patient/Family's Response to care:  Agreeable, patient is responding well to care.   Patient/Family's Understanding of and Emotional Response to Diagnosis, Current Treatment, and Prognosis:  " I am surprised at the progress I am making." Patient reports feelings of joy because she did not think she would recover quickly after surgery. Patient is well versed in her current treatment, prognosis and steps to recovery.   Emotional Assessment Appearance:  Appears stated age Attitude/Demeanor/Rapport:    Affect (typically observed):  Accepting, Calm, Pleasant Orientation:  Oriented to Self, Oriented to Place, Oriented to  Time, Oriented to Situation Alcohol / Substance use:  Not Applicable Psych involvement (Current and /or in the community):  No (Comment)  Discharge Needs  Concerns to be addressed:  Care Coordination Readmission within the last 30 days:  No Current discharge risk:  None Barriers to Discharge:  [place], [place] 12/11/2015, 11:22 AM

## 2015-12-12 MED ORDER — ASPIRIN EC 81 MG PO TBEC: 81.0000 mg | DELAYED_RELEASE_TABLET | Freq: Every day | ORAL | 1 refills | Status: DC

## 2015-12-12 MED ORDER — POLYETHYLENE GLYCOL 3350 17 G PO PACK
17.0000 g | PACK | Freq: Every day | ORAL | Status: DC
  Administered 2015-12-12 – 2015-12-13 (×2): 17 g via ORAL
  Filled 2015-12-12 (×3): qty 1

## 2015-12-12 NOTE — Progress Notes (Signed)
Patient ID: Rachel Morris, female   DOB: July 07, 1930, 82 y.o.   MRN: [id number] [facility] Patient reports a history of peptic ulcer disease her aspirin dose was decreased to 81 mg. She also complains of constipation and orders were written to address her constipation. Patient is comfortable at this time.

## 2015-12-12 NOTE — Progress Notes (Signed)
Physical Therapy Treatment Patient Details Name: Rachel Morris MRN: [id number] DOB: 1931-02-18 Today's Date: 12/12/2015    History of Present Illness Pt s/p R THR     PT Comments    POD # 2 am session Assisted pt OOB to amb to bathroom then in hallway.  Performed some THR TE's followed by ICE.   Follow Up Recommendations  SNF     Equipment Recommendations  None recommended by PT    Recommendations for Other Services       Precautions / Restrictions Precautions Precautions: Fall Restrictions Weight Bearing Restrictions: No Other Position/Activity Restrictions: WBAT    Mobility  Bed Mobility Overal bed mobility: Needs Assistance Bed Mobility: Supine to Sit     Supine to sit: Min assist     General bed mobility comments: cues for sequence and use of L LE to self assist  Transfers Overall transfer level: Needs assistance Equipment used: Rolling walker (2 wheeled) Transfers: Sit to/from Stand Sit to Stand: From elevated surface;Min guard         General transfer comment: cues for LE management and use of UEs to self assist  Ambulation/Gait Ambulation/Gait assistance: Min guard Ambulation Distance (Feet): 74 Feet Assistive device: Rolling walker (2 wheeled) Gait Pattern/deviations: Step-to pattern;Trunk flexed     General Gait Details: cues for sequence, posture and position from [employer]            Wheelchair Mobility    Modified Rankin (Stroke Patients Only)       Balance                                    Cognition Arousal/Alertness: Awake/alert Behavior During Therapy: WFL for tasks assessed/performed Overall Cognitive Status: Within Functional Limits for tasks assessed                      Exercises      General Comments        Pertinent Vitals/Pain Pain Assessment: 0-10 Pain Score: 3  Pain Location: R hip Pain Descriptors / Indicators: Aching;Sore Pain Intervention(s): Monitored during  session;Repositioned    Home Living                      Prior Function            PT Goals (current goals can now be found in the care plan section) Progress towards PT goals: Progressing toward goals    Frequency  7X/week    PT Plan Current plan remains appropriate    Co-evaluation             End of Session Equipment Utilized During Treatment: Gait belt Activity Tolerance: Patient tolerated treatment well Patient left: in chair;with call bell/phone within reach     Time: 0952-1016 PT Time Calculation (min) (ACUTE ONLY): 24 min  Charges:  $Gait Training: 8-22 mins $Therapeutic Activity: 8-22 mins                    G Codes:      Rica Koyanagi  PTA [facility]  Acute  Rehab Pager      [phone]

## 2015-12-12 NOTE — Progress Notes (Signed)
Physical Therapy Treatment Patient Details Name: Rachel Morris MRN: [id number] DOB: October 01, 1930 Today's Date: 12/12/2015    History of Present Illness Pt s/p R THR     PT Comments    POD # 2 pm session Assisted pt OOB to amb to bathroom then in hallway.  Positioned in recliner and applied ICE.   Follow Up Recommendations  SNF     Equipment Recommendations  None recommended by PT    Recommendations for Other Services       Precautions / Restrictions Precautions Precautions: Fall Restrictions Weight Bearing Restrictions: No Other Position/Activity Restrictions: WBAT    Mobility  Bed Mobility Overal bed mobility: Needs Assistance Bed Mobility: Supine to Sit     Supine to sit: Min assist     General bed mobility comments: cues for sequence and use of L LE to self assist  Transfers Overall transfer level: Needs assistance Equipment used: Rolling walker (2 wheeled) Transfers: Sit to/from Stand Sit to Stand: From elevated surface;Min guard         General transfer comment: cues for LE management and use of UEs to self assist  Ambulation/Gait Ambulation/Gait assistance: Min guard Ambulation Distance (Feet): 54 Feet Assistive device: Rolling walker (2 wheeled) Gait Pattern/deviations: Step-to pattern;Trunk flexed     General Gait Details: cues for sequence, posture and position from [employer]            Wheelchair Mobility    Modified Rankin (Stroke Patients Only)       Balance                                    Cognition Arousal/Alertness: Awake/alert Behavior During Therapy: WFL for tasks assessed/performed Overall Cognitive Status: Within Functional Limits for tasks assessed                      Exercises      General Comments        Pertinent Vitals/Pain Pain Assessment: 0-10 Pain Score: 3  Pain Location: R hip Pain Descriptors / Indicators: Aching;Sore Pain Intervention(s): Monitored during  session;Repositioned    Home Living                      Prior Function            PT Goals (current goals can now be found in the care plan section) Progress towards PT goals: Progressing toward goals    Frequency  7X/week    PT Plan Current plan remains appropriate    Co-evaluation             End of Session Equipment Utilized During Treatment: Gait belt Activity Tolerance: Patient tolerated treatment well Patient left: in chair;with call bell/phone within reach     Time: [id number] PT Time Calculation (min) (ACUTE ONLY): 14 min  Charges:  $Gait Training: 8-22 mins $Therapeutic Activity: 8-22 mins                    G Codes:      Rica Koyanagi  PTA [facility]  Acute  Rehab Pager      [phone]

## 2015-12-12 NOTE — Care Management Note (Signed)
Case Management Note  Patient Details  Name: Rachel Morris MRN: [id number] Date of Birth: [birth year]  Subjective/Objective:     S/p R THR               Action/Plan: Discharge Planning: AVS reviewed: Chart reviewed.  CSW following for SNF placement.    Expected Discharge Date:  12/13/2015               Expected Discharge Plan:  Skilled Nursing Facility  In-House Referral:  Clinical Social Work  Discharge planning Services  CM Consult  Post Acute Care Choice:  NA Choice offered to:  NA  DME Arranged:  N/A DME Agency:  NA  HH Arranged:  NA HH Agency:  NA  Status of Service:  Completed, signed off  If discussed at [employer] of Stay Meetings, dates discussed:    Additional Comments:  Erenest Rasher, RN 12/12/2015, 7:31 PM

## 2015-12-12 NOTE — Progress Notes (Signed)
Notified Dr Sharol Given re: pt's request for laxative. Orders given for Mag Citrate & Miralax. Did not place Mag Citrate order due to allergy to alcohol. Rachel Morris, [employer]

## 2015-12-13 DIAGNOSIS — K210 Gastro-esophageal reflux disease with esophagitis: Secondary | ICD-10-CM | POA: Diagnosis not present

## 2015-12-13 DIAGNOSIS — D62 Acute posthemorrhagic anemia: Secondary | ICD-10-CM | POA: Diagnosis not present

## 2015-12-13 DIAGNOSIS — Z4789 Encounter for other orthopedic aftercare: Secondary | ICD-10-CM | POA: Diagnosis not present

## 2015-12-13 DIAGNOSIS — M79651 Pain in right thigh: Secondary | ICD-10-CM | POA: Diagnosis not present

## 2015-12-13 DIAGNOSIS — F419 Anxiety disorder, unspecified: Secondary | ICD-10-CM | POA: Diagnosis not present

## 2015-12-13 DIAGNOSIS — M810 Age-related osteoporosis without current pathological fracture: Secondary | ICD-10-CM | POA: Diagnosis not present

## 2015-12-13 DIAGNOSIS — M25511 Pain in right shoulder: Secondary | ICD-10-CM | POA: Diagnosis not present

## 2015-12-13 DIAGNOSIS — C44329 Squamous cell carcinoma of skin of other parts of face: Secondary | ICD-10-CM | POA: Diagnosis not present

## 2015-12-13 DIAGNOSIS — M249 Joint derangement, unspecified: Secondary | ICD-10-CM | POA: Diagnosis not present

## 2015-12-13 DIAGNOSIS — Z96649 Presence of unspecified artificial hip joint: Secondary | ICD-10-CM | POA: Diagnosis not present

## 2015-12-13 DIAGNOSIS — N952 Postmenopausal atrophic vaginitis: Secondary | ICD-10-CM | POA: Diagnosis not present

## 2015-12-13 DIAGNOSIS — Z471 Aftercare following joint replacement surgery: Secondary | ICD-10-CM | POA: Diagnosis not present

## 2015-12-13 DIAGNOSIS — C44311 Basal cell carcinoma of skin of nose: Secondary | ICD-10-CM | POA: Diagnosis not present

## 2015-12-13 DIAGNOSIS — J301 Allergic rhinitis due to pollen: Secondary | ICD-10-CM | POA: Diagnosis not present

## 2015-12-13 DIAGNOSIS — M1611 Unilateral primary osteoarthritis, right hip: Secondary | ICD-10-CM | POA: Diagnosis not present

## 2015-12-13 DIAGNOSIS — R1319 Other dysphagia: Secondary | ICD-10-CM | POA: Diagnosis not present

## 2015-12-13 DIAGNOSIS — A6920 Lyme disease, unspecified: Secondary | ICD-10-CM | POA: Diagnosis not present

## 2015-12-13 DIAGNOSIS — R05 Cough: Secondary | ICD-10-CM | POA: Diagnosis not present

## 2015-12-13 DIAGNOSIS — R262 Difficulty in walking, not elsewhere classified: Secondary | ICD-10-CM | POA: Diagnosis not present

## 2015-12-13 DIAGNOSIS — N814 Uterovaginal prolapse, unspecified: Secondary | ICD-10-CM | POA: Diagnosis not present

## 2015-12-13 DIAGNOSIS — M25551 Pain in right hip: Secondary | ICD-10-CM | POA: Diagnosis not present

## 2015-12-13 DIAGNOSIS — M1990 Unspecified osteoarthritis, unspecified site: Secondary | ICD-10-CM | POA: Diagnosis not present

## 2015-12-13 DIAGNOSIS — M25561 Pain in right knee: Secondary | ICD-10-CM | POA: Diagnosis not present

## 2015-12-13 DIAGNOSIS — M6281 Muscle weakness (generalized): Secondary | ICD-10-CM | POA: Diagnosis not present

## 2015-12-13 DIAGNOSIS — L2089 Other atopic dermatitis: Secondary | ICD-10-CM | POA: Diagnosis not present

## 2015-12-13 DIAGNOSIS — M545 Low back pain: Secondary | ICD-10-CM | POA: Diagnosis not present

## 2015-12-13 DIAGNOSIS — E785 Hyperlipidemia, unspecified: Secondary | ICD-10-CM | POA: Diagnosis not present

## 2015-12-13 MED ORDER — ASPIRIN 81 MG PO CHEW: 81.0000 mg | CHEWABLE_TABLET | Freq: Two times a day (BID) | ORAL | 0 refills | Status: DC

## 2015-12-13 MED ORDER — BISACODYL 10 MG RE SUPP
10.0000 mg | Freq: Once | RECTAL | Status: DC
  Filled 2015-12-13: qty 1

## 2015-12-13 MED ORDER — ASPIRIN 81 MG PO CHEW
81.0000 mg | CHEWABLE_TABLET | Freq: Two times a day (BID) | ORAL | Status: DC
  Administered 2015-12-13: 81 mg via ORAL
  Filled 2015-12-13: qty 1

## 2015-12-13 MED ORDER — ASPIRIN 81 MG PO CHEW: 81.0000 mg | CHEWABLE_TABLET | Freq: Once | ORAL | 0 refills | Status: AC

## 2015-12-13 NOTE — NC FL2 (Signed)
[employer] LEVEL OF CARE SCREENING TOOL     IDENTIFICATION  Patient Name: Rachel Morris Birthdate: 09/07/1930 Sex: female Admission Date (Current Location): 12/10/2015  [facility] and [place] Number:  Herbalist and Address:  [facility] [place],  [place], [place]      Provider Number: [id number]  Attending Physician Name and Address:  [name], *  Relative Name and Phone Number:       Current Level of Care: SNF Recommended Level of Care: [place] Prior Approval Number:    Date Approved/Denied:   PASRR Number:    Discharge Plan: SNF    Current Diagnoses: Patient Active Problem List   Diagnosis Date Noted  . Status post total replacement of right hip 12/10/2015  . Osteoarthritis of right hip 10/15/2015  . Senile osteoporosis 09/30/2012  . Reflux esophagitis 09/30/2012  . Hyperlipidemia with target LDL less than 100   . Arthritis   . Allergic rhinitis due to pollen   . Cystocele with small rectocele and uterine descent 09/09/2012  . Vaginal atrophy 09/09/2012    Orientation RESPIRATION BLADDER Height & Weight     Self, Time, Situation, Place  Normal Continent Weight: 131 lb (59.4 kg) Height:  5\' 3"  (160 cm)  BEHAVIORAL SYMPTOMS/MOOD NEUROLOGICAL BOWEL NUTRITION STATUS      Continent Diet (Regular)  AMBULATORY STATUS COMMUNICATION OF NEEDS Skin   Limited Assist Verbally Normal, Other (Comment) (Skin Closing Hip Right)                       Personal Care Assistance Level of Assistance  Bathing, Feeding, Dressing Bathing Assistance: Independent Feeding assistance: Independent Dressing Assistance: Limited assistance     Functional Limitations Info  Sight, Hearing, Speech Sight Info: Adequate Hearing Info: Adequate Speech Info: Adequate    SPECIAL CARE FACTORS FREQUENCY  PT (By licensed PT), OT (By licensed OT)     PT Frequency: 5              Contractures  Contractures Info: Not present    Additional Factors Info  Code Status, Allergies Code Status Info: Full Allergies Info: Brandy Alcohol, Prune, Aspirin, Augmentin Amoxicillin-pot Clavulanate, Clindamycin, Omnicef Cefdinir, Alprazolam, Ceftin Cefuroxime Axetil, Doxycycline, Eryc Erythromycin, Penicillins           Current Medications (12/13/2015):  This is the current hospital active medication list Current Facility-Administered Medications  Medication Dose Route Frequency Provider Last Rate Last Dose  . acetaminophen (TYLENOL) tablet 650 mg  650 mg Oral Q6H PRN [name], MD       Or  . acetaminophen (TYLENOL) suppository 650 mg  650 mg Rectal Q6H PRN [name], MD      . aspirin chewable tablet 81 mg  81 mg Oral BID [name], MD      . cholecalciferol (VITAMIN D) tablet 1,000 Units  1,000 Units Oral Daily [name], MD   1,000 Units at 12/12/15 [phone]  . diphenhydrAMINE (BENADRYL) injection 12.5 mg  12.5 mg Intravenous Q6H PRN [name], MD      . docusate sodium (COLACE) capsule 100 mg  100 mg Oral BID [name], MD   100 mg at 12/12/15 2130  . HYDROcodone-acetaminophen (NORCO/VICODIN) 5-325 MG per tablet 1-2 tablet  1-2 tablet Oral Q4H PRN [name], MD   1 tablet at 12/13/15 0756  . HYDROmorphone (DILAUDID) injection 0.5 mg  0.5 mg  Intravenous Q2H PRN [name], MD      . methocarbamol (ROBAXIN) tablet 500 mg  500 mg Oral Q6H PRN [name], MD   500 mg at 12/13/15 0422   Or  . methocarbamol (ROBAXIN) 500 mg in dextrose 5 % 50 mL IVPB  500 mg Intravenous Q6H PRN [name], MD   500 mg at 12/10/15 1500  . metoCLOPramide (REGLAN) tablet 5-10 mg  5-10 mg Oral Q8H PRN [name], MD       Or  . metoCLOPramide (REGLAN) injection 5-10 mg  5-10 mg Intravenous Q8H PRN [name], MD      . ondansetron [facility]) tablet 4 mg  4 mg Oral Q6H PRN  [name], MD       Or  . ondansetron [facility]) injection 4 mg  4 mg Intravenous Q6H PRN [name], MD      . polyethylene glycol (MIRALAX / GLYCOLAX) packet 17 g  17 g Oral Daily Newt Minion, MD   17 g at 12/12/15 1350  . zolpidem (AMBIEN) tablet 5 mg  5 mg Oral QHS PRN [name], MD         Discharge Medications: Please see discharge summary for a list of discharge medications.  Relevant Imaging Results:  Relevant Lab Results:   Additional Information SS# [id number]  Rachel Morris, Rachel An, LCSW

## 2015-12-13 NOTE — Discharge Summary (Signed)
Physician Discharge Summary  Patient ID: ADUT AEGERTER MRN: [id number] DOB/AGE: 10/29/1930 82 y.o.  Admit date: 12/10/2015 Discharge date: 12/13/2015  Admission Diagnoses:  Discharge Diagnoses:  Principal Problem:   Osteoarthritis of right hip Active Problems:   Status post total replacement of right hip   Discharged Condition: [place] Hospital Course:Uncomplicated  Consults: None  Significant Diagnostic Studies: none  Treatments: surgery: Right total hip arthroplasty  Discharge Exam: Blood pressure (!) 123/51, pulse 83, temperature 98.4 F (36.9 C), temperature source Oral, resp. rate 16, height 5\' 3"  (1.6 m), weight 59.4 kg (131 lb), SpO2 97 %.  Alert and oriented  Right lower extremity : Dressing clean dry and intact Foot NVI Calf supple and non tender   Disposition:SNF    Medication List    TAKE these medications   acetaminophen 325 MG tablet Commonly known as:  TYLENOL Take 650 mg by mouth every 6 (six) hours as needed for mild pain.   aspirin EC 81 MG tablet Take 1 tablet (81 mg total) by mouth daily.   cholecalciferol 1000 units tablet Commonly known as:  VITAMIN D Take 1,000 Units by mouth daily.   EPINEPHrine 0.3 mg/0.3 mL Soaj injection Commonly known as:  EPIPEN 2-PAK Inject 0.3 mLs (0.3 mg total) into the muscle once. As needed for allergic reaction   Garlic 123XX123 MG Tabs Take by mouth daily.   HYDROcodone-acetaminophen 5-325 MG tablet Commonly known as:  NORCO/VICODIN Take 1-2 tablets by mouth every 4 (four) hours as needed (breakthrough pain).   methocarbamol 500 MG tablet Commonly known as:  ROBAXIN Take 1 tablet (500 mg total) by mouth every 6 (six) hours as needed for muscle spasms.      Follow-up Information    [name], MD Follow up in 2 week(s).   Specialty:  Orthopedic Surgery Contact information: [place] [address] [phone]           Signed: Erskine Emery 12/13/2015, 7:39  AM

## 2015-12-13 NOTE — Progress Notes (Signed)
Physical Therapy Treatment Patient Details Name: Rachel Morris MRN: [id number] DOB: 1930/08/26 Today's Date: 12/13/2015    History of Present Illness Pt s/p R DA THR     PT Comments    Pt progressing well with mobility, she ambulated 150' with RW, no loss of balance. Performed R THA exercises with min assist. She is safe to DC by car to SNF today, from PT standpoint.   Follow Up Recommendations  SNF     Equipment Recommendations  None recommended by PT    Recommendations for Other Services       Precautions / Restrictions Precautions Precautions: Fall Restrictions Weight Bearing Restrictions: No Other Position/Activity Restrictions: WBAT    Mobility  Bed Mobility Overal bed mobility: Needs Assistance Bed Mobility: Supine to Sit     Supine to sit: Supervision     General bed mobility comments: verbal cues to self assist RLE with LLE  Transfers Overall transfer level: Needs assistance Equipment used: Rolling walker (2 wheeled) Transfers: Sit to/from Stand Sit to Stand: Min guard         General transfer comment: verbal cues for hand placement  Ambulation/Gait Ambulation/Gait assistance: Supervision Ambulation Distance (Feet): 150 Feet Assistive device: Rolling walker (2 wheeled) Gait Pattern/deviations: Decreased stride length;Step-through pattern Gait velocity: decr Gait velocity interpretation: Below normal speed for age/gender General Gait Details: cues for sequence, posture and position from RW, no LOB   Stairs            Wheelchair Mobility    Modified Rankin (Stroke Patients Only)       Balance Overall balance assessment: Modified Independent                                  Cognition Arousal/Alertness: Awake/alert Behavior During Therapy: WFL for tasks assessed/performed Overall Cognitive Status: Within Functional Limits for tasks assessed                      Exercises Total Joint Exercises Ankle  Circles/Pumps: AROM;Both;15 reps;Supine Quad Sets: AROM;Both;10 reps;Supine Short Arc Quad: AROM;Right;10 reps Heel Slides: AAROM;Right;Supine;15 reps Hip ABduction/ADduction: AAROM;Right;15 reps;Supine    General Comments        Pertinent Vitals/Pain Pain Score: 3  Pain Location: R hip with walking, 0 at rest Pain Descriptors / Indicators: Sore Pain Intervention(s): Limited activity within patient's tolerance;Monitored during session;Premedicated before session;Ice applied    Home Living                      Prior Function            PT Goals (current goals can now be found in the care plan section) Acute Rehab PT Goals Patient Stated Goal: swim in pool, gardening PT Goal Formulation: With patient Time For Goal Achievement: 12/14/15 Potential to Achieve Goals: Good Progress towards PT goals: Progressing toward goals    Frequency  7X/week    PT Plan Current plan remains appropriate    Co-evaluation             End of Session Equipment Utilized During Treatment: Gait belt Activity Tolerance: Patient tolerated treatment well Patient left: in chair;with call bell/phone within reach     Time: 0950-1022 PT Time Calculation (min) (ACUTE ONLY): 32 min  Charges:  $Gait Training: 8-22 mins $Therapeutic Exercise: 8-22 mins  G Codes:      Blondell Reveal Kistler 12/13/2015, 10:30 AM [phone]

## 2015-12-13 NOTE — Progress Notes (Signed)
Pt ready to dc to [facility] SNF today. Pt / daughter are in agreement with d/c plan. PT approved transport by car. DC Summary sent to SNF for review. Scripts included in [employer]. # for report provided to [place].  Werner Lean LCSW [phone]

## 2015-12-14 ENCOUNTER — Encounter: Payer: Self-pay | Admitting: Nurse Practitioner

## 2015-12-14 ENCOUNTER — Non-Acute Institutional Stay (SKILLED_NURSING_FACILITY): Payer: Medicare Other | Admitting: Nurse Practitioner

## 2015-12-14 DIAGNOSIS — R05 Cough: Secondary | ICD-10-CM | POA: Diagnosis not present

## 2015-12-14 DIAGNOSIS — K2100 Gastro-esophageal reflux disease with esophagitis, without bleeding: Secondary | ICD-10-CM

## 2015-12-14 DIAGNOSIS — D62 Acute posthemorrhagic anemia: Secondary | ICD-10-CM

## 2015-12-14 DIAGNOSIS — M1611 Unilateral primary osteoarthritis, right hip: Secondary | ICD-10-CM | POA: Diagnosis not present

## 2015-12-14 DIAGNOSIS — R059 Cough, unspecified: Secondary | ICD-10-CM

## 2015-12-14 NOTE — Progress Notes (Signed)
Provider:  Jarett Dralle  NP Location:    [place] Room Number: N-34 Place of Service:  SNF (31)  PCP: Hollace Kinnier, DO Patient Care Team: [name], DO as PCP - General (Geriatric Medicine) Sanchez Hemmer Otho Darner, NP as Nurse Practitioner (Internal Medicine)  Extended Emergency Contact Information Primary Emergency Contact: Augustin Schooling [address] [address] of [place] Mobile Phone: [phone] Relation: Daughter  Code Status: DNR Goals of Care: Advanced Directive information Advanced Directives 12/14/2015  Does patient have an advance directive? Yes  Type of Advance Directive Living will;Healthcare Power of Attorney  Does patient want to make changes to advanced directive? No - Patient declined  Copy of advanced directive(s) in chart? Yes  Would patient like information on creating an advanced directive? -  Pre-existing out of facility DNR order (yellow form or pink MOST form) -      Chief Complaint  Patient presents with  . New Admit To SNF    HPI: Patient is a 82 y.o. female seen today for admission to s/p right hip arthroplasty.    C/o hacking cough since yesterday after taking ASA, thought it was irritation from the pill, denied sore throat, chest pain, sputum production, she is afebrile, no O2 desaturation.   Hospitalized 12/10/15 to 12/13/15 for total replacement of right hip, WBAT, taking ASA, prn Norco, Robaxin available to her for pain management.     Past Medical History:  Diagnosis Date  . Allergic rhinitis due to pollen   . Anxiety state, unspecified   . Arthritis    RIGHT LEG, HANDS.  [name] Cancer (HCC)    FACE/NOSE -SKIN-squamous cell and basal cell.  . Disorder of bone and cartilage, unspecified   . Lumbago   . Lyme borreliosis   . Other atopic dermatitis and related conditions   . Other dysphagia   . Reflux esophagitis   . Senile osteoporosis   . TMJ dysfunction    MORE ON LEFT "WEARS BITE PROTECTION AT NIGHT"    Past Surgical History:  Procedure Laterality Date  . APPENDECTOMY  1950  . CATARACT EXTRACTION, BILATERAL Bilateral   . EYE SURGERY Right    right eye ptrigium excision x2  . HERNIA REPAIR Bilateral   . TOTAL HIP ARTHROPLASTY Right 12/10/2015   Procedure: RIGHT TOTAL HIP ARTHROPLASTY ANTERIOR APPROACH;  Surgeon: [name], MD;  Location: [facility];  Service: Orthopedics;  Laterality: Right;  Spinal to General    reports that she has never smoked. She has never used smokeless tobacco. She reports that she drinks alcohol. She reports that she does not use drugs. Social History   Social History  . Marital status: Widowed    Spouse name: N/A  . Number of children: N/A  . Years of education: N/A   Occupational History  . Not on file.   Social History Main Topics  . Smoking status: Never Smoker  . Smokeless tobacco: Never Used  . Alcohol use 0.0 oz/week     Comment:  2 shots at bedtime  . Drug use: No  . Sexual activity: Not on file   Other Topics Concern  . Not on file   Social History Narrative  . No narrative on file    Functional Status Survey:    History reviewed. No pertinent family history.  Health Maintenance  Topic Date Due  . MAMMOGRAM  10/27/2015  . INFLUENZA VACCINE  11/30/2015  . TETANUS/TDAP  05/03/2025  . DEXA SCAN  Completed  . ZOSTAVAX  Completed  . PNA vac Low Risk Adult  Completed    Allergies  Allergen Reactions  . Brandy [Alcohol] Anaphylaxis    Related to grapes  . Prune Anaphylaxis    PLUMS,GRAPES,RAISINS  . Aspirin Other (See Comments)    Stomach bleed  . Augmentin [Amoxicillin-Pot Clavulanate] Hives and Itching  . Clindamycin Other (See Comments)    unkown  . Omnicef [Cefdinir] Hives  . Alprazolam Rash  . Ceftin [Cefuroxime Axetil] Rash  . Doxycycline Rash  . Eryc [Erythromycin] Rash  . Penicillins Rash      Medication List       Accurate as of 12/14/15 11:59 PM. Always use your most recent med list.           acetaminophen 325 MG tablet Commonly known as:  TYLENOL Take 650 mg by mouth every 6 (six) hours as needed for mild pain.   aspirin EC 81 MG tablet Take 1 tablet (81 mg total) by mouth daily.   cholecalciferol 1000 units tablet Commonly known as:  VITAMIN D Take 1,000 Units by mouth daily.   EPINEPHrine 0.3 mg/0.3 mL Soaj injection Commonly known as:  EPIPEN 2-PAK Inject 0.3 mLs (0.3 mg total) into the muscle once. As needed for allergic reaction   Garlic 123XX123 MG Tabs Take by mouth daily.   HYDROcodone-acetaminophen 5-325 MG tablet Commonly known as:  NORCO/VICODIN Take 1-2 tablets by mouth every 4 (four) hours as needed (breakthrough pain).   methocarbamol 500 MG tablet Commonly known as:  ROBAXIN Take 1 tablet (500 mg total) by mouth every 6 (six) hours as needed for muscle spasms.       Review of Systems  Constitutional: Negative for activity change, appetite change, fatigue and unexpected weight change.  HENT: Negative for congestion and hearing loss.   Eyes: Negative.   Respiratory: Positive for cough. Negative for shortness of breath.   Cardiovascular: Positive for leg swelling. Negative for chest pain and palpitations.       Right leg since the hip surgery  Gastrointestinal: Negative for abdominal pain, constipation and diarrhea.  Genitourinary: Negative for difficulty urinating and dysuria.  Musculoskeletal: Positive for arthralgias, gait problem and myalgias.       Right lower back and right hip pain, affecting gait   Skin: Negative for color change and wound.       Right hip surgical incision is intact, no bleeding or drainage seen   Neurological: Negative for dizziness and weakness.  Psychiatric/Behavioral: Negative for agitation, behavioral problems and confusion. The patient is nervous/anxious.     Vitals:   12/14/15 1220  BP: (!) 142/66  Pulse: (!) 58  Resp: 18  Temp: 98.4 F (36.9 C)  Weight: 131 lb (59.4 kg)  Height: 5\' 3"  (1.6 m)   Body mass  index is 23.21 kg/m. Physical Exam  Constitutional: She is oriented to person, place, and time. She appears well-developed and well-nourished. No distress.  HENT:  Head: Normocephalic and atraumatic.  Right Ear: External ear normal.  Left Ear: External ear normal.  Nose: Nose normal.  Mouth/Throat: Oropharynx is clear and moist. No oropharyngeal exudate.  Eyes: Conjunctivae and EOM are normal. Pupils are equal, round, and reactive to light.  glasses  Neck: Normal range of motion. Neck supple. No JVD present. No tracheal deviation present. No thyromegaly present.  Cardiovascular: Normal rate, regular rhythm, normal heart sounds and intact distal pulses.   Pulmonary/Chest: Effort normal and breath  sounds normal. No respiratory distress.  Breast exam done by gyn NP  Abdominal: Soft. Bowel sounds are normal. She exhibits no distension and no mass. There is no tenderness.  Musculoskeletal: Normal range of motion. She exhibits edema and tenderness.  Right groin, right lower sacroiliac area, pain increased with right hip external rotation, no increase in pain with straight leg raise Right leg edema since the right hip surgery  Lymphadenopathy:    She has no cervical adenopathy.  Neurological: She is alert and oriented to person, place, and time. She has normal reflexes.  Skin: Skin is warm and dry.  Right hip surgical incision intact and no drainage/bleeding noted  Psychiatric: She has a normal mood and affect.    Labs reviewed: Basic Metabolic Panel:  Recent Labs  08/08/15 1440 11/30/15 1140 12/11/15 0505  NA 138 137 135  K 4.2 4.7 4.0  CL 105 105 104  CO2 27 26 26   GLUCOSE 98 93 125*  BUN 21* 19 10  CREATININE 0.76 0.65 0.63  CALCIUM 10.0 10.0 9.1   Liver Function Tests: No results for input(s): AST, ALT, ALKPHOS, BILITOT, PROT, ALBUMIN in the last 8760 hours. No results for input(s): LIPASE, AMYLASE in the last 8760 hours. No results for input(s): AMMONIA in the last 8760  hours. CBC:  Recent Labs  08/08/15 1440 11/30/15 1140 12/11/15 0505  WBC 9.5 9.6 10.1  NEUTROABS 5.3  --   --   HGB 13.6 14.0 11.2*  HCT 41.6 42.0 33.6*  MCV 96.7 95.9 96.6  PLT 259 277 226   Cardiac Enzymes: No results for input(s): CKTOTAL, CKMB, CKMBINDEX, TROPONINI in the last 8760 hours. BNP: Invalid input(s): POCBNP No results found for: HGBA1C No results found for: TSH No results found for: VITAMINB12 No results found for: FOLATE No results found for: IRON, TIBC, FERRITIN  Imaging and Procedures obtained prior to SNF admission: No results found.  Assessment/Plan There are no diagnoses linked to this encounter.Reflux esophagitis Stable, not taking acid reducer, had Hx of ASA related GI bleed per patient.   Osteoarthritis of right hip S/p total hip arthroplasty, WBAT, rehab  Cough Continue incentive spirometer, may CXR if cough persists, monitor for s/s of PNA  Acute blood loss anemia 12/11/15 Hgb 11.2, Na 135, K 4.0, Bun 10, creat 0.63     Family/ staff Communication: SNF for rehab, goal is to return [place] when [place].   Labs/tests ordered: may CXR if cough persists.

## 2015-12-15 DIAGNOSIS — R05 Cough: Secondary | ICD-10-CM | POA: Insufficient documentation

## 2015-12-15 DIAGNOSIS — R059 Cough, unspecified: Secondary | ICD-10-CM | POA: Insufficient documentation

## 2015-12-15 DIAGNOSIS — D62 Acute posthemorrhagic anemia: Secondary | ICD-10-CM | POA: Insufficient documentation

## 2015-12-15 NOTE — Assessment & Plan Note (Addendum)
Stable, not taking acid reducer, had Hx of ASA related GI bleed per patient.

## 2015-12-15 NOTE — Assessment & Plan Note (Signed)
S/p total hip arthroplasty, WBAT, rehab

## 2015-12-15 NOTE — Assessment & Plan Note (Signed)
12/11/15 Hgb 11.2, Na 135, K 4.0, Bun 10, creat 0.63

## 2015-12-15 NOTE — Assessment & Plan Note (Signed)
Continue incentive spirometer, may CXR if cough persists, monitor for s/s of PNA

## 2015-12-17 ENCOUNTER — Non-Acute Institutional Stay (SKILLED_NURSING_FACILITY): Payer: Medicare Other | Admitting: Internal Medicine

## 2015-12-17 ENCOUNTER — Encounter: Payer: Self-pay | Admitting: Internal Medicine

## 2015-12-17 DIAGNOSIS — D62 Acute posthemorrhagic anemia: Secondary | ICD-10-CM | POA: Diagnosis not present

## 2015-12-17 DIAGNOSIS — R05 Cough: Secondary | ICD-10-CM | POA: Diagnosis not present

## 2015-12-17 DIAGNOSIS — K2100 Gastro-esophageal reflux disease with esophagitis, without bleeding: Secondary | ICD-10-CM

## 2015-12-17 DIAGNOSIS — Z96649 Presence of unspecified artificial hip joint: Secondary | ICD-10-CM | POA: Diagnosis not present

## 2015-12-17 DIAGNOSIS — R059 Cough, unspecified: Secondary | ICD-10-CM

## 2015-12-17 DIAGNOSIS — M810 Age-related osteoporosis without current pathological fracture: Secondary | ICD-10-CM

## 2015-12-17 DIAGNOSIS — Z96641 Presence of right artificial hip joint: Secondary | ICD-10-CM

## 2015-12-17 NOTE — Progress Notes (Signed)
Provider:  Dr. Jeanmarie Hubert Location:   [place] Room Number: [id number] Place of Service:  SNF (73)  PCP: Hollace Kinnier, DO Patient Care Team: [name], DO as PCP - General (Geriatric Medicine) Man Otho Darner, NP as Nurse Practitioner (Internal Medicine)  Extended Emergency Contact Information Primary Emergency Contact: Augustin Schooling [address] [address] of [place] Mobile Phone: [phone] Relation: Daughter  Code Status: DNR Goals of Care: Advanced Directive information Advanced Directives 12/17/2015  Does patient have an advance directive? Yes  Type of Paramedic of [place];Out of facility DNR (pink MOST or yellow form)  Does patient want to make changes to advanced directive? -  Copy of advanced directive(s) in chart? Yes  Would patient like information on creating an advanced directive? -  Pre-existing out of facility DNR order (yellow form or pink MOST form) -      Chief Complaint  Patient presents with  . New Admit To SNF    following hospitalization 12/10/2015 to 12/13/2015 right total hip arthroplasty, Dr. Ninfa Linden    HPI: Patient is a 82 y.o. female seen today for admission to [facility] SN[facility] on 12/13/2015 following hospitalization from 12/10/2015 through 12/13/2015. Patient had end-stage osteoarthritis of the right hip and is now status post right total hip replacement by Dr. Erlinda Hong.  Patient's general health has been very good. She has chronic issues of senile osteoporosis, GERD which is asymptomatic at this time, hyperlipidemia, and TMJ on the left side.  Patient is making excellent progress with her physical therapy and rehabilitation and pain control. She was living independently prior to her surgery. I do not expect her to have to remain in skilled care long.  Past Medical History:  Diagnosis Date  . Allergic rhinitis due to pollen   . Anxiety state, unspecified   . Arthritis    RIGHT LEG,  HANDS.  [name] Cancer (HCC)    FACE/NOSE -SKIN-squamous cell and basal cell.  . Disorder of bone and cartilage, unspecified   . Lumbago   . Lyme borreliosis   . Other atopic dermatitis and related conditions   . Other dysphagia   . Reflux esophagitis   . Senile osteoporosis   . TMJ dysfunction    MORE ON LEFT "WEARS BITE PROTECTION AT NIGHT"   Past Surgical History:  Procedure Laterality Date  . APPENDECTOMY  1950  . CATARACT EXTRACTION, BILATERAL Bilateral   . EYE SURGERY Right    right eye ptrigium excision x2  . HERNIA REPAIR Bilateral   . TOTAL HIP ARTHROPLASTY Right 12/10/2015   Procedure: RIGHT TOTAL HIP ARTHROPLASTY ANTERIOR APPROACH;  Surgeon: [name], MD;  Location: [facility];  Service: Orthopedics;  Laterality: Right;  Spinal to General    reports that she has never smoked. She has never used smokeless tobacco. She reports that she drinks alcohol. She reports that she does not use drugs. Social History   Social History  . Marital status: Widowed    Spouse name: N/A  . Number of children: N/A  . Years of education: N/A   Occupational History  . Not on file.   Social History Main Topics  . Smoking status: Never Smoker  . Smokeless tobacco: Never Used  . Alcohol use 0.0 oz/week     Comment:  2 shots at bedtime  . Drug use: No  . Sexual activity: No   Other Topics Concern  . Not on  file   Social History Narrative   Admitted to [facility]13/15    Widowed   Never smoked   Alcohol none   DNR, POA     History reviewed. No pertinent family history.  Health Maintenance  Topic Date Due  . MAMMOGRAM  10/27/2015  . INFLUENZA VACCINE  11/30/2015  . TETANUS/TDAP  05/03/2025  . DEXA SCAN  Completed  . ZOSTAVAX  Completed  . PNA vac Low Risk Adult  Completed    Allergies  Allergen Reactions  . Brandy [Alcohol] Anaphylaxis    Related to grapes  . Prune Anaphylaxis    PLUMS,GRAPES,RAISINS  . Aspirin Other (See Comments)    Stomach bleed   . Augmentin [Amoxicillin-Pot Clavulanate] Hives and Itching  . Clindamycin Other (See Comments)    unkown  . Omnicef [Cefdinir] Hives  . Vinegar [Acetic Acid]     basalmic  . Alprazolam Rash  . Ceftin [Cefuroxime Axetil] Rash  . Doxycycline Rash  . Eryc [Erythromycin] Rash  . Penicillins Rash      Medication List       Accurate as of 12/17/15  9:53 AM. Always use your most recent med list.          acetaminophen 325 MG tablet Commonly known as:  TYLENOL Take 650 mg by mouth every 6 (six) hours as needed for mild pain.   aspirin EC 81 MG tablet Take 1 tablet (81 mg total) by mouth daily.   cholecalciferol 1000 units tablet Commonly known as:  VITAMIN D Take 1,000 Units by mouth daily.   EPINEPHrine 0.3 mg/0.3 mL Soaj injection Commonly known as:  EPIPEN 2-PAK Inject 0.3 mLs (0.3 mg total) into the muscle once. As needed for allergic reaction   Garlic 123XX123 MG Tabs Take by mouth daily.   HYDROcodone-acetaminophen 5-325 MG tablet Commonly known as:  NORCO/VICODIN Take 1-2 tablets by mouth every 4 (four) hours as needed (breakthrough pain).   methocarbamol 500 MG tablet Commonly known as:  ROBAXIN Take 1 tablet (500 mg total) by mouth every 6 (six) hours as needed for muscle spasms.   MIRALAX PO Take by mouth. 17 grams in 4-8 oz of fluid once daily at bedtime. Hold for loose stools       Review of Systems  Constitutional: Negative for activity change, appetite change, chills, diaphoresis, fatigue, fever and unexpected weight change.  HENT: Negative for congestion, ear discharge, ear pain, hearing loss, postnasal drip, rhinorrhea, sore throat, tinnitus, trouble swallowing and voice change.   Eyes: Negative.  Negative for pain, redness, itching and visual disturbance.  Respiratory: Positive for cough (normal CXR on 12/16/15). Negative for choking, shortness of breath and wheezing.   Cardiovascular: Positive for leg swelling (Right leg since the hip surgery).  Negative for chest pain and palpitations.  Gastrointestinal: Negative for abdominal distention, abdominal pain, constipation, diarrhea and nausea.  Endocrine: Negative for cold intolerance, heat intolerance, polydipsia, polyphagia and polyuria.  Genitourinary: Negative for difficulty urinating, dysuria, flank pain, frequency, hematuria, pelvic pain, urgency and vaginal discharge.  Musculoskeletal: Positive for arthralgias, gait problem and myalgias. Negative for back pain, neck pain and neck stiffness.       Right lower back and right hip pain, affecting gait   Skin: Negative for color change, pallor, rash and wound.       Right hip surgical incision is intact, no bleeding or drainage seen   Allergic/Immunologic: Negative.   Neurological: Negative for dizziness, tremors, seizures, syncope, weakness, numbness and headaches.  Hematological: Negative for adenopathy. Does not bruise/bleed easily.  Psychiatric/Behavioral: Negative for agitation, behavioral problems, confusion, dysphoric mood, hallucinations, sleep disturbance and suicidal ideas. The patient is nervous/anxious. The patient is not hyperactive.     Vitals:   12/17/15 0941  BP: 133/63  Pulse: 91  Temp: 99.1 F (37.3 C)  SpO2: 94%  Weight: 135 lb (61.2 kg)  Height: 5\' 3"  (1.6 m)   Body mass index is 23.91 kg/m. Physical Exam  Constitutional: She is oriented to person, place, and time. She appears well-developed and well-nourished. No distress.  HENT:  Head: Normocephalic and atraumatic.  Right Ear: External ear normal.  Left Ear: External ear normal.  Nose: Nose normal.  Mouth/Throat: Oropharynx is clear and moist. No oropharyngeal exudate.  Eyes: Conjunctivae and EOM are normal. Pupils are equal, round, and reactive to light. No scleral icterus.  glasses  Neck: Normal range of motion. Neck supple. No JVD present. No tracheal deviation present. No thyromegaly present.  Cardiovascular: Normal rate, regular rhythm, normal  heart sounds and intact distal pulses.  Exam reveals no gallop and no friction rub.   No murmur heard. Pulmonary/Chest: Effort normal and breath sounds normal. No respiratory distress. She has no wheezes. She has no rales. She exhibits no tenderness.  Breast exam done by gyn NP  Abdominal: Soft. Bowel sounds are normal. She exhibits no distension and no mass. There is no tenderness.  Musculoskeletal: Normal range of motion. She exhibits edema and tenderness.  Right groin, right lower sacroiliac area, pain increased with right hip external rotation, no increase in pain with straight leg raise Right leg edema since the right hip surgery  Lymphadenopathy:    She has no cervical adenopathy.  Neurological: She is alert and oriented to person, place, and time. She has normal reflexes. No cranial nerve deficit. Coordination normal.  Skin: Skin is warm and dry. No rash noted. She is not diaphoretic. No erythema. No pallor.  Right hip surgical incision intact and no drainage/bleeding noted  Psychiatric: She has a normal mood and affect. Her behavior is normal. Judgment and thought content normal.    Labs reviewed: Basic Metabolic Panel:  Recent Labs  08/08/15 1440 11/30/15 1140 12/11/15 0505  NA 138 137 135  K 4.2 4.7 4.0  CL 105 105 104  CO2 27 26 26   GLUCOSE 98 93 125*  BUN 21* 19 10  CREATININE 0.76 0.65 0.63  CALCIUM 10.0 10.0 9.1   CBC:  Recent Labs  08/08/15 1440 11/30/15 1140 12/11/15 0505  WBC 9.5 9.6 10.1  NEUTROABS 5.3  --   --   HGB 13.6 14.0 11.2*  HCT 41.6 42.0 33.6*  MCV 96.7 95.9 96.6  PLT 259 277 226   Assessment/Plan 1. Status post total replacement of right hip Patient is engaged in physical therapy and occupational therapy. She is up walking without assistance and using a walker. Pain control has been very good. She would like to reduce her narcotics.  2. Senile osteoporosis Stable  3. Reflux esophagitis Asymptomatic  4. Cough Occurred after having  a baby aspirin Stuckey backwards throat. Chest x-ray has been done and is reportedly normal. Cough has improved.  5. Acute blood loss anemia Mild. Follow-up CBC ordered for next week.

## 2015-12-20 LAB — CBC AND DIFFERENTIAL
HCT: 37 % (ref 36–46)
Hemoglobin: 12.1 g/dL (ref 12.0–16.0)
Platelets: 570 10*3/uL — AB (ref 150–399)
WBC: 10.4 10^3/mL

## 2015-12-21 ENCOUNTER — Other Ambulatory Visit: Payer: Self-pay | Admitting: *Deleted

## 2015-12-23 DIAGNOSIS — M1611 Unilateral primary osteoarthritis, right hip: Secondary | ICD-10-CM | POA: Diagnosis not present

## 2016-01-04 DIAGNOSIS — Z961 Presence of intraocular lens: Secondary | ICD-10-CM | POA: Diagnosis not present

## 2016-01-04 DIAGNOSIS — H40053 Ocular hypertension, bilateral: Secondary | ICD-10-CM | POA: Diagnosis not present

## 2016-01-05 DIAGNOSIS — M545 Low back pain: Secondary | ICD-10-CM | POA: Diagnosis not present

## 2016-01-05 DIAGNOSIS — M25561 Pain in right knee: Secondary | ICD-10-CM | POA: Diagnosis not present

## 2016-01-05 DIAGNOSIS — R293 Abnormal posture: Secondary | ICD-10-CM | POA: Diagnosis not present

## 2016-01-05 DIAGNOSIS — M79651 Pain in right thigh: Secondary | ICD-10-CM | POA: Diagnosis not present

## 2016-01-05 DIAGNOSIS — R262 Difficulty in walking, not elsewhere classified: Secondary | ICD-10-CM | POA: Diagnosis not present

## 2016-01-05 DIAGNOSIS — M25511 Pain in right shoulder: Secondary | ICD-10-CM | POA: Diagnosis not present

## 2016-01-05 DIAGNOSIS — M6281 Muscle weakness (generalized): Secondary | ICD-10-CM | POA: Diagnosis not present

## 2016-01-05 DIAGNOSIS — M25551 Pain in right hip: Secondary | ICD-10-CM | POA: Diagnosis not present

## 2016-01-07 DIAGNOSIS — M25551 Pain in right hip: Secondary | ICD-10-CM | POA: Diagnosis not present

## 2016-01-07 DIAGNOSIS — M79651 Pain in right thigh: Secondary | ICD-10-CM | POA: Diagnosis not present

## 2016-01-07 DIAGNOSIS — R293 Abnormal posture: Secondary | ICD-10-CM | POA: Diagnosis not present

## 2016-01-07 DIAGNOSIS — R262 Difficulty in walking, not elsewhere classified: Secondary | ICD-10-CM | POA: Diagnosis not present

## 2016-01-07 DIAGNOSIS — M6281 Muscle weakness (generalized): Secondary | ICD-10-CM | POA: Diagnosis not present

## 2016-01-07 DIAGNOSIS — M25561 Pain in right knee: Secondary | ICD-10-CM | POA: Diagnosis not present

## 2016-01-11 DIAGNOSIS — M25551 Pain in right hip: Secondary | ICD-10-CM | POA: Diagnosis not present

## 2016-01-11 DIAGNOSIS — M79651 Pain in right thigh: Secondary | ICD-10-CM | POA: Diagnosis not present

## 2016-01-11 DIAGNOSIS — R262 Difficulty in walking, not elsewhere classified: Secondary | ICD-10-CM | POA: Diagnosis not present

## 2016-01-11 DIAGNOSIS — M25561 Pain in right knee: Secondary | ICD-10-CM | POA: Diagnosis not present

## 2016-01-11 DIAGNOSIS — R293 Abnormal posture: Secondary | ICD-10-CM | POA: Diagnosis not present

## 2016-01-11 DIAGNOSIS — M6281 Muscle weakness (generalized): Secondary | ICD-10-CM | POA: Diagnosis not present

## 2016-01-13 DIAGNOSIS — M25551 Pain in right hip: Secondary | ICD-10-CM | POA: Diagnosis not present

## 2016-01-13 DIAGNOSIS — M25561 Pain in right knee: Secondary | ICD-10-CM | POA: Diagnosis not present

## 2016-01-13 DIAGNOSIS — M79651 Pain in right thigh: Secondary | ICD-10-CM | POA: Diagnosis not present

## 2016-01-13 DIAGNOSIS — R262 Difficulty in walking, not elsewhere classified: Secondary | ICD-10-CM | POA: Diagnosis not present

## 2016-01-13 DIAGNOSIS — M6281 Muscle weakness (generalized): Secondary | ICD-10-CM | POA: Diagnosis not present

## 2016-01-13 DIAGNOSIS — R293 Abnormal posture: Secondary | ICD-10-CM | POA: Diagnosis not present

## 2016-01-18 DIAGNOSIS — M6281 Muscle weakness (generalized): Secondary | ICD-10-CM | POA: Diagnosis not present

## 2016-01-18 DIAGNOSIS — R262 Difficulty in walking, not elsewhere classified: Secondary | ICD-10-CM | POA: Diagnosis not present

## 2016-01-18 DIAGNOSIS — M25551 Pain in right hip: Secondary | ICD-10-CM | POA: Diagnosis not present

## 2016-01-18 DIAGNOSIS — R293 Abnormal posture: Secondary | ICD-10-CM | POA: Diagnosis not present

## 2016-01-18 DIAGNOSIS — M79651 Pain in right thigh: Secondary | ICD-10-CM | POA: Diagnosis not present

## 2016-01-18 DIAGNOSIS — M25561 Pain in right knee: Secondary | ICD-10-CM | POA: Diagnosis not present

## 2016-01-26 DIAGNOSIS — M6281 Muscle weakness (generalized): Secondary | ICD-10-CM | POA: Diagnosis not present

## 2016-01-26 DIAGNOSIS — M79651 Pain in right thigh: Secondary | ICD-10-CM | POA: Diagnosis not present

## 2016-01-26 DIAGNOSIS — M25551 Pain in right hip: Secondary | ICD-10-CM | POA: Diagnosis not present

## 2016-01-26 DIAGNOSIS — R293 Abnormal posture: Secondary | ICD-10-CM | POA: Diagnosis not present

## 2016-01-26 DIAGNOSIS — R262 Difficulty in walking, not elsewhere classified: Secondary | ICD-10-CM | POA: Diagnosis not present

## 2016-01-26 DIAGNOSIS — M25561 Pain in right knee: Secondary | ICD-10-CM | POA: Diagnosis not present

## 2016-01-31 DIAGNOSIS — R293 Abnormal posture: Secondary | ICD-10-CM | POA: Diagnosis not present

## 2016-01-31 DIAGNOSIS — R262 Difficulty in walking, not elsewhere classified: Secondary | ICD-10-CM | POA: Diagnosis not present

## 2016-01-31 DIAGNOSIS — M25511 Pain in right shoulder: Secondary | ICD-10-CM | POA: Diagnosis not present

## 2016-01-31 DIAGNOSIS — M25561 Pain in right knee: Secondary | ICD-10-CM | POA: Diagnosis not present

## 2016-01-31 DIAGNOSIS — M25551 Pain in right hip: Secondary | ICD-10-CM | POA: Diagnosis not present

## 2016-01-31 DIAGNOSIS — M6281 Muscle weakness (generalized): Secondary | ICD-10-CM | POA: Diagnosis not present

## 2016-01-31 DIAGNOSIS — R41841 Cognitive communication deficit: Secondary | ICD-10-CM | POA: Diagnosis not present

## 2016-01-31 DIAGNOSIS — M545 Low back pain: Secondary | ICD-10-CM | POA: Diagnosis not present

## 2016-01-31 DIAGNOSIS — M79651 Pain in right thigh: Secondary | ICD-10-CM | POA: Diagnosis not present

## 2016-02-01 DIAGNOSIS — R293 Abnormal posture: Secondary | ICD-10-CM | POA: Diagnosis not present

## 2016-02-01 DIAGNOSIS — R262 Difficulty in walking, not elsewhere classified: Secondary | ICD-10-CM | POA: Diagnosis not present

## 2016-02-01 DIAGNOSIS — M6281 Muscle weakness (generalized): Secondary | ICD-10-CM | POA: Diagnosis not present

## 2016-02-01 DIAGNOSIS — R41841 Cognitive communication deficit: Secondary | ICD-10-CM | POA: Diagnosis not present

## 2016-02-01 DIAGNOSIS — M25551 Pain in right hip: Secondary | ICD-10-CM | POA: Diagnosis not present

## 2016-02-01 DIAGNOSIS — M25561 Pain in right knee: Secondary | ICD-10-CM | POA: Diagnosis not present

## 2016-02-02 DIAGNOSIS — R293 Abnormal posture: Secondary | ICD-10-CM | POA: Diagnosis not present

## 2016-02-02 DIAGNOSIS — R41841 Cognitive communication deficit: Secondary | ICD-10-CM | POA: Diagnosis not present

## 2016-02-02 DIAGNOSIS — M25551 Pain in right hip: Secondary | ICD-10-CM | POA: Diagnosis not present

## 2016-02-02 DIAGNOSIS — M25561 Pain in right knee: Secondary | ICD-10-CM | POA: Diagnosis not present

## 2016-02-02 DIAGNOSIS — M6281 Muscle weakness (generalized): Secondary | ICD-10-CM | POA: Diagnosis not present

## 2016-02-02 DIAGNOSIS — R262 Difficulty in walking, not elsewhere classified: Secondary | ICD-10-CM | POA: Diagnosis not present

## 2016-02-08 DIAGNOSIS — R41841 Cognitive communication deficit: Secondary | ICD-10-CM | POA: Diagnosis not present

## 2016-02-08 DIAGNOSIS — M25561 Pain in right knee: Secondary | ICD-10-CM | POA: Diagnosis not present

## 2016-02-08 DIAGNOSIS — M6281 Muscle weakness (generalized): Secondary | ICD-10-CM | POA: Diagnosis not present

## 2016-02-08 DIAGNOSIS — R262 Difficulty in walking, not elsewhere classified: Secondary | ICD-10-CM | POA: Diagnosis not present

## 2016-02-08 DIAGNOSIS — M25551 Pain in right hip: Secondary | ICD-10-CM | POA: Diagnosis not present

## 2016-02-08 DIAGNOSIS — R293 Abnormal posture: Secondary | ICD-10-CM | POA: Diagnosis not present

## 2016-02-10 DIAGNOSIS — Z23 Encounter for immunization: Secondary | ICD-10-CM | POA: Diagnosis not present

## 2016-02-11 DIAGNOSIS — R262 Difficulty in walking, not elsewhere classified: Secondary | ICD-10-CM | POA: Diagnosis not present

## 2016-02-11 DIAGNOSIS — M25551 Pain in right hip: Secondary | ICD-10-CM | POA: Diagnosis not present

## 2016-02-11 DIAGNOSIS — M25561 Pain in right knee: Secondary | ICD-10-CM | POA: Diagnosis not present

## 2016-02-11 DIAGNOSIS — R293 Abnormal posture: Secondary | ICD-10-CM | POA: Diagnosis not present

## 2016-02-11 DIAGNOSIS — R41841 Cognitive communication deficit: Secondary | ICD-10-CM | POA: Diagnosis not present

## 2016-02-11 DIAGNOSIS — M6281 Muscle weakness (generalized): Secondary | ICD-10-CM | POA: Diagnosis not present

## 2016-02-15 DIAGNOSIS — M25551 Pain in right hip: Secondary | ICD-10-CM | POA: Diagnosis not present

## 2016-02-15 DIAGNOSIS — R293 Abnormal posture: Secondary | ICD-10-CM | POA: Diagnosis not present

## 2016-02-15 DIAGNOSIS — R41841 Cognitive communication deficit: Secondary | ICD-10-CM | POA: Diagnosis not present

## 2016-02-15 DIAGNOSIS — M25561 Pain in right knee: Secondary | ICD-10-CM | POA: Diagnosis not present

## 2016-02-15 DIAGNOSIS — R262 Difficulty in walking, not elsewhere classified: Secondary | ICD-10-CM | POA: Diagnosis not present

## 2016-02-15 DIAGNOSIS — M6281 Muscle weakness (generalized): Secondary | ICD-10-CM | POA: Diagnosis not present

## 2016-02-16 ENCOUNTER — Ambulatory Visit (INDEPENDENT_AMBULATORY_CARE_PROVIDER_SITE_OTHER): Payer: Medicare Other | Admitting: Orthopaedic Surgery

## 2016-02-16 DIAGNOSIS — M1611 Unilateral primary osteoarthritis, right hip: Secondary | ICD-10-CM

## 2016-02-16 DIAGNOSIS — M25551 Pain in right hip: Secondary | ICD-10-CM

## 2016-02-18 DIAGNOSIS — H40053 Ocular hypertension, bilateral: Secondary | ICD-10-CM | POA: Diagnosis not present

## 2016-02-18 DIAGNOSIS — Z961 Presence of intraocular lens: Secondary | ICD-10-CM | POA: Diagnosis not present

## 2016-02-21 DIAGNOSIS — R293 Abnormal posture: Secondary | ICD-10-CM | POA: Diagnosis not present

## 2016-02-21 DIAGNOSIS — R262 Difficulty in walking, not elsewhere classified: Secondary | ICD-10-CM | POA: Diagnosis not present

## 2016-02-21 DIAGNOSIS — M25561 Pain in right knee: Secondary | ICD-10-CM | POA: Diagnosis not present

## 2016-02-21 DIAGNOSIS — M6281 Muscle weakness (generalized): Secondary | ICD-10-CM | POA: Diagnosis not present

## 2016-02-21 DIAGNOSIS — M25551 Pain in right hip: Secondary | ICD-10-CM | POA: Diagnosis not present

## 2016-02-21 DIAGNOSIS — R41841 Cognitive communication deficit: Secondary | ICD-10-CM | POA: Diagnosis not present

## 2016-04-17 ENCOUNTER — Ambulatory Visit (INDEPENDENT_AMBULATORY_CARE_PROVIDER_SITE_OTHER): Payer: Medicare Other | Admitting: Internal Medicine

## 2016-04-17 ENCOUNTER — Encounter: Payer: Self-pay | Admitting: Internal Medicine

## 2016-04-17 VITALS — BP 158/80 | HR 89 | Temp 98.1°F | Wt 133.0 lb

## 2016-04-17 DIAGNOSIS — D62 Acute posthemorrhagic anemia: Secondary | ICD-10-CM | POA: Diagnosis not present

## 2016-04-17 DIAGNOSIS — F4323 Adjustment disorder with mixed anxiety and depressed mood: Secondary | ICD-10-CM | POA: Diagnosis not present

## 2016-04-17 DIAGNOSIS — L509 Urticaria, unspecified: Secondary | ICD-10-CM | POA: Diagnosis not present

## 2016-04-17 DIAGNOSIS — R2681 Unsteadiness on feet: Secondary | ICD-10-CM

## 2016-04-17 DIAGNOSIS — Z96641 Presence of right artificial hip joint: Secondary | ICD-10-CM

## 2016-04-17 DIAGNOSIS — R300 Dysuria: Secondary | ICD-10-CM

## 2016-04-17 LAB — CBC WITH DIFFERENTIAL/PLATELET
Basophils Absolute: 0 cells/uL (ref 0–200)
Basophils Relative: 0 %
Eosinophils Absolute: 330 cells/uL (ref 15–500)
Eosinophils Relative: 3 %
HCT: 42.4 % (ref 35.0–45.0)
Hemoglobin: 13.9 g/dL (ref 11.7–15.5)
Lymphocytes Relative: 25 %
Lymphs Abs: 2750 cells/uL (ref 850–3900)
MCH: 31.2 pg (ref 27.0–33.0)
MCHC: 32.8 g/dL (ref 32.0–36.0)
MCV: 95.3 fL (ref 80.0–100.0)
MPV: 11.4 fL (ref 7.5–12.5)
Monocytes Absolute: 880 cells/uL (ref 200–950)
Monocytes Relative: 8 %
Neutro Abs: 7040 cells/uL (ref 1500–7800)
Neutrophils Relative %: 64 %
Platelets: 279 10*3/uL (ref 140–400)
RBC: 4.45 MIL/uL (ref 3.80–5.10)
RDW: 14.5 % (ref 11.0–15.0)
WBC: 11 10*3/uL — ABNORMAL HIGH (ref 3.8–10.8)

## 2016-04-17 LAB — POCT URINALYSIS DIPSTICK
Bilirubin, UA: NEGATIVE
Glucose, UA: NEGATIVE
Ketones, UA: NEGATIVE
Nitrite, UA: NEGATIVE
Protein, UA: NEGATIVE
Spec Grav, UA: 1.01
Urobilinogen, UA: NEGATIVE
pH, UA: 6

## 2016-04-17 LAB — BASIC METABOLIC PANEL
BUN: 20 mg/dL (ref 7–25)
CO2: 27 mmol/L (ref 20–31)
Calcium: 10.3 mg/dL (ref 8.6–10.4)
Chloride: 102 mmol/L (ref 98–110)
Creat: 0.83 mg/dL (ref 0.60–0.88)
Glucose, Bld: 86 mg/dL (ref 65–99)
Potassium: 4.6 mmol/L (ref 3.5–5.3)
Sodium: 137 mmol/L (ref 135–146)

## 2016-04-17 NOTE — Patient Instructions (Addendum)
Drink plenty of fluids--more water than usual.   It does not look like you have a UTI.   We'll check your blood counts today.

## 2016-04-17 NOTE — Progress Notes (Signed)
Location:  [facility] clinic Provider:  Jamielee Mchale L. Mariea Clonts, D.O., C.M.D.  Code Status: DNR Goals of Care: Advanced Directives 04/17/2016  Does Patient Have a Medical Advance Directive? Yes  Type of Paramedic of [place];Out of facility DNR (pink MOST or yellow form)  Does patient want to make changes to medical advance directive? -  Copy of [place] in Chart? Yes  Would patient like information on creating a medical advance directive? -  Pre-existing out of facility DNR order (yellow form or pink MOST form) Yellow form placed in chart (order not valid for inpatient use)     Chief Complaint  Patient presents with  . Medical Management of Chronic Issues    40mth follow-up    HPI: Patient is a 82 y.o. female seen today for medical management of chronic diseases.    Felt similar to this and went to ED on 70 in April and felt wobbly like this--wound up having UTI.    She did not use the prednisone they gave her then. No fever, abdominal pain today.  She has been taking this prednisone for the rash (see below).  She quit the prednisone Friday and has felt funny and wobbly like before.  She did have urinary frequency yesterday vs. Normal.  She wears her pessary so she feared she may not be able to do the sample but was successful.  No fever.  No abdominal pain.    Her kids went to [place] and she was here "alone" and watching their cat each am.  She was going at 7am.  She lost her key to her apt which made her anxious.  A few days later, she went out to start her car and had to call someone to get her a new battery.  Also anxiety-provoking. There was a big black truck in the driveway until the day before they were returning.  She went over the top with anxiety.  She got a rash on her back.  She used benadryl liquigels, itch cream, allergy relief, cetirizine hcl, hydrocortisone cream and nothing worked.    A year or so ago, she was taking care of her friend's  garden.  She got stung by a wasp--there she was given prednisone 2 a day for 2 days.  When she went off the prednisone, she felt awful afterwards.    Her new hip is wonderful.   Done by Dr. Ninfa Linden. She is still going her exercises on the machines.   She recovered beautifully in health care at [facility].  Some numbness and pain.  She had prior scar tissue posteriorly that bothers her some.    She is very lightheaded and anxious.  BP was 158/80.    Past Medical History:  Diagnosis Date  . Allergic rhinitis due to pollen   . Anxiety state, unspecified   . Arthritis    RIGHT LEG, HANDS.  [name] Cancer (HCC)    FACE/NOSE -SKIN-squamous cell and basal cell.  . Disorder of bone and cartilage, unspecified   . Lumbago   . Lyme borreliosis   . Other atopic dermatitis and related conditions   . Other dysphagia   . Reflux esophagitis   . Senile osteoporosis   . TMJ dysfunction    MORE ON LEFT "WEARS BITE PROTECTION AT NIGHT"    Past Surgical History:  Procedure Laterality Date  . APPENDECTOMY  1950  . CATARACT EXTRACTION, BILATERAL Bilateral   . EYE SURGERY Right    right  eye ptrigium excision x2  . HERNIA REPAIR Bilateral   . TOTAL HIP ARTHROPLASTY Right 12/10/2015   Procedure: RIGHT TOTAL HIP ARTHROPLASTY ANTERIOR APPROACH;  Surgeon: [name], MD;  Location: [facility];  Service: Orthopedics;  Laterality: Right;  Spinal to General    Allergies  Allergen Reactions  . Brandy [Alcohol] Anaphylaxis    Related to grapes  . Prune Anaphylaxis    PLUMS,GRAPES,RAISINS  . Aspirin Other (See Comments)    Stomach bleed  . Augmentin [Amoxicillin-Pot Clavulanate] Hives and Itching  . Clindamycin Other (See Comments)    unkown  . Omnicef [Cefdinir] Hives  . Vinegar [Acetic Acid]     basalmic  . Alprazolam Rash  . Ceftin [Cefuroxime Axetil] Rash  . Doxycycline Rash  . Eryc [Erythromycin] Rash  . Penicillins Rash    Allergies as of 04/17/2016      Reactions   Brandy [alcohol]  Anaphylaxis   Related to grapes   Prune Anaphylaxis   PLUMS,GRAPES,RAISINS   Aspirin Other (See Comments)   Stomach bleed   Augmentin [amoxicillin-pot Clavulanate] Hives, Itching   Clindamycin Other (See Comments)   unkown   Omnicef [cefdinir] Hives   Vinegar [acetic Acid]    basalmic   Alprazolam Rash   Ceftin [cefuroxime Axetil] Rash   Doxycycline Rash   Eryc [erythromycin] Rash   Penicillins Rash      Medication List       Accurate as of 04/17/16 10:39 AM. Always use your most recent med list.          acetaminophen 325 MG tablet Commonly known as:  TYLENOL Take 650 mg by mouth every 6 (six) hours as needed for mild pain.   cholecalciferol 1000 units tablet Commonly known as:  VITAMIN D Take 1,000 Units by mouth daily.   EPINEPHrine 0.3 mg/0.3 mL Soaj injection Commonly known as:  EPIPEN 2-PAK Inject 0.3 mLs (0.3 mg total) into the muscle once. As needed for allergic reaction   Garlic 123XX123 MG Tabs Take by mouth daily.       Review of Systems:  Review of Systems  Constitutional: Positive for malaise/fatigue. Negative for chills and fever.  HENT: Positive for hearing loss. Negative for congestion.   Eyes: Negative for blurred vision.       Glasses  Respiratory: Negative for cough and shortness of breath.   Cardiovascular: Negative for chest pain, palpitations and leg swelling.       Bp up today  Gastrointestinal: Negative for abdominal pain, blood in stool, constipation and melena.  Genitourinary: Negative for dysuria, frequency and urgency.  Musculoskeletal: Negative for falls.       Hip pain much better  Skin: Negative for itching and rash.  Neurological: Positive for weakness. Negative for dizziness and loss of consciousness.  Endo/Heme/Allergies: Does not bruise/bleed easily.  Psychiatric/Behavioral: Negative for depression and memory loss. The patient is nervous/anxious.     Health Maintenance  Topic Date Due  . MAMMOGRAM  10/27/2015  .  TETANUS/TDAP  05/03/2025  . INFLUENZA VACCINE  Completed  . DEXA SCAN  Completed  . ZOSTAVAX  Completed  . PNA vac Low Risk Adult  Completed    Physical Exam: Vitals:   04/17/16 0949  BP: (!) 158/80  Pulse: 89  Temp: 98.1 F (36.7 C)  TempSrc: Oral  SpO2: 94%  Weight: 133 lb (60.3 kg)   Body mass index is 23.56 kg/m. Physical Exam  Constitutional: She is oriented to person, place, and time. She appears well-developed  and well-nourished.  Cardiovascular: Normal rate, regular rhythm, normal heart sounds and intact distal pulses.   Pulmonary/Chest: Effort normal and breath sounds normal. No respiratory distress. She has no wheezes. She has no rales.  Abdominal: Soft. Bowel sounds are normal.  Musculoskeletal: Normal range of motion.  Neurological: She is alert and oriented to person, place, and time. No cranial nerve deficit.  Skin: Skin is warm and dry. Capillary refill takes less than 2 seconds.  Erythematous papules resolving on upper back; evidence of excoriation present  Psychiatric: She has a normal mood and affect.    Labs reviewed: Basic Metabolic Panel:  Recent Labs  08/08/15 1440 11/30/15 1140 12/11/15 0505  NA 138 137 135  K 4.2 4.7 4.0  CL 105 105 104  CO2 27 26 26   GLUCOSE 98 93 125*  BUN 21* 19 10  CREATININE 0.76 0.65 0.63  CALCIUM 10.0 10.0 9.1   Liver Function Tests: No results for input(s): AST, ALT, ALKPHOS, BILITOT, PROT, ALBUMIN in the last 8760 hours. No results for input(s): LIPASE, AMYLASE in the last 8760 hours. No results for input(s): AMMONIA in the last 8760 hours. CBC:  Recent Labs  08/08/15 1440 11/30/15 1140 12/11/15 0505 12/20/15  WBC 9.5 9.6 10.1 10.4  NEUTROABS 5.3  --   --   --   HGB 13.6 14.0 11.2* 12.1  HCT 41.6 42.0 33.6* 37  MCV 96.7 95.9 96.6  --   PLT 259 277 226 570*   Lipid Panel:  Recent Labs  10/15/15 0948  CHOL 242*  HDL 99  LDLCALC 124*  TRIG 96  CHOLHDL 2.4   Reviewed notes from hospitalization  and rehab post hip replacement  Assessment/Plan 1. Dysuria -not actually having dysuria, is only having unsteadiness and lightheadedness, anxious feeling only - POC Urinalysis Dipstick only showed increased wbcs, but no nitrites - Urine culture - Basic metabolic panel  2. Unsteady gait - feels unsteady just this am - check labs for hydration and anemia: - Basic metabolic panel  3. Status post total replacement of right hip -doing wonderfully, pain much better and back to her exercise  4. Adjustment disorder with mixed anxiety and depressed mood -anxiety is severe right now since her family went out of town and took prednisone for her rash -encouraged hydration and rest  5. Postoperative anemia due to acute blood loss - f/u labs as I don't see an h/h since the immediate postop one - CBC with Differential/Platelet  6. Hives -due to citrus???--unclear--pt has a lot of food allergy problems  Labs/tests ordered:   Orders Placed This Encounter  Procedures  . Urine culture    Standing Status:   Future    Number of Occurrences:   1    Standing Expiration Date:   04/17/2017  . CBC with Differential/Platelet  . Basic metabolic panel  . POC Urinalysis Dipstick    Next appt:  6 mos for AWV, med mgt   Aili Casillas L. Antaniya Venuti, D.O. [facility] [address], [place] [address] Cell Phone (Mon-Fri 8am-5pm):  [phone] On Call:  [phone] & follow prompts after 5pm & weekends Office Phone:  [phone] Office Fax:  [phone]

## 2016-04-18 LAB — URINE CULTURE

## 2016-04-28 ENCOUNTER — Other Ambulatory Visit: Payer: Self-pay | Admitting: Internal Medicine

## 2016-04-28 DIAGNOSIS — D62 Acute posthemorrhagic anemia: Secondary | ICD-10-CM

## 2016-04-28 DIAGNOSIS — E785 Hyperlipidemia, unspecified: Secondary | ICD-10-CM

## 2016-04-28 DIAGNOSIS — Z0000 Encounter for general adult medical examination without abnormal findings: Secondary | ICD-10-CM

## 2016-06-06 ENCOUNTER — Encounter: Payer: Self-pay | Admitting: Women's Health

## 2016-06-06 ENCOUNTER — Ambulatory Visit (INDEPENDENT_AMBULATORY_CARE_PROVIDER_SITE_OTHER): Payer: Medicare Other | Admitting: Women's Health

## 2016-06-06 VITALS — BP 138/86

## 2016-06-06 DIAGNOSIS — N390 Urinary tract infection, site not specified: Secondary | ICD-10-CM | POA: Diagnosis not present

## 2016-06-06 LAB — URINALYSIS W MICROSCOPIC + REFLEX CULTURE
BILIRUBIN URINE: NEGATIVE
CASTS: NONE SEEN [LPF]
CRYSTALS: NONE SEEN [HPF]
Glucose, UA: NEGATIVE
Ketones, ur: NEGATIVE
Nitrite: NEGATIVE
PROTEIN: NEGATIVE
Specific Gravity, Urine: 1.02 (ref 1.001–1.035)
Yeast: NONE SEEN [HPF]
pH: 5.5 (ref 5.0–8.0)

## 2016-06-06 MED ORDER — SULFAMETHOXAZOLE-TRIMETHOPRIM 800-160 MG PO TABS: 1.0000 | ORAL_TABLET | Freq: Two times a day (BID) | ORAL | 0 refills | Status: DC

## 2016-06-06 NOTE — Patient Instructions (Signed)

## 2016-06-06 NOTE — Progress Notes (Signed)
Presents with complaint of questionable UTI, feels slightly lightheaded, not clear thinking, mild irritation slight burning with urination. Postmenopausal on no HRT with no bleeding. Has a ring pessary with support that she normally cares for herself. Had a hip replacement this past year and has done well with physical therapy and getting back to her normal routine. Denies abdominal pain, fever, vaginal discharge. Lives at [facility] [place].  Exam: Appears well, appropriately dressed, alert and oriented. No CVAT. External genitalia within normal limits, ring pessary removed, washed, speculum exam no erosion, moving with support pessary replaced with ease. UA: Trace blood, +2 leukocytes, 40-60 wbc's, 0-2 RBCs, many bacteria.  UTI Pessary maintenance  Plan: Septra twice daily for 3 days prescription, proper use given and reviewed, instructed to call if no relief of symptoms. UTI prevention discussed.

## 2016-06-07 LAB — URINE CULTURE: ORGANISM ID, BACTERIA: NO GROWTH

## 2016-08-02 ENCOUNTER — Encounter: Payer: Self-pay | Admitting: Women's Health

## 2016-08-02 ENCOUNTER — Ambulatory Visit (INDEPENDENT_AMBULATORY_CARE_PROVIDER_SITE_OTHER): Payer: Medicare Other | Admitting: Women's Health

## 2016-08-02 VITALS — BP 124/78

## 2016-08-02 DIAGNOSIS — N3000 Acute cystitis without hematuria: Secondary | ICD-10-CM | POA: Diagnosis not present

## 2016-08-02 DIAGNOSIS — R300 Dysuria: Secondary | ICD-10-CM

## 2016-08-02 DIAGNOSIS — L298 Other pruritus: Secondary | ICD-10-CM

## 2016-08-02 DIAGNOSIS — N898 Other specified noninflammatory disorders of vagina: Secondary | ICD-10-CM

## 2016-08-02 LAB — URINALYSIS W MICROSCOPIC + REFLEX CULTURE
Bilirubin Urine: NEGATIVE
CASTS: NONE SEEN [LPF]
CRYSTALS: NONE SEEN [HPF]
Glucose, UA: NEGATIVE
Ketones, ur: NEGATIVE
NITRITE: NEGATIVE
PROTEIN: NEGATIVE
SPECIFIC GRAVITY, URINE: 1.015 (ref 1.001–1.035)
WBC, UA: >60 WBC/HPF — AB (ref <=5)
YEAST: NONE SEEN [HPF]
pH: 5.5 (ref 5.0–8.0)

## 2016-08-02 LAB — WET PREP FOR TRICH, YEAST, CLUE
CLUE CELLS WET PREP: NONE SEEN
TRICH WET PREP: NONE SEEN
YEAST WET PREP: NONE SEEN

## 2016-08-02 MED ORDER — CIPROFLOXACIN HCL 250 MG PO TABS: 250.0000 mg | ORAL_TABLET | Freq: Two times a day (BID) | ORAL | 0 refills | Status: DC

## 2016-08-02 NOTE — Patient Instructions (Signed)

## 2016-08-02 NOTE — Progress Notes (Signed)
HPI: Presents with complaint of possible UTI. Felt light-headed, dizzy, and had slight burning and itching with urination starting 5 days ago. Postmenopausal on no HRT with no bleeding. Has a ring pessary with support. Lives at [facility] in independent apartment.   Exam: Appears well, in no acute distress. Pessary removed, washed, and left out. Speculum exam normal with no erosion. Minimal erythema, no visible discharge, wet prep negative.  UA: Trace blood, 3+ leukocytes, >60 wbc's, 0-2 rbc's, many bacteria.  Recurrent UTI Pessary maintenance   Plan: Urine culture pending. Cipro 250 mg twice a day for 5 days. Topical estrogen cream compounded .02% apply externally 2-3 times weekly. Keep pessary ring out for now.. Instructed to call if no relief of symptoms. UTI prevention discussed

## 2016-08-03 LAB — URINE CULTURE: ORGANISM ID, BACTERIA: NO GROWTH

## 2016-09-13 ENCOUNTER — Encounter: Payer: Self-pay | Admitting: Gynecology

## 2016-09-22 ENCOUNTER — Encounter: Payer: Self-pay | Admitting: Gynecology

## 2016-09-22 ENCOUNTER — Other Ambulatory Visit: Payer: Self-pay | Admitting: Gynecology

## 2016-09-22 ENCOUNTER — Ambulatory Visit (INDEPENDENT_AMBULATORY_CARE_PROVIDER_SITE_OTHER): Payer: Medicare Other | Admitting: Gynecology

## 2016-09-22 VITALS — BP 122/80 | Ht 63.0 in | Wt 135.0 lb

## 2016-09-22 DIAGNOSIS — N898 Other specified noninflammatory disorders of vagina: Secondary | ICD-10-CM

## 2016-09-22 DIAGNOSIS — N3000 Acute cystitis without hematuria: Secondary | ICD-10-CM

## 2016-09-22 DIAGNOSIS — L292 Pruritus vulvae: Secondary | ICD-10-CM

## 2016-09-22 DIAGNOSIS — B3731 Acute candidiasis of vulva and vagina: Secondary | ICD-10-CM

## 2016-09-22 DIAGNOSIS — R300 Dysuria: Secondary | ICD-10-CM | POA: Diagnosis not present

## 2016-09-22 DIAGNOSIS — B373 Candidiasis of vulva and vagina: Secondary | ICD-10-CM

## 2016-09-22 LAB — URINALYSIS W MICROSCOPIC + REFLEX CULTURE
BILIRUBIN URINE: NEGATIVE
CASTS: NONE SEEN [LPF]
Crystals: NONE SEEN [HPF]
Glucose, UA: NEGATIVE
HGB URINE DIPSTICK: NEGATIVE
Ketones, ur: NEGATIVE
Nitrite: NEGATIVE
PROTEIN: NEGATIVE
RBC / HPF: NONE SEEN RBC/HPF (ref <=2)
Specific Gravity, Urine: 1.015 (ref 1.001–1.035)
Yeast: NONE SEEN [HPF]
pH: 6 (ref 5.0–8.0)

## 2016-09-22 LAB — WET PREP FOR TRICH, YEAST, CLUE
Clue Cells Wet Prep HPF POC: NONE SEEN
Trich, Wet Prep: NONE SEEN

## 2016-09-22 MED ORDER — NITROFURANTOIN MONOHYD MACRO 100 MG PO CAPS: 100.0000 mg | ORAL_CAPSULE | Freq: Two times a day (BID) | ORAL | 0 refills | Status: DC

## 2016-09-22 MED ORDER — PHENAZOPYRIDINE HCL 200 MG PO TABS: 200.0000 mg | ORAL_TABLET | Freq: Three times a day (TID) | ORAL | 0 refills | Status: DC | PRN

## 2016-09-22 MED ORDER — FLUCONAZOLE 150 MG PO TABS: 150.0000 mg | ORAL_TABLET | Freq: Once | ORAL | 0 refills | Status: AC

## 2016-09-22 NOTE — Addendum Note (Signed)
Addended by: Burnett Kanaris on: 09/22/2016 12:19 PM   Modules accepted: Orders

## 2016-09-22 NOTE — Patient Instructions (Signed)
Phenazopyridine tablets What is this medicine? PHENAZOPYRIDINE (fen az oh PEER i deen) is a pain reliever. It is used to stop the pain, burning, or discomfort caused by infection or irritation of the urinary tract. This medicine is not an antibiotic. It will not cure a urinary tract infection. This medicine may be used for other purposes; ask your health care provider or pharmacist if you have questions. COMMON BRAND NAME(S): AZO, Azo-100, Azo-Gesic, Azo-Septic, Azo-Standard, Phenazo, Prodium, Pyridium, Urinary Analgesic, Uristat What should I tell my health care provider before I take this medicine? They need to know if you have any of these conditions: -glucose-6-phosphate dehydrogenase (G6PD) deficiency -kidney disease -an unusual or allergic reaction to phenazopyridine, other medicines, foods, dyes, or preservatives -pregnant or trying to get pregnant -breast-feeding How should I use this medicine? Take this medicine by mouth with a glass of water. Follow the directions on the prescription label. Take after meals. Take your doses at regular intervals. Do not take your medicine more often than directed. Do not skip doses or stop your medicine early even if you feel better. Do not stop taking except on your doctor's advice. Talk to your pediatrician regarding the use of this medicine in children. Special care may be needed. Overdosage: If you think you have taken too much of this medicine contact a poison control center or emergency room at once. NOTE: This medicine is only for you. Do not share this medicine with others. What if I miss a dose? If you miss a dose, take it as soon as you can. If it is almost time for your next dose, take only that dose. Do not take double or extra doses. What may interact with this medicine? Interactions are not expected. This list may not describe all possible interactions. Give your health care provider a list of all the medicines, herbs, non-prescription  drugs, or dietary supplements you use. Also tell them if you smoke, drink alcohol, or use illegal drugs. Some items may interact with your medicine. What should I watch for while using this medicine? Tell your doctor or health care professional if your symptoms do not improve or if they get worse. This medicine colors body fluids red. This effect is harmless and will go away after you are done taking the medicine. It will change urine to an dark orange or red color. The red color may stain clothing. Soft contact lenses may become permanently stained. It is best not to wear soft contact lenses while taking this medicine. If you are diabetic you may get a false positive result for sugar in your urine. Talk to your health care provider. What side effects may I notice from receiving this medicine? Side effects that you should report to your doctor or health care professional as soon as possible: -allergic reactions like skin rash, itching or hives, swelling of the face, lips, or tongue -blue or purple color of the skin -difficulty breathing -fever -less urine -unusual bleeding, bruising -unusual tired, weak -vomiting -yellowing of the eyes or skin Side effects that usually do not require medical attention (report to your doctor or health care professional if they continue or are bothersome): -dark urine -headache -stomach upset This list may not describe all possible side effects. Call your doctor for medical advice about side effects. You may report side effects to FDA at 1-800-FDA-1088. Where should I keep my medicine? Keep out of the reach of children. Store at room temperature between 15 and 30 degrees C (59 and 86   degrees F). Protect from light and moisture. Throw away any unused medicine after the expiration date. NOTE: This sheet is a summary. It may not cover all possible information. If you have questions about this medicine, talk to your doctor, pharmacist, or health care provider.   2018 Elsevier/Gold Standard (2007-11-14 11:04:07) Nitrofurantoin tablets or capsules What is this medicine? NITROFURANTOIN (nye troe fyoor AN toyn) is an antibiotic. It is used to treat urinary tract infections. This medicine may be used for other purposes; ask your health care provider or pharmacist if you have questions. COMMON BRAND NAME(S): Macrobid, Macrodantin, Urotoin What should I tell my health care provider before I take this medicine? They need to know if you have any of these conditions: -anemia -diabetes -glucose-6-phosphate dehydrogenase deficiency -kidney disease -liver disease -lung disease -other chronic illness -an unusual or allergic reaction to nitrofurantoin, other antibiotics, other medicines, foods, dyes or preservatives -pregnant or trying to get pregnant -breast-feeding How should I use this medicine? Take this medicine by mouth with a glass of water. Follow the directions on the prescription label. Take this medicine with food or milk. Take your doses at regular intervals. Do not take your medicine more often than directed. Do not stop taking except on your doctor's advice. Talk to your pediatrician regarding the use of this medicine in children. While this drug may be prescribed for selected conditions, precautions do apply. Overdosage: If you think you have taken too much of this medicine contact a poison control center or emergency room at once. NOTE: This medicine is only for you. Do not share this medicine with others. What if I miss a dose? If you miss a dose, take it as soon as you can. If it is almost time for your next dose, take only that dose. Do not take double or extra doses. What may interact with this medicine? -antacids containing magnesium trisilicate -probenecid -quinolone antibiotics like ciprofloxacin, lomefloxacin, norfloxacin and ofloxacin -sulfinpyrazone This list may not describe all possible interactions. Give your health care provider  a list of all the medicines, herbs, non-prescription drugs, or dietary supplements you use. Also tell them if you smoke, drink alcohol, or use illegal drugs. Some items may interact with your medicine. What should I watch for while using this medicine? Tell your doctor or health care professional if your symptoms do not improve or if you get new symptoms. Drink several glasses of water a day. If you are taking this medicine for a long time, visit your doctor for regular checks on your progress. If you are diabetic, you may get a false positive result for sugar in your urine with certain brands of urine tests. Check with your doctor. What side effects may I notice from receiving this medicine? Side effects that you should report to your doctor or health care professional as soon as possible: -allergic reactions like skin rash or hives, swelling of the face, lips, or tongue -chest pain -cough -difficulty breathing -dizziness, drowsiness -fever or infection -joint aches or pains -pale or blue-tinted skin -redness, blistering, peeling or loosening of the skin, including inside the mouth -tingling, burning, pain, or numbness in hands or feet -unusual bleeding or bruising -unusually weak or tired -yellowing of eyes or skin Side effects that usually do not require medical attention (report to your doctor or health care professional if they continue or are bothersome): -dark urine -diarrhea -headache -loss of appetite -nausea or vomiting -temporary hair loss This list may not describe all possible side effects. Call  your doctor for medical advice about side effects. You may report side effects to FDA at 1-800-FDA-1088. Where should I keep my medicine? Keep out of the reach of children. Store at room temperature between 15 and 30 degrees C (59 and 86 degrees F). Protect from light. Throw away any unused medicine after the expiration date. NOTE: This sheet is a summary. It may not cover all  possible information. If you have questions about this medicine, talk to your doctor, pharmacist, or health care provider.  2018 Elsevier/Gold Standard (2007-11-06 15:56:47) Urinary Tract Infection, Adult A urinary tract infection (UTI) is an infection of any part of the urinary tract, which includes the kidneys, ureters, bladder, and urethra. These organs make, store, and get rid of urine in the body. UTI can be a bladder infection (cystitis) or kidney infection (pyelonephritis). What are the causes? This infection may be caused by fungi, viruses, or bacteria. Bacteria are the most common cause of UTIs. This condition can also be caused by repeated incomplete emptying of the bladder during urination. What increases the risk? This condition is more likely to develop if:  You ignore your need to urinate or hold urine for long periods of time.  You do not empty your bladder completely during urination.  You wipe back to front after urinating or having a bowel movement, if you are female.  You are uncircumcised, if you are female.  You are constipated.  You have a urinary catheter that stays in place (indwelling).  You have a weak defense (immune) system.  You have a medical condition that affects your bowels, kidneys, or bladder.  You have diabetes.  You take antibiotic medicines frequently or for long periods of time, and the antibiotics no longer work well against certain types of infections (antibiotic resistance).  You take medicines that irritate your urinary tract.  You are exposed to chemicals that irritate your urinary tract.  You are female. What are the signs or symptoms? Symptoms of this condition include:  Fever.  Frequent urination or passing small amounts of urine frequently.  Needing to urinate urgently.  Pain or burning with urination.  Urine that smells bad or unusual.  Cloudy urine.  Pain in the lower abdomen or back.  Trouble urinating.  Blood in  the urine.  Vomiting or being less hungry than normal.  Diarrhea or abdominal pain.  Vaginal discharge, if you are female. How is this diagnosed? This condition is diagnosed with a medical history and physical exam. You will also need to provide a urine sample to test your urine. Other tests may be done, including:  Blood tests.  Sexually transmitted disease (STD) testing. If you have had more than one UTI, a cystoscopy or imaging studies may be done to determine the cause of the infections. How is this treated? Treatment for this condition often includes a combination of two or more of the following:  Antibiotic medicine.  Other medicines to treat less common causes of UTI.  Over-the-counter medicines to treat pain.  Drinking enough water to stay hydrated. Follow these instructions at home:  Take over-the-counter and prescription medicines only as told by your health care provider.  If you were prescribed an antibiotic, take it as told by your health care provider. Do not stop taking the antibiotic even if you start to feel better.  Avoid alcohol, caffeine, tea, and carbonated beverages. They can irritate your bladder.  Drink enough fluid to keep your urine clear or pale yellow.  Keep all  follow-up visits as told by your health care provider. This is important.  Make sure to:  Empty your bladder often and completely. Do not hold urine for long periods of time.  Empty your bladder before and after sex.  Wipe from front to back after a bowel movement if you are female. Use each tissue one time when you wipe. Contact a health care provider if:  You have back pain.  You have a fever.  You feel nauseous or vomit.  Your symptoms do not get better after 3 days.  Your symptoms go away and then return. Get help right away if:  You have severe back pain or lower abdominal pain.  You are vomiting and cannot keep down any medicines or water. This information is not  intended to replace advice given to you by your health care provider. Make sure you discuss any questions you have with your health care provider. Document Released: 01/25/2005 Document Revised: 09/29/2015 Document Reviewed: 03/08/2015 Elsevier Interactive Patient Education  2017 [employer]. Fluconazole tablets What is this medicine? FLUCONAZOLE (floo KON na zole) is an antifungal medicine. It is used to treat certain kinds of fungal or yeast infections. This medicine may be used for other purposes; ask your health care provider or pharmacist if you have questions. COMMON BRAND NAME(S): Diflucan What should I tell my health care provider before I take this medicine? They need to know if you have any of these conditions: -history of irregular heart beat -kidney disease -an unusual or allergic reaction to fluconazole, other azole antifungals, medicines, foods, dyes, or preservatives -pregnant or trying to get pregnant -breast-feeding How should I use this medicine? Take this medicine by mouth. Follow the directions on the prescription label. Do not take your medicine more often than directed. Talk to your pediatrician regarding the use of this medicine in children. Special care may be needed. This medicine has been used in children as young as 3 months of age. Overdosage: If you think you have taken too much of this medicine contact a poison control center or emergency room at once. NOTE: This medicine is only for you. Do not share this medicine with others. What if I miss a dose? If you miss a dose, take it as soon as you can. If it is almost time for your next dose, take only that dose. Do not take double or extra doses. What may interact with this medicine? Do not take this medicine with any of the following medications: -astemizole -certain medicines for irregular heart beat like dofetilide, dronedarone, quinidine -cisapride -erythromycin -lomitapide -other medicines that prolong  the QT interval (cause an abnormal heart rhythm) -pimozide -terfenadine -thioridazine -tolvaptan -ziprasidone This medicine may also interact with the following medications: -antiviral medicines for HIV or AIDS -birth control pills -certain antibiotics like rifabutin, rifampin -certain medicines for blood pressure like amlodipine, isradipine, felodipine, hydrochlorothiazide, losartan, nifedipine -certain medicines for cancer like cyclophosphamide, vinblastine, vincristine -certain medicines for cholesterol like atorvastatin, lovastatin, fluvastatin, simvastatin -certain medicines for depression, anxiety, or psychotic disturbances like amitriptyline, midazolam, nortriptyline, triazolam -certain medicines for diabetes like glipizide, glyburide, tolbutamide -certain medicines for pain like alfentanil, fentanyl, methadone -certain medicines for seizures like carbamazepine, phenytoin -certain medicines that treat or prevent blood clots like warfarin -halofantrine -medicines that lower your chance of fighting infection like cyclosporine, prednisone, tacrolimus -NSAIDS, medicines for pain and inflammation, like celecoxib, diclofenac, flurbiprofen, ibuprofen, meloxicam, naproxen -other medicines for fungal infections -sirolimus -theophylline -tofacitinib This list may not describe all possible interactions.  Give your health care provider a list of all the medicines, herbs, non-prescription drugs, or dietary supplements you use. Also tell them if you smoke, drink alcohol, or use illegal drugs. Some items may interact with your medicine. What should I watch for while using this medicine? Visit your doctor or health care professional for regular checkups. If you are taking this medicine for a long time you may need blood work. Tell your doctor if your symptoms do not improve. Some fungal infections need many weeks or months of treatment to cure. Alcohol can increase possible damage to your liver.  Avoid alcoholic drinks. If you have a vaginal infection, do not have sex until you have finished your treatment. You can wear a sanitary napkin. Do not use tampons. Wear freshly washed cotton, not synthetic, panties. What side effects may I notice from receiving this medicine? Side effects that you should report to your doctor or health care professional as soon as possible: -allergic reactions like skin rash or itching, hives, swelling of the lips, mouth, tongue, or throat -dark urine -feeling dizzy or faint -irregular heartbeat or chest pain -redness, blistering, peeling or loosening of the skin, including inside the mouth -trouble breathing -unusual bruising or bleeding -vomiting -yellowing of the eyes or skin Side effects that usually do not require medical attention (report to your doctor or health care professional if they continue or are bothersome): -changes in how food tastes -diarrhea -headache -stomach upset or nausea This list may not describe all possible side effects. Call your doctor for medical advice about side effects. You may report side effects to FDA at 1-800-FDA-1088. Where should I keep my medicine? Keep out of the reach of children. Store at room temperature below 30 degrees C (86 degrees F). Throw away any medicine after the expiration date. NOTE: This sheet is a summary. It may not cover all possible information. If you have questions about this medicine, talk to your doctor, pharmacist, or health care provider.  2018 Elsevier/Gold Standard (2012-11-23 19:37:38) Vaginal Yeast infection, Adult Vaginal yeast infection is a condition that causes soreness, swelling, and redness (inflammation) of the vagina. It also causes vaginal discharge. This is a common condition. Some women get this infection frequently. What are the causes? This condition is caused by a change in the normal balance of the yeast (candida) and bacteria that live in the vagina. This change causes  an overgrowth of yeast, which causes the inflammation. What increases the risk? This condition is more likely to develop in:  Women who take antibiotic medicines.  Women who have diabetes.  Women who take birth control pills.  Women who are pregnant.  Women who douche often.  Women who have a weak defense (immune) system.  Women who have been taking steroid medicines for a long time.  Women who frequently wear tight clothing. What are the signs or symptoms? Symptoms of this condition include:  White, thick vaginal discharge.  Swelling, itching, redness, and irritation of the vagina. The lips of the vagina (vulva) may be affected as well.  Pain or a burning feeling while urinating.  Pain during sex. How is this diagnosed? This condition is diagnosed with a medical history and physical exam. This will include a pelvic exam. Your health care provider will examine a sample of your vaginal discharge under a microscope. Your health care provider may send this sample for testing to confirm the diagnosis. How is this treated? This condition is treated with medicine. Medicines may be over-the-counter or  prescription. You may be told to use one or more of the following:  Medicine that is taken orally.  Medicine that is applied as a cream.  Medicine that is inserted directly into the vagina (suppository). Follow these instructions at home:  Take or apply over-the-counter and prescription medicines only as told by your health care provider.  Do not have sex until your health care provider has approved. Tell your sex partner that you have a yeast infection. That person should go to his or her health care provider if he or she develops symptoms.  Do not wear tight clothes, such as pantyhose or tight pants.  Avoid using tampons until your health care provider approves.  Eat more yogurt. This may help to keep your yeast infection from returning.  Try taking a sitz bath to help with  discomfort. This is a warm water bath that is taken while you are sitting down. The water should only come up to your hips and should cover your buttocks. Do this 3-4 times per day or as told by your health care provider.  Do not douche.  Wear breathable, cotton underwear.  If you have diabetes, keep your blood sugar levels under control. Contact a health care provider if:  You have a fever.  Your symptoms go away and then return.  Your symptoms do not get better with treatment.  Your symptoms get worse.  You have new symptoms.  You develop blisters in or around your vagina.  You have blood coming from your vagina and it is not your menstrual period.  You develop pain in your abdomen. This information is not intended to replace advice given to you by your health care provider. Make sure you discuss any questions you have with your health care provider. Document Released: 01/25/2005 Document Revised: 09/29/2015 Document Reviewed: 10/19/2014 Elsevier Interactive Patient Education  2017 [employer].

## 2016-09-22 NOTE — Progress Notes (Signed)
   Patient is an 83 year old who presented to the office complaining some vulvar pruritus slightly yellow discharge and urinary frequency and some dysuria. Patient denied any back pain, fever, chills, nausea or vomiting. Patient has a ring pessary for support as a result of her prolapse.  Exam: Gen. appearance well-developed well nourished female in above-mentioned complaining Pelvic: Bartholin urethra Skene was within normal limits Vaginal ring removed white discharge was noted the pessary was cleaned and put back into the vagina. First-degree cystocele was noted Cervix no gross lesions on inspection Bimanual exam not done Rectal exam: Not done  Wet prep moderate yeast, too numerous call white blood cell, moderate bacteria  Urinalysis moderate bacteria 10-20 WBC  Assessment/plan: Assessment/plan: Based on patient's symptoms and finding on urinalysis we'll treat her for possible UTI with Macrobid one by mouth twice a day for 7 days. She'll be prescribed Pyridium 200 mg 3 times a day for 3 days for bladder spasm and dysuria. And because of her yeast vaginitis she'll be prescribed Diflucan 150 mg one by mouth today and then 1 more tablet after she completes the one week treatment with the Macrobid.

## 2016-09-28 LAB — URINE CULTURE

## 2016-10-04 ENCOUNTER — Ambulatory Visit (INDEPENDENT_AMBULATORY_CARE_PROVIDER_SITE_OTHER): Payer: Medicare Other

## 2016-10-04 VITALS — BP 140/60 | HR 50 | Temp 98.0°F | Ht 63.0 in | Wt 138.0 lb

## 2016-10-04 DIAGNOSIS — Z0000 Encounter for general adult medical examination without abnormal findings: Secondary | ICD-10-CM

## 2016-10-04 NOTE — Patient Instructions (Addendum)
Rachel Morris , Thank you for taking time to come for your Medicare Wellness Visit. I appreciate your ongoing commitment to your health goals. Please review the following plan we discussed and let me know if I can assist you in the future.   Screening recommendations/referrals: Colonoscopy up to date Mammogram up to date Bone Density up to date Recommended yearly ophthalmology/optometry visit for glaucoma screening and checkup Recommended yearly dental visit for hygiene and checkup  Vaccinations: Influenza vaccine due 05/03/2016 Pneumococcal vaccine up to date Tdap vaccine due 05/03/2025 Shingles vaccine up to date. If you want the new vaccine we can send prescription to your pharmcy  Advanced directives: In chart  Conditions/risks identified: None  Next appointment: Dr Mariea Clonts 6/18 @ 9am    Preventive Care 65 Years and Older, Female Preventive care refers to lifestyle choices and visits with your health care provider that can promote health and wellness. What does preventive care include?  A yearly physical exam. This is also called an annual well check.  Dental exams once or twice a year.  Routine eye exams. Ask your health care provider how often you should have your eyes checked.  Personal lifestyle choices, including:  Daily care of your teeth and gums.  Regular physical activity.  Eating a healthy diet.  Avoiding tobacco and drug use.  Limiting alcohol use.  Practicing safe sex.  Taking low-dose aspirin every day.  Taking vitamin and mineral supplements as recommended by your health care provider. What happens during an annual well check? The services and screenings done by your health care provider during your annual well check will depend on your age, overall health, lifestyle risk factors, and family history of disease. Counseling  Your health care provider may ask you questions about your:  Alcohol use.  Tobacco use.  Drug use.  Emotional  well-being.  Home and relationship well-being.  Sexual activity.  Eating habits.  History of falls.  Memory and ability to understand (cognition).  Work and work Statistician.  Reproductive health. Screening  You may have the following tests or measurements:  Height, weight, and BMI.  Blood pressure.  Lipid and cholesterol levels. These may be checked every 5 years, or more frequently if you are over 56 years old.  Skin check.  Lung cancer screening. You may have this screening every year starting at age 41 if you have a 30-pack-year history of smoking and currently smoke or have quit within the past 15 years.  Fecal occult blood test (FOBT) of the stool. You may have this test every year starting at age 68.  Flexible sigmoidoscopy or colonoscopy. You may have a sigmoidoscopy every 5 years or a colonoscopy every 10 years starting at age 28.  Hepatitis C blood test.  Hepatitis B blood test.  Sexually transmitted disease (STD) testing.  Diabetes screening. This is done by checking your blood sugar (glucose) after you have not eaten for a while (fasting). You may have this done every 1-3 years.  Bone density scan. This is done to screen for osteoporosis. You may have this done starting at age 5.  Mammogram. This may be done every 1-2 years. Talk to your health care provider about how often you should have regular mammograms. Talk with your health care provider about your test results, treatment options, and if necessary, the need for more tests. Vaccines  Your health care provider may recommend certain vaccines, such as:  Influenza vaccine. This is recommended every year.  Tetanus, diphtheria, and acellular pertussis (  Tdap, Td) vaccine. You may need a Td booster every 10 years.  Zoster vaccine. You may need this after age 13.  Pneumococcal 13-valent conjugate (PCV13) vaccine. One dose is recommended after age 62.  Pneumococcal polysaccharide (PPSV23) vaccine. One  dose is recommended after age 76. Talk to your health care provider about which screenings and vaccines you need and how often you need them. This information is not intended to replace advice given to you by your health care provider. Make sure you discuss any questions you have with your health care provider. Document Released: 05/14/2015 Document Revised: 01/05/2016 Document Reviewed: 02/16/2015 Elsevier Interactive Patient Education  2017 [place] Prevention in the Home Falls can cause injuries. They can happen to people of all ages. There are many things you can do to make your home safe and to help prevent falls. What can I do on the outside of my home?  Regularly fix the edges of walkways and driveways and fix any cracks.  Remove anything that might make you trip as you walk through a door, such as a raised step or threshold.  Trim any bushes or trees on the path to your home.  Use bright outdoor lighting.  Clear any walking paths of anything that might make someone trip, such as rocks or tools.  Regularly check to see if handrails are loose or broken. Make sure that both sides of any steps have handrails.  Any raised decks and porches should have guardrails on the edges.  Have any leaves, snow, or ice cleared regularly.  Use sand or salt on walking paths during winter.  Clean up any spills in your garage right away. This includes oil or grease spills. What can I do in the bathroom?  Use night lights.  Install grab bars by the toilet and in the tub and shower. Do not use towel bars as grab bars.  Use non-skid mats or decals in the tub or shower.  If you need to sit down in the shower, use a plastic, non-slip stool.  Keep the floor dry. Clean up any water that spills on the floor as soon as it happens.  Remove soap buildup in the tub or shower regularly.  Attach bath mats securely with double-sided non-slip rug tape.  Do not have throw rugs and other  things on the floor that can make you trip. What can I do in the bedroom?  Use night lights.  Make sure that you have a light by your bed that is easy to reach.  Do not use any sheets or blankets that are too big for your bed. They should not hang down onto the floor.  Have a firm chair that has side arms. You can use this for support while you get dressed.  Do not have throw rugs and other things on the floor that can make you trip. What can I do in the kitchen?  Clean up any spills right away.  Avoid walking on wet floors.  Keep items that you use a lot in easy-to-reach places.  If you need to reach something above you, use a strong step stool that has a grab bar.  Keep electrical cords out of the way.  Do not use floor polish or wax that makes floors slippery. If you must use wax, use non-skid floor wax.  Do not have throw rugs and other things on the floor that can make you trip. What can I do with my stairs?  Do  not leave any items on the stairs.  Make sure that there are handrails on both sides of the stairs and use them. Fix handrails that are broken or loose. Make sure that handrails are as long as the stairways.  Check any carpeting to make sure that it is firmly attached to the stairs. Fix any carpet that is loose or worn.  Avoid having throw rugs at the top or bottom of the stairs. If you do have throw rugs, attach them to the floor with carpet tape.  Make sure that you have a light switch at the top of the stairs and the bottom of the stairs. If you do not have them, ask someone to add them for you. What else can I do to help prevent falls?  Wear shoes that:  Do not have high heels.  Have rubber bottoms.  Are comfortable and fit you well.  Are closed at the toe. Do not wear sandals.  If you use a stepladder:  Make sure that it is fully opened. Do not climb a closed stepladder.  Make sure that both sides of the stepladder are locked into place.  Ask  someone to hold it for you, if possible.  Clearly mark and make sure that you can see:  Any grab bars or handrails.  First and last steps.  Where the edge of each step is.  Use tools that help you move around (mobility aids) if they are needed. These include:  Canes.  Walkers.  Scooters.  Crutches.  Turn on the lights when you go into a dark area. Replace any light bulbs as soon as they burn out.  Set up your furniture so you have a clear path. Avoid moving your furniture around.  If any of your floors are uneven, fix them.  If there are any pets around you, be aware of where they are.  Review your medicines with your doctor. Some medicines can make you feel dizzy. This can increase your chance of falling. Ask your doctor what other things that you can do to help prevent falls. This information is not intended to replace advice given to you by your health care provider. Make sure you discuss any questions you have with your health care provider. Document Released: 02/11/2009 Document Revised: 09/23/2015 Document Reviewed: 05/22/2014 Elsevier Interactive Patient Education  2017 [employer].

## 2016-10-04 NOTE — Progress Notes (Signed)
Subjective:   Rachel Morris is a 83 y.o. female who presents for Medicare Annual (Subsequent) preventive examination.      Objective:     Vitals: BP 140/60 (BP Location: Right Arm, Patient Position: Sitting)   Pulse (!) 50   Temp 98 F (36.7 C) (Oral)   Ht 5\' 3"  (1.6 m)   Wt 138 lb (62.6 kg)   BMI 24.45 kg/m   Body mass index is 24.45 kg/m.   Tobacco History  Smoking Status  . Never Smoker  Smokeless Tobacco  . Never Used     Counseling given: Not Answered   Past Medical History:  Diagnosis Date  . Allergic rhinitis due to pollen   . Anxiety state, unspecified   . Arthritis    RIGHT LEG, HANDS.  [name] Cancer (HCC)    FACE/NOSE -SKIN-squamous cell and basal cell.  . Disorder of bone and cartilage, unspecified   . Lumbago   . Lyme borreliosis   . Other atopic dermatitis and related conditions   . Other dysphagia   . Reflux esophagitis   . Senile osteoporosis   . TMJ dysfunction    MORE ON LEFT "WEARS BITE PROTECTION AT NIGHT"   Past Surgical History:  Procedure Laterality Date  . APPENDECTOMY  1950  . CATARACT EXTRACTION, BILATERAL Bilateral   . EYE SURGERY Right    right eye ptrigium excision x2  . HERNIA REPAIR Bilateral   . TOTAL HIP ARTHROPLASTY Right 12/10/2015   Procedure: RIGHT TOTAL HIP ARTHROPLASTY ANTERIOR APPROACH;  Surgeon: [name], MD;  Location: [facility];  Service: Orthopedics;  Laterality: Right;  Spinal to General   History reviewed. No pertinent family history. History  Sexual Activity  . Sexual activity: No    Outpatient Encounter Prescriptions as of 10/04/2016  Medication Sig  . acetaminophen (TYLENOL) 325 MG tablet Take 650 mg by mouth every 6 (six) hours as needed for mild pain.  . cholecalciferol (VITAMIN D) 1000 UNITS tablet Take 1,000 Units by mouth daily.  [name] CRANBERRY PO Take 1 tablet by mouth daily.  . Garlic 294 MG TABS Take by mouth daily.  . Probiotic Product (PROBIOTIC-10 PO) Take 1 tablet by mouth daily.    . [DISCONTINUED] nitrofurantoin, macrocrystal-monohydrate, (MACROBID) 100 MG capsule Take 1 capsule (100 mg total) by mouth 2 (two) times daily.  . [DISCONTINUED] phenazopyridine (PYRIDIUM) 200 MG tablet Take 1 tablet (200 mg total) by mouth 3 (three) times daily as needed for pain.   No facility-administered encounter medications on file as of 10/04/2016.     Activities of Daily Living In your present state of health, do you have any difficulty performing the following activities: 10/04/2016 12/10/2015  Hearing? N N  Vision? N N  Difficulty concentrating or making decisions? Y N  Walking or climbing stairs? N N  Dressing or bathing? N N  Doing errands, shopping? N N  Preparing Food and eating ? N -  Using the Toilet? N -  In the past six months, have you accidently leaked urine? Y -  Do you have problems with loss of bowel control? N -  Managing your Medications? N -  Managing your Finances? N -  Housekeeping or managing your Housekeeping? N -  Some recent data might be hidden    Patient Care Team: [name], DO as PCP - General (Geriatric Medicine) Mast, Man X, NP as Nurse Practitioner (Internal Medicine)    Assessment:     Exercise Activities and Dietary  recommendations Current Exercise Habits: Home exercise routine, Type of exercise: walking, Time (Minutes): 45, Frequency (Times/Week): 4, Weekly Exercise (Minutes/Week): 180, Intensity: Mild, Exercise limited by: None identified  Goals    . Lose weight          Pt would like to get down to 132 lbs by walking more.      Fall Risk Fall Risk  10/04/2016 10/15/2015 04/16/2015 10/16/2014 04/06/2014  Falls in the past year? No No No No Yes  Number falls in past yr: - - - - 1  Injury with Fall? - - - - Yes   Depression Screen PHQ 2/9 Scores 10/04/2016 10/15/2015 07/06/2015 12/27/2014  PHQ - 2 Score 1 0 0 1     Cognitive Function MMSE - Mini Mental State Exam 10/04/2016 10/15/2015  Orientation to time 5 5  Orientation to Place  5 4  Registration 3 3  Attention/ Calculation 3 5  Recall 2 2  Language- name 2 objects 2 2  Language- repeat 1 1  Language- follow 3 step command 3 3  Language- read & follow direction 1 1  Write a sentence 1 1  Copy design 1 1  Total score 27 28        Immunization History  Administered Date(s) Administered  . Hepatitis B 05/01/2010, 07/31/2010, 11/30/2010  . Influenza Whole 05/01/2010  . Influenza,inj,Quad PF,36+ Mos 02/06/2013  . Influenza-Unspecified 02/15/2012, 02/27/2014, 03/02/2015, 02/10/2016  . PPD Test 04/04/2013  . Pneumococcal Conjugate-13 04/06/2014  . Pneumococcal Polysaccharide-23 05/01/2010  . Td 05/02/2003  . Tdap 05/04/2015  . Zoster 05/01/2010   Screening Tests Health Maintenance  Topic Date Due  . MAMMOGRAM  10/27/2015  . INFLUENZA VACCINE  11/29/2016  . TETANUS/TDAP  05/03/2025  . DEXA SCAN  Completed  . PNA vac Low Risk Adult  Completed      Plan:    I have personally reviewed and addressed the Medicare Annual Wellness questionnaire and have noted the following in the patient's chart:  A. Medical and social history B. Use of alcohol, tobacco or illicit drugs  C. Current medications and supplements D. Functional ability and status E.  Nutritional status F.  Physical activity G. Advance directives H. List of other physicians I.  Hospitalizations, surgeries, and ER visits in previous 12 months J.  [place] to include hearing, vision, cognitive, depression L. Referrals and appointments - none  In addition, I have reviewed and discussed with patient certain preventive protocols, quality metrics, and best practice recommendations. A written personalized care plan for preventive services as well as general preventive health recommendations were provided to patient.  See attached scanned questionnaire for additional information.   Signed,   Rich Reining, RN Nurse Health Advisor   Quick Notes   Health Maintenance: Up to date.       Abnormal Screen: MMSE 27/30.  Passed clock Drawing     Patient Concerns: Recurrent UTIs. Starting to take vaginal probiotics. Wondering if she can have PRN prescription for miralax.     Nurse Concerns: Discussed trying melatonin at night to help sleep versus alcohol.

## 2016-10-16 ENCOUNTER — Ambulatory Visit (INDEPENDENT_AMBULATORY_CARE_PROVIDER_SITE_OTHER): Payer: Medicare Other | Admitting: Internal Medicine

## 2016-10-16 ENCOUNTER — Encounter: Payer: Self-pay | Admitting: Internal Medicine

## 2016-10-16 ENCOUNTER — Other Ambulatory Visit: Payer: Medicare Other

## 2016-10-16 VITALS — BP 130/60 | HR 83 | Temp 97.8°F | Ht 63.0 in | Wt 133.0 lb

## 2016-10-16 DIAGNOSIS — Z96641 Presence of right artificial hip joint: Secondary | ICD-10-CM | POA: Diagnosis not present

## 2016-10-16 DIAGNOSIS — Z960 Presence of urogenital implants: Secondary | ICD-10-CM | POA: Insufficient documentation

## 2016-10-16 DIAGNOSIS — N814 Uterovaginal prolapse, unspecified: Secondary | ICD-10-CM | POA: Diagnosis not present

## 2016-10-16 DIAGNOSIS — M810 Age-related osteoporosis without current pathological fracture: Secondary | ICD-10-CM

## 2016-10-16 DIAGNOSIS — N952 Postmenopausal atrophic vaginitis: Secondary | ICD-10-CM

## 2016-10-16 DIAGNOSIS — M1611 Unilateral primary osteoarthritis, right hip: Secondary | ICD-10-CM | POA: Diagnosis not present

## 2016-10-16 DIAGNOSIS — E785 Hyperlipidemia, unspecified: Secondary | ICD-10-CM | POA: Diagnosis not present

## 2016-10-16 DIAGNOSIS — Z0000 Encounter for general adult medical examination without abnormal findings: Secondary | ICD-10-CM

## 2016-10-16 DIAGNOSIS — D62 Acute posthemorrhagic anemia: Secondary | ICD-10-CM | POA: Diagnosis not present

## 2016-10-16 DIAGNOSIS — Z23 Encounter for immunization: Secondary | ICD-10-CM | POA: Diagnosis not present

## 2016-10-16 DIAGNOSIS — Z9289 Personal history of other medical treatment: Secondary | ICD-10-CM | POA: Diagnosis not present

## 2016-10-16 LAB — COMPLETE METABOLIC PANEL WITH GFR
ALT: 16 U/L (ref 6–29)
AST: 20 U/L (ref 10–35)
Albumin: 4.1 g/dL (ref 3.6–5.1)
Alkaline Phosphatase: 75 U/L (ref 33–130)
BUN: 17 mg/dL (ref 7–25)
CO2: 26 mmol/L (ref 20–31)
Calcium: 10 mg/dL (ref 8.6–10.4)
Chloride: 104 mmol/L (ref 98–110)
Creat: 0.75 mg/dL (ref 0.60–0.88)
GFR, Est African American: 85 mL/min (ref >=60)
GFR, Est Non African American: 74 mL/min (ref >=60)
Glucose, Bld: 85 mg/dL (ref 65–99)
Potassium: 4.7 mmol/L (ref 3.5–5.3)
Sodium: 138 mmol/L (ref 135–146)
Total Bilirubin: 0.5 mg/dL (ref 0.2–1.2)
Total Protein: 6.7 g/dL (ref 6.1–8.1)

## 2016-10-16 LAB — LIPID PANEL
Cholesterol: 232 mg/dL — ABNORMAL HIGH (ref <200)
HDL: 88 mg/dL (ref >50)
LDL Cholesterol: 128 mg/dL — ABNORMAL HIGH (ref <100)
Total CHOL/HDL Ratio: 2.6 Ratio (ref <5.0)
Triglycerides: 82 mg/dL (ref <150)
VLDL: 16 mg/dL (ref <30)

## 2016-10-16 LAB — CBC WITH DIFFERENTIAL/PLATELET
Basophils Absolute: 68 cells/uL (ref 0–200)
Basophils Relative: 1 %
Eosinophils Absolute: 204 cells/uL (ref 15–500)
Eosinophils Relative: 3 %
HCT: 40.3 % (ref 35.0–45.0)
Hemoglobin: 13.2 g/dL (ref 11.7–15.5)
Lymphocytes Relative: 31 %
Lymphs Abs: 2108 cells/uL (ref 850–3900)
MCH: 31.4 pg (ref 27.0–33.0)
MCHC: 32.8 g/dL (ref 32.0–36.0)
MCV: 95.7 fL (ref 80.0–100.0)
MPV: 11.5 fL (ref 7.5–12.5)
Monocytes Absolute: 612 cells/uL (ref 200–950)
Monocytes Relative: 9 %
Neutro Abs: 3808 cells/uL (ref 1500–7800)
Neutrophils Relative %: 56 %
Platelets: 258 10*3/uL (ref 140–400)
RBC: 4.21 MIL/uL (ref 3.80–5.10)
RDW: 13.6 % (ref 11.0–15.0)
WBC: 6.8 10*3/uL (ref 3.8–10.8)

## 2016-10-16 MED ORDER — ZOSTER VAC RECOMB ADJUVANTED 50 MCG/0.5ML IM SUSR: 0.5000 mL | Freq: Once | INTRAMUSCULAR | 1 refills | Status: AC

## 2016-10-16 NOTE — Progress Notes (Signed)
Location:  [facility] clinic Provider:  Aleanna Menge L. Mariea Clonts, D.O., C.M.D.  Code Status: DNR Goals of Care:  Advanced Directives 10/16/2016  Does Patient Have a Medical Advance Directive? Yes  Type of Advance Directive Living will;Out of facility DNR (pink MOST or yellow form)  Does patient want to make changes to medical advance directive? -  Copy of [place] in Chart? -  Would patient like information on creating a medical advance directive? -  Pre-existing out of facility DNR order (yellow form or pink MOST form) Yellow form placed in chart (order not valid for inpatient use)     Chief Complaint  Patient presents with  . Annual Exam    CPE    HPI: Patient is a 83 y.o. female seen today for medical management of chronic diseases.    Discussed shingrix and printed for her to bring to [facility] or a [facility] if [facility] does not have someone to give it to her.  Got melatonin 66m for sleep.  She is going to try two now b/c she is still waking up at night.  She has been thinking she had UTIs.  She got a yeast infection from abx.  Cultures have been negative for bacteria.  She has gotten a probiotic and plans to take it. Has pessary for cystocele and vaginal atrophy.  Uses wipes now.  Sees her gyn tomorrow--Dr. HJerilee Hohand NIzora Gala  Notes she gets some mold and fungus in her apt.    Hip has been good. Still has numbness in the lateral thigh.  [facility].  She has some scar tissue above the hip that was noticed near the end of her PT.  Tennis ball helps that area.  Walking the perimeter of [facility].  There is a new tai chi class--nurse and tai chi instructor are doing this new one and she attends 2 times per week.  Does her hip exercises 1-2 times per week with a 95yo friend.   Here for CPE today. She had her labs first thing this am.    Past Medical History:  Diagnosis Date  . Allergic rhinitis due to  pollen   . Anxiety state, unspecified   . Arthritis    RIGHT LEG, HANDS.  .Marland KitchenCancer (HCC)    FACE/NOSE -SKIN-squamous cell and basal cell.  . Disorder of bone and cartilage, unspecified   . Lumbago   . Lyme borreliosis   . Other atopic dermatitis and related conditions   . Other dysphagia   . Reflux esophagitis   . Senile osteoporosis   . TMJ dysfunction    MORE ON LEFT "WEARS BITE PROTECTION AT NIGHT"    Past Surgical History:  Procedure Laterality Date  . APPENDECTOMY  1950  . CATARACT EXTRACTION, BILATERAL Bilateral   . EYE SURGERY Right    right eye ptrigium excision x2  . HERNIA REPAIR Bilateral   . TOTAL HIP ARTHROPLASTY Right 12/10/2015   Procedure: RIGHT TOTAL HIP ARTHROPLASTY ANTERIOR APPROACH;  Surgeon: CMcarthur Rossetti MD;  Location: [facility];  Service: Orthopedics;  Laterality: Right;  Spinal to General    Allergies  Allergen Reactions  . Brandy [Alcohol] Anaphylaxis    Related to grapes  . Prune Anaphylaxis    PLUMS,GRAPES,RAISINS  . Aspirin Other (See Comments)    Stomach bleed  . Augmentin [Amoxicillin-Pot Clavulanate] Hives and Itching  . Clindamycin Other (See Comments)  unkown  . Omnicef [Cefdinir] Hives  . Vinegar [Acetic Acid]     basalmic  . Alprazolam Rash  . Ceftin [Cefuroxime Axetil] Rash  . Doxycycline Rash  . Eryc [Erythromycin] Rash  . Penicillins Rash    Allergies as of 10/16/2016      Reactions   Brandy [alcohol] Anaphylaxis   Related to grapes   Prune Anaphylaxis   PLUMS,GRAPES,RAISINS   Aspirin Other (See Comments)   Stomach bleed   Augmentin [amoxicillin-pot Clavulanate] Hives, Itching   Clindamycin Other (See Comments)   unkown   Omnicef [cefdinir] Hives   Vinegar [acetic Acid]    basalmic   Alprazolam Rash   Ceftin [cefuroxime Axetil] Rash   Doxycycline Rash   Eryc [erythromycin] Rash   Penicillins Rash      Medication List       Accurate as of 10/16/16  9:18 AM. Always use your most recent med list.            acetaminophen 325 MG tablet Commonly known as:  TYLENOL Take 650 mg by mouth every 6 (six) hours as needed for mild pain.   cholecalciferol 1000 units tablet Commonly known as:  VITAMIN D Take 1,000 Units by mouth daily.   CRANBERRY PO Take 1 tablet by mouth daily.   Garlic 622 MG Tabs Take by mouth daily.   Melatonin 3 MG Caps Take 1 capsule by mouth at bedtime.   PROBIOTIC-10 PO Take 1 tablet by mouth daily.       Review of Systems:  Review of Systems  Constitutional: Negative for chills, fever and malaise/fatigue.  HENT: Positive for hearing loss.   Eyes: Negative for blurred vision.  Respiratory: Negative for cough and shortness of breath.   Cardiovascular: Negative for chest pain, palpitations and leg swelling.  Gastrointestinal: Negative for abdominal pain, blood in stool, constipation and melena.  Genitourinary: Negative for dysuria, frequency, hematuria and urgency.  Musculoskeletal: Positive for joint pain and myalgias. Negative for falls.  Neurological: Positive for sensory change. Negative for weakness.       Right thigh remains numb  Endo/Heme/Allergies: Bruises/bleeds easily.  Psychiatric/Behavioral: Positive for memory loss. Negative for depression. The patient is not nervous/anxious and does not have insomnia.     Health Maintenance  Topic Date Due  . MAMMOGRAM  10/27/2015  . INFLUENZA VACCINE  11/29/2016  . TETANUS/TDAP  05/03/2025  . DEXA SCAN  Completed  . PNA vac Low Risk Adult  Completed    Physical Exam: Vitals:   10/16/16 0850  BP: 130/60  Pulse: 83  Temp: 97.8 F (36.6 C)  TempSrc: Oral  SpO2: 99%  Weight: 133 lb (60.3 kg)  Height: _0  (1.6 m)   Body mass index is 23.56 kg/m. Physical Exam  Constitutional: She is oriented to person, place, and time. She appears well-developed and well-nourished. No distress.  Eyes: EOM are normal. Pupils are equal, round, and reactive to light.  glasses  Cardiovascular: Normal rate,  regular rhythm, normal heart sounds and intact distal pulses.   Pulmonary/Chest: Effort normal and breath sounds normal. No respiratory distress.  Abdominal: Bowel sounds are normal.  Musculoskeletal: Normal range of motion.  Walks w/o assistive device  Neurological: She is alert and oriented to person, place, and time.  Skin: Skin is warm and dry. There is pallor.  Psychiatric: She has a normal mood and affect.    Labs reviewed: Basic Metabolic Panel:  Recent Labs  11/30/15 1140 12/11/15 0505 04/17/16 1113  NA 137  135 137  K 4.7 4.0 4.6  CL 105 104 102  CO2 _0 GLUCOSE 93 125* 86  BUN _1 CREATININE 0.65 0.63 0.83  CALCIUM 10.0 9.1 10.3   Liver Function Tests: No results for input(s): AST, ALT, ALKPHOS, BILITOT, PROT, ALBUMIN in the last 8760 hours. No results for input(s): LIPASE, AMYLASE in the last 8760 hours. No results for input(s): AMMONIA in the last 8760 hours. CBC:  Recent Labs  11/30/15 1140 12/11/15 0505 12/20/15 04/17/16 1113  WBC 9.6 10.1 10.4 11.0*  NEUTROABS  --   --   --  7,040  HGB 14.0 11.2* 12.1 13.9  HCT 42.0 33.6* 37 42.4  MCV 95.9 96.6  --  95.3  PLT 277 226 570* 279   Assessment/Plan 1. Vaginal atrophy -contributes to dysuria symptoms b/c typically does not actually have UTI (no bacteria grow out on majority of cultures)  2. Cystocele with small rectocele and uterine descent -continues with pessary and follows with gyn  3. Presence of pessary -added to problem list  4. Need for shingles vaccine - Zoster Vac Recomb Adjuvanted [facility] 352) injection; Inject 0.5 mLs into the muscle once.  Dispense: 0.5 mL; Refill: 1 given Rx to get at pharmacy  5. Senile osteoporosis -cont vitamin D and weightbearing exercise with tai chi  6. Primary osteoarthritis of right hip -cont tylenol as needed, mostly gets some pain from scar tissue above her right hip  7. Status post total replacement of right hip -has chronic numbness now of  thigh, but overall much better and able to walker and do tai chi and PT exercises she was taught--continues all of this  Await labs done fasting this am  Labs/tests ordered:  No orders of the defined types were placed in this encounter.  Next appt:  6 mos med mgt  Temitayo Covalt L. Gareth Fitzner, D.O. [facility] [address], [place] [address] Cell Phone (Mon-Fri 8am-5pm):  [phone] On Call:  [phone] & follow prompts after 5pm & weekends Office Phone:  [phone] Office Fax:  [phone]

## 2016-10-17 ENCOUNTER — Encounter: Payer: Self-pay | Admitting: Women's Health

## 2016-10-17 ENCOUNTER — Ambulatory Visit (INDEPENDENT_AMBULATORY_CARE_PROVIDER_SITE_OTHER): Payer: Medicare Other | Admitting: Women's Health

## 2016-10-17 VITALS — BP 118/74 | Ht 64.0 in | Wt 135.0 lb

## 2016-10-17 DIAGNOSIS — Z4689 Encounter for fitting and adjustment of other specified devices: Secondary | ICD-10-CM | POA: Diagnosis not present

## 2016-10-17 DIAGNOSIS — N8110 Cystocele, unspecified: Secondary | ICD-10-CM

## 2016-10-17 NOTE — Patient Instructions (Signed)
Health Maintenance for Postmenopausal Women Menopause is a normal process in which your reproductive ability comes to an end. This process happens gradually over a span of months to years, usually between the ages of 22 and 9. Menopause is complete when you have missed 12 consecutive menstrual periods. It is important to talk with your health care provider about some of the most common conditions that affect postmenopausal women, such as heart disease, cancer, and bone loss (osteoporosis). Adopting a healthy lifestyle and getting preventive care can help to promote your health and wellness. Those actions can also lower your chances of developing some of these common conditions. What should I know about menopause? During menopause, you may experience a number of symptoms, such as:  Moderate-to-severe hot flashes.  Night sweats.  Decrease in sex drive.  Mood swings.  Headaches.  Tiredness.  Irritability.  Memory problems.  Insomnia.  Choosing to treat or not to treat menopausal changes is an individual decision that you make with your health care provider. What should I know about hormone replacement therapy and supplements? Hormone therapy products are effective for treating symptoms that are associated with menopause, such as hot flashes and night sweats. Hormone replacement carries certain risks, especially as you become older. If you are thinking about using estrogen or estrogen with progestin treatments, discuss the benefits and risks with your health care provider. What should I know about heart disease and stroke? Heart disease, heart attack, and stroke become more likely as you age. This may be due, in part, to the hormonal changes that your body experiences during menopause. These can affect how your body processes dietary fats, triglycerides, and cholesterol. Heart attack and stroke are both medical emergencies. There are many things that you can do to help prevent heart disease  and stroke:  Have your blood pressure checked at least every 1-2 years. High blood pressure causes heart disease and increases the risk of stroke.  If you are 53-22 years old, ask your health care provider if you should take aspirin to prevent a heart attack or a stroke.  Do not use any tobacco products, including cigarettes, chewing tobacco, or electronic cigarettes. If you need help quitting, ask your health care provider.  It is important to eat a healthy diet and maintain a healthy weight. ? Be sure to include plenty of vegetables, fruits, low-fat dairy products, and lean protein. ? Avoid eating foods that are high in solid fats, added sugars, or salt (sodium).  Get regular exercise. This is one of the most important things that you can do for your health. ? Try to exercise for at least 150 minutes each week. The type of exercise that you do should increase your heart rate and make you sweat. This is known as moderate-intensity exercise. ? Try to do strengthening exercises at least twice each week. Do these in addition to the moderate-intensity exercise.  Know your numbers.Ask your health care provider to check your cholesterol and your blood glucose. Continue to have your blood tested as directed by your health care provider.  What should I know about cancer screening? There are several types of cancer. Take the following steps to reduce your risk and to catch any cancer development as early as possible. Breast Cancer  Practice breast self-awareness. ? This means understanding how your breasts normally appear and feel. ? It also means doing regular breast self-exams. Let your health care provider know about any changes, no matter how small.  If you are 40  or older, have a clinician do a breast exam (clinical breast exam or CBE) every year. Depending on your age, family history, and medical history, it may be recommended that you also have a yearly breast X-ray (mammogram).  If you  have a family history of breast cancer, talk with your health care provider about genetic screening.  If you are at high risk for breast cancer, talk with your health care provider about having an MRI and a mammogram every year.  Breast cancer (BRCA) gene test is recommended for women who have family members with BRCA-related cancers. Results of the assessment will determine the need for genetic counseling and BRCA1 and for BRCA2 testing. BRCA-related cancers include these types: ? Breast. This occurs in males or females. ? Ovarian. ? Tubal. This may also be called fallopian tube cancer. ? Cancer of the abdominal or pelvic lining (peritoneal cancer). ? Prostate. ? Pancreatic.  Cervical, Uterine, and Ovarian Cancer Your health care provider may recommend that you be screened regularly for cancer of the pelvic organs. These include your ovaries, uterus, and vagina. This screening involves a pelvic exam, which includes checking for microscopic changes to the surface of your cervix (Pap test).  For women ages 21-65, health care providers may recommend a pelvic exam and a Pap test every three years. For women ages 79-65, they may recommend the Pap test and pelvic exam, combined with testing for human papilloma virus (HPV), every five years. Some types of HPV increase your risk of cervical cancer. Testing for HPV may also be done on women of any age who have unclear Pap test results.  Other health care providers may not recommend any screening for nonpregnant women who are considered low risk for pelvic cancer and have no symptoms. Ask your health care provider if a screening pelvic exam is right for you.  If you have had past treatment for cervical cancer or a condition that could lead to cancer, you need Pap tests and screening for cancer for at least 20 years after your treatment. If Pap tests have been discontinued for you, your risk factors (such as having a new sexual partner) need to be  reassessed to determine if you should start having screenings again. Some women have medical problems that increase the chance of getting cervical cancer. In these cases, your health care provider may recommend that you have screening and Pap tests more often.  If you have a family history of uterine cancer or ovarian cancer, talk with your health care provider about genetic screening.  If you have vaginal bleeding after reaching menopause, tell your health care provider.  There are currently no reliable tests available to screen for ovarian cancer.  Lung Cancer Lung cancer screening is recommended for adults 69-62 years old who are at high risk for lung cancer because of a history of smoking. A yearly low-dose CT scan of the lungs is recommended if you:  Currently smoke.  Have a history of at least 30 pack-years of smoking and you currently smoke or have quit within the past 15 years. A pack-year is smoking an average of one pack of cigarettes per day for one year.  Yearly screening should:  Continue until it has been 15 years since you quit.  Stop if you develop a health problem that would prevent you from having lung cancer treatment.  Colorectal Cancer  This type of cancer can be detected and can often be prevented.  Routine colorectal cancer screening usually begins at  age 42 and continues through age 45.  If you have risk factors for colon cancer, your health care provider may recommend that you be screened at an earlier age.  If you have a family history of colorectal cancer, talk with your health care provider about genetic screening.  Your health care provider may also recommend using home test kits to check for hidden blood in your stool.  A small camera at the end of a tube can be used to examine your colon directly (sigmoidoscopy or colonoscopy). This is done to check for the earliest forms of colorectal cancer.  Direct examination of the colon should be repeated every  5-10 years until age 71. However, if early forms of precancerous polyps or small growths are found or if you have a family history or genetic risk for colorectal cancer, you may need to be screened more often.  Skin Cancer  Check your skin from head to toe regularly.  Monitor any moles. Be sure to tell your health care provider: ? About any new moles or changes in moles, especially if there is a change in a mole's shape or color. ? If you have a mole that is larger than the size of a pencil eraser.  If any of your family members has a history of skin cancer, especially at a Delani Kohli age, talk with your health care provider about genetic screening.  Always use sunscreen. Apply sunscreen liberally and repeatedly throughout the day.  Whenever you are outside, protect yourself by wearing long sleeves, pants, a wide-brimmed hat, and sunglasses.  What should I know about osteoporosis? Osteoporosis is a condition in which bone destruction happens more quickly than new bone creation. After menopause, you may be at an increased risk for osteoporosis. To help prevent osteoporosis or the bone fractures that can happen because of osteoporosis, the following is recommended:  If you are 46-71 years old, get at least 1,000 mg of calcium and at least 600 mg of vitamin D per day.  If you are older than age 55 but younger than age 65, get at least 1,200 mg of calcium and at least 600 mg of vitamin D per day.  If you are older than age 54, get at least 1,200 mg of calcium and at least 800 mg of vitamin D per day.  Smoking and excessive alcohol intake increase the risk of osteoporosis. Eat foods that are rich in calcium and vitamin D, and do weight-bearing exercises several times each week as directed by your health care provider. What should I know about how menopause affects my mental health? Depression may occur at any age, but it is more common as you become older. Common symptoms of depression  include:  Low or sad mood.  Changes in sleep patterns.  Changes in appetite or eating patterns.  Feeling an overall lack of motivation or enjoyment of activities that you previously enjoyed.  Frequent crying spells.  Talk with your health care provider if you think that you are experiencing depression. What should I know about immunizations? It is important that you get and maintain your immunizations. These include:  Tetanus, diphtheria, and pertussis (Tdap) booster vaccine.  Influenza every year before the flu season begins.  Pneumonia vaccine.  Shingles vaccine.  Your health care provider may also recommend other immunizations. This information is not intended to replace advice given to you by your health care provider. Make sure you discuss any questions you have with your health care provider. Document Released: 06/09/2005  Document Revised: 11/05/2015 Document Reviewed: 01/19/2015 Elsevier Interactive Patient Education  2018 [employer] Inc[employer]  

## 2016-10-17 NOTE — Progress Notes (Signed)
Presents for pessary maintenance. 2017 T score -1.8 hip average. History of right hip osteoarthritis had hip replacement 11/2015 and doing well. Lives [facility] [place] in independent living. 2009 negative colonoscopy. Postmenopausal with no bleeding on no HRT. Normal Pap and mammogram history overdue. Primary care manages labs and vaccines. Postmenopausal on no HRT with no bleeding.  Exam: Heart regular rate and rhythm, clear throughout, no palpable thyroid nodules, breast exam and sitting and lying position without palpable nodules, no visible dimpling, retractions or skin changes. Abdomen soft without rebound or radiation of discomfort. External genitalia within normal limits, ring pessary with support removed, washed, replaced, speculum exam no visible perforation or erythema.  Cystocele/Pessary maintenance  Plan: Return to office in 3 months if needed for pessary maintenance. Continue healthy lifestyle of regular exercise, volunteer work, and self-care. SBE's, continue annual screening mammogram. Instructed to schedule. DEXA, instructed to schedule. Reviewed importance of home safety, fall prevention and importance of weightbearing exercise.

## 2016-10-19 ENCOUNTER — Encounter: Payer: Self-pay | Admitting: *Deleted

## 2016-10-19 ENCOUNTER — Other Ambulatory Visit: Payer: Medicare Other

## 2016-10-20 ENCOUNTER — Ambulatory Visit: Payer: Medicare Other | Admitting: Internal Medicine

## 2016-10-23 ENCOUNTER — Other Ambulatory Visit: Payer: Medicare Other

## 2016-10-23 ENCOUNTER — Ambulatory Visit: Payer: Medicare Other | Admitting: Internal Medicine

## 2016-10-23 ENCOUNTER — Ambulatory Visit: Payer: Medicare Other

## 2016-10-25 ENCOUNTER — Ambulatory Visit: Payer: Medicare Other | Admitting: Internal Medicine

## 2017-01-30 DIAGNOSIS — Z85828 Personal history of other malignant neoplasm of skin: Secondary | ICD-10-CM | POA: Diagnosis not present

## 2017-01-30 DIAGNOSIS — Z23 Encounter for immunization: Secondary | ICD-10-CM | POA: Diagnosis not present

## 2017-01-30 DIAGNOSIS — D224 Melanocytic nevi of scalp and neck: Secondary | ICD-10-CM | POA: Diagnosis not present

## 2017-01-30 DIAGNOSIS — L309 Dermatitis, unspecified: Secondary | ICD-10-CM | POA: Diagnosis not present

## 2017-01-30 DIAGNOSIS — L821 Other seborrheic keratosis: Secondary | ICD-10-CM | POA: Diagnosis not present

## 2017-01-30 DIAGNOSIS — D2272 Melanocytic nevi of left lower limb, including hip: Secondary | ICD-10-CM | POA: Diagnosis not present

## 2017-02-07 DIAGNOSIS — Z23 Encounter for immunization: Secondary | ICD-10-CM | POA: Diagnosis not present

## 2017-02-22 DIAGNOSIS — H40053 Ocular hypertension, bilateral: Secondary | ICD-10-CM | POA: Diagnosis not present

## 2017-02-22 DIAGNOSIS — Z961 Presence of intraocular lens: Secondary | ICD-10-CM | POA: Diagnosis not present

## 2017-02-22 DIAGNOSIS — H04123 Dry eye syndrome of bilateral lacrimal glands: Secondary | ICD-10-CM | POA: Diagnosis not present

## 2017-03-30 ENCOUNTER — Encounter: Payer: Self-pay | Admitting: *Deleted

## 2017-03-30 DIAGNOSIS — Z1231 Encounter for screening mammogram for malignant neoplasm of breast: Secondary | ICD-10-CM | POA: Diagnosis not present

## 2017-03-30 LAB — HM MAMMOGRAPHY

## 2017-04-16 ENCOUNTER — Encounter: Payer: Self-pay | Admitting: Internal Medicine

## 2017-04-16 ENCOUNTER — Ambulatory Visit (INDEPENDENT_AMBULATORY_CARE_PROVIDER_SITE_OTHER): Payer: Medicare Other | Admitting: Internal Medicine

## 2017-04-16 VITALS — BP 140/80 | HR 83 | Temp 98.4°F | Wt 131.0 lb

## 2017-04-16 DIAGNOSIS — E7800 Pure hypercholesterolemia, unspecified: Secondary | ICD-10-CM

## 2017-04-16 DIAGNOSIS — F419 Anxiety disorder, unspecified: Secondary | ICD-10-CM | POA: Diagnosis not present

## 2017-04-16 DIAGNOSIS — F5101 Primary insomnia: Secondary | ICD-10-CM | POA: Insufficient documentation

## 2017-04-16 DIAGNOSIS — Z9289 Personal history of other medical treatment: Secondary | ICD-10-CM | POA: Diagnosis not present

## 2017-04-16 DIAGNOSIS — Z789 Other specified health status: Secondary | ICD-10-CM | POA: Diagnosis not present

## 2017-04-16 DIAGNOSIS — M810 Age-related osteoporosis without current pathological fracture: Secondary | ICD-10-CM

## 2017-04-16 DIAGNOSIS — M1611 Unilateral primary osteoarthritis, right hip: Secondary | ICD-10-CM

## 2017-04-16 DIAGNOSIS — N814 Uterovaginal prolapse, unspecified: Secondary | ICD-10-CM

## 2017-04-16 DIAGNOSIS — Z960 Presence of urogenital implants: Secondary | ICD-10-CM

## 2017-04-16 DIAGNOSIS — Z7289 Other problems related to lifestyle: Secondary | ICD-10-CM
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
## 2017-04-17 ENCOUNTER — Telehealth: Payer: Self-pay | Admitting: *Deleted

## 2017-04-17 NOTE — Telephone Encounter (Signed)
Left detailed message on voicemail.  

## 2017-04-17 NOTE — Telephone Encounter (Signed)
Though her cholesterol level is satisfactory for her age and does not require medications or treatment, it is elevated above the normal range.  That's what hypercholesterolemia means.  It's been on her record--it's not new for yesterday.  I also used it to order her next set of labs.  She does not need to worry about this.  It's been stable for years.

## 2017-04-17 NOTE — Telephone Encounter (Signed)
Patient called and stated that she saw Dr. Mariea Clonts yesterday and was concerned with a Diagnosis on her AVS. Stated that on the report is stated she had Hypercholesterolemia and patient stated this was never discussed at her appointment, in fact she stated that her last labs Dr. Mariea Clonts said were [facility]. Stated she doesn't understand and it was never discussed. Patient would like to speak with Dr. Mariea Clonts concerning this. Patient stated if she is not home when calls, can leave detailed message.

## 2017-05-03 DIAGNOSIS — L821 Other seborrheic keratosis: Secondary | ICD-10-CM | POA: Diagnosis not present

## 2017-05-08 IMAGING — RF DG HIP (WITH PELVIS) OPERATIVE*R*
1 series · 2 of 2 positions shown · non-contrast
Comparison: 09/23/2015

CLINICAL DATA: right hip arthroplasty

[Series 1: run · 2 of 2 slices shown]
[im 1/2]
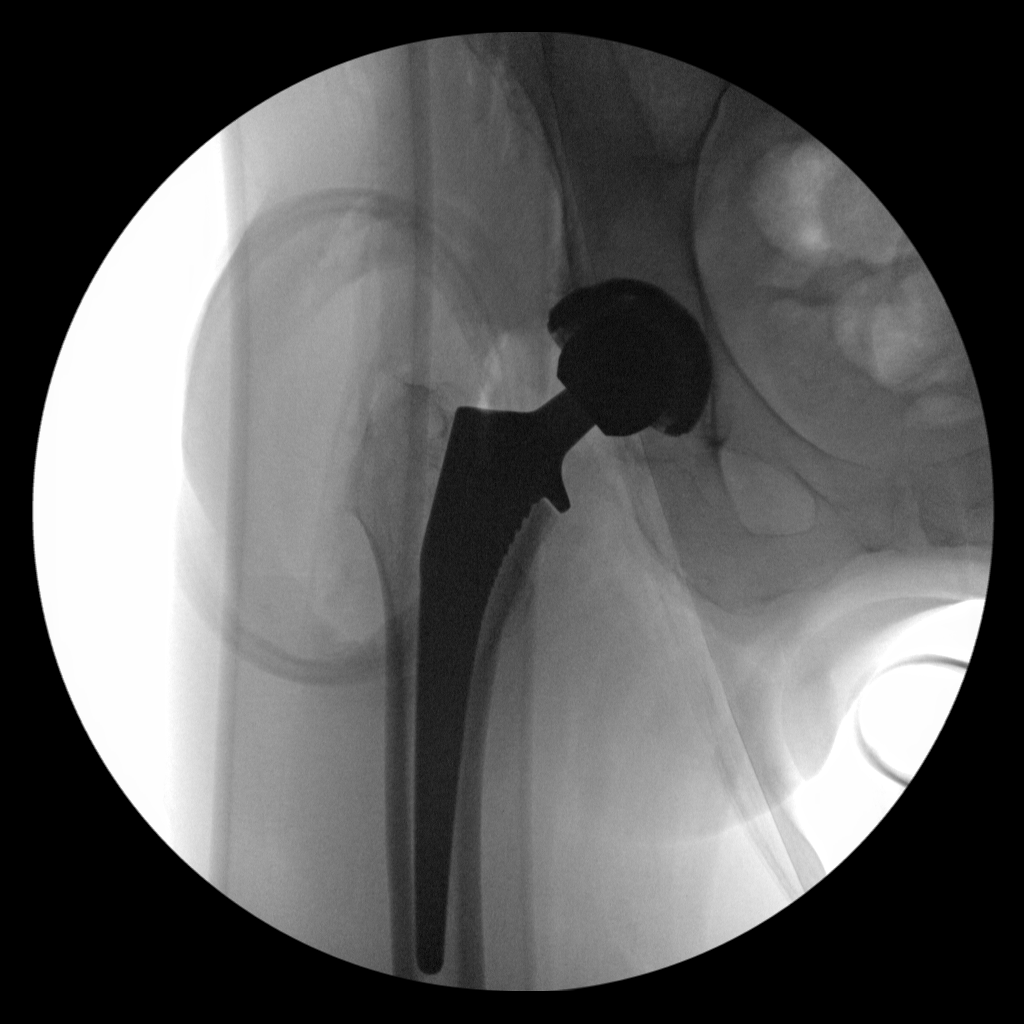
[im 2/2]
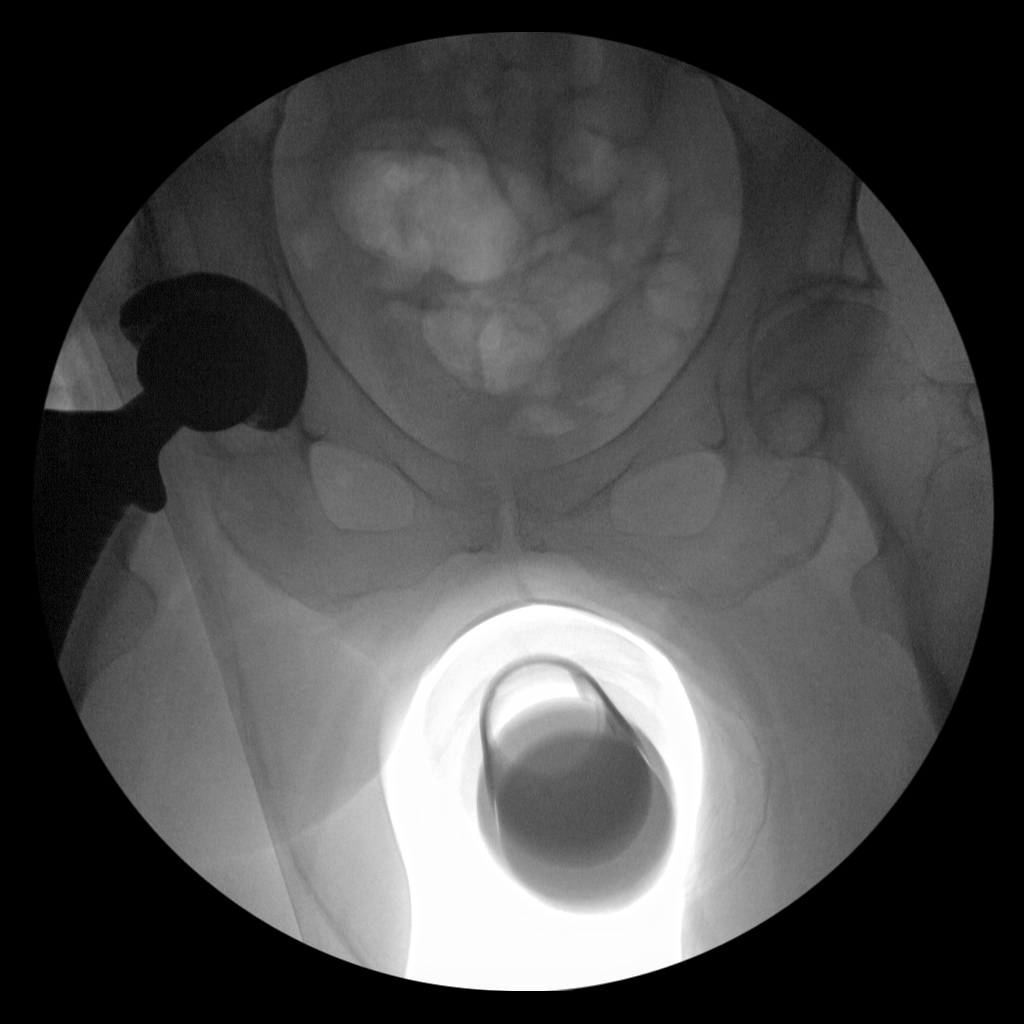

[2 of 2 positions shown; findings below may reference images not displayed]

EXAM:
DG C-ARM 1-60 MIN-NO REPORT; OPERATIVE RIGHT HIP WITH PELVIS

CONTRAST:  None

FLUOROSCOPY TIME:  Radiation Exposure Index (as provided by the
fluoroscopic device): 1.7 mGy

If the device does not provide the exposure index:

Fluoroscopy Time (in minutes and seconds):  25 seconds

Number of Acquired Images:  2
FINDINGS: Two views of the right hip submitted. There is right hip prosthesis
with anatomic alignment.
IMPRESSION: Right hip prosthesis with anatomic alignment.

Fluoroscopy time was 25 seconds.  Please see the operative report.

## 2017-05-29 ENCOUNTER — Encounter (INDEPENDENT_AMBULATORY_CARE_PROVIDER_SITE_OTHER): Payer: Self-pay | Admitting: Orthopaedic Surgery

## 2017-05-29 ENCOUNTER — Ambulatory Visit (INDEPENDENT_AMBULATORY_CARE_PROVIDER_SITE_OTHER): Payer: Medicare Other

## 2017-05-29 ENCOUNTER — Ambulatory Visit (INDEPENDENT_AMBULATORY_CARE_PROVIDER_SITE_OTHER): Payer: Medicare Other | Admitting: Orthopaedic Surgery

## 2017-05-29 DIAGNOSIS — Z96641 Presence of right artificial hip joint: Secondary | ICD-10-CM

## 2017-05-29 DIAGNOSIS — M7061 Trochanteric bursitis, right hip: Secondary | ICD-10-CM | POA: Diagnosis not present

## 2017-05-29 MED ORDER — METHYLPREDNISOLONE ACETATE 40 MG/ML IJ SUSP
40.0000 mg | INTRAMUSCULAR | Status: AC | PRN
  Administered 2017-05-29: 40 mg via INTRA_ARTICULAR

## 2017-05-29 MED ORDER — LIDOCAINE HCL 1 % IJ SOLN
3.0000 mL | INTRAMUSCULAR | Status: AC | PRN
  Administered 2017-05-29: 3 mL

## 2017-05-29 NOTE — Progress Notes (Signed)
Office Visit Note   Patient: Rachel Morris           Date of Birth: 15-Jul-1930           MRN: [id number] Visit Date: 05/29/2017              Requested by: [name], DO [place], [place] [address] PCP: [name], DO   Assessment & Plan: Visit Diagnoses:  1. History of right hip replacement   2. Trochanteric bursitis, right hip     Plan: I do feel this is more trochanteric bursitis.  I talked about trying a steroid injection over this area and she is agreeable to this on the right hip.  She tolerated it well.  All questions concerns were answered and addressed.  Follow-up as needed.  If anything worsens at all she will let us know.  Follow-Up Instructions: Return if symptoms worsen or fail to improve.   Orders:  Orders Placed This Encounter  Procedures  . Large Joint Inj  . XR HIP UNILAT W OR W/O PELVIS 1V RIGHT   No orders of the defined types were placed in this encounter.     Procedures: Large Joint Inj: R greater trochanter on 05/29/2017 9:02 AM Indications: pain and diagnostic evaluation Details: 22 G 1.5 in needle, lateral approach  Arthrogram: No  Medications: 3 mL lidocaine 1 %; 40 mg methylPREDNISolone acetate 40 MG/ML Outcome: tolerated well, no immediate complications Procedure, treatment alternatives, risks and benefits explained, specific risks discussed. Consent was given by the patient. Immediately prior to procedure a time out was called to verify the correct patient, procedure, equipment, support staff and site/side marked as required. Patient was prepped and draped in the usual sterile fashion.       Clinical Data: No additional findings.   Subjective: Chief Complaint  Patient presents with  . Right Hip - Follow-up  The patient is a pleasant 84 year old who is 18 months out from a right total hip arthroplasty.  She been doing well until she got back in some water therapy and some pain on the lateral aspect of her hip  and it hurts when she lays on that side at night.  Is been on and off and she does most things checked out.  She points the trochanteric area source of her pain and she denies any groin pain.  HPI  Review of Systems She currently denies any headache, chest pain, shortness of breath, fever, chills, nausea, vomiting she is alert and oriented x3 and in no acute distress  Objective: Vital Signs: There were no vitals taken for this visit.  Physical Exam She is alert and oriented x3 and in no acute distress Ortho Exam On exam she walks without a limp or assistive device.  She is very active 84 year old good I can put her right hip through full range of motion with no pain at all.  Her only pain is over the trochanteric area to deep palpation. Specialty Comments:  No specialty comments available.  Imaging: Xr Hip Unilat W Or W/o Pelvis 1v Right  Result Date: 05/29/2017 An AP pelvis and lateral right hip shows the implant is seated well.  There is slight varus malalignment of the stem but it does not appear to have any significant lucency at all the trochanteric area.    PMFS History: Patient Active Problem List   Diagnosis Date Noted  . Trochanteric bursitis, right hip 05/29/2017  . Anxiety 04/16/2017  .  Alcohol use 04/16/2017  . Primary insomnia 04/16/2017  . Presence of pessary 10/16/2016  . Status post total replacement of right hip 12/10/2015  . Osteoarthritis of right hip 10/15/2015  . Senile osteoporosis 09/30/2012  . Reflux esophagitis 09/30/2012  . Hyperlipidemia with target LDL less than 100   . Allergic rhinitis due to pollen   . Cystocele with small rectocele and uterine descent 09/09/2012  . Vaginal atrophy 09/09/2012   Past Medical History:  Diagnosis Date  . Allergic rhinitis due to pollen   . Anxiety state, unspecified   . Arthritis    RIGHT LEG, HANDS.  [name] Cancer (HCC)    FACE/NOSE -SKIN-squamous cell and basal cell.  . Disorder of bone and cartilage,  unspecified   . Lumbago   . Lyme borreliosis   . Other atopic dermatitis and related conditions   . Other dysphagia   . Reflux esophagitis   . Senile osteoporosis   . TMJ dysfunction    MORE ON LEFT "WEARS BITE PROTECTION AT NIGHT"    History reviewed. No pertinent family history.  Past Surgical History:  Procedure Laterality Date  . APPENDECTOMY  1950  . CATARACT EXTRACTION, BILATERAL Bilateral   . EYE SURGERY Right    right eye ptrigium excision x2  . HERNIA REPAIR Bilateral   . TOTAL HIP ARTHROPLASTY Right 12/10/2015   Procedure: RIGHT TOTAL HIP ARTHROPLASTY ANTERIOR APPROACH;  Surgeon: [name], MD;  Location: [facility];  Service: Orthopedics;  Laterality: Right;  Spinal to General   Social History   Occupational History  . Not on file  Tobacco Use  . Smoking status: Never Smoker  . Smokeless tobacco: Never Used  Substance and Sexual Activity  . Alcohol use: Yes    Alcohol/week: 0.0 oz    Comment:  1 shots at bedtime  . Drug use: No  . Sexual activity: No

## 2017-06-15 ENCOUNTER — Ambulatory Visit (INDEPENDENT_AMBULATORY_CARE_PROVIDER_SITE_OTHER): Payer: Medicare Other

## 2017-06-15 ENCOUNTER — Ambulatory Visit (INDEPENDENT_AMBULATORY_CARE_PROVIDER_SITE_OTHER): Payer: Medicare Other | Admitting: Family Medicine

## 2017-06-15 ENCOUNTER — Encounter: Payer: Self-pay | Admitting: Family Medicine

## 2017-06-15 VITALS — BP 140/84 | HR 104 | Temp 98.2°F | Resp 18 | Ht 64.0 in | Wt 134.4 lb

## 2017-06-15 DIAGNOSIS — R0781 Pleurodynia: Secondary | ICD-10-CM | POA: Diagnosis not present

## 2017-06-15 DIAGNOSIS — M25551 Pain in right hip: Secondary | ICD-10-CM | POA: Diagnosis not present

## 2017-06-15 DIAGNOSIS — Y92009 Unspecified place in unspecified non-institutional (private) residence as the place of occurrence of the external cause: Principal | ICD-10-CM

## 2017-06-15 DIAGNOSIS — W19XXXA Unspecified fall, initial encounter: Secondary | ICD-10-CM

## 2017-06-15 DIAGNOSIS — S8011XA Contusion of right lower leg, initial encounter: Secondary | ICD-10-CM | POA: Diagnosis not present

## 2017-06-15 DIAGNOSIS — S0093XA Contusion of unspecified part of head, initial encounter: Secondary | ICD-10-CM

## 2017-06-15 NOTE — Patient Instructions (Addendum)
Tylenol for pain, ice over affected areas. Gentle range of motion stretches, and try to maintain deep breathing at times to lessen risk for pneumonias or fluid collection within the lungs. If worsening pain, return for recheck.  If any new or worsening headaches, dizziness, or other new symptoms after head injury, return for evaluation here or emergency room    Head Injury, Adult There are many types of head injuries. Head injuries can be as minor as a bump, or they can be more severe. More severe head injuries include:  A jarring injury to the brain (concussion).  A bruise of the brain (contusion). This means there is bleeding in the brain that can cause swelling.  A cracked skull (skull fracture).  Bleeding in the brain that collects, clots, and forms a bump (hematoma).  After a head injury, you may need to be observed for a while in the emergency department or urgent care. Sometimes admission to the hospital is needed. After a head injury has happened, most problems occur within the first 24 hours, but side effects may occur up to 7-10 days after the injury. It is important to watch your condition for any changes. What are the causes? There are many possible causes of a head injury. A serious head injury may happen to someone who is in a car accident (motor vehicle collision). Other causes of major head injuries include bicycle or motorcycle accidents, sports injuries, and falls. Risk factors This condition is more likely to occur in people who:  Drink a lot of alcohol or use drugs.  Are over the age of 51.  Are at risk for falls.  What are the symptoms? There are many possible symptoms of a head injury. Visible symptoms of a head injury include a bruise, bump, or bleeding at the site of the injury. Other non-visible symptoms include:  Feeling sleepy or not being able to stay awake.  Passing out.  Headache.  Seizures.  Dizziness.  Confusion.  Memory  problems.  Nausea or vomiting.  Other possible symptoms that may develop after the head injury include:  Poor attention and concentration.  Fatigue or tiring easily.  Irritability.  Being uncomfortable around bright lights or loud noises.  Anxiety or depression.  Disturbed sleep.  How is this diagnosed? This condition can usually be diagnosed based on your symptoms, a description of the injury, and a physical exam. You may also have imaging tests done, such as a CT scan or MRI. You will also be closely watched. How is this treated? Treatment for this condition depends on the severity and type of injury you have. The main goal of treatment is to prevent complications and allow the brain time to heal. For mild head injury, you may be sent home and treatment may include:  Observation. A responsible adult should stay with you for 24 hours after your injury and check on you often.  Physical rest.  Brain rest.  Pain medicines.  For severe brain injury, treatment may include:  Close observation. This includes hospitalization with frequent physical exams. You may need to go to a hospital that specializes in head injury.  Pain medicines.  Breathing support. This may include using a ventilator.  Managing the pressure inside the brain (intracranial pressure, or ICP). This may include: ? Monitoring the ICP. ? Giving medicines to decrease the ICP. ? Positioning you to decrease the ICP.  Medicine to prevent seizures.  Surgery to stop bleeding or to remove blood clots (craniotomy).  Surgery to  remove part of the skull (decompressive craniectomy). This allows room for the brain to swell.  Follow these instructions at home: Activity  Rest as much as possible and avoid activities that are physically hard or tiring.  Make sure you get enough sleep.  Limit activities that require a lot of thought or attention, such as: ? Watching TV. ? Playing memory games and  puzzles. ? Job-related work or homework. ? Working on [employer], and texting.  Avoid activities that could cause another head injury, such as playing sports, until your health care provider approves. Having another head injury, especially before the first one has healed, can be dangerous.  Ask your health care provider when it is safe for you to return to your regular activities, including work or school. Ask your health care provider for a step-by-step plan for gradually returning to activities.  Ask your health care provider when you can drive, ride a bicycle, or use heavy machinery. Your ability to react may be slower after a brain injury. Never do these activities if you are dizzy.  Lifestyle  Do not drink alcohol until your health care provider approves, and avoid drug use. Alcohol and certain drugs may slow your recovery and can put you at risk of further injury.  If it is harder than usual to remember things, write them down.  If you are easily distracted, try to do one thing at a time.  Talk with family members or close friends when making important decisions.  Tell your friends, family, a trusted colleague, and work [employer] about your injury, symptoms, and restrictions. Have them watch for any new or worsening problems.  General instructions  Take over-the-counter and prescription medicines only as told by your health care provider.  Have someone stay with you for 24 hours after your head injury. This person should watch you for any changes in your symptoms and be ready to seek medical help, as needed.  Keep all follow-up visits as told by your health care provider. This is important.  Prevention  Work on improving your balance and strength to avoid falls.  Wear a seatbelt when you are in a moving vehicle.  Wear a helmet when riding a bicycle, skiing, or doing any other sport or activity that has a risk of injury.  Drink alcohol only in  moderation.  Take safety measures in your home, such as: ? Removing clutter and tripping hazards from floors and stairways. ? Using grab bars in bathrooms and handrails by stairs. ? Placing non-slip mats on floors and in bathtubs. ? Improving lighting in dim areas. Get help right away if:  You have: ? A severe headache that is not helped by medicine. ? Trouble walking, have weakness in your arms and legs, or lose your balance. ? Clear or bloody fluid coming from your nose or ears. ? Changes in your vision. ? A seizure.  You vomit.  Your symptoms get worse.  Your speech is slurred.  You pass out.  You are sleepier and have trouble staying awake.  Your pupils change size. These symptoms may represent a serious problem that is an emergency. Do not wait to see if the symptoms will go away. Get medical help right away. Call your local emergency services (911 in the [place] Do not drive yourself to the hospital. This information is not intended to replace advice given to you by your health care provider. Make sure you discuss any questions you have with your health care  provider. Document Released: 04/17/2005 Document Revised: 11/12/2015 Document Reviewed: 10/26/2015 Elsevier Interactive Patient Education  2017 [place] A contusion is a deep bruise. Contusions are the result of a blunt injury to tissues and muscle fibers under the skin. The injury causes bleeding under the skin. The skin overlying the contusion may turn blue, purple, or yellow. Minor injuries will give you a painless contusion, but more severe contusions may stay painful and swollen for a few weeks. What are the causes? This condition is usually caused by a blow, trauma, or direct force to an area of the body. What are the signs or symptoms? Symptoms of this condition include:  Swelling of the injured area.  Pain and tenderness in the injured area.  Discoloration. The area may have redness and  then turn blue, purple, or yellow.  How is this diagnosed? This condition is diagnosed based on a physical exam and medical history. An X-ray, CT scan, or MRI may be needed to determine if there are any associated injuries, such as broken bones (fractures). How is this treated? Specific treatment for this condition depends on what area of the body was injured. In general, the best treatment for a contusion is resting, icing, applying pressure to (compression), and elevating the injured area. This is often called the RICE strategy. Over-the-counter anti-inflammatory medicines may also be recommended for pain control. Follow these instructions at home:  Rest the injured area.  If directed, apply ice to the injured area: ? Put ice in a plastic bag. ? Place a towel between your skin and the bag. ? Leave the ice on for 20 minutes, 2-3 times per day.  If directed, apply light compression to the injured area using an elastic bandage. Make sure the bandage is not wrapped too tightly. Remove and reapply the bandage as directed by your health care provider.  If possible, raise (elevate) the injured area above the level of your heart while you are sitting or lying down.  Take over-the-counter and prescription medicines only as told by your health care provider. Contact a health care provider if:  Your symptoms do not improve after several days of treatment.  Your symptoms get worse.  You have difficulty moving the injured area. Get help right away if:  You have severe pain.  You have numbness in a hand or foot.  Your hand or foot turns pale or cold. This information is not intended to replace advice given to you by your health care provider. Make sure you discuss any questions you have with your health care provider. Document Released: 01/25/2005 Document Revised: 08/26/2015 Document Reviewed: 09/02/2014 Elsevier Interactive Patient Education  2018 [employer].  Rib Contusion A rib  contusion is a deep bruise on your rib area. Contusions are the result of a blunt trauma that causes bleeding and injury to the tissues under the skin. A rib contusion may involve bruising of the ribs and of the skin and muscles in the area. The skin overlying the contusion may turn blue, purple, or yellow. Minor injuries will give you a painless contusion, but more severe contusions may stay painful and swollen for a few weeks. What are the causes? A contusion is usually caused by a blow, trauma, or direct force to an area of the body. This often occurs while playing contact sports. What are the signs or symptoms?  Swelling and redness of the injured area.  Discoloration of the injured area.  Tenderness and soreness of the injured area.  Pain with or without movement. How is this diagnosed? The diagnosis can be made by taking a medical history and performing a physical exam. An X-ray, CT scan, or MRI may be needed to determine if there were any associated injuries, such as broken bones (fractures) or internal injuries. How is this treated? Often, the best treatment for a rib contusion is rest. Icing or applying cold compresses to the injured area may help reduce swelling and inflammation. Deep breathing exercises may be recommended to reduce the risk of partial lung collapse and pneumonia. Over-the-counter or prescription medicines may also be recommended for pain control. Follow these instructions at home:  Apply ice to the injured area: ? Put ice in a plastic bag. ? Place a towel between your skin and the bag. ? Leave the ice on for 20 minutes, 2-3 times per day.  Take medicines only as directed by your health care provider.  Rest the injured area. Avoid strenuous activity and any activities or movements that cause pain. Be careful during activities and avoid bumping the injured area.  Perform deep-breathing exercises as directed by your health care provider.  Do not lift anything  that is heavier than 5 lb (2.3 kg) until your health care provider approves.  Do not use any tobacco products, including cigarettes, chewing tobacco, or electronic cigarettes. If you need help quitting, ask your health care provider. Contact a health care provider if:  You have increased bruising or swelling.  You have pain that is not controlled with treatment.  You have a fever. Get help right away if:  You have difficulty breathing or shortness of breath.  You develop a continual cough, or you cough up thick or bloody sputum.  You feel sick to your stomach (nauseous), you throw up (vomit), or you have abdominal pain. This information is not intended to replace advice given to you by your health care provider. Make sure you discuss any questions you have with your health care provider. Document Released: 01/10/2001 Document Revised: 09/23/2015 Document Reviewed: 01/27/2014 Elsevier Interactive Patient Education  2018 [employer].     IF you received an x-ray today, you will receive an invoice from [facility]. Please contact [facility] at [phone] with questions or concerns regarding your invoice.   IF you received labwork today, you will receive an invoice from [facility]. Please contact [facility] at [phone] with questions or concerns regarding your invoice.   Our billing staff will not be able to assist you with questions regarding bills from these companies.  You will be contacted with the lab results as soon as they are available. The fastest way to get your results is to activate your My Chart account. Instructions are located on the last page of this paperwork. If you have not heard from [place] regarding the results in 2 weeks, please contact this office.

## 2017-06-15 NOTE — Progress Notes (Signed)
Subjective:  By signing my name below, I, Rachel Morris, attest that this documentation has been prepared under the direction and in the presence of Rachel Ray, MD. Electronically Signed: Moises Morris, [place]. 06/15/2017 , 4:42 PM .  Patient was seen in Room 14 .   Patient ID: Rachel Morris, female    DOB: 12-22-1930, 84 y.o.   MRN: [id number] Chief Complaint  Patient presents with  . Fall    patient fell x2 days ago and having right side chest/rib pain; states it hurts to breathe  . Hip Pain    on right side; has brusing   HPI Rachel Morris is a 84 y.o. female  Patient was having her apartment checked for bed bugs 2 days ago. They lifted up her bed spread back to show her the bugs had been gone. She mentions having a bed with posts on the ends. So when she turned, she believes her feet got tangled in the bed spread that was left on the ground. She fell onto one of the bed posts, hitting her right chest flank into her right ribs, and then hitting her right hip onto the carpeted floor. She noticed bruising over her right hip. She also mentions bumping her head, but denies any LOC. She denies change in vision, new headaches, dizziness, nausea or vomiting.   She has a history of right hip replacement in Aug 2017. She was doing well until about 2 weeks ago when cleaning snow off of her car, and feeling some tingling down her right leg.   She reports having chest wall pain since that time. She's applied ice and taken tylenol yesterday and today with some relief. She was able to attend [employer] class yesterday without any pain. She denies shortness of breath, cough or hematuria.   She had an xray of left sided chest in 2015, no apparent rib fractures, suspected rib contusions.   Patient Active Problem List   Diagnosis Date Noted  . Trochanteric bursitis, right hip 05/29/2017  . Anxiety 04/16/2017  . Alcohol use 04/16/2017  . Primary insomnia 04/16/2017  . Presence of pessary  10/16/2016  . Status post total replacement of right hip 12/10/2015  . Osteoarthritis of right hip 10/15/2015  . Senile osteoporosis 09/30/2012  . Reflux esophagitis 09/30/2012  . Hyperlipidemia with target LDL less than 100   . Allergic rhinitis due to pollen   . Cystocele with small rectocele and uterine descent 09/09/2012  . Vaginal atrophy 09/09/2012   Past Medical History:  Diagnosis Date  . Allergic rhinitis due to pollen   . Anxiety state, unspecified   . Arthritis    RIGHT LEG, HANDS.  [name] Cancer (HCC)    FACE/NOSE -SKIN-squamous cell and basal cell.  . Disorder of bone and cartilage, unspecified   . Lumbago   . Lyme borreliosis   . Other atopic dermatitis and related conditions   . Other dysphagia   . Reflux esophagitis   . Senile osteoporosis   . TMJ dysfunction    MORE ON LEFT "WEARS BITE PROTECTION AT NIGHT"   Past Surgical History:  Procedure Laterality Date  . APPENDECTOMY  1950  . CATARACT EXTRACTION, BILATERAL Bilateral   . EYE SURGERY Right    right eye ptrigium excision x2  . HERNIA REPAIR Bilateral   . TOTAL HIP ARTHROPLASTY Right 12/10/2015   Procedure: RIGHT TOTAL HIP ARTHROPLASTY ANTERIOR APPROACH;  Surgeon: [name], MD;  Location: [facility];  Service: Orthopedics;  Laterality: Right;  Spinal to General   Allergies  Allergen Reactions  . Brandy [Alcohol] Anaphylaxis    Related to grapes  . Prune Anaphylaxis    PLUMS,GRAPES,RAISINS  . Aspirin Other (See Comments)    Stomach bleed  . Augmentin [Amoxicillin-Pot Clavulanate] Hives and Itching  . Clindamycin Other (See Comments)    unkown  . Omnicef [Cefdinir] Hives  . Vinegar [Acetic Acid]     basalmic  . Alprazolam Rash  . Ceftin [Cefuroxime Axetil] Rash  . Doxycycline Rash  . Eryc [Erythromycin] Rash  . Penicillins Rash   Prior to Admission medications   Medication Sig Start Date End Date Taking? Authorizing Provider  acetaminophen (TYLENOL) 325 MG tablet Take 650 mg by mouth  every 6 (six) hours as needed for mild pain.   Yes [provider]  cholecalciferol (VITAMIN D) 1000 UNITS tablet Take 1,000 Units by mouth daily.   Yes [provider]  Probiotic Product (PROBIOTIC-10 PO) Take 1 tablet by mouth daily.   Yes [provider]   Social History   Socioeconomic History  . Marital status: Widowed    Spouse name: Not on file  . Number of children: Not on file  . Years of education: Not on file  . Highest education level: Not on file  Social Needs  . Financial resource strain: Not on file  . Food insecurity - worry: Not on file  . Food insecurity - inability: Not on file  . Transportation needs - medical: Not on file  . Transportation needs - non-medical: Not on file  Occupational History  . Not on file  Tobacco Use  . Smoking status: Never Smoker  . Smokeless tobacco: Never Used  Substance and Sexual Activity  . Alcohol use: Yes    Alcohol/week: 0.0 oz    Comment:  1 shots at bedtime  . Drug use: No  . Sexual activity: No  Other Topics Concern  . Not on file  Social History Narrative   Admitted to [facility] 12/13/15    Widowed   Never smoked   Alcohol none   DNR, POA   Review of Systems  Constitutional: Negative for chills, fatigue, fever and unexpected weight change.  Respiratory: Negative for cough and shortness of breath.   Gastrointestinal: Negative for constipation, diarrhea, nausea and vomiting.  Genitourinary: Negative for hematuria.  Musculoskeletal: Positive for arthralgias.       Chest wall pain (right sided)  Skin: Negative for rash and wound.       Bruising (right hip)  Neurological: Negative for dizziness, weakness and headaches.       Objective:   Physical Exam  Constitutional: She is oriented to person, place, and time. She appears well-developed and well-nourished. No distress.  HENT:  Head: Normocephalic and atraumatic.  Minimal soft tissue pain over occiput, no swelling, no  ecchymosis, no focal midline bony tenderness of cervical spine  Eyes: EOM are normal. Pupils are equal, round, and reactive to light.  Neck: Neck supple.  Cardiovascular: Normal rate.  Pulmonary/Chest: Effort normal. No respiratory distress.  Lungs: clear, equal bilaterally Chest wall: tender along lower right rib margin mid axillary line, no associated ecchymosis, no subcutaneous emphysema  Abdominal: There is no CVA tenderness.  Musculoskeletal: Normal range of motion.  Right hip: ecchymosis along right lateral thigh, pain free internal and external rotation of right hip, pain free trochanter bursa in area of ecchymosis, pain free weight bearing of right leg  Neurological: She is alert and oriented to  person, place, and time.  Skin: Skin is warm and dry.  Psychiatric: She has a normal mood and affect. Her behavior is normal.  Nursing note and vitals reviewed.   Vitals:   06/15/17 1521  BP: (!) 150/84  Pulse: (!) 104  Resp: 18  Temp: 98.2 F (36.8 C)  TempSrc: Oral  SpO2: 97%  Weight: 134 lb 6.4 oz (61 kg)  Height: 5\' 4"  (1.626 m)   Dg Ribs Unilateral W/chest Right  Result Date: 06/15/2017 CLINICAL DATA:  Right rib pain. EXAM: RIGHT RIBS AND CHEST - 3+ VIEW COMPARISON:  Chest x-Morris and rib x-rays dated 09/05/2013 FINDINGS: No fracture or other bone lesions are seen involving the ribs. There is no evidence of pneumothorax or pleural effusion. Both lungs are clear except for minimal scarring at the right lung base laterally and at the lung apices. Heart size and mediastinal contours are within normal limits. IMPRESSION: No significant abnormality. Specifically, no evidence of rib fracture or rib destruction. Electronically Signed   By: Lorriane Shire M.D.   On: 06/15/2017 17:00   Dg Hip Unilat W Or W/o Pelvis 2-3 Views Right  Result Date: 06/15/2017 CLINICAL DATA:  Right hip pain. EXAM: DG HIP (WITH OR WITHOUT PELVIS) 2-3V RIGHT COMPARISON:  Radiographs dated 05/29/2017 and  12/10/2015 FINDINGS: There is no evidence of hip fracture or dislocation. The components of the total hip prosthesis are intact and properly located with no evidence of loosening. No significant abnormality of the visualized pelvic bones. IMPRESSION: No acute abnormality.  No change. Electronically Signed   By: Lorriane Shire M.D.   On: 06/15/2017 17:02       Assessment & Plan:   KYLAR SPEELMAN is a 84 y.o. female Fall in home, initial encounter  Rib pain on right side - Plan: DG Ribs Unilateral W/Chest Right  Right hip pain - Plan: DG HIP UNILAT W OR W/O PELVIS 2-3 VIEWS RIGHT  Contusion of right lower extremity, initial encounter  Contusion of head, unspecified part of head, initial encounter  Accidental fall at home, subsequent rib pain likely from rib contusion, lateral right hip pain with contusion and subsequent hematoma, contusion of posterior head without any soft tissue findings or new neurologic findings. She is not on Morris thinners. X-rays as above reassuring.   -Symptomatic care discussed and handouts given, RTC precautions. No orders of the defined types were placed in this encounter.  Patient Instructions    Tylenol for pain, ice over affected areas. Gentle range of motion stretches, and try to maintain deep breathing at times to lessen risk for pneumonias or fluid collection within the lungs. If worsening pain, return for recheck.  If any new or worsening headaches, dizziness, or other new symptoms after head injury, return for evaluation here or emergency room    Head Injury, Adult There are many types of head injuries. Head injuries can be as minor as a bump, or they can be more severe. More severe head injuries include:  A jarring injury to the brain (concussion).  A bruise of the brain (contusion). This means there is bleeding in the brain that can cause swelling.  A cracked skull (skull fracture).  Bleeding in the brain that collects, clots, and forms a  bump (hematoma).  After a head injury, you may need to be observed for a while in the emergency department or urgent care. Sometimes admission to the hospital is needed. After a head injury has happened, most problems occur within the first  24 hours, but side effects may occur up to 7-10 days after the injury. It is important to watch your condition for any changes. What are the causes? There are many possible causes of a head injury. A serious head injury may happen to someone who is in a car accident (motor vehicle collision). Other causes of major head injuries include bicycle or motorcycle accidents, sports injuries, and falls. Risk factors This condition is more likely to occur in people who:  Drink a lot of alcohol or use drugs.  Are over the age of 50.  Are at risk for falls.  What are the symptoms? There are many possible symptoms of a head injury. Visible symptoms of a head injury include a bruise, bump, or bleeding at the site of the injury. Other non-visible symptoms include:  Feeling sleepy or not being able to stay awake.  Passing out.  Headache.  Seizures.  Dizziness.  Confusion.  Memory problems.  Nausea or vomiting.  Other possible symptoms that may develop after the head injury include:  Poor attention and concentration.  Fatigue or tiring easily.  Irritability.  Being uncomfortable around bright lights or loud noises.  Anxiety or depression.  Disturbed sleep.  How is this diagnosed? This condition can usually be diagnosed based on your symptoms, a description of the injury, and a physical exam. You may also have imaging tests done, such as a CT scan or MRI. You will also be closely watched. How is this treated? Treatment for this condition depends on the severity and type of injury you have. The main goal of treatment is to prevent complications and allow the brain time to heal. For mild head injury, you may be sent home and treatment may  include:  Observation. A responsible adult should stay with you for 24 hours after your injury and check on you often.  Physical rest.  Brain rest.  Pain medicines.  For severe brain injury, treatment may include:  Close observation. This includes hospitalization with frequent physical exams. You may need to go to a hospital that specializes in head injury.  Pain medicines.  Breathing support. This may include using a ventilator.  Managing the pressure inside the brain (intracranial pressure, or ICP). This may include: ? Monitoring the ICP. ? Giving medicines to decrease the ICP. ? Positioning you to decrease the ICP.  Medicine to prevent seizures.  Surgery to stop bleeding or to remove Morris clots (craniotomy).  Surgery to remove part of the skull (decompressive craniectomy). This allows room for the brain to swell.  Follow these instructions at home: Activity  Rest as much as possible and avoid activities that are physically hard or tiring.  Make sure you get enough sleep.  Limit activities that require a lot of thought or attention, such as: ? Watching TV. ? Playing memory games and puzzles. ? Job-related work or homework. ? Working on [employer], and texting.  Avoid activities that could cause another head injury, such as playing sports, until your health care provider approves. Having another head injury, especially before the first one has healed, can be dangerous.  Ask your health care provider when it is safe for you to return to your regular activities, including work or school. Ask your health care provider for a step-by-step plan for gradually returning to activities.  Ask your health care provider when you can drive, ride a bicycle, or use heavy machinery. Your ability to react may be slower after a brain injury. Never  do these activities if you are dizzy.  Lifestyle  Do not drink alcohol until your health care provider approves, and avoid  drug use. Alcohol and certain drugs may slow your recovery and can put you at risk of further injury.  If it is harder than usual to remember things, write them down.  If you are easily distracted, try to do one thing at a time.  Talk with family members or close friends when making important decisions.  Tell your friends, family, a trusted colleague, and work Fre[employer] forward[employer] about your injury, symptoms, and restrictions. Have them watch for any new or worsening problems.  General instructions  Take over-the-counter and prescription medicines only as told by your health care provider.  Have someone stay with you for 24 hours after your head injury. This person should watch you for any changes in your symptoms and be ready to seek medical help, as needed.  Keep all follow-up visits as told by your health care provider. This is important.  Prevention  Work on improving your balance and strength to avoid falls.  Wear a seatbelt when you are in a moving vehicle.  Wear a helmet when riding a bicycle, skiing, or doing any other sport or activity that has a risk of injury.  Drink alcohol only in moderation.  Take safety measures in your home, such as: ? Removing clutter and tripping hazards from floors and stairways. ? Using grab bars in bathrooms and handrails by stairs. ? Placing non-slip mats on floors and in bathtubs. ? Improving lighting in dim areas. Get help right away if:  You have: ? A severe headache that is not helped by medicine. ? Trouble walking, have weakness in your arms and legs, or lose your balance. ? Clear or bloody fluid coming from your nose or ears. ? Changes in your vision. ? A seizure.  You vomit.  Your symptoms get worse.  Your speech is slurred.  You pass out.  You are sleepier and have trouble staying awake.  Your pupils change size. These symptoms may represent a serious problem that is an emergency. Do not wait to see if the symptoms will go away.  Get medical help right away. Call your local emergency services (911 in the [place] Do not drive yourself to the hospital. This information is not intended to replace advice given to you by your health care provider. Make sure you discuss any questions you have with your health care provider. Document Released: 04/17/2005 Document Revised: 11/12/2015 Document Reviewed: 10/26/2015 Elsevier Interactive Patient Education  2017 [place] A contusion is a deep bruise. Contusions are the result of a blunt injury to tissues and muscle fibers under the skin. The injury causes bleeding under the skin. The skin overlying the contusion may turn blue, purple, or yellow. Minor injuries will give you a painless contusion, but more severe contusions may stay painful and swollen for a few weeks. What are the causes? This condition is usually caused by a blow, trauma, or direct force to an area of the body. What are the signs or symptoms? Symptoms of this condition include:  Swelling of the injured area.  Pain and tenderness in the injured area.  Discoloration. The area may have redness and then turn blue, purple, or yellow.  How is this diagnosed? This condition is diagnosed based on a physical exam and medical history. An X-Morris, CT scan, or MRI may be needed to determine if there are any associated injuries, such as  broken bones (fractures). How is this treated? Specific treatment for this condition depends on what area of the body was injured. In general, the best treatment for a contusion is resting, icing, applying pressure to (compression), and elevating the injured area. This is often called the RICE strategy. Over-the-counter anti-inflammatory medicines may also be recommended for pain control. Follow these instructions at home:  Rest the injured area.  If directed, apply ice to the injured area: ? Put ice in a plastic bag. ? Place a towel between your skin and the bag. ? Leave the  ice on for 20 minutes, 2-3 times per day.  If directed, apply light compression to the injured area using an elastic bandage. Make sure the bandage is not wrapped too tightly. Remove and reapply the bandage as directed by your health care provider.  If possible, raise (elevate) the injured area above the level of your heart while you are sitting or lying down.  Take over-the-counter and prescription medicines only as told by your health care provider. Contact a health care provider if:  Your symptoms do not improve after several days of treatment.  Your symptoms get worse.  You have difficulty moving the injured area. Get help right away if:  You have severe pain.  You have numbness in a hand or foot.  Your hand or foot turns pale or cold. This information is not intended to replace advice given to you by your health care provider. Make sure you discuss any questions you have with your health care provider. Document Released: 01/25/2005 Document Revised: 08/26/2015 Document Reviewed: 09/02/2014 Elsevier Interactive Patient Education  2018 [place].  Rib Contusion A rib contusion is a deep bruise on your rib area. Contusions are the result of a blunt trauma that causes bleeding and injury to the tissues under the skin. A rib contusion may involve bruising of the ribs and of the skin and muscles in the area. The skin overlying the contusion may turn blue, purple, or yellow. Minor injuries will give you a painless contusion, but more severe contusions may stay painful and swollen for a few weeks. What are the causes? A contusion is usually caused by a blow, trauma, or direct force to an area of the body. This often occurs while playing contact sports. What are the signs or symptoms?  Swelling and redness of the injured area.  Discoloration of the injured area.  Tenderness and soreness of the injured area.  Pain with or without movement. How is this diagnosed? The diagnosis  can be made by taking a medical history and performing a physical exam. An X-Morris, CT scan, or MRI may be needed to determine if there were any associated injuries, such as broken bones (fractures) or internal injuries. How is this treated? Often, the best treatment for a rib contusion is rest. Icing or applying cold compresses to the injured area may help reduce swelling and inflammation. Deep breathing exercises may be recommended to reduce the risk of partial lung collapse and pneumonia. Over-the-counter or prescription medicines may also be recommended for pain control. Follow these instructions at home:  Apply ice to the injured area: ? Put ice in a plastic bag. ? Place a towel between your skin and the bag. ? Leave the ice on for 20 minutes, 2-3 times per day.  Take medicines only as directed by your health care provider.  Rest the injured area. Avoid strenuous activity and any activities or movements that cause pain. Be careful during activities  and avoid bumping the injured area.  Perform deep-breathing exercises as directed by your health care provider.  Do not lift anything that is heavier than 5 lb (2.3 kg) until your health care provider approves.  Do not use any tobacco products, including cigarettes, chewing tobacco, or electronic cigarettes. If you need help quitting, ask your health care provider. Contact a health care provider if:  You have increased bruising or swelling.  You have pain that is not controlled with treatment.  You have a fever. Get help right away if:  You have difficulty breathing or shortness of breath.  You develop a continual cough, or you cough up thick or bloody sputum.  You feel sick to your stomach (nauseous), you throw up (vomit), or you have abdominal pain. This information is not intended to replace advice given to you by your health care provider. Make sure you discuss any questions you have with your health care provider. Document  Released: 01/10/2001 Document Revised: 09/23/2015 Document Reviewed: 01/27/2014 Elsevier Interactive Patient Education  2018 [employer].     IF you received an x-Morris today, you will receive an invoice from [facility]. Please contact [facility] at [phone] with questions or concerns regarding your invoice.   IF you received labwork today, you will receive an invoice from [facility]. Please contact [facility] at [phone] with questions or concerns regarding your invoice.   Our billing staff will not be able to assist you with questions regarding bills from these companies.  You will be contacted with the lab results as soon as they are available. The fastest way to get your results is to activate your My Chart account. Instructions are located on the last page of this paperwork. If you have not heard from [place] regarding the results in 2 weeks, please contact this office.       I personally performed the services described in this documentation, which was scribed in my presence. The recorded information has been reviewed and considered for accuracy and completeness, addended by me as needed, and agree with information above.  Signed,   Rachel Ray, MD [facility].  06/16/17 10:50 PM

## 2017-06-16 ENCOUNTER — Encounter: Payer: Self-pay | Admitting: Family Medicine

## 2017-09-25 ENCOUNTER — Ambulatory Visit (INDEPENDENT_AMBULATORY_CARE_PROVIDER_SITE_OTHER): Payer: Medicare Other | Admitting: Women's Health

## 2017-09-25 ENCOUNTER — Encounter: Payer: Self-pay | Admitting: Women's Health

## 2017-09-25 VITALS — BP 148/80

## 2017-09-25 DIAGNOSIS — N898 Other specified noninflammatory disorders of vagina: Secondary | ICD-10-CM

## 2017-09-25 DIAGNOSIS — R309 Painful micturition, unspecified: Secondary | ICD-10-CM | POA: Diagnosis not present

## 2017-09-25 LAB — WET PREP FOR TRICH, YEAST, CLUE

## 2017-09-25 NOTE — Progress Notes (Signed)
84 year old [place], P2 presents with complaint of "wobbly feeling", unsure if she may be having a UTI or fatigue.  Some vaginal discomfort, questionable bladder pressure.  No falls, denies vaginal itching but has noticed some discharge.  Denies back/abdominal pain, pain or burning with urination or fever.  Has a ring with support pessary that she removes and replaces herself every 2 to 3 months.  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  Medical problems include osteoarthritis has had a hip replacement.  Numerous allergies.  Exam: Appears well.  Well dressed, alert and oriented, abdomen soft without rebound or tenderness, external genitalia within normal limits, ring with support pessary removed, washed, speculum exam no visible erosion, or irritation, wet prep negative.  Pessary replaced with ease. UA: Trace ketones, +1 leukocytes, +1 blood, 6-10 WBCs, 4-10 RBCs, few bacteria  Pessary maintenance Probable fatigue  Plan: Removed importance of increasing p.o. fluids, urine culture pending.  Over-the-counter Azo cranberry twice daily, UTI prevention discussed.  Reviewed normality of exam and wet prep negative results.  Reviewed importance of increasing rest.

## 2017-09-25 NOTE — Patient Instructions (Signed)
AZO cranberry  Take 2 daily  Urinary Tract Infection, Adult A urinary tract infection (UTI) is an infection of any part of the urinary tract. The urinary tract includes the:  Kidneys.  Ureters.  Bladder.  Urethra.  These organs make, store, and get rid of pee (urine) in the body. Follow these instructions at home:  Take over-the-counter and prescription medicines only as told by your doctor.  If you were prescribed an antibiotic medicine, take it as told by your doctor. Do not stop taking the antibiotic even if you start to feel better.  Avoid the following drinks: ? Alcohol. ? Caffeine. ? Tea. ? Carbonated drinks.  Drink enough fluid to keep your pee clear or pale yellow.  Keep all follow-up visits as told by your doctor. This is important.  Make sure to: ? Empty your bladder often and completely. Do not to hold pee for long periods of time. ? Empty your bladder before and after sex. ? Wipe from front to back after a bowel movement if you are female. Use each tissue one time when you wipe. Contact a doctor if:  You have back pain.  You have a fever.  You feel sick to your stomach (nauseous).  You throw up (vomit).  Your symptoms do not get better after 3 days.  Your symptoms go away and then come back. Get help right away if:  You have very bad back pain.  You have very bad lower belly (abdominal) pain.  You are throwing up and cannot keep down any medicines or water. This information is not intended to replace advice given to you by your health care provider. Make sure you discuss any questions you have with your health care provider. Document Released: 10/04/2007 Document Revised: 09/23/2015 Document Reviewed: 03/08/2015 Elsevier Interactive Patient Education  [employer].

## 2017-09-27 LAB — URINALYSIS, COMPLETE W/RFL CULTURE
BILIRUBIN URINE: NEGATIVE
GLUCOSE, UA: NEGATIVE
Hyaline Cast: NONE SEEN /LPF
NITRITES URINE, INITIAL: NEGATIVE
PH: 5.5 (ref 5.0–8.0)
Protein, ur: NEGATIVE
SPECIFIC GRAVITY, URINE: 1.015 (ref 1.001–1.03)

## 2017-09-27 LAB — URINE CULTURE
MICRO NUMBER:: 90645883
SPECIMEN QUALITY:: ADEQUATE

## 2017-09-27 LAB — CULTURE INDICATED

## 2017-10-11 ENCOUNTER — Other Ambulatory Visit: Payer: Medicare Other

## 2017-10-11 DIAGNOSIS — E7800 Pure hypercholesterolemia, unspecified: Secondary | ICD-10-CM

## 2017-10-11 DIAGNOSIS — Z7289 Other problems related to lifestyle: Secondary | ICD-10-CM

## 2017-10-11 DIAGNOSIS — Z789 Other specified health status: Secondary | ICD-10-CM

## 2017-10-11 LAB — CBC WITH DIFFERENTIAL/PLATELET
Basophils Absolute: 82 cells/uL (ref 0–200)
Basophils Relative: 1.3 %
Eosinophils Absolute: 290 cells/uL (ref 15–500)
Eosinophils Relative: 4.6 %
HCT: 41.1 % (ref 35.0–45.0)
Hemoglobin: 13.9 g/dL (ref 11.7–15.5)
Lymphs Abs: 2312 cells/uL (ref 850–3900)
MCH: 32.7 pg (ref 27.0–33.0)
MCHC: 33.8 g/dL (ref 32.0–36.0)
MCV: 96.7 fL (ref 80.0–100.0)
MPV: 11 fL (ref 7.5–12.5)
Monocytes Relative: 8.6 %
Neutro Abs: 3074 cells/uL (ref 1500–7800)
Neutrophils Relative %: 48.8 %
Platelets: 240 10*3/uL (ref 140–400)
RBC: 4.25 10*6/uL (ref 3.80–5.10)
RDW: 12.6 % (ref 11.0–15.0)
Total Lymphocyte: 36.7 %
WBC mixed population: 542 cells/uL (ref 200–950)
WBC: 6.3 10*3/uL (ref 3.8–10.8)

## 2017-10-11 LAB — LIPID PANEL
Cholesterol: 254 mg/dL — ABNORMAL HIGH (ref <200)
HDL: 99 mg/dL (ref >50)
LDL Cholesterol (Calc): 122 mg/dL (calc) — ABNORMAL HIGH
Non-HDL Cholesterol (Calc): 155 mg/dL (calc) — ABNORMAL HIGH (ref <130)
Total CHOL/HDL Ratio: 2.6 (calc) (ref <5.0)
Triglycerides: 211 mg/dL — ABNORMAL HIGH (ref <150)

## 2017-10-11 LAB — COMPLETE METABOLIC PANEL WITH GFR
AG Ratio: 1.9 (calc) (ref 1.0–2.5)
ALT: 21 U/L (ref 6–29)
AST: 25 U/L (ref 10–35)
Albumin: 4.2 g/dL (ref 3.6–5.1)
Alkaline phosphatase (APISO): 77 U/L (ref 33–130)
BUN: 14 mg/dL (ref 7–25)
CO2: 30 mmol/L (ref 20–32)
Calcium: 10.1 mg/dL (ref 8.6–10.4)
Chloride: 105 mmol/L (ref 98–110)
Creat: 0.75 mg/dL (ref 0.60–0.88)
GFR, Est African American: 85 mL/min/{1.73_m2} (ref >=60)
GFR, Est Non African American: 73 mL/min/{1.73_m2} (ref >=60)
Globulin: 2.2 g/dL (calc) (ref 1.9–3.7)
Glucose, Bld: 72 mg/dL (ref 65–99)
Potassium: 4.1 mmol/L (ref 3.5–5.3)
Sodium: 142 mmol/L (ref 135–146)
Total Bilirubin: 0.4 mg/dL (ref 0.2–1.2)
Total Protein: 6.4 g/dL (ref 6.1–8.1)

## 2017-10-15 ENCOUNTER — Ambulatory Visit (INDEPENDENT_AMBULATORY_CARE_PROVIDER_SITE_OTHER): Payer: Medicare Other | Admitting: Internal Medicine

## 2017-10-15 ENCOUNTER — Encounter: Payer: Self-pay | Admitting: Internal Medicine

## 2017-10-15 VITALS — BP 138/80 | HR 79 | Temp 98.3°F | Ht 64.0 in | Wt 129.0 lb

## 2017-10-15 DIAGNOSIS — M810 Age-related osteoporosis without current pathological fracture: Secondary | ICD-10-CM

## 2017-10-15 DIAGNOSIS — E7800 Pure hypercholesterolemia, unspecified: Secondary | ICD-10-CM

## 2017-10-15 DIAGNOSIS — R2689 Other abnormalities of gait and mobility: Secondary | ICD-10-CM

## 2017-10-15 DIAGNOSIS — Z9181 History of falling: Secondary | ICD-10-CM

## 2017-10-15 DIAGNOSIS — Z789 Other specified health status: Secondary | ICD-10-CM

## 2017-10-15 DIAGNOSIS — M1611 Unilateral primary osteoarthritis, right hip: Secondary | ICD-10-CM | POA: Diagnosis not present

## 2017-10-15 DIAGNOSIS — Z7289 Other problems related to lifestyle: Secondary | ICD-10-CM

## 2017-10-15 NOTE — Patient Instructions (Addendum)
Try doing tai chi more in your apartment.  Continue your regular exercise.    Alcohol Use Education Information about Your Drinking Your score on the Alcohol Use Disorders Identification Test was: AUDIT C:  4 TOTAL AUDIT SCORE:  10.  This score places you in the category of:  Score 0 = Abstainers Score 8-19 = Unhealthy/High Risk Drinkers  Score 1-7 = Low Risk Drinkers Score 20+ = Probable Alcohol Dependence   High Scores (20+) on the Alcohol Use Identification Test Consider becoming involved in a structured program.  You should stop drinking if: . You have tried to cut down before but have not been successful, or  . You suffer from morning shakes during a heavy drinking period, or . You have high blood pressure, or . You are pregnant, or . You have liver disease, or . You are taking medicines that react with alcohol, or . Your alcohol use is affecting your social relationships, or . You have legal consequences like DUIs, or . You call in sick to work, or . You cannot take care of our children, or . Someone close to you says you drink too much    How Much Alcohol is a Drink: Beer: 12 oz. = 1 drink 16 oz. = 1.3 drinks 22 oz. = 2 drinks 40 oz. = 3.3 drinks  Wine: 5 oz. = 1 drink 740 mL (25 oz.) bottle = 5 drinks Malt Liquor: 12 oz. = 1.5 drinks 16 oz. = 2 drinks 22 oz. = 2.5 drinks 40 oz. = 4.5 drinks  80-Proof Spirits - Hard Liquor: 1 shot = 1 drink 1 mixed drink = number of shots Can equal 1-3 drinks   What is Low-risk Drinking? . Have no more than 2 drinks of alcohol per day . Drink no more than 5 days per week . Do not drink alcohol drink alcohol when: - You drive or operate machinery - You are pregnant or breast feeding - You are taking medications that interact with alcohol - You have medical conditions made worse with alcohol - You can stop or control your drinking      Identify Your Triggers for Drinking . Parties . Particular People . Feeling  lonely . Feeling tense . Family problems . Feeling sad . Feeling happy . Feeling bored . After work . Problems sleeping . Criticism . Feelings of failure . After being paid . When others are drinking . In bars . When out for dinner . After arguments . Weekends . Feeling restless . Being in pain   Effects of High-Risk Drinking To the Brain: . Aggressive, irrational behavior . Arguments, violence . Depression, nervousness . Alcohol dependence, memory loss To the Nervous System: . Trembling hands, tingling fingers . Numbness, painful nerves . Impaired sensation leading to falls . Numb tingling toes To Your Lifestyle: . Social, legal, medical problems . Domestic trouble/relationship loss . Job loss & financial problems . Shortened life span . Accidents and death from drunk driving   To the Face: . Premature aging, drinker's nose . Cancer of the throat & mouth To the Body: . Frequent cold . Reduced resistance to infection . Increased risk of pneumonia . Weakness of heart muscle . Heart failure, anemia . Impaired blood clotting . Breast cancer . Vitamin deficiency, bleeding . Severe Inflammation of the stomach . Vomiting, diarrhea, malnutrition . Ulcer, inflammation of the pancreas . Impaired sexual performance . Birth defects, including deformities, retardation, and low birthweight   Ways to Cope Without  Drinking . Go home if you tend to drink after work . Find another activity . Switch to nonalcoholic beverages . Change friends . Join a club . Volunteer . Visit relatives . Plan/take a trip . Go for a walk . Take up a hobby . Listen to music . Talk to a friend . Reading . What would you do if you had no worries about failing?         Good Reasons for Drinking Less . I will live longer - probably 8-10 years. . I will sleep better. . I will be happier. . I will save a lot of money . My relationships will improve. . I will stay younger for  longer. . I will achieve more in my life . There will be a greater chance that I will survive to a healthy old age with no premature damage to my brain.  . I will be better at my job. . I will be less likely to feel depressed and commit suicide (6 times less likely). . I will be less likely to die of heart disease or cancer. . Other people will respect me . I will be less likely to get into trouble with the police. . The possibility that I will die of liver disease will be dramatically reduced (12 times less likely). . It will be less likely that I will die in a car accident (3 times less likely).   Strategies for Cutting Down Keep Track.  Find a way to keep track of how much you drink.  If you make a note of each drink before you drink it, this will help slow you down. Count and Measure.  Know the standard drink sizes.  Ask the bartender or server about the amount of alcohol in a mixed drink. Set Goals.  Decide how many days a week you will drink and how many drinks each day. Pace and Space.  When you do drink, pace yourself.  Have no more than one drink with alcohol per hour.  Alternate "drink spacers" non-alcoholic drinks such as water, soda, or juice with drinks containing alcohol. Include Food.  Don't drink on an empty stomach.  Have some food so the alcohol will be absorbed more slowly into your system.  Avoid Triggers.  Avoid people, places, or activities that have led to drinking in the past.  Certain times of day or feelings may also be triggers.  Make a plan so you will know what you can do instead of drinking. Plan to Handle Urges.  When an urge hits, consider these options:  Remind yourself of your reasons for changing.  Or talk it through with someone you trust. Or get involved with a healthy, distracting activity.  Or, "urge surf" - instead of fighting the feeling, accept it and ride it out, knowing it will soon crest like a wave and pass. Know Your "No".  Have a polite, convincing  "no thanks" for those times when you may be offered a drink and don't want one.  The faster you can say no to these offers, the less likely you are to give in.  If you hesitate, it allows you time to think of excuses to go along.
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
## 2017-10-22 ENCOUNTER — Ambulatory Visit (INDEPENDENT_AMBULATORY_CARE_PROVIDER_SITE_OTHER): Payer: Medicare Other | Admitting: Women's Health

## 2017-10-22 ENCOUNTER — Encounter: Payer: Self-pay | Admitting: Women's Health

## 2017-10-22 VITALS — BP 130/80 | Ht 64.0 in | Wt 132.0 lb

## 2017-10-22 DIAGNOSIS — Z01411 Encounter for gynecological examination (general) (routine) with abnormal findings: Secondary | ICD-10-CM

## 2017-10-22 DIAGNOSIS — Z9189 Other specified personal risk factors, not elsewhere classified: Secondary | ICD-10-CM | POA: Diagnosis not present

## 2017-10-22 DIAGNOSIS — Z466 Encounter for fitting and adjustment of urinary device: Secondary | ICD-10-CM | POA: Diagnosis not present

## 2017-10-22 DIAGNOSIS — N812 Incomplete uterovaginal prolapse: Secondary | ICD-10-CM | POA: Diagnosis not present

## 2017-10-22 NOTE — Progress Notes (Signed)
Rachel Morris 1931-03-13 [id number]    History:    Presents for breast and pelvic exam.  Self-reported normal pap history and normal mammogram history. Postmenopausal on no HRT.  Ring with support pessary good relief of pelvic pressure and  cystocele . Pessary self-care every 2-3 months. Occasional vaginal irritation with light yellow, odorous discharge. Occasional consitipation relieved with Miralax. Drinks 2.5 alcoholic beverages nightly. Seen by [facility] for fall in 06/2017 s/p RLE and head contusion.  Primary care manages vaccines which are current, colonoscopy 2006-.  2017 DEXA T score -1.8 at hip without elevated FRAX declines repeat or medication.  Widowed, not sexually active.  Past medical history, past surgical history, family history and social history were all reviewed and documented in the EPIC chart. Lives alone in senior residential apartment. Very active with residential activities. Tai chi 2 days/week with daily walking. Right anterior hip replacement 11/2015 for OA, having no limitations. Children all doing well. Daughter local, originally from [place].    ROS:  A ROS was performed and pertinent positives and negatives are included.  Exam:  Vitals:   10/22/17 0816  BP: 130/80  Weight: 132 lb (59.9 kg)  Height: 5' 4" (1.626 m)   Body mass index is 22.66 kg/m.   General appearance:  Normal Thyroid:  Symmetrical, normal in size, without palpable masses or nodularity. Respiratory  Auscultation:  Clear without wheezing or rhonchi Cardiovascular  Auscultation:  Regular rate, without rubs, murmurs or gallops  Edema/varicosities:  Not grossly evident Abdominal  Soft,nontender, without masses, guarding or rebound.   Liver/spleen:  No organomegaly noted  Hernia:  None appreciated  Skin  Inspection:  Grossly normal   Breasts: Examined lying and sitting.     Right: Without masses, retractions, discharge or axillary adenopathy.     Left: Without masses, retractions,  discharge or axillary adenopathy. Gentitourinary   Inguinal/mons:  Normal without inguinal adenopathy  External genitalia:  Normal  BUS/Urethra/Skene's glands:  Normal  Vagina:  Atrophy, minimal erythema, no erosion  Cervix:  Normal  Uterus:  Normal in size, shape and contour.  Midline and mobile.   Adnexa/parametria:     Rt: Without masses or tenderness.   Lt: Without masses or tenderness.  Anus and perineum: Normal  Digital rectal exam: Normal sphincter tone without palpated masses or tenderness  Assessment/Plan:  84 y.o. WWF G2P2 presents for breast and  Pelvic exam. Appears well.  Postmenopausal, no HRT no bleeding Pessary maintenance, cystocele Daily alcohol use, fall history Labs-primary care History of squamous and basal skin cancer-has annual dermatology appointment Osteopenia-declines repeat study or medication  Plan: Pessary removed and cleaned, requested to have left out will replace if needed. Continue pessary self-maintenance.  Alcohol use reviewed, encouraged to decrease alcohol to 1 drink/night. SBE's, continue annual screening mammogram.. Labs completed 09/2017. Continue vitamin d supplements 1,000, active lifestyle, regular exercise.. Discussed home safety,  fall prevention, and driving precautions.  New Pap screening guidelines reviewed   Huel Cote [facility], 9:14 AM 10/22/2017

## 2017-10-22 NOTE — Patient Instructions (Signed)
Health Maintenance for Postmenopausal Women Menopause is a normal process in which your reproductive ability comes to an end. This process happens gradually over a span of months to years, usually between the ages of 22 and 9. Menopause is complete when you have missed 12 consecutive menstrual periods. It is important to talk with your health care provider about some of the most common conditions that affect postmenopausal women, such as heart disease, cancer, and bone loss (osteoporosis). Adopting a healthy lifestyle and getting preventive care can help to promote your health and wellness. Those actions can also lower your chances of developing some of these common conditions. What should I know about menopause? During menopause, you may experience a number of symptoms, such as:  Moderate-to-severe hot flashes.  Night sweats.  Decrease in sex drive.  Mood swings.  Headaches.  Tiredness.  Irritability.  Memory problems.  Insomnia.  Choosing to treat or not to treat menopausal changes is an individual decision that you make with your health care provider. What should I know about hormone replacement therapy and supplements? Hormone therapy products are effective for treating symptoms that are associated with menopause, such as hot flashes and night sweats. Hormone replacement carries certain risks, especially as you become older. If you are thinking about using estrogen or estrogen with progestin treatments, discuss the benefits and risks with your health care provider. What should I know about heart disease and stroke? Heart disease, heart attack, and stroke become more likely as you age. This may be due, in part, to the hormonal changes that your body experiences during menopause. These can affect how your body processes dietary fats, triglycerides, and cholesterol. Heart attack and stroke are both medical emergencies. There are many things that you can do to help prevent heart disease  and stroke:  Have your blood pressure checked at least every 1-2 years. High blood pressure causes heart disease and increases the risk of stroke.  If you are 53-22 years old, ask your health care provider if you should take aspirin to prevent a heart attack or a stroke.  Do not use any tobacco products, including cigarettes, chewing tobacco, or electronic cigarettes. If you need help quitting, ask your health care provider.  It is important to eat a healthy diet and maintain a healthy weight. ? Be sure to include plenty of vegetables, fruits, low-fat dairy products, and lean protein. ? Avoid eating foods that are high in solid fats, added sugars, or salt (sodium).  Get regular exercise. This is one of the most important things that you can do for your health. ? Try to exercise for at least 150 minutes each week. The type of exercise that you do should increase your heart rate and make you sweat. This is known as moderate-intensity exercise. ? Try to do strengthening exercises at least twice each week. Do these in addition to the moderate-intensity exercise.  Know your numbers.Ask your health care provider to check your cholesterol and your blood glucose. Continue to have your blood tested as directed by your health care provider.  What should I know about cancer screening? There are several types of cancer. Take the following steps to reduce your risk and to catch any cancer development as early as possible. Breast Cancer  Practice breast self-awareness. ? This means understanding how your breasts normally appear and feel. ? It also means doing regular breast self-exams. Let your health care provider know about any changes, no matter how small.  If you are 40  or older, have a clinician do a breast exam (clinical breast exam or CBE) every year. Depending on your age, family history, and medical history, it may be recommended that you also have a yearly breast X-ray (mammogram).  If you  have a family history of breast cancer, talk with your health care provider about genetic screening.  If you are at high risk for breast cancer, talk with your health care provider about having an MRI and a mammogram every year.  Breast cancer (BRCA) gene test is recommended for women who have family members with BRCA-related cancers. Results of the assessment will determine the need for genetic counseling and BRCA1 and for BRCA2 testing. BRCA-related cancers include these types: ? Breast. This occurs in males or females. ? Ovarian. ? Tubal. This may also be called fallopian tube cancer. ? Cancer of the abdominal or pelvic lining (peritoneal cancer). ? Prostate. ? Pancreatic.  Cervical, Uterine, and Ovarian Cancer Your health care provider may recommend that you be screened regularly for cancer of the pelvic organs. These include your ovaries, uterus, and vagina. This screening involves a pelvic exam, which includes checking for microscopic changes to the surface of your cervix (Pap test).  For women ages 21-65, health care providers may recommend a pelvic exam and a Pap test every three years. For women ages 62-65, they may recommend the Pap test and pelvic exam, combined with testing for human papilloma virus (HPV), every five years. Some types of HPV increase your risk of cervical cancer. Testing for HPV may also be done on women of any age who have unclear Pap test results.  Other health care providers may not recommend any screening for nonpregnant women who are considered low risk for pelvic cancer and have no symptoms. Ask your health care provider if a screening pelvic exam is right for you.  If you have had past treatment for cervical cancer or a condition that could lead to cancer, you need Pap tests and screening for cancer for at least 20 years after your treatment. If Pap tests have been discontinued for you, your risk factors (such as having a new sexual partner) need to be  reassessed to determine if you should start having screenings again. Some women have medical problems that increase the chance of getting cervical cancer. In these cases, your health care provider may recommend that you have screening and Pap tests more often.  If you have a family history of uterine cancer or ovarian cancer, talk with your health care provider about genetic screening.  If you have vaginal bleeding after reaching menopause, tell your health care provider.  There are currently no reliable tests available to screen for ovarian cancer.  Lung Cancer Lung cancer screening is recommended for adults 51-100 years old who are at high risk for lung cancer because of a history of smoking. A yearly low-dose CT scan of the lungs is recommended if you:  Currently smoke.  Have a history of at least 30 pack-years of smoking and you currently smoke or have quit within the past 15 years. A pack-year is smoking an average of one pack of cigarettes per day for one year.  Yearly screening should:  Continue until it has been 15 years since you quit.  Stop if you develop a health problem that would prevent you from having lung cancer treatment.  Colorectal Cancer  This type of cancer can be detected and can often be prevented.  Routine colorectal cancer screening usually begins at  age 17 and continues through age 1.  If you have risk factors for colon cancer, your health care provider may recommend that you be screened at an earlier age.  If you have a family history of colorectal cancer, talk with your health care provider about genetic screening.  Your health care provider may also recommend using home test kits to check for hidden blood in your stool.  A small camera at the end of a tube can be used to examine your colon directly (sigmoidoscopy or colonoscopy). This is done to check for the earliest forms of colorectal cancer.  Direct examination of the colon should be repeated every  5-10 years until age 68. However, if early forms of precancerous polyps or small growths are found or if you have a family history or genetic risk for colorectal cancer, you may need to be screened more often.  Skin Cancer  Check your skin from head to toe regularly.  Monitor any moles. Be sure to tell your health care provider: ? About any new moles or changes in moles, especially if there is a change in a mole's shape or color. ? If you have a mole that is larger than the size of a pencil eraser.  If any of your family members has a history of skin cancer, especially at a young age, talk with your health care provider about genetic screening.  Always use sunscreen. Apply sunscreen liberally and repeatedly throughout the day.  Whenever you are outside, protect yourself by wearing long sleeves, pants, a wide-brimmed hat, and sunglasses.  What should I know about osteoporosis? Osteoporosis is a condition in which bone destruction happens more quickly than new bone creation. After menopause, you may be at an increased risk for osteoporosis. To help prevent osteoporosis or the bone fractures that can happen because of osteoporosis, the following is recommended:  If you are 39-68 years old, get at least 1,000 mg of calcium and at least 600 mg of vitamin D per day.  If you are older than age 66 but younger than age 72, get at least 1,200 mg of calcium and at least 600 mg of vitamin D per day.  If you are older than age 11, get at least 1,200 mg of calcium and at least 800 mg of vitamin D per day.  Smoking and excessive alcohol intake increase the risk of osteoporosis. Eat foods that are rich in calcium and vitamin D, and do weight-bearing exercises several times each week as directed by your health care provider. What should I know about how menopause affects my mental health? Depression may occur at any age, but it is more common as you become older. Common symptoms of depression  include:  Low or sad mood.  Changes in sleep patterns.  Changes in appetite or eating patterns.  Feeling an overall lack of motivation or enjoyment of activities that you previously enjoyed.  Frequent crying spells.  Talk with your health care provider if you think that you are experiencing depression. What should I know about immunizations? It is important that you get and maintain your immunizations. These include:  Tetanus, diphtheria, and pertussis (Tdap) booster vaccine.  Influenza every year before the flu season begins.  Pneumonia vaccine.  Shingles vaccine.  Your health care provider may also recommend other immunizations. This information is not intended to replace advice given to you by your health care provider. Make sure you discuss any questions you have with your health care provider. Document Released: 06/09/2005  Document Revised: 11/05/2015 Document Reviewed: 01/19/2015 Elsevier Interactive Patient Education  2018 [employer]  

## 2017-11-16 ENCOUNTER — Telehealth: Payer: Self-pay | Admitting: Internal Medicine

## 2017-11-16 NOTE — Telephone Encounter (Signed)
I left a message asking the patient to call me at [phone] to schedule AWV with Rachel Morris. Last AWV 10/04/16  VDM (DD)

## 2017-11-28 ENCOUNTER — Ambulatory Visit (INDEPENDENT_AMBULATORY_CARE_PROVIDER_SITE_OTHER): Payer: Medicare Other

## 2017-11-28 VITALS — BP 138/70 | HR 86 | Temp 97.8°F | Ht 64.0 in | Wt 129.0 lb

## 2017-11-28 DIAGNOSIS — Z0000 Encounter for general adult medical examination without abnormal findings: Secondary | ICD-10-CM

## 2017-11-28 MED ORDER — ZOSTER VAC RECOMB ADJUVANTED 50 MCG/0.5ML IM SUSR: 0.5000 mL | Freq: Once | INTRAMUSCULAR | 1 refills | Status: AC

## 2017-11-28 NOTE — Patient Instructions (Addendum)
Rachel Morris , Thank you for taking time to come for your Medicare Wellness Visit. I appreciate your ongoing commitment to your health goals. Please review the following plan we discussed and let me know if I can assist you in the future.   Screening recommendations/referrals: Colonoscopy excluded, over age 82 Mammogram up to date, due 03/31/2019 Bone Density up to date Recommended yearly ophthalmology/optometry visit for glaucoma screening and checkup Recommended yearly dental visit for hygiene and checkup  Vaccinations: Influenza vaccine up to date, due 2019 fall season Pneumococcal vaccine up to date, completed Tdap vaccine up to date, due 05/03/2025 Shingles vaccine due, ordered to [facility] directives: in chart  Conditions/risks identified: Take melatonin 20 minutes before going to bed. Start off at 3mg  and increase to 5mg  and then to 10mg  IF needed  Next appointment: Dr. Mariea Clonts 04/22/2018 @10am              Tyson Dense, RN 12/02/2018 @ 10am   Preventive Care 65 Years and Older, Female Preventive care refers to lifestyle choices and visits with your health care provider that can promote health and wellness. What does preventive care include?  A yearly physical exam. This is also called an annual well check.  Dental exams once or twice a year.  Routine eye exams. Ask your health care provider how often you should have your eyes checked.  Personal lifestyle choices, including:  Daily care of your teeth and gums.  Regular physical activity.  Eating a healthy diet.  Avoiding tobacco and drug use.  Limiting alcohol use.  Practicing safe sex.  Taking low-dose aspirin every day.  Taking vitamin and mineral supplements as recommended by your health care provider. What happens during an annual well check? The services and screenings done by your health care provider during your annual well check will depend on your age, overall health, lifestyle risk factors,  and family history of disease. Counseling  Your health care provider may ask you questions about your:  Alcohol use.  Tobacco use.  Drug use.  Emotional well-being.  Home and relationship well-being.  Sexual activity.  Eating habits.  History of falls.  Memory and ability to understand (cognition).  Work and work Statistician.  Reproductive health. Screening  You may have the following tests or measurements:  Height, weight, and BMI.  Blood pressure.  Lipid and cholesterol levels. These may be checked every 5 years, or more frequently if you are over 28 years old.  Skin check.  Lung cancer screening. You may have this screening every year starting at age 16 if you have a 30-pack-year history of smoking and currently smoke or have quit within the past 15 years.  Fecal occult blood test (FOBT) of the stool. You may have this test every year starting at age 59.  Flexible sigmoidoscopy or colonoscopy. You may have a sigmoidoscopy every 5 years or a colonoscopy every 10 years starting at age 64.  Hepatitis C blood test.  Hepatitis B blood test.  Sexually transmitted disease (STD) testing.  Diabetes screening. This is done by checking your blood sugar (glucose) after you have not eaten for a while (fasting). You may have this done every 1-3 years.  Bone density scan. This is done to screen for osteoporosis. You may have this done starting at age 60.  Mammogram. This may be done every 1-2 years. Talk to your health care provider about how often you should have regular mammograms. Talk with your health care provider  about your test results, treatment options, and if necessary, the need for more tests. Vaccines  Your health care provider may recommend certain vaccines, such as:  Influenza vaccine. This is recommended every year.  Tetanus, diphtheria, and acellular pertussis (Tdap, Td) vaccine. You may need a Td booster every 10 years.  Zoster vaccine. You may need  this after age 39.  Pneumococcal 13-valent conjugate (PCV13) vaccine. One dose is recommended after age 9.  Pneumococcal polysaccharide (PPSV23) vaccine. One dose is recommended after age 106. Talk to your health care provider about which screenings and vaccines you need and how often you need them. This information is not intended to replace advice given to you by your health care provider. Make sure you discuss any questions you have with your health care provider. Document Released: 05/14/2015 Document Revised: 01/05/2016 Document Reviewed: 02/16/2015 Elsevier Interactive Patient Education  2017 [place] Prevention in the Home Falls can cause injuries. They can happen to people of all ages. There are many things you can do to make your home safe and to help prevent falls. What can I do on the outside of my home?  Regularly fix the edges of walkways and driveways and fix any cracks.  Remove anything that might make you trip as you walk through a door, such as a raised step or threshold.  Trim any bushes or trees on the path to your home.  Use bright outdoor lighting.  Clear any walking paths of anything that might make someone trip, such as rocks or tools.  Regularly check to see if handrails are loose or broken. Make sure that both sides of any steps have handrails.  Any raised decks and porches should have guardrails on the edges.  Have any leaves, snow, or ice cleared regularly.  Use sand or salt on walking paths during winter.  Clean up any spills in your garage right away. This includes oil or grease spills. What can I do in the bathroom?  Use night lights.  Install grab bars by the toilet and in the tub and shower. Do not use towel bars as grab bars.  Use non-skid mats or decals in the tub or shower.  If you need to sit down in the shower, use a plastic, non-slip stool.  Keep the floor dry. Clean up any water that spills on the floor as soon as it  happens.  Remove soap buildup in the tub or shower regularly.  Attach bath mats securely with double-sided non-slip rug tape.  Do not have throw rugs and other things on the floor that can make you trip. What can I do in the bedroom?  Use night lights.  Make sure that you have a light by your bed that is easy to reach.  Do not use any sheets or blankets that are too big for your bed. They should not hang down onto the floor.  Have a firm chair that has side arms. You can use this for support while you get dressed.  Do not have throw rugs and other things on the floor that can make you trip. What can I do in the kitchen?  Clean up any spills right away.  Avoid walking on wet floors.  Keep items that you use a lot in easy-to-reach places.  If you need to reach something above you, use a strong step stool that has a grab bar.  Keep electrical cords out of the way.  Do not use floor polish or  wax that makes floors slippery. If you must use wax, use non-skid floor wax.  Do not have throw rugs and other things on the floor that can make you trip. What can I do with my stairs?  Do not leave any items on the stairs.  Make sure that there are handrails on both sides of the stairs and use them. Fix handrails that are broken or loose. Make sure that handrails are as long as the stairways.  Check any carpeting to make sure that it is firmly attached to the stairs. Fix any carpet that is loose or worn.  Avoid having throw rugs at the top or bottom of the stairs. If you do have throw rugs, attach them to the floor with carpet tape.  Make sure that you have a light switch at the top of the stairs and the bottom of the stairs. If you do not have them, ask someone to add them for you. What else can I do to help prevent falls?  Wear shoes that:  Do not have high heels.  Have rubber bottoms.  Are comfortable and fit you well.  Are closed at the toe. Do not wear sandals.  If you  use a stepladder:  Make sure that it is fully opened. Do not climb a closed stepladder.  Make sure that both sides of the stepladder are locked into place.  Ask someone to hold it for you, if possible.  Clearly mark and make sure that you can see:  Any grab bars or handrails.  First and last steps.  Where the edge of each step is.  Use tools that help you move around (mobility aids) if they are needed. These include:  Canes.  Walkers.  Scooters.  Crutches.  Turn on the lights when you go into a dark area. Replace any light bulbs as soon as they burn out.  Set up your furniture so you have a clear path. Avoid moving your furniture around.  If any of your floors are uneven, fix them.  If there are any pets around you, be aware of where they are.  Review your medicines with your doctor. Some medicines can make you feel dizzy. This can increase your chance of falling. Ask your doctor what other things that you can do to help prevent falls. This information is not intended to replace advice given to you by your health care provider. Make sure you discuss any questions you have with your health care provider. Document Released: 02/11/2009 Document Revised: 09/23/2015 Document Reviewed: 05/22/2014 Elsevier Interactive Patient Education  2017 [employer].
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
## 2018-01-31 DIAGNOSIS — Z23 Encounter for immunization: Secondary | ICD-10-CM | POA: Diagnosis not present

## 2018-02-25 ENCOUNTER — Encounter (INDEPENDENT_AMBULATORY_CARE_PROVIDER_SITE_OTHER): Payer: Self-pay | Admitting: Orthopaedic Surgery

## 2018-02-25 ENCOUNTER — Ambulatory Visit (INDEPENDENT_AMBULATORY_CARE_PROVIDER_SITE_OTHER): Payer: Medicare Other

## 2018-02-25 ENCOUNTER — Ambulatory Visit (INDEPENDENT_AMBULATORY_CARE_PROVIDER_SITE_OTHER): Payer: Medicare Other | Admitting: Orthopaedic Surgery

## 2018-02-25 DIAGNOSIS — M5441 Lumbago with sciatica, right side: Secondary | ICD-10-CM

## 2018-02-25 DIAGNOSIS — M7061 Trochanteric bursitis, right hip: Secondary | ICD-10-CM

## 2018-02-25 DIAGNOSIS — Z96641 Presence of right artificial hip joint: Secondary | ICD-10-CM

## 2018-02-25 MED ORDER — METHYLPREDNISOLONE ACETATE 40 MG/ML IJ SUSP
40.0000 mg | INTRAMUSCULAR | Status: AC | PRN
  Administered 2018-02-25: 40 mg via INTRA_ARTICULAR

## 2018-02-25 MED ORDER — LIDOCAINE HCL 1 % IJ SOLN
3.0000 mL | INTRAMUSCULAR | Status: AC | PRN
  Administered 2018-02-25: 3 mL
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
## 2018-02-26 DIAGNOSIS — Z961 Presence of intraocular lens: Secondary | ICD-10-CM | POA: Diagnosis not present

## 2018-02-26 DIAGNOSIS — H04123 Dry eye syndrome of bilateral lacrimal glands: Secondary | ICD-10-CM | POA: Diagnosis not present

## 2018-02-26 DIAGNOSIS — H40053 Ocular hypertension, bilateral: Secondary | ICD-10-CM | POA: Diagnosis not present

## 2018-04-22 ENCOUNTER — Encounter: Payer: Self-pay | Admitting: Internal Medicine

## 2018-04-22 ENCOUNTER — Ambulatory Visit (INDEPENDENT_AMBULATORY_CARE_PROVIDER_SITE_OTHER): Payer: Medicare Other | Admitting: Internal Medicine

## 2018-04-22 VITALS — BP 120/70 | HR 76 | Ht 64.0 in | Wt 128.0 lb

## 2018-04-22 DIAGNOSIS — F5101 Primary insomnia: Secondary | ICD-10-CM

## 2018-04-22 DIAGNOSIS — M1611 Unilateral primary osteoarthritis, right hip: Secondary | ICD-10-CM | POA: Diagnosis not present

## 2018-04-22 DIAGNOSIS — K5901 Slow transit constipation: Secondary | ICD-10-CM | POA: Insufficient documentation

## 2018-04-22 DIAGNOSIS — Z960 Presence of urogenital implants: Secondary | ICD-10-CM

## 2018-04-22 DIAGNOSIS — Z7289 Other problems related to lifestyle: Secondary | ICD-10-CM

## 2018-04-22 DIAGNOSIS — N814 Uterovaginal prolapse, unspecified: Secondary | ICD-10-CM | POA: Diagnosis not present

## 2018-04-22 DIAGNOSIS — Z789 Other specified health status: Secondary | ICD-10-CM

## 2018-04-22 DIAGNOSIS — E7800 Pure hypercholesterolemia, unspecified: Secondary | ICD-10-CM | POA: Diagnosis not present

## 2018-04-22 NOTE — Progress Notes (Signed)
Location:  [facility] clinic Provider:  Aaisha Sliter L. Mariea Clonts, D.O., C.M.D.  Code Status: DNR Goals of Care:  Advanced Directives 11/28/2017  Does Patient Have a Medical Advance Directive? Yes  Type of Advance Directive Out of facility DNR (pink MOST or yellow form);Healthcare Power of Attorney  Does patient want to make changes to medical advance directive? No - Patient declined  Copy of [place] in Chart? Yes  Would patient like information on creating a medical advance directive? -  Pre-existing out of facility DNR order (yellow form or pink MOST form) Yellow form placed in chart (order not valid for inpatient use)     Chief Complaint  Patient presents with  . Medical Management of Chronic Issues    57mth follow-up    HPI: Patient is a 84 y.o. Morris seen today for medical management of chronic diseases.      Office Visit from 04/22/2018 in [facility]  Alcohol Use Disorder Identification Test Final Score (AUDIT)  5    Continues to drink too much at bedtime.  She has cut way back.  She's trying to get down to one drink.  Says she may take a second shot in the middle of the night.  She's been doing this since 36.  She knows it's not good for her.  It's a very strong habit.  She allowed herself to run out last tues.  She got melatonin and took it, but laid there until 4.  Then slept 4-6am.  It gives her diarrhea--she both had loose bms and went often.  Now she's constipated--she took miralax yesterday and 1 senokot today but it was expired.  She is drinking a lot of water.      Minimal pain in her right hip.  Had a shot from orthopedics.  Much better overall since her hip surgery.  She's been sitting watching too many hallmark movies and had not been walking, but doubled her walking to help her bowels.  She had three little bms yesterday.    Managing well with pessary with incontinence.  Had taken a break from it for a little, but back to using it now.  She  continues to eat prepared food at [facility] which contributes to her cholesterol.  LDL 122 and TG elevated at 211.  She loves [facility].    Past Medical History:  Diagnosis Date  . Allergic rhinitis due to pollen   . Anxiety state, unspecified   . Arthritis    RIGHT LEG, HANDS.  [name] Cancer (HCC)    FACE/NOSE -SKIN-squamous cell and basal cell.  . Disorder of bone and cartilage, unspecified   . Lumbago   . Lyme borreliosis   . Other atopic dermatitis and related conditions   . Other dysphagia   . Reflux esophagitis   . Senile osteoporosis   . TMJ dysfunction    MORE ON LEFT "WEARS BITE PROTECTION AT NIGHT"    Past Surgical History:  Procedure Laterality Date  . APPENDECTOMY  1950  . CATARACT EXTRACTION, BILATERAL Bilateral   . EYE SURGERY Right    right eye ptrigium excision x2  . HERNIA REPAIR Bilateral   . TOTAL HIP ARTHROPLASTY Right 12/10/2015   Procedure: RIGHT TOTAL HIP ARTHROPLASTY ANTERIOR APPROACH;  Surgeon: [name], MD;  Location: [facility];  Service: Orthopedics;  Laterality: Right;  Spinal to General    Allergies  Allergen Reactions  . Brandy [Alcohol] Anaphylaxis    Related to grapes  . Prune  Anaphylaxis    PLUMS,GRAPES,RAISINS  . Aspirin Other (See Comments)    Stomach bleed  . Augmentin [Amoxicillin-Pot Clavulanate] Hives and Itching  . Clindamycin Other (See Comments)    unkown  . Omnicef [Cefdinir] Hives  . Vinegar [Acetic Acid]     basalmic  . Alprazolam Rash  . Ceftin [Cefuroxime Axetil] Rash  . Doxycycline Rash  . Eryc [Erythromycin] Rash  . Penicillins Rash    Outpatient Encounter Medications as of 04/22/2018  Medication Sig  . acetaminophen (TYLENOL) 325 MG tablet Take 650 mg by mouth every 6 (six) hours as needed for mild pain.  . cholecalciferol (VITAMIN D) 1000 UNITS tablet Take 1,000 Units by mouth daily.  [name] GARLIC PO Take 1 tablet by mouth daily.  . Probiotic Product (PROBIOTIC-10 PO) Take 1 tablet by mouth daily.   No  facility-administered encounter medications on file as of 04/22/2018.     Review of Systems:  Review of Systems  Constitutional: Negative for chills, fever and malaise/fatigue.  HENT: Positive for hearing loss.   Eyes: Negative for blurred vision.  Respiratory: Negative for cough and shortness of breath.   Cardiovascular: Negative for chest pain, palpitations and leg swelling.  Gastrointestinal: Positive for constipation and diarrhea. Negative for abdominal pain, blood in stool, heartburn, melena, nausea and vomiting.  Genitourinary: Negative for dysuria.       Incontinence managed with pessary  Musculoskeletal: Positive for joint pain. Negative for back pain, falls, myalgias and neck pain.  Skin: Negative for itching and rash.  Neurological: Negative for dizziness and loss of consciousness.  Endo/Heme/Allergies: Does not bruise/bleed easily.  Psychiatric/Behavioral: Positive for memory loss. Negative for depression. The patient is nervous/anxious and has insomnia.        [facility]    Health Maintenance  Topic Date Due  . MAMMOGRAM  03/30/2018  . TETANUS/TDAP  05/03/2025  . INFLUENZA VACCINE  Completed  . DEXA SCAN  Completed  . PNA vac Low Risk Adult  Completed    Physical Exam: Vitals:   04/22/18 1005  BP: 120/70  Pulse: 76  SpO2: 98%  Weight: 128 lb (58.1 kg)  Height: 5\' 4"  (1.626 m)   Body mass index is 21.97 kg/m. Physical Exam Constitutional:      Appearance: Normal appearance.  HENT:     Head: Normocephalic and atraumatic.  Cardiovascular:     Rate and Rhythm: Normal rate and regular rhythm.     Pulses: Normal pulses.     Heart sounds: Normal heart sounds.  Pulmonary:     Effort: Pulmonary effort is normal.     Breath sounds: Normal breath sounds.  Abdominal:     General: Bowel sounds are normal. There is no distension.     Palpations: Abdomen is soft.     Tenderness: There is no abdominal tenderness.  Skin:    General: Skin is warm and dry.     Capillary  Refill: Capillary refill takes less than 2 seconds.  Neurological:     General: No focal deficit present.     Mental Status: She is alert and oriented to person, place, and time.  Psychiatric:     Comments: Jittery today after drinking coffee     Labs reviewed: Basic Metabolic Panel: Recent Labs    10/11/17 0806  NA 142  K 4.1  CL 105  CO2 30  GLUCOSE 72  BUN 14  CREATININE 0.75  CALCIUM 10.1   Liver Function Tests: Recent Labs    10/11/17 0806  AST 25  ALT 21  BILITOT 0.4  PROT 6.4   No results for input(s): LIPASE, AMYLASE in the last 8760 hours. No results for input(s): AMMONIA in the last 8760 hours. CBC: Recent Labs    10/11/17 0806  WBC 6.3  NEUTROABS 3,074  HGB 13.9  HCT 41.1  MCV 96.7  PLT 240   Lipid Panel: Recent Labs    10/11/17 0806  CHOL 254*  HDL 99  LDLCALC 122*  TRIG 211*  CHOLHDL 2.6   Assessment/Plan 1. Slow transit constipation -use miralax and senokot-s when returns home, then get dulcolax supp to have on hand if no success  2. Alcohol use -continue to reduce alcohol--avoid second shot in the middle of the night  3. Primary insomnia -melatonin has not been effective -try chamomile tea  4. Pure hypercholesterolemia -tries to make healthy choices, but is limited to foods at her [place]  5. Primary osteoarthritis of right hip -ongoing, but mild vs pre surgery -cont walking and occasional shots from ortho, tylenol  6. Cystocele with small rectocele and uterine descent -cont pessary use  7. Presence of pessary -cont pessary  Labs/tests ordered:  Orders Placed This Encounter  Procedures  . CBC with Differential/Platelet    Standing Status:   Future    Standing Expiration Date:   04/23/2019  . COMPLETE METABOLIC PANEL WITH GFR    Standing Status:   Future    Standing Expiration Date:   04/23/2019    Next appt:  6 mos, fasting labs at [facility] first  [place]. Devondre Guzzetta, D.O. [facility] [address], [place] [address] Cell Phone (Mon-Fri 8am-5pm):  [phone] On Call:  [phone] & follow prompts after 5pm & weekends Office Phone:  [phone] Office Fax:  [phone]

## 2018-04-22 NOTE — Patient Instructions (Addendum)
Continue to cut down on your night time alcohol  Take another dose of miralax and one more senokot when you get home.  Continue to walk and drink plenty of water.  Pick up a dulcolax suppository to use if those two items are not a success by tomorrow am.

## 2018-05-24 DIAGNOSIS — D2272 Melanocytic nevi of left lower limb, including hip: Secondary | ICD-10-CM | POA: Diagnosis not present

## 2018-05-24 DIAGNOSIS — L570 Actinic keratosis: Secondary | ICD-10-CM | POA: Diagnosis not present

## 2018-05-24 DIAGNOSIS — D224 Melanocytic nevi of scalp and neck: Secondary | ICD-10-CM | POA: Diagnosis not present

## 2018-05-24 DIAGNOSIS — L821 Other seborrheic keratosis: Secondary | ICD-10-CM | POA: Diagnosis not present

## 2018-05-24 DIAGNOSIS — Z23 Encounter for immunization: Secondary | ICD-10-CM | POA: Diagnosis not present

## 2018-05-24 DIAGNOSIS — Z85828 Personal history of other malignant neoplasm of skin: Secondary | ICD-10-CM | POA: Diagnosis not present

## 2018-08-02 ENCOUNTER — Ambulatory Visit (INDEPENDENT_AMBULATORY_CARE_PROVIDER_SITE_OTHER): Payer: Medicare Other | Admitting: Nurse Practitioner

## 2018-08-02 ENCOUNTER — Encounter: Payer: Self-pay | Admitting: Nurse Practitioner

## 2018-08-02 ENCOUNTER — Encounter: Payer: Medicare Other | Admitting: Nurse Practitioner

## 2018-08-02 ENCOUNTER — Other Ambulatory Visit: Payer: Self-pay

## 2018-08-02 DIAGNOSIS — H9201 Otalgia, right ear: Secondary | ICD-10-CM | POA: Diagnosis not present

## 2018-08-02 NOTE — Patient Instructions (Signed)
Continue to use oil routinely 3 times daily  Keep appt with Dr Lyndel Safe.

## 2018-08-02 NOTE — Progress Notes (Signed)
This service is provided via telemedicine  No vital signs collected/recorded due to the encounter was a telemedicine visit.   Location of patient (ex: home, work):  [place] in her apartment  Patient consents to a telephone visit:Yes  Location of the provider (ex: office, home): Office  Name of any referring provider:Luismario Coston NP  Names of all persons participating in the telemedicine service and their role in the encounter: [name],[name], Rachel Morris(patient) Time spent on call:   Patient has ear ache and back pain  that started this morning patient took her temperature and the highest has been 97.3  Virtual Visit via Telephone Note  I connected with Rachel Morris on 08/02/18 at  3:15 PM EDT by telephone and verified that I am speaking with the correct person using two identifiers.   I discussed the limitations, risks, security and privacy concerns of performing an evaluation and management service by telephone and the availability of in person appointments. I also discussed with the patient that there may be a patient responsible charge related to this service. The patient expressed understanding and agreed to proceed.      Careteam: Patient Care Team: [name], DO as PCP - General (Geriatric Medicine) Mast, Man X, NP as Nurse Practitioner (Internal Medicine)  Advanced Directive information Does Patient Have a Medical Advance Directive?: Yes, Type of Advance Directive: Out of facility DNR (pink MOST or yellow form), Does patient want to make changes to medical advance directive?: No - Patient declined  Allergies  Allergen Reactions  . Brandy [Alcohol] Anaphylaxis    Related to grapes  . Prune Anaphylaxis    PLUMS,GRAPES,RAISINS  . Aspirin Other (See Comments)    Stomach bleed  . Augmentin [Amoxicillin-Pot Clavulanate] Hives and Itching  . Clindamycin Other (See Comments)    unkown  . Omnicef [Cefdinir] Hives  .  Vinegar [Acetic Acid]     basalmic  . Alprazolam Rash  . Ceftin [Cefuroxime Axetil] Rash  . Doxycycline Rash  . Eryc [Erythromycin] Rash  . Penicillins Rash    Chief Complaint  Patient presents with  . Acute Visit    back pain, earache started today     HPI: Patient is a 85 y.o. female lives at [facility] [place] and currently in lock down. Reports she got out of bed this morning and right ear was bothering her. This is not uncommon. She has been to an ENT and used a drop for a month but when she stopped the drops it comes back. Saw ENT 5 years ago. She is self medicating it and putting an oil in it which helps and has helped throughout the years. Does not routinely put anything in her ear.  No fevers or drainage from ear.  No abnormal hearing loss.  No redness or heat to the ear.   Reports she is a total wreck due to the COVId-19. Took her temperature 3 times this morning and it is 97.3   Review of Systems:  Review of Systems  Constitutional: Negative for chills, fever and malaise/fatigue.  HENT: Positive for congestion and ear pain. Negative for hearing loss, sinus pain and sore throat.   Psychiatric/Behavioral: The patient is nervous/anxious (due to COVID).     Past Medical History:  Diagnosis Date  . Allergic rhinitis due to pollen   . Anxiety state, unspecified   . Arthritis    RIGHT LEG, HANDS.  [name] Cancer (HCC)    FACE/NOSE -SKIN-squamous cell  and basal cell.  . Disorder of bone and cartilage, unspecified   . Lumbago   . Lyme borreliosis   . Other atopic dermatitis and related conditions   . Other dysphagia   . Reflux esophagitis   . Senile osteoporosis   . TMJ dysfunction    MORE ON LEFT "WEARS BITE PROTECTION AT NIGHT"   Past Surgical History:  Procedure Laterality Date  . APPENDECTOMY  1950  . CATARACT EXTRACTION, BILATERAL Bilateral   . EYE SURGERY Right    right eye ptrigium excision x2  . HERNIA REPAIR Bilateral   . TOTAL HIP ARTHROPLASTY Right  12/10/2015   Procedure: RIGHT TOTAL HIP ARTHROPLASTY ANTERIOR APPROACH;  Surgeon: [name], MD;  Location: [facility];  Service: Orthopedics;  Laterality: Right;  Spinal to General   Social History:   reports that she has never smoked. She has never used smokeless tobacco. She reports current alcohol use. She reports that she does not use drugs.  History reviewed. No pertinent family history.  Medications: Patient's Medications  New Prescriptions   No medications on file  Previous Medications   ACETAMINOPHEN (TYLENOL) 325 MG TABLET    Take 650 mg by mouth every 6 (six) hours as needed for mild pain.   CHOLECALCIFEROL (VITAMIN D) 1000 UNITS TABLET    Take 1,000 Units by mouth daily.   GARLIC PO    Take 1 tablet by mouth daily.   PROBIOTIC PRODUCT (PROBIOTIC-10 PO)    Take 1 tablet by mouth daily.  Modified Medications   No medications on file  Discontinued Medications   No medications on file     Physical Exam:    Labs reviewed: Basic Metabolic Panel: Recent Labs    10/11/17 0806  NA 142  K 4.1  CL 105  CO2 30  GLUCOSE 72  BUN 14  CREATININE 0.75  CALCIUM 10.1   Liver Function Tests: Recent Labs    10/11/17 0806  AST 25  ALT 21  BILITOT 0.4  PROT 6.4   No results for input(s): LIPASE, AMYLASE in the last 8760 hours. No results for input(s): AMMONIA in the last 8760 hours. CBC: Recent Labs    10/11/17 0806  WBC 6.3  NEUTROABS 3,074  HGB 13.9  HCT 41.1  MCV 96.7  PLT 240   Lipid Panel: Recent Labs    10/11/17 0806  CHOL 254*  HDL 99  LDLCALC 122*  TRIG 211*  CHOLHDL 2.6   TSH: No results for input(s): TSH in the last 8760 hours. A1C: No results found for: HGBA1C   Assessment/Plan 1. Right ear pain Chronic issue, slightly worse this morning but better after she applied oil topically with q-tip. Did not insert q-tip into ear canal.  -to continue oil TID and keep follow up next week for evaluation. To notify sooner if needed.    Next appt: 08/07/2018 Carlos American. Harle Battiest  [facility] [phone]    Follow Up Instructions:    I discussed the assessment and treatment plan with the patient. The patient was provided an opportunity to ask questions and all were answered. The patient agreed with the plan and demonstrated an understanding of the instructions.   The patient was advised to call back or seek an in-person evaluation if the symptoms worsen or if the condition fails to improve as anticipated.  I provided 11 minutes of non-face-to-face time during this encounter.

## 2018-08-07 ENCOUNTER — Encounter: Payer: Self-pay | Admitting: Internal Medicine

## 2018-08-07 ENCOUNTER — Other Ambulatory Visit: Payer: Self-pay

## 2018-08-07 ENCOUNTER — Non-Acute Institutional Stay: Payer: Medicare Other | Admitting: Family

## 2018-08-07 ENCOUNTER — Encounter: Payer: Self-pay | Admitting: Family

## 2018-08-07 VITALS — BP 124/70 | HR 93 | Temp 98.4°F | Ht 64.0 in | Wt 130.4 lb

## 2018-08-07 DIAGNOSIS — J301 Allergic rhinitis due to pollen: Secondary | ICD-10-CM

## 2018-08-07 DIAGNOSIS — H9209 Otalgia, unspecified ear: Secondary | ICD-10-CM | POA: Diagnosis not present

## 2018-08-07 MED ORDER — LORATADINE 10 MG PO TABS: 10.0000 mg | ORAL_TABLET | Freq: Every day | ORAL | 11 refills | Status: DC

## 2018-08-07 NOTE — Progress Notes (Signed)
Location:  [facility] (12) Provider: Franki Stemen FNP-C  [name], DO  Patient Care Team: [name], DO as PCP - General (Geriatric Medicine) Mast, Man X, NP as Nurse Practitioner (Internal Medicine)  Extended Emergency Contact Information Primary Emergency Contact: Augustin Schooling [address] [place] Mobile Phone: [phone] Relation: Daughter  Goals of care: Advanced Directive information Advanced Directives 08/02/2018  Does Patient Have a Medical Advance Directive? Yes  Type of Advance Directive Out of facility DNR (pink MOST or yellow form)  Does patient want to make changes to medical advance directive? No - Patient declined  Copy of [place] in Chart? -  Would patient like information on creating a medical advance directive? -  Pre-existing out of facility DNR order (yellow form or pink MOST form) -     Chief Complaint  Patient presents with  . Acute Visit    right ear ache has been using  jojaba oil,left ear bleeding symptoms for years patient has trouble with pollen     HPI:  Pt is a 85 y.o. female seen today at [facility] for an acute visit for evaluation of right ear ache.she states had a telephone visit with [facility] talked with [name] 08/02/2018 for similar symptoms.she states right ear ache has improved since then but left ear has been itching.she states used her Qtip on her left ear and noticed bright red blood on the QTip.she wonders whether her ear is bleeding.she applies few drops of Jojoba oil to both ears twice daily previously recommended by ENT provider.she denies any pain or drainage from ear.she checks her temperature at home has been 97.9 and 98.0 at times.she has been walking outside three times per day and seems to have issues with pollen.Has had issues with runny nose.she denies any ithcy eyes,sneezing,sore throat or cough.  Past Medical  History:  Diagnosis Date  . Allergic rhinitis due to pollen   . Anxiety state, unspecified   . Arthritis    RIGHT LEG, HANDS.  [name] Cancer (HCC)    FACE/NOSE -SKIN-squamous cell and basal cell.  . Disorder of bone and cartilage, unspecified   . Lumbago   . Lyme borreliosis   . Other atopic dermatitis and related conditions   . Other dysphagia   . Reflux esophagitis   . Senile osteoporosis   . TMJ dysfunction    MORE ON LEFT "WEARS BITE PROTECTION AT NIGHT"   Past Surgical History:  Procedure Laterality Date  . APPENDECTOMY  1950  . CATARACT EXTRACTION, BILATERAL Bilateral   . EYE SURGERY Right    right eye ptrigium excision x2  . HERNIA REPAIR Bilateral   . TOTAL HIP ARTHROPLASTY Right 12/10/2015   Procedure: RIGHT TOTAL HIP ARTHROPLASTY ANTERIOR APPROACH;  Surgeon: [name], MD;  Location: [facility];  Service: Orthopedics;  Laterality: Right;  Spinal to General    Allergies  Allergen Reactions  . Brandy [Alcohol] Anaphylaxis    Related to grapes  . Prune Anaphylaxis    PLUMS,GRAPES,RAISINS  . Aspirin Other (See Comments)    Stomach bleed  . Augmentin [Amoxicillin-Pot Clavulanate] Hives and Itching  . Clindamycin Other (See Comments)    unkown  . Omnicef [Cefdinir] Hives  . Vinegar [Acetic Acid]     basalmic  . Alprazolam Rash  . Ceftin [Cefuroxime Axetil] Rash  . Doxycycline Rash  . Eryc [  Erythromycin] Rash  . Penicillins Rash    Outpatient Encounter Medications as of 08/07/2018  Medication Sig  . acetaminophen (TYLENOL) 325 MG tablet Take 650 mg by mouth every 6 (six) hours as needed for mild pain.  . cholecalciferol (VITAMIN D) 1000 UNITS tablet Take 1,000 Units by mouth daily.  [name] GARLIC PO Take 1 tablet by mouth daily.  . [DISCONTINUED] Probiotic Product (PROBIOTIC-10 PO) Take 1 tablet by mouth daily.  [name] loratadine (CLARITIN) 10 MG tablet Take 1 tablet (10 mg total) by mouth daily for 14 days.   No facility-administered encounter medications on file  as of 08/07/2018.     Review of Systems  Constitutional: Negative for appetite change, chills, fatigue, fever and unexpected weight change.  HENT: Positive for rhinorrhea. Negative for congestion, ear discharge, hearing loss, postnasal drip, sinus pressure, sinus pain, sneezing, sore throat and tinnitus.        Ear ache per HPI   Eyes: Positive for visual disturbance. Negative for discharge, redness and itching.       Wears eye glasses   Respiratory: Negative for cough, chest tightness, shortness of breath and wheezing.   Cardiovascular: Negative for chest pain, palpitations and leg swelling.  Skin: Negative for color change, pallor and rash.  Neurological: Negative for dizziness, light-headedness and headaches.  Psychiatric/Behavioral: Negative for agitation and sleep disturbance. The patient is not nervous/anxious.     Immunization History  Administered Date(s) Administered  . Hepatitis B 05/01/2010, 07/31/2010, 11/30/2010  . Influenza Whole 05/01/2010  . Influenza, High Dose Seasonal PF 02/07/2017  . Influenza,inj,Quad PF,6+ Mos 02/06/2013, 01/31/2018  . Influenza-Unspecified 02/15/2012, 02/27/2014, 03/02/2015, 02/10/2016  . PPD Test 04/04/2013  . Pneumococcal Conjugate-13 04/06/2014  . Pneumococcal Polysaccharide-23 05/01/2010  . Td 05/02/2003  . Tdap 05/04/2015  . Zoster 05/01/2010  . Zoster Recombinat (Shingrix) 12/27/2017, 03/14/2018   Pertinent  Health Maintenance Due  Topic Date Due  . MAMMOGRAM  03/30/2018  . INFLUENZA VACCINE  11/30/2018  . DEXA SCAN  Completed  . PNA vac Low Risk Adult  Completed   Fall Risk  08/07/2018 08/02/2018 04/22/2018 11/28/2017 10/15/2017  Falls in the past year? 0 0 0 No No  Number falls in past yr: 0 0 0 - -  Injury with Fall? 0 0 0 - -  Comment - - - - -    Vitals:   08/07/18 1436  BP: 124/70  Pulse: 93  Temp: 98.4 F (36.9 C)  TempSrc: Oral  SpO2: 98%  Weight: 130 lb 6.4 oz (59.1 kg)  Height: 5\' 4"  (1.626 m)   Body mass index is  22.38 kg/m. Physical Exam Vitals signs reviewed.  Constitutional:      General: She is not in acute distress.    Appearance: She is normal weight. She is not ill-appearing.  HENT:     Head: Normocephalic.     Right Ear: Tympanic membrane, ear canal and external ear normal. There is no impacted cerumen.     Ears:     Comments: Left ear partial cerumen TM clear.small area on canal red but no bleeding possible irritation with Qtip.      Nose: Rhinorrhea present. No congestion.     Mouth/Throat:     Mouth: Mucous membranes are moist.     Pharynx: Oropharynx is clear. No oropharyngeal exudate or posterior oropharyngeal erythema.  Eyes:     General: No scleral icterus.       Right eye: No discharge.  Left eye: No discharge.     Conjunctiva/sclera: Conjunctivae normal.     Pupils: Pupils are equal, round, and reactive to light.     Comments: Corrective lens in place.  Neck:     Musculoskeletal: Normal range of motion. No neck rigidity or muscular tenderness.  Cardiovascular:     Rate and Rhythm: Normal rate and regular rhythm.     Pulses: Normal pulses.     Heart sounds: Normal heart sounds. No murmur. No friction rub. No gallop.   Pulmonary:     Effort: Pulmonary effort is normal. No respiratory distress.     Breath sounds: Normal breath sounds. No wheezing, rhonchi or rales.  Chest:     Chest wall: No tenderness.  Lymphadenopathy:     Cervical: No cervical adenopathy.  Skin:    General: Skin is warm and dry.     Coloration: Skin is not pale.     Findings: No erythema.  Neurological:     Mental Status: She is alert and oriented to person, place, and time.     Cranial Nerves: No cranial nerve deficit.     Sensory: No sensory deficit.  Psychiatric:        Mood and Affect: Mood normal.        Behavior: Behavior normal.        Thought Content: Thought content normal.        Judgment: Judgment normal.    Labs reviewed: Recent Labs    10/11/17 0806  NA 142  K 4.1   CL 105  CO2 30  GLUCOSE 72  BUN 14  CREATININE 0.75  CALCIUM 10.1   Recent Labs    10/11/17 0806  AST 25  ALT 21  BILITOT 0.4  PROT 6.4   Recent Labs    10/11/17 0806  WBC 6.3  NEUTROABS 3,074  HGB 13.9  HCT 41.1  MCV 96.7  PLT 240   No results found for: TSH No results found for: HGBA1C Lab Results  Component Value Date   CHOL 254 (H) 10/11/2017   HDL 99 10/11/2017   LDLCALC 122 (H) 10/11/2017   TRIG 211 (H) 10/11/2017   CHOLHDL 2.6 10/11/2017    Significant Diagnostic Results in last 30 days:  No results found.  Assessment/Plan   1. Seasonal allergic rhinitis due to pollen Afebrile.Rhinnorrhea possible due to pollen. - start on Loratadine 10 mg tablet one by mouth daily x 14 days  - encouraged to increase fluid intake.   2. Ear ache  Ear ache has improved since had telephone visit [name].Negative ear exam.continue to monitor for now and notify provider if symptoms worsen.  Family/ staff Communication: Reviewed plan of care with patient.   Labs/tests ordered: None   Lesleigh Hughson C Taray Normoyle, NP

## 2018-08-25 ENCOUNTER — Encounter: Payer: Self-pay | Admitting: Internal Medicine

## 2018-10-21 ENCOUNTER — Other Ambulatory Visit: Payer: Medicare Other

## 2018-10-28 ENCOUNTER — Ambulatory Visit: Payer: Medicare Other | Admitting: Internal Medicine

## 2018-10-31 ENCOUNTER — Other Ambulatory Visit: Payer: Self-pay

## 2018-10-31 DIAGNOSIS — Z789 Other specified health status: Secondary | ICD-10-CM

## 2018-10-31 DIAGNOSIS — Z7289 Other problems related to lifestyle: Secondary | ICD-10-CM

## 2018-11-04 ENCOUNTER — Other Ambulatory Visit: Payer: Medicare Other

## 2018-11-04 ENCOUNTER — Other Ambulatory Visit: Payer: Self-pay

## 2018-11-04 DIAGNOSIS — Z789 Other specified health status: Secondary | ICD-10-CM

## 2018-11-04 DIAGNOSIS — Z7289 Other problems related to lifestyle: Secondary | ICD-10-CM

## 2018-11-04 LAB — CBC WITH DIFFERENTIAL/PLATELET
Absolute Monocytes: 914 cells/uL (ref 200–950)
Basophils Absolute: 94 cells/uL (ref 0–200)
Basophils Relative: 1.3 %
Eosinophils Absolute: 511 cells/uL — ABNORMAL HIGH (ref 15–500)
Eosinophils Relative: 7.1 %
HCT: 40.4 % (ref 35.0–45.0)
Hemoglobin: 13.7 g/dL (ref 11.7–15.5)
Lymphs Abs: 2246 cells/uL (ref 850–3900)
MCH: 33.2 pg — ABNORMAL HIGH (ref 27.0–33.0)
MCHC: 33.9 g/dL (ref 32.0–36.0)
MCV: 97.8 fL (ref 80.0–100.0)
MPV: 10.6 fL (ref 7.5–12.5)
Monocytes Relative: 12.7 %
Neutro Abs: 3434 cells/uL (ref 1500–7800)
Neutrophils Relative %: 47.7 %
Platelets: 267 10*3/uL (ref 140–400)
RBC: 4.13 10*6/uL (ref 3.80–5.10)
RDW: 13.1 % (ref 11.0–15.0)
Total Lymphocyte: 31.2 %
WBC: 7.2 10*3/uL (ref 3.8–10.8)

## 2018-11-04 LAB — COMPLETE METABOLIC PANEL WITH GFR
AG Ratio: 1.6 (calc) (ref 1.0–2.5)
ALT: 22 U/L (ref 6–29)
AST: 31 U/L (ref 10–35)
Albumin: 4.1 g/dL (ref 3.6–5.1)
Alkaline phosphatase (APISO): 78 U/L (ref 37–153)
BUN: 12 mg/dL (ref 7–25)
CO2: 26 mmol/L (ref 20–32)
Calcium: 10.1 mg/dL (ref 8.6–10.4)
Chloride: 105 mmol/L (ref 98–110)
Creat: 0.62 mg/dL (ref 0.60–0.88)
GFR, Est African American: 95 mL/min/{1.73_m2} (ref >=60)
GFR, Est Non African American: 82 mL/min/{1.73_m2} (ref >=60)
Globulin: 2.5 g/dL (calc) (ref 1.9–3.7)
Glucose, Bld: 74 mg/dL (ref 65–99)
Potassium: 4.4 mmol/L (ref 3.5–5.3)
Sodium: 140 mmol/L (ref 135–146)
Total Bilirubin: 0.4 mg/dL (ref 0.2–1.2)
Total Protein: 6.6 g/dL (ref 6.1–8.1)

## 2018-11-06 ENCOUNTER — Encounter: Payer: Self-pay | Admitting: Internal Medicine

## 2018-11-06 ENCOUNTER — Other Ambulatory Visit: Payer: Self-pay

## 2018-11-06 ENCOUNTER — Non-Acute Institutional Stay: Payer: Medicare Other | Admitting: Internal Medicine

## 2018-11-06 VITALS — BP 142/80 | HR 66 | Temp 98.4°F | Ht 64.0 in | Wt 131.8 lb

## 2018-11-06 DIAGNOSIS — Z7289 Other problems related to lifestyle: Secondary | ICD-10-CM | POA: Diagnosis not present

## 2018-11-06 DIAGNOSIS — G250 Essential tremor: Secondary | ICD-10-CM | POA: Diagnosis not present

## 2018-11-06 DIAGNOSIS — F5101 Primary insomnia: Secondary | ICD-10-CM | POA: Diagnosis not present

## 2018-11-06 DIAGNOSIS — Z789 Other specified health status: Secondary | ICD-10-CM

## 2018-11-06 DIAGNOSIS — E7800 Pure hypercholesterolemia, unspecified: Secondary | ICD-10-CM | POA: Diagnosis not present

## 2018-11-06 DIAGNOSIS — K5901 Slow transit constipation: Secondary | ICD-10-CM | POA: Diagnosis not present

## 2018-11-06 DIAGNOSIS — F419 Anxiety disorder, unspecified: Secondary | ICD-10-CM | POA: Diagnosis not present

## 2018-11-06 NOTE — Progress Notes (Signed)
Location:      Place of Service:     Provider:   Code Status: Goals of Care:  Advanced Directives 11/06/2018  Does Patient Have a Medical Advance Directive? Yes  Type of Paramedic of [place];Out of facility DNR (pink MOST or yellow form)  Does patient want to make changes to medical advance directive? No - Patient declined  Copy of [place] in Chart? Yes - validated most recent copy scanned in chart (See row information)  Would patient like information on creating a medical advance directive? -  Pre-existing out of facility DNR order (yellow form or pink MOST form) Yellow form placed in chart (order not valid for inpatient use)     Chief Complaint  Patient presents with  . Medical Management of Chronic Issues    6 month follow up with labs, patient c/o constipation from sitting extended period of time and is taking over the counter and some pain in hip and shoulder   . Quality Metric Gaps    Mammogram    HPI: Patient is a 85 y.o. female seen today for medical management of chronic diseases.   Patient has history of hyper lipidemia, arthritis status post right hip arthroplasty, essential tremors, anxiety and constipation Patient lives in [place].  Is independent in her ADLs and IADLs.  Has a daughter who is in town and is Glass blower/designer. Patient states her pain in right hip is bearable.  She is very active does not use cane or walker. Has had no falls recently.  She does states anxious and has problems with insomnia.  She takes 2 scotches at night and does not want to cut back due to isolation right now. Patient does have some tremors in her hand and had to give up quilting due to it.  She states to get worse when she is nervous.  Past Medical History:  Diagnosis Date  . Allergic rhinitis due to pollen   . Anxiety state, unspecified   . Arthritis    RIGHT LEG, HANDS.  [name] Cancer (HCC)    FACE/NOSE -SKIN-squamous cell and basal cell.   . Disorder of bone and cartilage, unspecified   . Lumbago   . Lyme borreliosis   . Other atopic dermatitis and related conditions   . Other dysphagia   . Reflux esophagitis   . Senile osteoporosis   . TMJ dysfunction    MORE ON LEFT "WEARS BITE PROTECTION AT NIGHT"    Past Surgical History:  Procedure Laterality Date  . APPENDECTOMY  1950  . CATARACT EXTRACTION, BILATERAL Bilateral   . EYE SURGERY Right    right eye ptrigium excision x2  . HERNIA REPAIR Bilateral   . TOTAL HIP ARTHROPLASTY Right 12/10/2015   Procedure: RIGHT TOTAL HIP ARTHROPLASTY ANTERIOR APPROACH;  Surgeon: [name], MD;  Location: [facility];  Service: Orthopedics;  Laterality: Right;  Spinal to General    Allergies  Allergen Reactions  . Brandy [Alcohol] Anaphylaxis    Related to grapes  . Prune Anaphylaxis    PLUMS,GRAPES,RAISINS  . Aspirin Other (See Comments)    Stomach bleed  . Augmentin [Amoxicillin-Pot Clavulanate] Hives and Itching  . Clindamycin Other (See Comments)    unkown  . Omnicef [Cefdinir] Hives  . Vinegar [Acetic Acid]     basalmic  . Alprazolam Rash  . Ceftin [Cefuroxime Axetil] Rash  . Doxycycline Rash  . Eryc [Erythromycin] Rash  . Penicillins Rash    Outpatient Encounter  Medications as of 11/06/2018  Medication Sig  . acetaminophen (TYLENOL) 325 MG tablet Take 650 mg by mouth every 6 (six) hours as needed for mild pain.  . cholecalciferol (VITAMIN D) 1000 UNITS tablet Take 1,000 Units by mouth daily.  [name] GARLIC PO Take 1 tablet by mouth daily.  . [DISCONTINUED] loratadine (CLARITIN) 10 MG tablet Take 1 tablet (10 mg total) by mouth daily for 14 days.   No facility-administered encounter medications on file as of 11/06/2018.     Review of Systems:  Review of Systems  Review of Systems  Constitutional: Negative for activity change, appetite change, chills, diaphoresis, fatigue and fever.  HENT: Negative for mouth sores, postnasal drip, rhinorrhea, sinus pain and  sore throat.   Respiratory: Negative for apnea, cough, chest tightness, shortness of breath and wheezing.   Cardiovascular: Negative for chest pain, palpitations and leg swelling.  Gastrointestinal: Negative for abdominal distention, abdominal pain, constipation, diarrhea, nausea and vomiting.  Genitourinary: Negative for dysuria and frequency.  Musculoskeletal: Negative for arthralgias, joint swelling and myalgias.  Skin: Negative for rash.  Neurological: Negative for dizziness, syncope, weakness, light-headedness and numbness.  Psychiatric/Behavioral: Negative for behavioral problems, confusion     Health Maintenance  Topic Date Due  . MAMMOGRAM  03/30/2018  . INFLUENZA VACCINE  11/30/2018  . TETANUS/TDAP  05/03/2025  . DEXA SCAN  Completed  . PNA vac Low Risk Adult  Completed    Physical Exam: Vitals:   11/06/18 1440  BP: (!) 142/80  Pulse: 66  Temp: 98.4 F (36.9 C)  TempSrc: Oral  SpO2: 100%  Weight: 131 lb 12.8 oz (59.8 kg)  Height: 5\' 4"  (1.626 m)   Body mass index is 22.62 kg/m. Physical Exam  Constitutional: Oriented to person, place, and time. Well-developed and well-nourished.  HENT:  Head: Normocephalic.  Mouth/Throat: Oropharynx is clear and moist.  Eyes: Pupils are equal, round, and reactive to light.  Neck: Neck supple.  Cardiovascular: Normal rate and normal heart sounds.  No murmur heard. Pulmonary/Chest: Effort normal and breath sounds normal. No respiratory distress. No wheezes. She has no rales.  Abdominal: Soft. Bowel sounds are normal. No distension. There is no tenderness. There is no rebound.  Musculoskeletal: No edema.  Lymphadenopathy: none Neurological: Alert and oriented to person, place, and time.  Had Steady Gait. Essential tremors in Both Hands No Rigidity Skin: Skin is warm and dry.  Psychiatric: Normal mood and affect. Behavior is normal. Thought content normal.    Labs reviewed: Basic Metabolic Panel: Recent Labs    10/31/18  1436  NA 140  K 4.4  CL 105  CO2 26  GLUCOSE 74  BUN 12  CREATININE 0.62  CALCIUM 10.1   Liver Function Tests: Recent Labs    10/31/18 1436  AST 31  ALT 22  BILITOT 0.4  PROT 6.6   No results for input(s): LIPASE, AMYLASE in the last 8760 hours. No results for input(s): AMMONIA in the last 8760 hours. CBC: Recent Labs    10/31/18 1436  WBC 7.2  NEUTROABS 3,434  HGB 13.7  HCT 40.4  MCV 97.8  PLT 267   Lipid Panel: No results for input(s): CHOL, HDL, LDLCALC, TRIG, CHOLHDL, LDLDIRECT in the last 8760 hours. No results found for: HGBA1C  Procedures since last visit: No results found.  Assessment/Plan Slow transit constipation - Plan:  Doing well on Senokot   Tremor, essential - Plan:  Supportive care Repeat Exam next visit I do not think it is Parkinson  related  Anxiety - Plan:  Does not want anything for it Continue supportive care  Alcohol use - Plan:  Takes 2 scotch per night Does not  want ot cut back right now due to Pandemic Hyperlipidemia Will discuss again in next visit Right now she wants to try Exercise and dietary Modifications  Labs/tests ordered:   Next appt:  12/03/2018   Total time spent in this patient care encounter was  _40  minutes; greater than 50% of the visit spent counseling patient and staff, reviewing records , Labs and coordinating care for problems addressed at this encounter.

## 2018-11-07 ENCOUNTER — Ambulatory Visit: Payer: Medicare Other | Admitting: Internal Medicine

## 2018-11-12 IMAGING — DX DG HIP (WITH OR WITHOUT PELVIS) 2-3V*R*
3 series · 3 of 3 positions shown · non-contrast
Comparison: Radiographs dated 05/29/2017 and 12/10/2015

CLINICAL DATA: Right hip pain.

EXAM:
DG HIP (WITH OR WITHOUT PELVIS) 2-3V RIGHT

[pelvis ap]
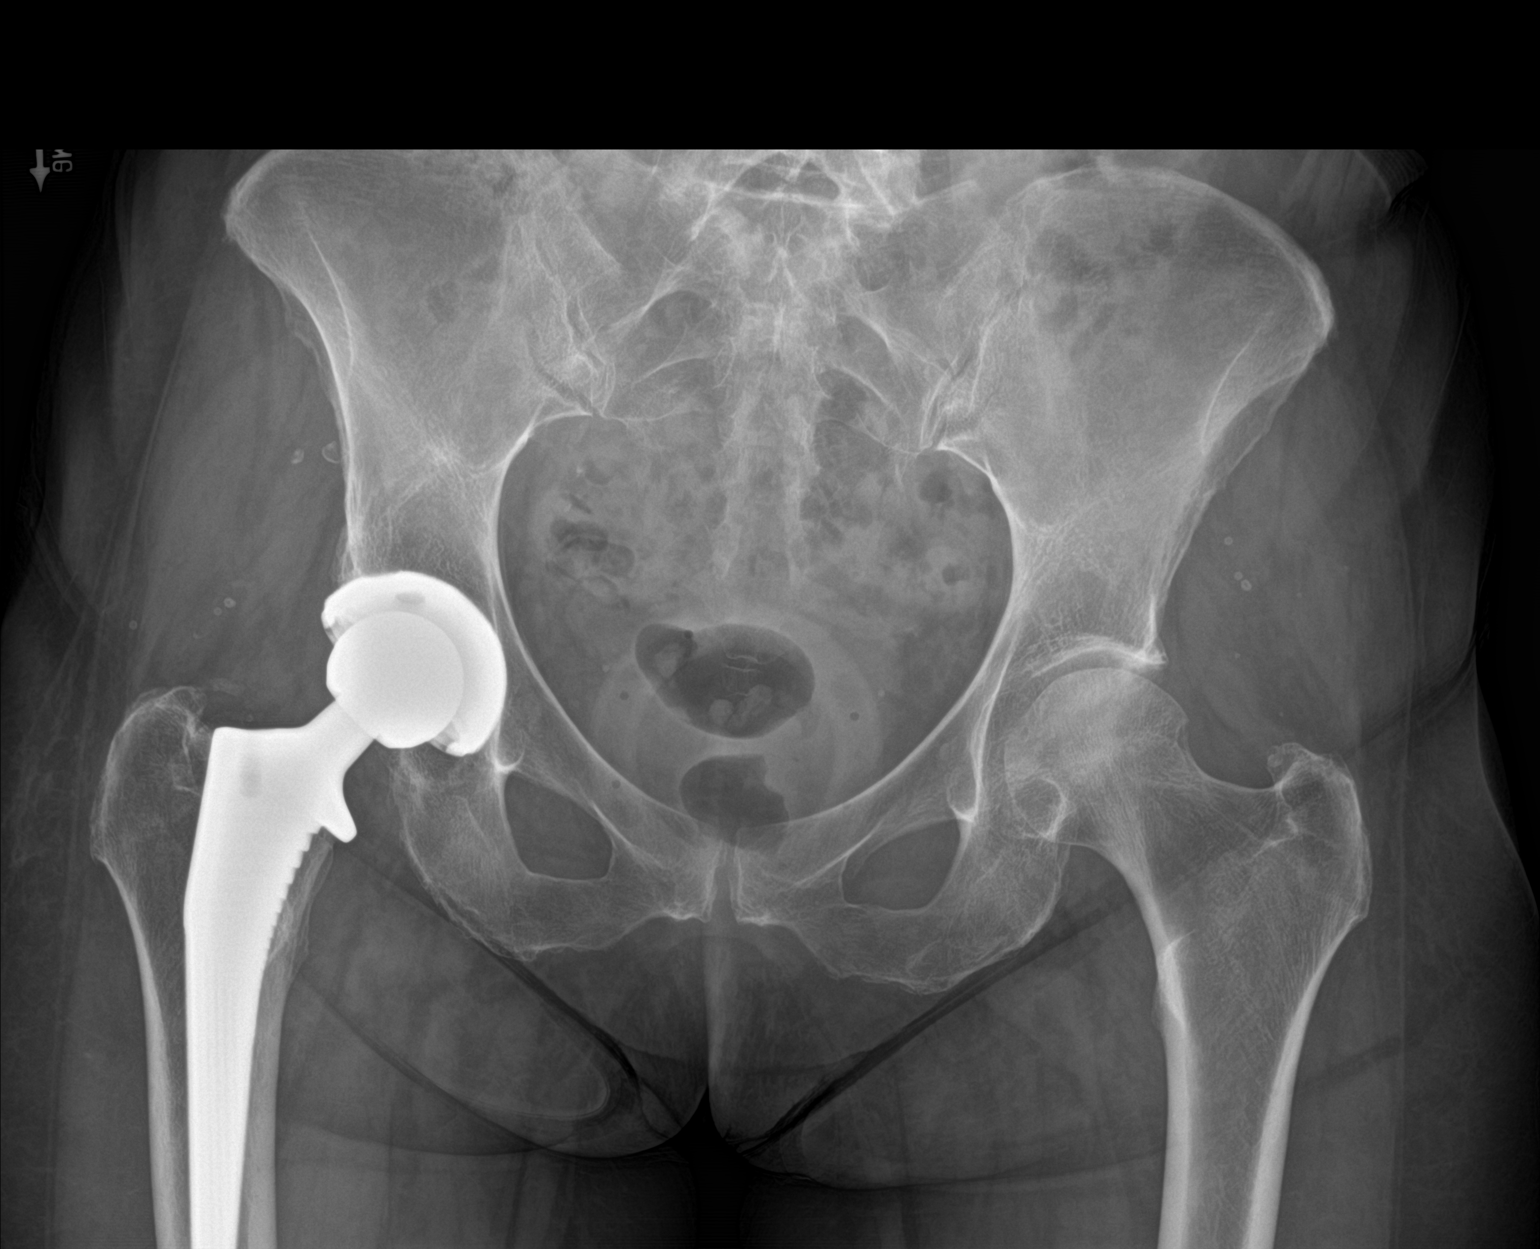

[hip ap]
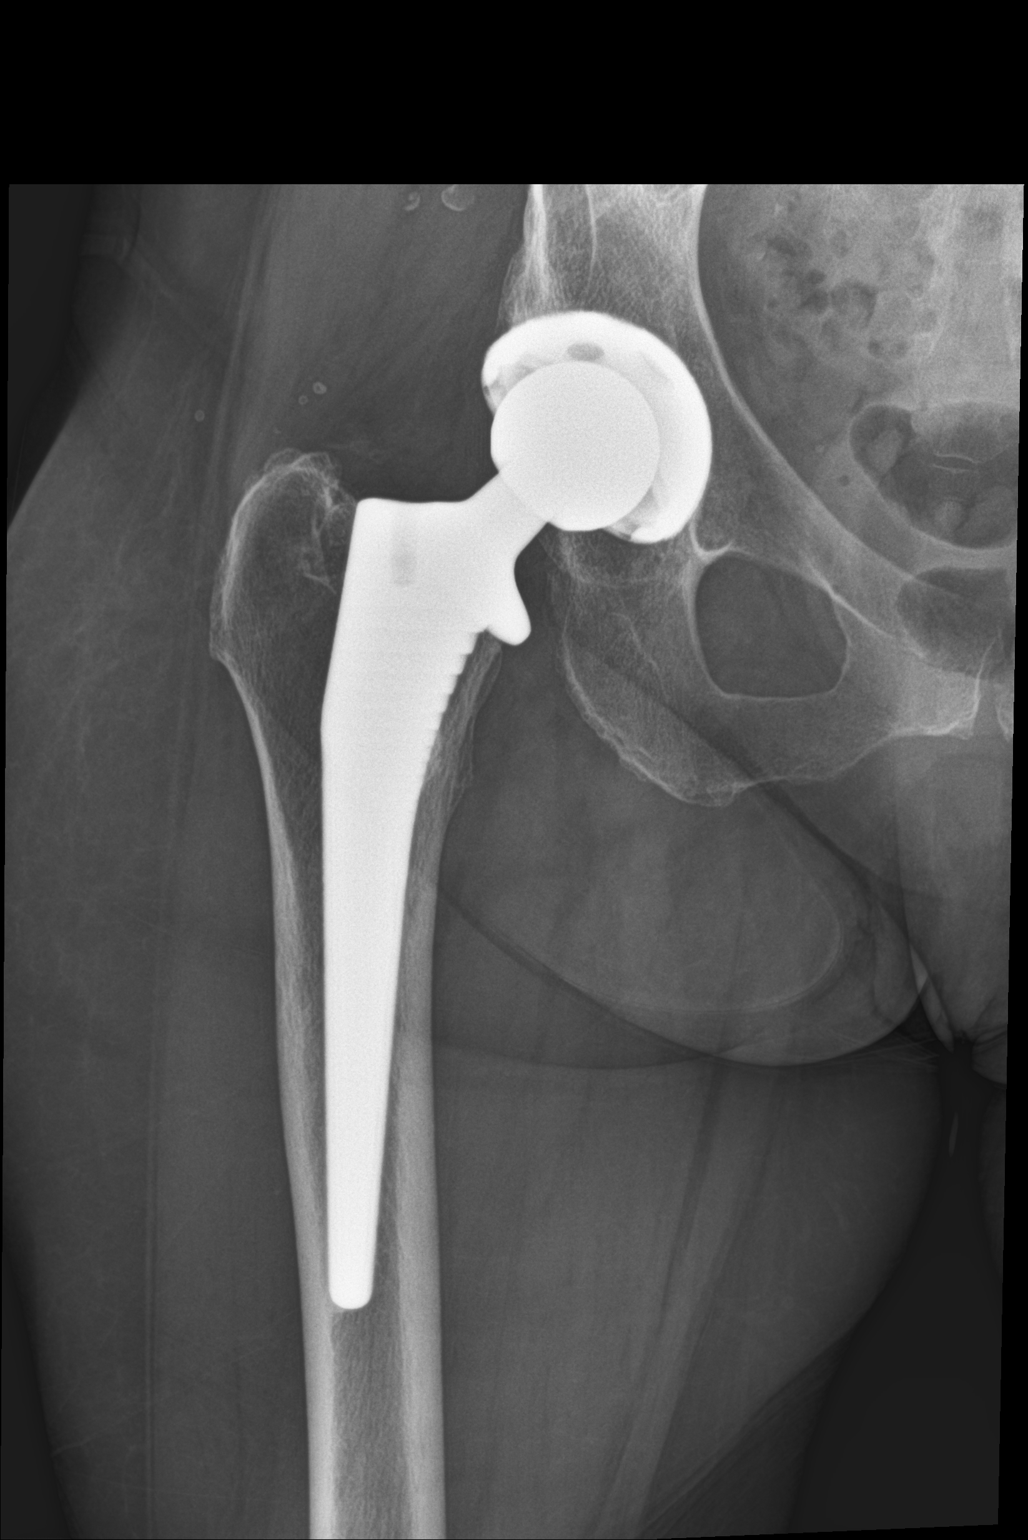

[hip lat]
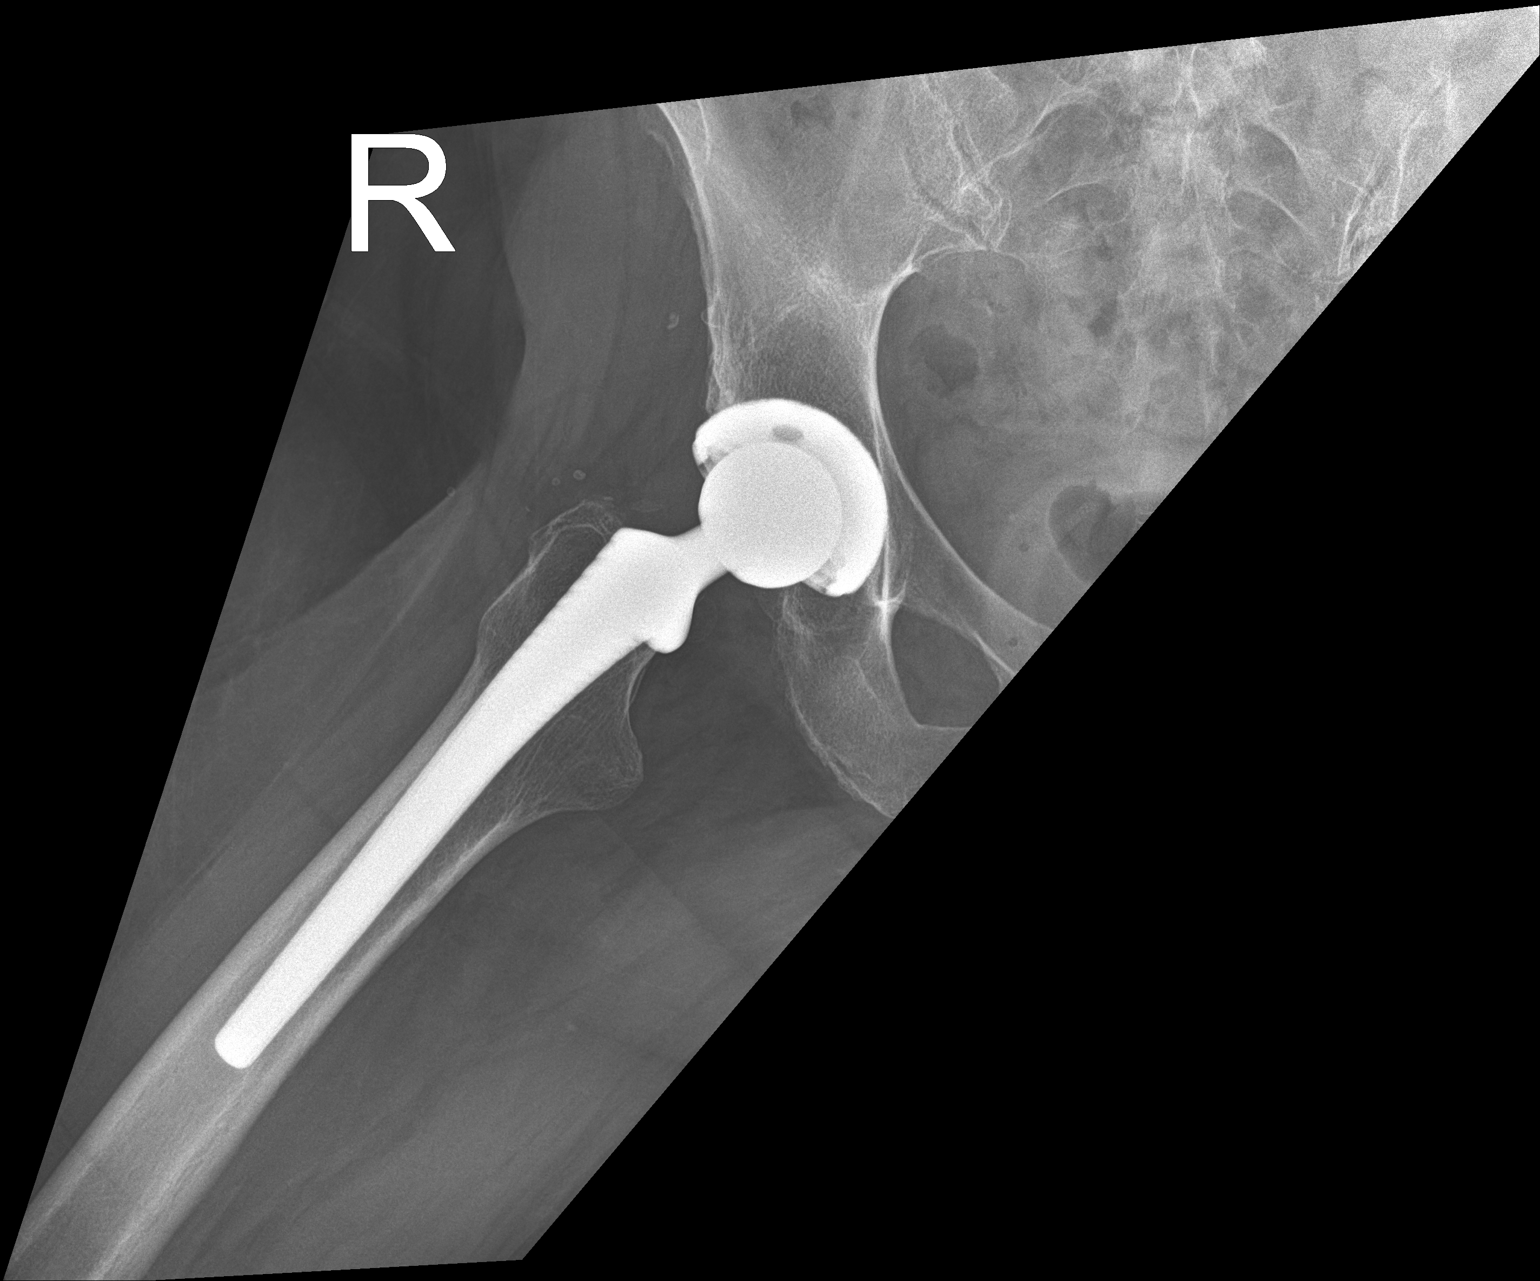

[3 of 3 positions shown; findings below may reference images not displayed]

FINDINGS: There is no evidence of hip fracture or dislocation. The components
of the total hip prosthesis are intact and properly located with no
evidence of loosening. No significant abnormality of the visualized
pelvic bones.
IMPRESSION: No acute abnormality.  No change.

## 2018-12-02 ENCOUNTER — Ambulatory Visit: Payer: Self-pay

## 2018-12-02 ENCOUNTER — Encounter: Payer: Medicare Other | Admitting: Family

## 2018-12-03 ENCOUNTER — Other Ambulatory Visit: Payer: Self-pay

## 2018-12-03 ENCOUNTER — Encounter: Payer: Self-pay | Admitting: Nurse Practitioner

## 2018-12-03 ENCOUNTER — Encounter: Payer: Medicare Other | Admitting: Family

## 2018-12-03 ENCOUNTER — Ambulatory Visit (INDEPENDENT_AMBULATORY_CARE_PROVIDER_SITE_OTHER): Payer: Medicare Other | Admitting: Nurse Practitioner

## 2018-12-03 DIAGNOSIS — Z0000 Encounter for general adult medical examination without abnormal findings: Secondary | ICD-10-CM | POA: Diagnosis not present

## 2018-12-03 DIAGNOSIS — E2839 Other primary ovarian failure: Secondary | ICD-10-CM | POA: Diagnosis not present
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
## 2018-12-03 NOTE — Progress Notes (Signed)
    This service is provided via telemedicine  No vital signs collected/recorded due to the encounter was a telemedicine visit.   Location of patient (ex: home, work):  Home  Patient consents to a telephone visit:  Yes  Location of the provider (ex: office, home):  [facility], Office   Name of any referring provider:  Veleta Miners  Names of all persons participating in the telemedicine service and their role in the encounter:  S.Chrae B/CMA, Sherrie Mustache, NP, and Patient   Time spent on call:  8 min with medical assistant

## 2018-12-03 NOTE — Patient Instructions (Addendum)
Ms. Rachel Morris , Thank you for taking time to come for your Medicare Wellness Visit. I appreciate your ongoing commitment to your health goals. Please review the following plan we discussed and let me know if I can assist you in the future.   Screening recommendations/referrals: Colonoscopy aged out Mammogram aged out  Bone Density need to update- order has been placed to schedule appt if you would like.  Recommended yearly ophthalmology/optometry visit for glaucoma screening and checkup Recommended yearly dental visit for hygiene and checkup  Vaccinations: Influenza vaccine due at this time Pneumococcal vaccine up to date Tdap vaccine up to date Shingles vaccine up to date    Advanced directives: on file   Conditions/risks identified: none.  Next appointment: 1 year   Preventive Care 56 Years and Older, Female Preventive care refers to lifestyle choices and visits with your health care provider that can promote health and wellness. What does preventive care include?  A yearly physical exam. This is also called an annual well check.  Dental exams once or twice a year.  Routine eye exams. Ask your health care provider how often you should have your eyes checked.  Personal lifestyle choices, including:  Daily care of your teeth and gums.  Regular physical activity.  Eating a healthy diet.  Avoiding tobacco and drug use.  Limiting alcohol use.  Practicing safe sex.  Taking low-dose aspirin every day.  Taking vitamin and mineral supplements as recommended by your health care provider. What happens during an annual well check? The services and screenings done by your health care provider during your annual well check will depend on your age, overall health, lifestyle risk factors, and family history of disease. Counseling  Your health care provider may ask you questions about your:  Alcohol use.  Tobacco use.  Drug use.  Emotional well-being.  Home and  relationship well-being.  Sexual activity.  Eating habits.  History of falls.  Memory and ability to understand (cognition).  Work and work Statistician.  Reproductive health. Screening  You may have the following tests or measurements:  Height, weight, and BMI.  Blood pressure.  Lipid and cholesterol levels. These may be checked every 5 years, or more frequently if you are over 69 years old.  Skin check.  Lung cancer screening. You may have this screening every year starting at age 32 if you have a 30-pack-year history of smoking and currently smoke or have quit within the past 15 years.  Fecal occult blood test (FOBT) of the stool. You may have this test every year starting at age 78.  Flexible sigmoidoscopy or colonoscopy. You may have a sigmoidoscopy every 5 years or a colonoscopy every 10 years starting at age 75.  Hepatitis C blood test.  Hepatitis B blood test.  Sexually transmitted disease (STD) testing.  Diabetes screening. This is done by checking your blood sugar (glucose) after you have not eaten for a while (fasting). You may have this done every 1-3 years.  Bone density scan. This is done to screen for osteoporosis. You may have this done starting at age 31.  Mammogram. This may be done every 1-2 years. Talk to your health care provider about how often you should have regular mammograms. Talk with your health care provider about your test results, treatment options, and if necessary, the need for more tests. Vaccines  Your health care provider may recommend certain vaccines, such as:  Influenza vaccine. This is recommended every year.  Tetanus, diphtheria, and acellular pertussis (  Tdap, Td) vaccine. You may need a Td booster every 10 years.  Zoster vaccine. You may need this after age 61.  Pneumococcal 13-valent conjugate (PCV13) vaccine. One dose is recommended after age 64.  Pneumococcal polysaccharide (PPSV23) vaccine. One dose is recommended after  age 80. Talk to your health care provider about which screenings and vaccines you need and how often you need them. This information is not intended to replace advice given to you by your health care provider. Make sure you discuss any questions you have with your health care provider. Document Released: 05/14/2015 Document Revised: 01/05/2016 Document Reviewed: 02/16/2015 Elsevier Interactive Patient Education  2017 [place] Prevention in the Home Falls can cause injuries. They can happen to people of all ages. There are many things you can do to make your home safe and to help prevent falls. What can I do on the outside of my home?  Regularly fix the edges of walkways and driveways and fix any cracks.  Remove anything that might make you trip as you walk through a door, such as a raised step or threshold.  Trim any bushes or trees on the path to your home.  Use bright outdoor lighting.  Clear any walking paths of anything that might make someone trip, such as rocks or tools.  Regularly check to see if handrails are loose or broken. Make sure that both sides of any steps have handrails.  Any raised decks and porches should have guardrails on the edges.  Have any leaves, snow, or ice cleared regularly.  Use sand or salt on walking paths during winter.  Clean up any spills in your garage right away. This includes oil or grease spills. What can I do in the bathroom?  Use night lights.  Install grab bars by the toilet and in the tub and shower. Do not use towel bars as grab bars.  Use non-skid mats or decals in the tub or shower.  If you need to sit down in the shower, use a plastic, non-slip stool.  Keep the floor dry. Clean up any water that spills on the floor as soon as it happens.  Remove soap buildup in the tub or shower regularly.  Attach bath mats securely with double-sided non-slip rug tape.  Do not have throw rugs and other things on the floor that can  make you trip. What can I do in the bedroom?  Use night lights.  Make sure that you have a light by your bed that is easy to reach.  Do not use any sheets or blankets that are too big for your bed. They should not hang down onto the floor.  Have a firm chair that has side arms. You can use this for support while you get dressed.  Do not have throw rugs and other things on the floor that can make you trip. What can I do in the kitchen?  Clean up any spills right away.  Avoid walking on wet floors.  Keep items that you use a lot in easy-to-reach places.  If you need to reach something above you, use a strong step stool that has a grab bar.  Keep electrical cords out of the way.  Do not use floor polish or wax that makes floors slippery. If you must use wax, use non-skid floor wax.  Do not have throw rugs and other things on the floor that can make you trip. What can I do with my stairs?  Do  not leave any items on the stairs.  Make sure that there are handrails on both sides of the stairs and use them. Fix handrails that are broken or loose. Make sure that handrails are as long as the stairways.  Check any carpeting to make sure that it is firmly attached to the stairs. Fix any carpet that is loose or worn.  Avoid having throw rugs at the top or bottom of the stairs. If you do have throw rugs, attach them to the floor with carpet tape.  Make sure that you have a light switch at the top of the stairs and the bottom of the stairs. If you do not have them, ask someone to add them for you. What else can I do to help prevent falls?  Wear shoes that:  Do not have high heels.  Have rubber bottoms.  Are comfortable and fit you well.  Are closed at the toe. Do not wear sandals.  If you use a stepladder:  Make sure that it is fully opened. Do not climb a closed stepladder.  Make sure that both sides of the stepladder are locked into place.  Ask someone to hold it for you,  if possible.  Clearly mark and make sure that you can see:  Any grab bars or handrails.  First and last steps.  Where the edge of each step is.  Use tools that help you move around (mobility aids) if they are needed. These include:  Canes.  Walkers.  Scooters.  Crutches.  Turn on the lights when you go into a dark area. Replace any light bulbs as soon as they burn out.  Set up your furniture so you have a clear path. Avoid moving your furniture around.  If any of your floors are uneven, fix them.  If there are any pets around you, be aware of where they are.  Review your medicines with your doctor. Some medicines can make you feel dizzy. This can increase your chance of falling. Ask your doctor what other things that you can do to help prevent falls. This information is not intended to replace advice given to you by your health care provider. Make sure you discuss any questions you have with your health care provider. Document Released: 02/11/2009 Document Revised: 09/23/2015 Document Reviewed: 05/22/2014 Elsevier Interactive Patient Education  2017 [employer].

## 2019-02-10 DIAGNOSIS — R41841 Cognitive communication deficit: Secondary | ICD-10-CM | POA: Diagnosis not present

## 2019-02-10 DIAGNOSIS — M545 Low back pain: Secondary | ICD-10-CM | POA: Diagnosis not present

## 2019-02-10 DIAGNOSIS — M25511 Pain in right shoulder: Secondary | ICD-10-CM | POA: Diagnosis not present

## 2019-02-12 DIAGNOSIS — R41841 Cognitive communication deficit: Secondary | ICD-10-CM | POA: Diagnosis not present

## 2019-02-12 DIAGNOSIS — M25511 Pain in right shoulder: Secondary | ICD-10-CM | POA: Diagnosis not present

## 2019-02-12 DIAGNOSIS — M545 Low back pain: Secondary | ICD-10-CM | POA: Diagnosis not present

## 2019-02-17 DIAGNOSIS — R41841 Cognitive communication deficit: Secondary | ICD-10-CM | POA: Diagnosis not present

## 2019-02-17 DIAGNOSIS — M25511 Pain in right shoulder: Secondary | ICD-10-CM | POA: Diagnosis not present

## 2019-02-17 DIAGNOSIS — M545 Low back pain: Secondary | ICD-10-CM | POA: Diagnosis not present

## 2019-02-19 DIAGNOSIS — M25511 Pain in right shoulder: Secondary | ICD-10-CM | POA: Diagnosis not present

## 2019-02-19 DIAGNOSIS — M545 Low back pain: Secondary | ICD-10-CM | POA: Diagnosis not present

## 2019-02-19 DIAGNOSIS — R41841 Cognitive communication deficit: Secondary | ICD-10-CM | POA: Diagnosis not present

## 2019-02-24 DIAGNOSIS — M545 Low back pain: Secondary | ICD-10-CM | POA: Diagnosis not present

## 2019-02-24 DIAGNOSIS — R41841 Cognitive communication deficit: Secondary | ICD-10-CM | POA: Diagnosis not present

## 2019-02-24 DIAGNOSIS — M25511 Pain in right shoulder: Secondary | ICD-10-CM | POA: Diagnosis not present

## 2019-02-26 DIAGNOSIS — M545 Low back pain: Secondary | ICD-10-CM | POA: Diagnosis not present

## 2019-02-26 DIAGNOSIS — M25511 Pain in right shoulder: Secondary | ICD-10-CM | POA: Diagnosis not present

## 2019-02-26 DIAGNOSIS — R41841 Cognitive communication deficit: Secondary | ICD-10-CM | POA: Diagnosis not present

## 2019-03-03 DIAGNOSIS — M25511 Pain in right shoulder: Secondary | ICD-10-CM | POA: Diagnosis not present

## 2019-03-03 DIAGNOSIS — M545 Low back pain: Secondary | ICD-10-CM | POA: Diagnosis not present

## 2019-03-03 DIAGNOSIS — R41841 Cognitive communication deficit: Secondary | ICD-10-CM | POA: Diagnosis not present

## 2019-03-04 DIAGNOSIS — H40053 Ocular hypertension, bilateral: Secondary | ICD-10-CM | POA: Diagnosis not present

## 2019-03-04 DIAGNOSIS — Z961 Presence of intraocular lens: Secondary | ICD-10-CM | POA: Diagnosis not present

## 2019-03-04 DIAGNOSIS — H04123 Dry eye syndrome of bilateral lacrimal glands: Secondary | ICD-10-CM | POA: Diagnosis not present

## 2019-03-05 DIAGNOSIS — M25511 Pain in right shoulder: Secondary | ICD-10-CM | POA: Diagnosis not present

## 2019-03-05 DIAGNOSIS — M545 Low back pain: Secondary | ICD-10-CM | POA: Diagnosis not present

## 2019-03-05 DIAGNOSIS — R41841 Cognitive communication deficit: Secondary | ICD-10-CM | POA: Diagnosis not present

## 2019-03-10 DIAGNOSIS — M25511 Pain in right shoulder: Secondary | ICD-10-CM | POA: Diagnosis not present

## 2019-03-10 DIAGNOSIS — M545 Low back pain: Secondary | ICD-10-CM | POA: Diagnosis not present

## 2019-03-10 DIAGNOSIS — R41841 Cognitive communication deficit: Secondary | ICD-10-CM | POA: Diagnosis not present

## 2019-03-12 DIAGNOSIS — M25511 Pain in right shoulder: Secondary | ICD-10-CM | POA: Diagnosis not present

## 2019-03-12 DIAGNOSIS — R41841 Cognitive communication deficit: Secondary | ICD-10-CM | POA: Diagnosis not present

## 2019-03-12 DIAGNOSIS — M545 Low back pain: Secondary | ICD-10-CM | POA: Diagnosis not present

## 2019-03-17 DIAGNOSIS — M25511 Pain in right shoulder: Secondary | ICD-10-CM | POA: Diagnosis not present

## 2019-03-17 DIAGNOSIS — M545 Low back pain: Secondary | ICD-10-CM | POA: Diagnosis not present

## 2019-03-17 DIAGNOSIS — R41841 Cognitive communication deficit: Secondary | ICD-10-CM | POA: Diagnosis not present

## 2019-03-19 DIAGNOSIS — M25511 Pain in right shoulder: Secondary | ICD-10-CM | POA: Diagnosis not present

## 2019-03-19 DIAGNOSIS — M545 Low back pain: Secondary | ICD-10-CM | POA: Diagnosis not present

## 2019-03-19 DIAGNOSIS — R41841 Cognitive communication deficit: Secondary | ICD-10-CM | POA: Diagnosis not present

## 2019-04-03 DIAGNOSIS — R109 Unspecified abdominal pain: Secondary | ICD-10-CM | POA: Diagnosis not present

## 2019-04-03 DIAGNOSIS — R197 Diarrhea, unspecified: Secondary | ICD-10-CM | POA: Diagnosis not present

## 2019-04-03 DIAGNOSIS — R1110 Vomiting, unspecified: Secondary | ICD-10-CM | POA: Diagnosis not present

## 2019-04-03 DIAGNOSIS — Z03818 Encounter for observation for suspected exposure to other biological agents ruled out: Secondary | ICD-10-CM | POA: Diagnosis not present

## 2019-04-05 ENCOUNTER — Emergency Department (HOSPITAL_COMMUNITY): Payer: Medicare Other

## 2019-04-05 ENCOUNTER — Inpatient Hospital Stay (HOSPITAL_COMMUNITY)
Admission: EM | Admit: 2019-04-05 | Discharge: 2019-04-09 | DRG: 897 | Disposition: A | Discharge status: Home Health | Payer: Medicare Other | Attending: Family Medicine | Admitting: Family Medicine

## 2019-04-05 ENCOUNTER — Encounter (HOSPITAL_COMMUNITY): Payer: Self-pay | Admitting: Emergency Medicine

## 2019-04-05 ENCOUNTER — Other Ambulatory Visit: Payer: Self-pay

## 2019-04-05 DIAGNOSIS — Z7289 Other problems related to lifestyle: Secondary | ICD-10-CM

## 2019-04-05 DIAGNOSIS — K2970 Gastritis, unspecified, without bleeding: Secondary | ICD-10-CM

## 2019-04-05 DIAGNOSIS — R0789 Other chest pain: Secondary | ICD-10-CM | POA: Diagnosis not present

## 2019-04-05 DIAGNOSIS — Z789 Other specified health status: Secondary | ICD-10-CM

## 2019-04-05 DIAGNOSIS — K5900 Constipation, unspecified: Secondary | ICD-10-CM | POA: Diagnosis present

## 2019-04-05 DIAGNOSIS — R778 Other specified abnormalities of plasma proteins: Secondary | ICD-10-CM | POA: Diagnosis not present

## 2019-04-05 DIAGNOSIS — Z96641 Presence of right artificial hip joint: Secondary | ICD-10-CM | POA: Diagnosis present

## 2019-04-05 DIAGNOSIS — Z79899 Other long term (current) drug therapy: Secondary | ICD-10-CM

## 2019-04-05 DIAGNOSIS — R7989 Other specified abnormal findings of blood chemistry: Secondary | ICD-10-CM

## 2019-04-05 DIAGNOSIS — F419 Anxiety disorder, unspecified: Secondary | ICD-10-CM | POA: Diagnosis not present

## 2019-04-05 DIAGNOSIS — F10230 Alcohol dependence with withdrawal, uncomplicated: Secondary | ICD-10-CM

## 2019-04-05 DIAGNOSIS — F10239 Alcohol dependence with withdrawal, unspecified: Principal | ICD-10-CM | POA: Diagnosis present

## 2019-04-05 DIAGNOSIS — E785 Hyperlipidemia, unspecified: Secondary | ICD-10-CM | POA: Diagnosis present

## 2019-04-05 DIAGNOSIS — R079 Chest pain, unspecified: Secondary | ICD-10-CM | POA: Diagnosis present

## 2019-04-05 DIAGNOSIS — K2920 Alcoholic gastritis without bleeding: Secondary | ICD-10-CM

## 2019-04-05 DIAGNOSIS — M810 Age-related osteoporosis without current pathological fracture: Secondary | ICD-10-CM | POA: Diagnosis present

## 2019-04-05 DIAGNOSIS — Z20828 Contact with and (suspected) exposure to other viral communicable diseases: Secondary | ICD-10-CM | POA: Diagnosis present

## 2019-04-05 DIAGNOSIS — G250 Essential tremor: Secondary | ICD-10-CM | POA: Diagnosis present

## 2019-04-05 DIAGNOSIS — K2100 Gastro-esophageal reflux disease with esophagitis, without bleeding: Secondary | ICD-10-CM | POA: Diagnosis present

## 2019-04-05 LAB — CBC WITH DIFFERENTIAL/PLATELET
Abs Immature Granulocytes: 0.07 10*3/uL (ref 0.00–0.07)
Basophils Absolute: 0.1 10*3/uL (ref 0.0–0.1)
Basophils Relative: 1 %
Eosinophils Absolute: 0.2 10*3/uL (ref 0.0–0.5)
Eosinophils Relative: 2 %
HCT: 41.9 % (ref 36.0–46.0)
Hemoglobin: 13.9 g/dL (ref 12.0–15.0)
Immature Granulocytes: 1 %
Lymphocytes Relative: 31 %
Lymphs Abs: 2.9 10*3/uL (ref 0.7–4.0)
MCH: 34.3 pg — ABNORMAL HIGH (ref 26.0–34.0)
MCHC: 33.2 g/dL (ref 30.0–36.0)
MCV: 103.5 fL — ABNORMAL HIGH (ref 80.0–100.0)
Monocytes Absolute: 0.9 10*3/uL (ref 0.1–1.0)
Monocytes Relative: 10 %
Neutro Abs: 5.2 10*3/uL (ref 1.7–7.7)
Neutrophils Relative %: 55 %
Platelets: 217 10*3/uL (ref 150–400)
RBC: 4.05 MIL/uL (ref 3.87–5.11)
RDW: 13.8 % (ref 11.5–15.5)
WBC: 9.3 10*3/uL (ref 4.0–10.5)
nRBC: 0 % (ref 0.0–0.2)

## 2019-04-05 LAB — COMPREHENSIVE METABOLIC PANEL
ALT: 74 U/L — ABNORMAL HIGH (ref 0–44)
AST: 82 U/L — ABNORMAL HIGH (ref 15–41)
Albumin: 4 g/dL (ref 3.5–5.0)
Alkaline Phosphatase: 83 U/L (ref 38–126)
Anion gap: 11 (ref 5–15)
BUN: 13 mg/dL (ref 8–23)
CO2: 21 mmol/L — ABNORMAL LOW (ref 22–32)
Calcium: 10 mg/dL (ref 8.9–10.3)
Chloride: 101 mmol/L (ref 98–111)
Creatinine, Ser: 0.68 mg/dL (ref 0.44–1.00)
GFR calc Af Amer: >60 mL/min (ref >60)
GFR calc non Af Amer: >60 mL/min (ref >60)
Glucose, Bld: 85 mg/dL (ref 70–99)
Potassium: 4.9 mmol/L (ref 3.5–5.1)
Sodium: 133 mmol/L — ABNORMAL LOW (ref 135–145)
Total Bilirubin: 0.8 mg/dL (ref 0.3–1.2)
Total Protein: 6.8 g/dL (ref 6.5–8.1)

## 2019-04-05 LAB — CBG MONITORING, ED: Glucose-Capillary: 86 mg/dL (ref 70–99)

## 2019-04-05 LAB — TROPONIN I (HIGH SENSITIVITY)
Troponin I (High Sensitivity): 18 ng/L — ABNORMAL HIGH (ref <18)
Troponin I (High Sensitivity): 23 ng/L — ABNORMAL HIGH (ref <18)

## 2019-04-05 LAB — POC SARS CORONAVIRUS 2 AG -  ED: SARS Coronavirus 2 Ag: NEGATIVE

## 2019-04-05 MED ORDER — SODIUM CHLORIDE 0.9% FLUSH: 3.0000 mL | Freq: Once | INTRAVENOUS | Status: DC

## 2019-04-05 MED ORDER — LACTATED RINGERS IV BOLUS
1000.0000 mL | Freq: Once | INTRAVENOUS | Status: AC
  Administered 2019-04-05: 21:00:00 1000 mL via INTRAVENOUS

## 2019-04-05 NOTE — ED Provider Notes (Signed)
[facility] EMER[facility]ENCY DEPARTMENT Provider Note   CSN: [id number] Arrival date & time: 04/05/19  1707     History   Chief Complaint Chief Complaint  Patient presents with  . Shortness of Breath  . Chest Pain  . Tremors    HPI Rachel Morris is a 85 y.o. female.     85 year old female with past medical history below who presents with shakiness and malaise.  Approximately 3 days ago, she began having nausea, vomiting, and diarrhea that persisted throughout the night.  The following day she was seen at an urgent care and was tested for COVID-19 but has not received results yet.  The vomiting and diarrhea have resolved but since then she has had shakiness/tremulousness.  Today she sat around most of the day watching TV.  She has had decreased appetite but states that she has been drinking fluids.  She had some very light chest pressure centrally this afternoon, is not sure whether it was related to feeling anxious about the possibility of having COVID-19.  She denies any associated shortness of breath.  She started having a very mild cough yesterday.  No fevers.  She notes that 2 people living in her assisted living facility tested positive for COVID-19 recently.  Daughter brought her in for evaluation.  Although triage note states shortness of breath and abdominal pain, patient denies any pain at all for me.  She states she has had some abdominal bloating but no abdominal pain.  She also denies shortness of breath.  The history is provided by the patient.  Shortness of Breath Associated symptoms: chest pain   Chest Pain Associated symptoms: shortness of breath     Past Medical History:  Diagnosis Date  . Allergic rhinitis due to pollen   . Anxiety state, unspecified   . Arthritis    RIGHT LEG, HANDS.  [name] Cancer (HCC)    FACE/NOSE -SKIN-squamous cell and basal cell.  . Disorder of bone and cartilage, unspecified   . Lumbago   . Lyme borreliosis   . Other  atopic dermatitis and related conditions   . Other dysphagia   . Reflux esophagitis   . Senile osteoporosis   . TMJ dysfunction    MORE ON LEFT "WEARS BITE PROTECTION AT NIGHT"    Patient Active Problem List   Diagnosis Date Noted  . Tremor, essential 11/06/2018  . Pure hypercholesterolemia 04/22/2018  . Slow transit constipation 04/22/2018  . History of right hip replacement 02/25/2018  . Trochanteric bursitis, right hip 05/29/2017  . Anxiety 04/16/2017  . Alcohol use 04/16/2017  . Primary insomnia 04/16/2017  . Presence of pessary 10/16/2016  . Status post total replacement of right hip 12/10/2015  . Primary osteoarthritis of right hip 10/15/2015  . Senile osteoporosis 09/30/2012  . Reflux esophagitis 09/30/2012  . Hyperlipidemia with target LDL less than 100   . Allergic rhinitis due to pollen   . Cystocele with small rectocele and uterine descent 09/09/2012  . Vaginal atrophy 09/09/2012    Past Surgical History:  Procedure Laterality Date  . APPENDECTOMY  1950  . CATARACT EXTRACTION, BILATERAL Bilateral   . EYE SURGERY Right    right eye ptrigium excision x2  . HERNIA REPAIR Bilateral   . TOTAL HIP ARTHROPLASTY Right 12/10/2015   Procedure: RIGHT TOTAL HIP ARTHROPLASTY ANTERIOR APPROACH;  Surgeon: [name], MD;  Location: [facility];  Service: Orthopedics;  Laterality: Right;  Spinal to General     OB History  Gravida  2   Para  2   Term      Preterm      AB      Living  2     SAB      TAB      Ectopic      Multiple      Live Births               Home Medications    Prior to Admission medications   Medication Sig Start Date End Date Taking? Authorizing Provider  acetaminophen (TYLENOL) 325 MG tablet Take 650 mg by mouth every 6 (six) hours as needed for mild pain.   Yes [provider]  cholecalciferol (VITAMIN D) 1000 UNITS tablet Take 1,000 Units by mouth daily.   Yes [provider]  GARLIC PO Take 1  tablet by mouth daily.   Yes [provider]  latanoprost (XALATAN) 0.005 % ophthalmic solution Place 1 drop into both eyes at bedtime. 03/14/19  Yes [provider]    Family History No family history on file.  Social History Social History   Tobacco Use  . Smoking status: Never Smoker  . Smokeless tobacco: Never Used  . Tobacco comment: past seconday smoke from husband  Substance Use Topics  . Alcohol use: Yes    Alcohol/week: 0.0 standard drinks    Comment:  2 shots at bedtime  . Drug use: No     Allergies   Brandy [alcohol], Prune, Aspirin, Augmentin [amoxicillin-pot clavulanate], Clindamycin, Omnicef [cefdinir], Vinegar [acetic acid], Alprazolam, Ceftin [cefuroxime axetil], Doxycycline, Eryc [erythromycin], and Penicillins   Review of Systems Review of Systems  Respiratory: Positive for shortness of breath.   Cardiovascular: Positive for chest pain.   All other systems reviewed and are negative except that which was mentioned in HPI   Physical Exam Updated Vital Signs BP (!) 152/78   Pulse (!) 101   Temp 98.8 F (37.1 C) (Oral)   Resp 17   Ht 5\' 2"  (1.575 m)   Wt 53.5 kg   SpO2 97%   BMI 21.58 kg/m   Physical Exam Vitals signs and nursing note reviewed.  Constitutional:      General: She is not in acute distress.    Appearance: She is well-developed.  HENT:     Head: Normocephalic and atraumatic.  Eyes:     Conjunctiva/sclera: Conjunctivae normal.  Neck:     Musculoskeletal: Neck supple.  Cardiovascular:     Rate and Rhythm: Regular rhythm. Tachycardia present.     Heart sounds: Normal heart sounds. No murmur.  Pulmonary:     Effort: Pulmonary effort is normal.     Breath sounds: Normal breath sounds.  Abdominal:     General: Bowel sounds are normal. There is no distension.     Palpations: Abdomen is soft.     Tenderness: There is no abdominal tenderness.  Musculoskeletal:     Right lower leg: No edema.     Left lower leg:  No edema.  Skin:    General: Skin is warm and dry.  Neurological:     Mental Status: She is alert and oriented to person, place, and time.     Comments: Fluent speech, mild tremor b/l hands  Psychiatric:        Judgment: Judgment normal.     Comments: Mildly anxious but pleasant      ED Treatments / Results  Labs (all labs ordered are listed, but only abnormal results  are displayed) Labs Reviewed  CBC WITH DIFFERENTIAL/PLATELET - Abnormal; Notable for the following components:      Result Value   MCV 103.5 (*)    MCH 34.3 (*)    All other components within normal limits  COMPREHENSIVE METABOLIC PANEL - Abnormal; Notable for the following components:   Sodium 133 (*)    CO2 21 (*)    AST 82 (*)    ALT 74 (*)    All other components within normal limits  TROPONIN I (HIGH SENSITIVITY) - Abnormal; Notable for the following components:   Troponin I (High Sensitivity) 18 (*)    All other components within normal limits  TROPONIN I (HIGH SENSITIVITY) - Abnormal; Notable for the following components:   Troponin I (High Sensitivity) 23 (*)    All other components within normal limits  SARS CORONAVIRUS 2 (TAT 6-24 HRS)  URINALYSIS, ROUTINE W REFLEX MICROSCOPIC  CBG MONITORING, ED  POC SARS CORONAVIRUS 2 AG -  ED    EKG EKG Interpretation  Date/Time:  Saturday April 05 2019 17:23:24 EST Ventricular Rate:  101 PR Interval:  134 QRS Duration: 78 QT Interval:  340 QTC Calculation: 440 R Axis:   39 Text Interpretation: Sinus tachycardia Otherwise normal ECG No significant change since last tracing Confirmed by Theotis Burrow [phone]) on 04/05/2019 7:19:17 PM   Radiology Dg Chest Portable 1 View  Result Date: 04/05/2019 CLINICAL DATA:  Shortness of breath. EXAM: PORTABLE CHEST 1 VIEW COMPARISON:  June 15, 2017 FINDINGS: The heart size and mediastinal contours are within normal limits. Both lungs are clear. The visualized skeletal structures are unremarkable. IMPRESSION:  No active disease. Electronically Signed   By: Dorise Bullion III M.D   On: 04/05/2019 20:00    Procedures Procedures (including critical care time)  Medications Ordered in ED Medications  sodium chloride flush (NS) 0.9 % injection 3 mL (3 mLs Intravenous Not Given 04/05/19 2100)  lactated ringers bolus 1,000 mL (0 mLs Intravenous Stopped 04/05/19 2200)     Initial Impression / Assessment and Plan / ED Course  I have reviewed the triage vital signs and the nursing notes.  Pertinent labs & imaging results that were available during my care of the patient were reviewed by me and considered in my medical decision making (see chart for details).       Patient was well-appearing, mildly anxious with mild tachycardia but otherwise well-appearing and pleasant on exam.  Normal O2 saturation and afebrile. EKG without ischemic changes. CXR clear. Rapid COVID negative, PCR test sent.  Lab work notable for mild elevations of AST at 82, ALT 74 which may be related to viral infection, reassuring CBC.  Initial troponin 18, repeat 23.  Given the patient's report of chest pain and elevated troponin of unclear etiology, recommended admission for trending.  Discussed with [facility], Dr.Garba.   Rachel Morris was evaluated in Emergency Department on 04/05/2019 for the symptoms described in the history of present illness. She was evaluated in the context of the global COVID-19 pandemic, which necessitated consideration that the patient might be at risk for infection with the SARS-CoV-2 virus that causes COVID-19. Institutional protocols and algorithms that pertain to the evaluation of patients at risk for COVID-19 are in a state of rapid change based on information released by regulatory bodies including the CDC and federal and state organizations. These policies and algorithms were followed during the patient's care in the ED.  Final Clinical Impressions(s) / ED Diagnoses   Final diagnoses:  Elevated troponin     ED Discharge Orders    None       Ellison Rieth, Wenda Overland, MD 04/05/19 2333

## 2019-04-05 NOTE — ED Triage Notes (Addendum)
Per pt and daughter- [facility] lives at [facility] where there are some residents that are positive for covid. The patient has had nausea vomiting and diarrhea for a week that resolved Thursday. Pt has had abdominal pain since then. States today she has had centralized non radiating chest pressure with shortness of breath. Her covid test is pending. Pt also has "the shakes" and has tremors to her arms.   * Pt daughters number is [phone], [name], she is outside waiting in the car and wants to be called when the patient is in a room.

## 2019-04-05 NOTE — ED Notes (Signed)
Please call daughter Charlynn Court  [phone] a status update--Maxximus Gotay

## 2019-04-05 NOTE — H&P (Signed)
History and Physical   Rachel Morris [id number] DOB: 07-10-1930 DOA: 04/05/2019  Referring MD/NP/PA: Dr. Rex Kras  PCP: Virgie Dad, MD   Outpatient Specialists: None  Patient coming from: Home  Chief Complaint: Chest pain  HPI: Rachel Morris is a 85 y.o. female with medical history significant of total hip replacement, hyperlipidemia, insomnia, essential tremor, elevated blood pressure but no diagnosis of hypertension who presents to the ER with chest pressure.  Patient reported GI symptoms with nausea vomiting and abdominal pain with some diarrhea about 3 days ago.  Also viral-like illness at the time.  Was seen in the outpatient setting and COVID-19 testing performed results still pending.  Symptoms have largely resolved but today has some chest pressure.  Associated with some nausea but mild diaphoresis.  Patient came to the ER where he was is being evaluated.  Repeat COVID-19 testing has been done in the ER with PCR.  Currently pending.  Patient noted to have elevated troponin at 0.18.  He is being admitted to the hospital for rule out MI but also as a PUI.[name]  ED Course: Temperature is 98.8 blood pressure 162/74 pulse 107.  Respirate of 17 oxygen sat 97% on room air.  Sodium is 133, CO2 21.  Creatinine 0.68.  The rest of the chemistry appeared to be within normal troponin initially 18 then 23.  CBC within normal.  Chest x-ray showed no acute findings.  EKG also showed no significant finding.  Patient is being admitted as a PUI and for chest pain rule out.  Chest x-ray showed no acute findings.  Review of Systems: As per HPI otherwise 10 point review of systems negative.    Past Medical History:  Diagnosis Date  . Allergic rhinitis due to pollen   . Anxiety state, unspecified   . Arthritis    RIGHT LEG, HANDS.  [name] Cancer (HCC)    FACE/NOSE -SKIN-squamous cell and basal cell.  . Disorder of bone and cartilage, unspecified   . Lumbago   . Lyme borreliosis   . Other atopic  dermatitis and related conditions   . Other dysphagia   . Reflux esophagitis   . Senile osteoporosis   . TMJ dysfunction    MORE ON LEFT "WEARS BITE PROTECTION AT NIGHT"    Past Surgical History:  Procedure Laterality Date  . APPENDECTOMY  1950  . CATARACT EXTRACTION, BILATERAL Bilateral   . EYE SURGERY Right    right eye ptrigium excision x2  . HERNIA REPAIR Bilateral   . TOTAL HIP ARTHROPLASTY Right 12/10/2015   Procedure: RIGHT TOTAL HIP ARTHROPLASTY ANTERIOR APPROACH;  Surgeon: [name], MD;  Location: [facility];  Service: Orthopedics;  Laterality: Right;  Spinal to General     reports that she has never smoked. She has never used smokeless tobacco. She reports current alcohol use. She reports that she does not use drugs.  Allergies  Allergen Reactions  . Brandy [Alcohol] Anaphylaxis    Related to grapes  . Prune Anaphylaxis    PLUMS,GRAPES,RAISINS  . Aspirin Other (See Comments)    Stomach bleed  . Augmentin [Amoxicillin-Pot Clavulanate] Hives and Itching  . Clindamycin Other (See Comments)    unkown  . Omnicef [Cefdinir] Hives  . Vinegar [Acetic Acid]     basalmic  . Alprazolam Rash  . Ceftin [Cefuroxime Axetil] Rash  . Doxycycline Rash  . Eryc [Erythromycin] Rash  . Penicillins Rash    No family history on file.   Prior to Admission  medications   Medication Sig Start Date End Date Taking? Authorizing Provider  acetaminophen (TYLENOL) 325 MG tablet Take 650 mg by mouth every 6 (six) hours as needed for mild pain.   Yes [provider]  cholecalciferol (VITAMIN D) 1000 UNITS tablet Take 1,000 Units by mouth daily.   Yes [provider]  GARLIC PO Take 1 tablet by mouth daily.   Yes [provider]  latanoprost (XALATAN) 0.005 % ophthalmic solution Place 1 drop into both eyes at bedtime. 03/14/19  Yes [provider]    Physical Exam: Vitals:   04/05/19 2000 04/05/19 2015 04/05/19 2030 04/05/19 2300  BP: (!)  157/85 (!) 160/69 (!) 162/74 (!) 152/78  Pulse: 100 (!) 103 (!) 102 (!) 101  Resp: 14 14 15 17   Temp:      TempSrc:      SpO2: 97% 97% 98% 97%  Weight:      Height:          Constitutional: NAD, calm, comfortable Vitals:   04/05/19 2000 04/05/19 2015 04/05/19 2030 04/05/19 2300  BP: (!) 157/85 (!) 160/69 (!) 162/74 (!) 152/78  Pulse: 100 (!) 103 (!) 102 (!) 101  Resp: 14 14 15 17   Temp:      TempSrc:      SpO2: 97% 97% 98% 97%  Weight:      Height:       Eyes: PERRL, lids and conjunctivae normal ENMT: Mucous membranes are moist. Posterior pharynx clear of any exudate or lesions.Normal dentition.  Neck: normal, supple, no masses, no thyromegaly Respiratory: clear to auscultation bilaterally, no wheezing, no crackles. Normal respiratory effort. No accessory muscle use.  Cardiovascular: Sinus tachycardia, No murmurs / rubs / gallops. No extremity edema. 2+ pedal pulses. No carotid bruits.  Abdomen: no tenderness, no masses palpated. No hepatosplenomegaly. Bowel sounds positive.  Musculoskeletal: no clubbing / cyanosis. No joint deformity upper and lower extremities. Good ROM, no contractures. Normal muscle tone.  Skin: no rashes, lesions, ulcers. No induration Neurologic: CN 2-12 grossly intact. Sensation intact, DTR normal. Strength 5/5 in all 4.  Psychiatric: Normal judgment and insight. Alert and oriented x 3. Normal mood.     Labs on Admission: I have personally reviewed following labs and imaging studies  CBC: Recent Labs  Lab 04/05/19 1758  WBC 9.3  NEUTROABS 5.2  HGB 13.9  HCT 41.9  MCV 103.5*  PLT [id number]   Basic Metabolic Panel: Recent Labs  Lab 04/05/19 1758  NA 133*  K 4.9  CL 101  CO2 21*  GLUCOSE 85  BUN 13  CREATININE 0.68  CALCIUM 10.0   GFR: Estimated Creatinine Clearance: 39.9 mL/min (by C-G formula based on SCr of 0.68 mg/dL). Liver Function Tests: Recent Labs  Lab 04/05/19 1758  AST 82*  ALT 74*  ALKPHOS 83  BILITOT 0.8  PROT 6.8   ALBUMIN 4.0   No results for input(s): LIPASE, AMYLASE in the last 168 hours. No results for input(s): AMMONIA in the last 168 hours. Coagulation Profile: No results for input(s): INR, PROTIME in the last 168 hours. Cardiac Enzymes: No results for input(s): CKTOTAL, CKMB, CKMBINDEX, TROPONINI in the last 168 hours. BNP (last 3 results) No results for input(s): PROBNP in the last 8760 hours. HbA1C: No results for input(s): HGBA1C in the last 72 hours. CBG: Recent Labs  Lab 04/05/19 1729  GLUCAP 86   Lipid Profile: No results for input(s): CHOL, HDL, LDLCALC, TRIG, CHOLHDL, LDLDIRECT in the last 72 hours. Thyroid  Function Tests: No results for input(s): TSH, T4TOTAL, FREET4, T3FREE, THYROIDAB in the last 72 hours. Anemia Panel: No results for input(s): VITAMINB12, FOLATE, FERRITIN, TIBC, IRON, RETICCTPCT in the last 72 hours. Urine analysis:    Component Value Date/Time   COLORURINE YELLOW 09/25/2017 1657   APPEARANCEUR CLEAR 09/25/2017 1657   LABSPEC 1.015 09/25/2017 1657   PHURINE 5.5 09/25/2017 1657   GLUCOSEU NEGATIVE 09/25/2017 1657   HGBUR 1+ (A) 09/25/2017 1657   BILIRUBINUR NEGATIVE 09/22/2016 1240   BILIRUBINUR neg 04/17/2016 1051   KETONESUR TRACE (A) 09/25/2017 1657   PROTEINUR NEGATIVE 09/25/2017 1657   UROBILINOGEN negative 04/17/2016 1051   UROBILINOGEN 0.2 08/11/2014 1539   NITRITE NEGATIVE 09/22/2016 1240   LEUKOCYTESUR 2+ (A) 09/22/2016 1240   Sepsis Labs: @LABRCNTIP (procalcitonin:4,lacticidven:4) )No results found for this or any previous visit (from the past 240 hour(s)).   Radiological Exams on Admission: Dg Chest Portable 1 View  Result Date: 04/05/2019 CLINICAL DATA:  Shortness of breath. EXAM: PORTABLE CHEST 1 VIEW COMPARISON:  June 15, 2017 FINDINGS: The heart size and mediastinal contours are within normal limits. Both lungs are clear. The visualized skeletal structures are unremarkable. IMPRESSION: No active disease. Electronically Signed    By: Dorise Bullion III M.D   On: 04/05/2019 20:00    EKG: Independently reviewed.  It shows normal sinus rhythm with a rate of 101.  Mild ST depression in the lateral leads.  Assessment/Plan Principal Problem:   Chest pain Active Problems:   Hyperlipidemia with target LDL less than 100   Reflux esophagitis   Anxiety   Tremor, essential     #1 chest pain: Transient.  Currently gone.  Risk factors for coronary artery disease include hyperlipidemia elevated blood pressure and female sex.  Patient is going to be admitted for observation.  Serial enzymes to be checked.  Echocardiogram to be done.  Supportive care.  #2 PUI for Covid: Patient has had outpatient test done but not back.  Repeat testing tonight.  Watch and wait for PCR.  Placed on droplet isolation.  #3 hyperlipidemia: Continue with statin.  #4 GERD: Continue PPIs   DVT prophylaxis: Lovenox Code Status: Full code Family Communication: No family at bedside Disposition Plan: Home Consults called: None Admission status: Observation  Severity of Illness: The appropriate patient status for this patient is OBSERVATION. Observation status is judged to be reasonable and necessary in order to provide the required intensity of service to ensure the patient's safety. The patient's presenting symptoms, physical exam findings, and initial radiographic and laboratory data in the context of their medical condition is felt to place them at decreased risk for further clinical deterioration. Furthermore, it is anticipated that the patient will be medically stable for discharge from the hospital within 2 midnights of admission. The following factors support the patient status of observation.   " The patient's presenting symptoms include chest pain. " The physical exam findings include no significant findings on exam. " The initial radiographic and laboratory data are troponin elevated.     Barbette Merino MD [facility] Pager [phone]  If 7PM-7AM, please contact night-coverage [email or web address] Password [email or web address]  04/05/2019, 11:41 PM

## 2019-04-06 ENCOUNTER — Encounter (HOSPITAL_COMMUNITY): Payer: Self-pay | Admitting: *Deleted

## 2019-04-06 ENCOUNTER — Inpatient Hospital Stay (HOSPITAL_COMMUNITY): Payer: Medicare Other

## 2019-04-06 DIAGNOSIS — M810 Age-related osteoporosis without current pathological fracture: Secondary | ICD-10-CM | POA: Diagnosis present

## 2019-04-06 DIAGNOSIS — K5900 Constipation, unspecified: Secondary | ICD-10-CM | POA: Diagnosis present

## 2019-04-06 DIAGNOSIS — R7989 Other specified abnormal findings of blood chemistry: Secondary | ICD-10-CM | POA: Diagnosis not present

## 2019-04-06 DIAGNOSIS — R0789 Other chest pain: Secondary | ICD-10-CM | POA: Diagnosis not present

## 2019-04-06 DIAGNOSIS — K2920 Alcoholic gastritis without bleeding: Secondary | ICD-10-CM | POA: Diagnosis present

## 2019-04-06 DIAGNOSIS — Z79899 Other long term (current) drug therapy: Secondary | ICD-10-CM | POA: Diagnosis not present

## 2019-04-06 DIAGNOSIS — K2100 Gastro-esophageal reflux disease with esophagitis, without bleeding: Secondary | ICD-10-CM | POA: Diagnosis present

## 2019-04-06 DIAGNOSIS — F10239 Alcohol dependence with withdrawal, unspecified: Secondary | ICD-10-CM | POA: Diagnosis present

## 2019-04-06 DIAGNOSIS — Z20828 Contact with and (suspected) exposure to other viral communicable diseases: Secondary | ICD-10-CM | POA: Diagnosis present

## 2019-04-06 DIAGNOSIS — Z96641 Presence of right artificial hip joint: Secondary | ICD-10-CM | POA: Diagnosis present

## 2019-04-06 DIAGNOSIS — R079 Chest pain, unspecified: Secondary | ICD-10-CM | POA: Diagnosis not present

## 2019-04-06 DIAGNOSIS — G250 Essential tremor: Secondary | ICD-10-CM | POA: Diagnosis present

## 2019-04-06 DIAGNOSIS — E785 Hyperlipidemia, unspecified: Secondary | ICD-10-CM

## 2019-04-06 DIAGNOSIS — F1010 Alcohol abuse, uncomplicated: Secondary | ICD-10-CM | POA: Diagnosis not present

## 2019-04-06 DIAGNOSIS — R778 Other specified abnormalities of plasma proteins: Secondary | ICD-10-CM | POA: Diagnosis present

## 2019-04-06 LAB — CBC
HCT: 36.6 % (ref 36.0–46.0)
Hemoglobin: 12.3 g/dL (ref 12.0–15.0)
MCH: 33.8 pg (ref 26.0–34.0)
MCHC: 33.6 g/dL (ref 30.0–36.0)
MCV: 100.5 fL — ABNORMAL HIGH (ref 80.0–100.0)
Platelets: 192 10*3/uL (ref 150–400)
RBC: 3.64 MIL/uL — ABNORMAL LOW (ref 3.87–5.11)
RDW: 13.5 % (ref 11.5–15.5)
WBC: 9 10*3/uL (ref 4.0–10.5)
nRBC: 0 % (ref 0.0–0.2)

## 2019-04-06 LAB — CREATININE, SERUM
Creatinine, Ser: 0.76 mg/dL (ref 0.44–1.00)
GFR calc Af Amer: >60 mL/min (ref >60)
GFR calc non Af Amer: >60 mL/min (ref >60)

## 2019-04-06 LAB — LIPID PANEL
Cholesterol: 240 mg/dL — ABNORMAL HIGH (ref 0–200)
HDL: 95 mg/dL (ref >40)
LDL Cholesterol: 137 mg/dL — ABNORMAL HIGH (ref 0–99)
Total CHOL/HDL Ratio: 2.5 RATIO
Triglycerides: 39 mg/dL (ref <150)
VLDL: 8 mg/dL (ref 0–40)

## 2019-04-06 LAB — SARS CORONAVIRUS 2 (TAT 6-24 HRS): SARS Coronavirus 2: NEGATIVE

## 2019-04-06 LAB — D-DIMER, QUANTITATIVE: D-Dimer, Quant: 0.56 ug/mL-FEU — ABNORMAL HIGH (ref 0.00–0.50)

## 2019-04-06 LAB — TROPONIN I (HIGH SENSITIVITY)
Troponin I (High Sensitivity): 25 ng/L — ABNORMAL HIGH (ref <18)
Troponin I (High Sensitivity): 26 ng/L — ABNORMAL HIGH (ref <18)

## 2019-04-06 LAB — LIPASE, BLOOD: Lipase: 35 U/L (ref 11–51)

## 2019-04-06 MED ORDER — FOLIC ACID 1 MG PO TABS
1.0000 mg | ORAL_TABLET | Freq: Every day | ORAL | Status: DC
  Administered 2019-04-06 – 2019-04-09 (×4): 1 mg via ORAL
  Filled 2019-04-06 (×4): qty 1

## 2019-04-06 MED ORDER — LATANOPROST 0.005 % OP SOLN
1.0000 [drp] | Freq: Every day | OPHTHALMIC | Status: DC
  Administered 2019-04-06 – 2019-04-08 (×3): 1 [drp] via OPHTHALMIC
  Filled 2019-04-06 (×2): qty 2.5

## 2019-04-06 MED ORDER — ENOXAPARIN SODIUM 40 MG/0.4ML ~~LOC~~ SOLN
40.0000 mg | Freq: Every day | SUBCUTANEOUS | Status: DC
  Administered 2019-04-06 – 2019-04-09 (×4): 40 mg via SUBCUTANEOUS
  Filled 2019-04-06 (×4): qty 0.4

## 2019-04-06 MED ORDER — VITAMIN D 25 MCG (1000 UNIT) PO TABS
1000.0000 [IU] | ORAL_TABLET | Freq: Every day | ORAL | Status: DC
  Administered 2019-04-06 – 2019-04-09 (×4): 1000 [IU] via ORAL
  Filled 2019-04-06 (×4): qty 1

## 2019-04-06 MED ORDER — GARLIC 100 MG PO TABS: ORAL_TABLET | Freq: Every day | ORAL | Status: DC

## 2019-04-06 MED ORDER — ONDANSETRON HCL 4 MG/2ML IJ SOLN
4.0000 mg | Freq: Four times a day (QID) | INTRAMUSCULAR | Status: DC | PRN
  Administered 2019-04-07 (×2): 4 mg via INTRAVENOUS
  Filled 2019-04-06 (×3): qty 2

## 2019-04-06 MED ORDER — ACETAMINOPHEN 325 MG PO TABS: 650.0000 mg | ORAL_TABLET | Freq: Four times a day (QID) | ORAL | Status: DC | PRN

## 2019-04-06 MED ORDER — SODIUM CHLORIDE 0.9 % IV SOLN
INTRAVENOUS | Status: DC
  Administered 2019-04-06 – 2019-04-08 (×4): via INTRAVENOUS

## 2019-04-06 MED ORDER — VITAMIN B-1 100 MG PO TABS
100.0000 mg | ORAL_TABLET | Freq: Every day | ORAL | Status: DC
  Administered 2019-04-06 – 2019-04-09 (×4): 100 mg via ORAL
  Filled 2019-04-06 (×4): qty 1

## 2019-04-06 NOTE — ED Notes (Signed)
Patient transported to Ultrasound 

## 2019-04-06 NOTE — ED Notes (Signed)
Daughter at bedside.

## 2019-04-06 NOTE — Progress Notes (Signed)
Progress Note    Rachel Morris  [id number] DOB: [birth year]  DOA: 04/05/2019 PCP: Virgie Dad, MD    Brief Narrative:    Medical records reviewed and are as summarized below:  Rachel Morris is an 85 y.o. female with medical history significant of total hip replacement, hyperlipidemia, insomnia, essential tremor, elevated blood pressure but no diagnosis of hypertension who presents to the ER with chest pressure.  Patient reported GI symptoms with nausea vomiting and abdominal pain with some diarrhea about 3 days ago.  Also viral-like illness at the time.   Assessment/Plan:   Principal Problem:   Chest pain Active Problems:   Hyperlipidemia with target LDL less than 100   Reflux esophagitis   Anxiety   Tremor, essential   N/V/D -alcohol intake has increased over the last few months -trend LFTs -check lipase -RUQ U/S -multiple allergies so will get CIWA w/o ativan -add thiamine and folate  chest pain:  -Transient.   - Risk factors for coronary artery disease include hyperlipidemia elevated blood pressure   -echo pending -CE flat -check d dimer  hyperlipidemia:  -may need to hold statin pending LFTs  GERD:  -Continue PPIs   Family Communication/Anticipated D/C date and plan/Code Status   DVT prophylaxis: Lovenox ordered. Code Status: Full Code.  Family Communication: daughter at bedside Disposition Plan:    Medical Consultants:    None.     Subjective:   Has tremors No further nausea  Objective:    Vitals:   04/06/19 0400 04/06/19 0500 04/06/19 0800 04/06/19 0900  BP: (!) 156/74 (!) 160/73 137/87 (!) 145/70  Pulse: 90  87 (!) 108  Resp: 18  14   Temp:  98.3 F (36.8 C)    TempSrc:      SpO2: 95%  95% (!) 89%  Weight:      Height:        Intake/Output Summary (Last 24 hours) at 04/06/2019 1130 Last data filed at 04/05/2019 2200 Gross per 24 hour  Intake 1000 ml  Output -  Net 1000 ml   Filed Weights   04/05/19  1722  Weight: 53.5 kg    Exam: In bed, pleasant and cooperative +BS, soft, NT-- no RUQ tenderness Tele: NSR No LE edema Hard of hearing  Data Reviewed:   I have personally reviewed following labs and imaging studies:  Labs: Labs show the following:   Basic Metabolic Panel: Recent Labs  Lab 04/05/19 1758 04/06/19 0147  NA 133*  --   K 4.9  --   CL 101  --   CO2 21*  --   GLUCOSE 85  --   BUN 13  --   CREATININE 0.68 0.76  CALCIUM 10.0  --    GFR Estimated Creatinine Clearance: 39.9 mL/min (by C-G formula based on SCr of 0.76 mg/dL). Liver Function Tests: Recent Labs  Lab 04/05/19 1758  AST 82*  ALT 74*  ALKPHOS 83  BILITOT 0.8  PROT 6.8  ALBUMIN 4.0   No results for input(s): LIPASE, AMYLASE in the last 168 hours. No results for input(s): AMMONIA in the last 168 hours. Coagulation profile No results for input(s): INR, PROTIME in the last 168 hours.  CBC: Recent Labs  Lab 04/05/19 1758 04/06/19 0147  WBC 9.3 9.0  NEUTROABS 5.2  --   HGB 13.9 12.3  HCT 41.9 36.6  MCV 103.5* 100.5*  PLT 217 192   Cardiac Enzymes: No results for input(s): CKTOTAL, CKMB, CKMBINDEX,  TROPONINI in the last 168 hours. BNP (last 3 results) No results for input(s): PROBNP in the last 8760 hours. CBG: Recent Labs  Lab 04/05/19 1729  GLUCAP 86   D-Dimer: No results for input(s): DDIMER in the last 72 hours. Hgb A1c: No results for input(s): HGBA1C in the last 72 hours. Lipid Profile: Recent Labs    04/06/19 0147  CHOL 240*  HDL 95  LDLCALC 137*  TRIG 39  CHOLHDL 2.5   Thyroid function studies: No results for input(s): TSH, T4TOTAL, T3FREE, THYROIDAB in the last 72 hours.  Invalid input(s): FREET3 Anemia work up: No results for input(s): VITAMINB12, FOLATE, FERRITIN, TIBC, IRON, RETICCTPCT in the last 72 hours. Sepsis Labs: Recent Labs  Lab 04/05/19 1758 04/06/19 0147  WBC 9.3 9.0    Microbiology Recent Results (from the past 240 hour(s))  SARS  CORONAVIRUS 2 (TAT 6-24 HRS) Nasopharyngeal Nasopharyngeal Swab     Status: None   Collection Time: 04/05/19 11:23 PM   Specimen: Nasopharyngeal Swab  Result Value Ref Range Status   SARS Coronavirus 2 NEGATIVE NEGATIVE Final    Comment: (NOTE) SARS-CoV-2 target nucleic acids are NOT DETECTED. The SARS-CoV-2 RNA is generally detectable in upper and lower respiratory specimens during the acute phase of infection. Negative results do not preclude SARS-CoV-2 infection, do not rule out co-infections with other pathogens, and should not be used as the sole basis for treatment or other patient management decisions. Negative results must be combined with clinical observations, patient history, and epidemiological information. The expected result is Negative. Fact Sheet for Patients: SugarRoll.be Fact Sheet for Healthcare Providers: https://www.woods-mathews.com/ This test is not yet approved or cleared by the [place] FDA and  has been authorized for detection and/or diagnosis of SARS-CoV-2 by FDA under an Emergency Use Authorization (EUA). This EUA will remain  in effect (meaning this test can be used) for the duration of the COVID-19 declaration under Section 56 4(b)(1) of the Act, 21 U.S.C. section 360bbb-3(b)(1), unless the authorization is terminated or revoked sooner. Performed at [facility], [place] [address], [place], [place] [address]     Procedures and diagnostic studies:  Dg Chest Portable 1 View  Result Date: 04/05/2019 CLINICAL DATA:  Shortness of breath. EXAM: PORTABLE CHEST 1 VIEW COMPARISON:  June 15, 2017 FINDINGS: The heart size and mediastinal contours are within normal limits. Both lungs are clear. The visualized skeletal structures are unremarkable. IMPRESSION: No active disease. Electronically Signed   By: Dorise Bullion III M.D   On: 04/05/2019 20:00    Medications:   . cholecalciferol  1,000 Units Oral  Daily  . enoxaparin (LOVENOX) injection  40 mg Subcutaneous Daily  . folic acid  1 mg Oral Daily  . latanoprost  1 drop Both Eyes QHS  . sodium chloride flush  3 mL Intravenous Once  . thiamine  100 mg Oral Daily   Continuous Infusions: . sodium chloride 100 mL/hr at 04/06/19 0150     LOS: 0 days   Geradine Girt  [facility]   How to contact the [facility] Attending or Consulting provider [place] or covering provider during after hours [place], for this patient?  1. Check the care team in [facility] and look for a) attending/consulting TRH provider listed and b) the [facility] team listed 2. Log into [email or web address] and use 's universal password to access. If you do not have the password, please contact the hospital operator. 3. Locate the [facility] provider you are  looking for under [facility] and page to a number that you can be directly reached. 4. If you still have difficulty reaching the provider, please page the [facility] (Director on Call) for the Hospitalists listed on amion for assistance.  04/06/2019, 11:30 AM

## 2019-04-06 NOTE — Progress Notes (Addendum)
Text paged results of D Dimer to Dr. Eliseo Squires of 0.56, returned call, no new orders

## 2019-04-06 NOTE — ED Notes (Signed)
hospitalist at bedside

## 2019-04-06 NOTE — Progress Notes (Signed)
PHARMACIST - PHYSICIAN ORDER COMMUNICATION  CONCERNING: P&T Medication Policy on Herbal Medications  DESCRIPTION:  This patient's order for:  Garlic  has been noted.  This product(s) is classified as an "herbal" or natural product. Due to a lack of definitive safety studies or FDA approval, nonstandard manufacturing practices, plus the potential risk of unknown drug-drug interactions while on inpatient medications, the Pharmacy and Therapeutics Committee does not permit the use of "herbal" or natural products of this type within [facility].   ACTION TAKEN: The pharmacy department is unable to verify this order at this time and your patient has been informed of this safety policy. Please reevaluate patient's clinical condition at discharge and address if the herbal or natural product(s) should be resumed at that time.

## 2019-04-06 NOTE — ED Notes (Signed)
Attempted to call nursing report.  

## 2019-04-07 ENCOUNTER — Inpatient Hospital Stay (HOSPITAL_COMMUNITY): Payer: Medicare Other

## 2019-04-07 DIAGNOSIS — R079 Chest pain, unspecified: Secondary | ICD-10-CM | POA: Diagnosis not present

## 2019-04-07 DIAGNOSIS — R7989 Other specified abnormal findings of blood chemistry: Secondary | ICD-10-CM

## 2019-04-07 LAB — COMPREHENSIVE METABOLIC PANEL
ALT: 38 U/L (ref 0–44)
AST: 33 U/L (ref 15–41)
Albumin: 3 g/dL — ABNORMAL LOW (ref 3.5–5.0)
Alkaline Phosphatase: 71 U/L (ref 38–126)
Anion gap: 10 (ref 5–15)
BUN: 8 mg/dL (ref 8–23)
CO2: 23 mmol/L (ref 22–32)
Calcium: 9.4 mg/dL (ref 8.9–10.3)
Chloride: 105 mmol/L (ref 98–111)
Creatinine, Ser: 0.75 mg/dL (ref 0.44–1.00)
GFR calc Af Amer: >60 mL/min (ref >60)
GFR calc non Af Amer: >60 mL/min (ref >60)
Glucose, Bld: 101 mg/dL — ABNORMAL HIGH (ref 70–99)
Potassium: 3.5 mmol/L (ref 3.5–5.1)
Sodium: 138 mmol/L (ref 135–145)
Total Bilirubin: 0.9 mg/dL (ref 0.3–1.2)
Total Protein: 5.7 g/dL — ABNORMAL LOW (ref 6.5–8.1)

## 2019-04-07 LAB — CBC
HCT: 36.6 % (ref 36.0–46.0)
Hemoglobin: 12.1 g/dL (ref 12.0–15.0)
MCH: 33.7 pg (ref 26.0–34.0)
MCHC: 33.1 g/dL (ref 30.0–36.0)
MCV: 101.9 fL — ABNORMAL HIGH (ref 80.0–100.0)
Platelets: 188 10*3/uL (ref 150–400)
RBC: 3.59 MIL/uL — ABNORMAL LOW (ref 3.87–5.11)
RDW: 13.3 % (ref 11.5–15.5)
WBC: 8 10*3/uL (ref 4.0–10.5)
nRBC: 0 % (ref 0.0–0.2)

## 2019-04-07 LAB — ECHOCARDIOGRAM COMPLETE
Height: 62 in
Weight: 2190.49 oz

## 2019-04-07 MED ORDER — BISACODYL 10 MG RE SUPP
10.0000 mg | Freq: Once | RECTAL | Status: AC
  Administered 2019-04-07: 10 mg via RECTAL
  Filled 2019-04-07: qty 1

## 2019-04-07 MED ORDER — SUCRALFATE 1 G PO TABS
1.0000 g | ORAL_TABLET | Freq: Four times a day (QID) | ORAL | Status: DC
  Administered 2019-04-07 – 2019-04-09 (×8): 1 g via ORAL
  Filled 2019-04-07 (×8): qty 1

## 2019-04-07 MED ORDER — PANTOPRAZOLE SODIUM 40 MG IV SOLR
40.0000 mg | Freq: Two times a day (BID) | INTRAVENOUS | Status: DC
  Administered 2019-04-07 – 2019-04-09 (×5): 40 mg via INTRAVENOUS
  Filled 2019-04-07 (×5): qty 40

## 2019-04-07 MED ORDER — MAGNESIUM HYDROXIDE 400 MG/5ML PO SUSP
30.0000 mL | Freq: Every day | ORAL | Status: DC | PRN
  Administered 2019-04-07 – 2019-04-09 (×2): 30 mL via ORAL
  Filled 2019-04-07 (×2): qty 30

## 2019-04-07 MED ORDER — LORAZEPAM 2 MG/ML IJ SOLN
1.0000 mg | INTRAMUSCULAR | Status: DC | PRN
  Administered 2019-04-07 (×2): 2 mg via INTRAVENOUS
  Administered 2019-04-07: 23:00:00 1 mg via INTRAVENOUS
  Filled 2019-04-07 (×4): qty 1

## 2019-04-07 MED ORDER — LORAZEPAM 1 MG PO TABS: 1.0000 mg | ORAL_TABLET | ORAL | Status: DC | PRN

## 2019-04-07 NOTE — Progress Notes (Signed)
  Echocardiogram 2D Echocardiogram has been performed.  Rachel Morris 04/07/2019, 2:38 PM

## 2019-04-07 NOTE — Plan of Care (Signed)
  Problem: Health Behavior/Discharge Planning: Goal: Ability to manage health-related needs will improve Outcome: Progressing   Problem: Clinical Measurements: Goal: Ability to maintain clinical measurements within normal limits will improve Outcome: Progressing Goal: Will remain free from infection Outcome: Progressing   Problem: Coping: Goal: Level of anxiety will decrease Outcome: Progressing   Problem: Clinical Measurements: Goal: Diagnostic test results will improve Outcome: Completed/Met Goal: Cardiovascular complication will be avoided Outcome: Completed/Met   Problem: Elimination: Goal: Will not experience complications related to urinary retention Outcome: Completed/Met   Problem: Pain Managment: Goal: General experience of comfort will improve Outcome: Completed/Met

## 2019-04-07 NOTE — Progress Notes (Signed)
Progress Note    Rachel Morris  [id number] DOB: [birth year]  DOA: 04/05/2019 PCP: Virgie Dad, MD    Brief Narrative:    Medical records reviewed and are as summarized below:  Rachel Morris is an 85 y.o. female from [place] at [place] with medical history significant of total hip replacement, hyperlipidemia, insomnia, essential tremor, elevated blood pressure but no diagnosis of hypertension who presents to the ER with chest pressure.  Patient reported GI symptoms with nausea vomiting and abdominal pain with some diarrhea about 3 days ago.  Also viral-like illness at the time.   Assessment/Plan:   Principal Problem:   Chest pain Active Problems:   Hyperlipidemia with target LDL less than 100   Reflux esophagitis   Anxiety   Tremor, essential   N/V/D -alcohol intake has increased over the last few months -trend LFTs -lipase negative -RUQ U/S suggestive of steatosis and possible cirrhosis -add CIWA -add thiamine and folate -PPI BID -carafate -liquid diet  Constipation -suppository  chest pain:  -Transient.   - Risk factors for coronary artery disease include hyperlipidemia elevated blood pressure   -echo pending -CE flat -d dimer not positive age adjusted  hyperlipidemia:  -LFTs back to normal, suspect was from alcohol use  GERD:  -BID PPI   Family Communication/Anticipated D/C date and plan/Code Status   DVT prophylaxis: Lovenox ordered. Code Status: Full Code.  Family Communication: daughter at bedside Disposition Plan: pending tolerance of diet-- ? Back to ILF vs SNF (From [place])   Medical Consultants:    None.     Subjective:   Nausea with vomiting today Says she feels like she is sitting on a rock  Objective:    Vitals:   04/06/19 1348 04/06/19 1937 04/07/19 0459 04/07/19 0823  BP:  (!) 151/73 (!) 159/72 (!) 152/71  Pulse:  96 86 87  Resp:  16 16 17   Temp:  98.9 F (37.2 C) 98.3 F (36.8 C) 98.2 F  (36.8 C)  TempSrc:  Oral Oral Oral  SpO2:  98% 98% 98%  Weight: 62.8 kg  62.1 kg   Height:        Intake/Output Summary (Last 24 hours) at 04/07/2019 1010 Last data filed at 04/07/2019 0825 Gross per 24 hour  Intake 2529.11 ml  Output 500 ml  Net 2029.11 ml   Filed Weights   04/05/19 1722 04/06/19 1348 04/07/19 0459  Weight: 53.5 kg 62.8 kg 62.1 kg    Exam: On side of bed, uncomfortable appearing No increased work of breathing +BS, soft, NT No LE edema Pleasant but confused and hard of hearing   Data Reviewed:   I have personally reviewed following labs and imaging studies:  Labs: Labs show the following:   Basic Metabolic Panel: Recent Labs  Lab 04/05/19 1758 04/06/19 0147 04/07/19 0455  NA 133*  --  138  K 4.9  --  3.5  CL 101  --  105  CO2 21*  --  23  GLUCOSE 85  --  101*  BUN 13  --  8  CREATININE 0.68 0.76 0.75  CALCIUM 10.0  --  9.4   GFR Estimated Creatinine Clearance: 43.7 mL/min (by C-G formula based on SCr of 0.75 mg/dL). Liver Function Tests: Recent Labs  Lab 04/05/19 1758 04/07/19 0455  AST 82* 33  ALT 74* 38  ALKPHOS 83 71  BILITOT 0.8 0.9  PROT 6.8 5.7*  ALBUMIN 4.0 3.0*   Recent Labs  Lab 04/06/19 1429  LIPASE 35   No results for input(s): AMMONIA in the last 168 hours. Coagulation profile No results for input(s): INR, PROTIME in the last 168 hours.  CBC: Recent Labs  Lab 04/05/19 1758 04/06/19 0147 04/07/19 0455  WBC 9.3 9.0 8.0  NEUTROABS 5.2  --   --   HGB 13.9 12.3 12.1  HCT 41.9 36.6 36.6  MCV 103.5* 100.5* 101.9*  PLT 217 192 188   Cardiac Enzymes: No results for input(s): CKTOTAL, CKMB, CKMBINDEX, TROPONINI in the last 168 hours. BNP (last 3 results) No results for input(s): PROBNP in the last 8760 hours. CBG: Recent Labs  Lab 04/05/19 1729  GLUCAP 86   D-Dimer: Recent Labs    04/06/19 1429  DDIMER 0.56*   Hgb A1c: No results for input(s): HGBA1C in the last 72 hours. Lipid Profile: Recent  Labs    04/06/19 0147  CHOL 240*  HDL 95  LDLCALC 137*  TRIG 39  CHOLHDL 2.5   Thyroid function studies: No results for input(s): TSH, T4TOTAL, T3FREE, THYROIDAB in the last 72 hours.  Invalid input(s): FREET3 Anemia work up: No results for input(s): VITAMINB12, FOLATE, FERRITIN, TIBC, IRON, RETICCTPCT in the last 72 hours. Sepsis Labs: Recent Labs  Lab 04/05/19 1758 04/06/19 0147 04/07/19 0455  WBC 9.3 9.0 8.0    Microbiology Recent Results (from the past 240 hour(s))  SARS CORONAVIRUS 2 (TAT 6-24 HRS) Nasopharyngeal Nasopharyngeal Swab     Status: None   Collection Time: 04/05/19 11:23 PM   Specimen: Nasopharyngeal Swab  Result Value Ref Range Status   SARS Coronavirus 2 NEGATIVE NEGATIVE Final    Comment: (NOTE) SARS-CoV-2 target nucleic acids are NOT DETECTED. The SARS-CoV-2 RNA is generally detectable in upper and lower respiratory specimens during the acute phase of infection. Negative results do not preclude SARS-CoV-2 infection, do not rule out co-infections with other pathogens, and should not be used as the sole basis for treatment or other patient management decisions. Negative results must be combined with clinical observations, patient history, and epidemiological information. The expected result is Negative. Fact Sheet for Patients: SugarRoll.be Fact Sheet for Healthcare Providers: https://www.woods-mathews.com/ This test is not yet approved or cleared by the [place] FDA and  has been authorized for detection and/or diagnosis of SARS-CoV-2 by FDA under an Emergency Use Authorization (EUA). This EUA will remain  in effect (meaning this test can be used) for the duration of the COVID-19 declaration under Section 56 4(b)(1) of the Act, 21 U.S.C. section 360bbb-3(b)(1), unless the authorization is terminated or revoked sooner. Performed at [facility], [place] [address] [place], [place] [address]      Procedures and diagnostic studies:  Dg Chest Portable 1 View  Result Date: 04/05/2019 CLINICAL DATA:  Shortness of breath. EXAM: PORTABLE CHEST 1 VIEW COMPARISON:  June 15, 2017 FINDINGS: The heart size and mediastinal contours are within normal limits. Both lungs are clear. The visualized skeletal structures are unremarkable. IMPRESSION: No active disease. Electronically Signed   By: Dorise Bullion III M.D   On: 04/05/2019 20:00   US Abdomen Limited Ruq  Result Date: 04/06/2019 CLINICAL DATA:  Chest pressure EXAM: ULTRASOUND ABDOMEN LIMITED RIGHT UPPER QUADRANT COMPARISON:  None. FINDINGS: Gallbladder: No gallstones or wall thickening visualized. No sonographic Murphy sign noted by sonographer. Common bile duct: Diameter: 5-6 mm Liver: Moderately heterogeneous echotexture with suggestion of nodular hepatic contour and increased echogenicity. No focal lesion though limited by baseline increased echogenicity. Portal vein is  patent on color Doppler imaging with normal direction of blood flow towards the liver. Other: Trace pleural fluid noted in the right chest. IMPRESSION: 1. Findings that suggest steatosis with possible cirrhosis. Correlation with clinical and laboratory evidence of liver disease is suggested. 2. Common bile duct at upper limits of normal given gallbladder remains in place. Correlate with any clinical concern for biliary colic. This may represent what is normal for this patient at this age however. 3. Trace right pleural effusion. Electronically Signed   By: Zetta Bills M.D.   On: 04/06/2019 12:52    Medications:   . bisacodyl  10 mg Rectal Once  . cholecalciferol  1,000 Units Oral Daily  . enoxaparin (LOVENOX) injection  40 mg Subcutaneous Daily  . folic acid  1 mg Oral Daily  . latanoprost  1 drop Both Eyes QHS  . pantoprazole (PROTONIX) IV  40 mg Intravenous Q12H  . sodium chloride flush  3 mL Intravenous Once  . sucralfate  1 g Oral Q6H  . thiamine  100 mg Oral  Daily   Continuous Infusions: . sodium chloride 50 mL/hr at 04/07/19 0938     LOS: 1 day   Geradine Girt  [facility]   How to contact the [facility] Attending or Consulting provider [place] or covering provider during after hours [place], for this patient?  1. Check the care team in [facility] and look for a) attending/consulting TRH provider listed and b) the [facility] team listed 2. Log into [email or web address] and use [place]'s universal password to access. If you do not have the password, please contact the hospital operator. 3. Locate the [facility] provider you are looking for under [facility] and page to a number that you can be directly reached. 4. If you still have difficulty reaching the provider, please page the [facility] (Director on Call) for the Hospitalists listed on amion for assistance.  04/07/2019, 10:10 AM

## 2019-04-07 NOTE — Plan of Care (Signed)
  Problem: Activity: Goal: Risk for activity intolerance will decrease Outcome: Progressing   Problem: Skin Integrity: Goal: Risk for impaired skin integrity will decrease Outcome: Progressing   

## 2019-04-08 DIAGNOSIS — F1010 Alcohol abuse, uncomplicated: Secondary | ICD-10-CM

## 2019-04-08 DIAGNOSIS — K2100 Gastro-esophageal reflux disease with esophagitis, without bleeding: Secondary | ICD-10-CM | POA: Diagnosis not present

## 2019-04-08 DIAGNOSIS — R7989 Other specified abnormal findings of blood chemistry: Secondary | ICD-10-CM | POA: Diagnosis not present

## 2019-04-08 LAB — COMPREHENSIVE METABOLIC PANEL
ALT: 31 U/L (ref 0–44)
AST: 24 U/L (ref 15–41)
Albumin: 2.9 g/dL — ABNORMAL LOW (ref 3.5–5.0)
Alkaline Phosphatase: 60 U/L (ref 38–126)
Anion gap: 6 (ref 5–15)
BUN: 7 mg/dL — ABNORMAL LOW (ref 8–23)
CO2: 23 mmol/L (ref 22–32)
Calcium: 9 mg/dL (ref 8.9–10.3)
Chloride: 106 mmol/L (ref 98–111)
Creatinine, Ser: 0.71 mg/dL (ref 0.44–1.00)
GFR calc Af Amer: >60 mL/min (ref >60)
GFR calc non Af Amer: >60 mL/min (ref >60)
Glucose, Bld: 100 mg/dL — ABNORMAL HIGH (ref 70–99)
Potassium: 3.4 mmol/L — ABNORMAL LOW (ref 3.5–5.1)
Sodium: 135 mmol/L (ref 135–145)
Total Bilirubin: 0.6 mg/dL (ref 0.3–1.2)
Total Protein: 5.2 g/dL — ABNORMAL LOW (ref 6.5–8.1)

## 2019-04-08 LAB — CBC
HCT: 35.1 % — ABNORMAL LOW (ref 36.0–46.0)
Hemoglobin: 11.9 g/dL — ABNORMAL LOW (ref 12.0–15.0)
MCH: 34.4 pg — ABNORMAL HIGH (ref 26.0–34.0)
MCHC: 33.9 g/dL (ref 30.0–36.0)
MCV: 101.4 fL — ABNORMAL HIGH (ref 80.0–100.0)
Platelets: 175 10*3/uL (ref 150–400)
RBC: 3.46 MIL/uL — ABNORMAL LOW (ref 3.87–5.11)
RDW: 13.2 % (ref 11.5–15.5)
WBC: 8.3 10*3/uL (ref 4.0–10.5)
nRBC: 0 % (ref 0.0–0.2)

## 2019-04-08 LAB — MAGNESIUM: Magnesium: 2.1 mg/dL (ref 1.7–2.4)

## 2019-04-08 MED ORDER — SALINE SPRAY 0.65 % NA SOLN
1.0000 | NASAL | Status: DC | PRN
  Filled 2019-04-08: qty 44

## 2019-04-08 MED ORDER — POTASSIUM CHLORIDE CRYS ER 20 MEQ PO TBCR
40.0000 meq | EXTENDED_RELEASE_TABLET | Freq: Once | ORAL | Status: AC
  Administered 2019-04-08: 11:00:00 40 meq via ORAL
  Filled 2019-04-08: qty 2

## 2019-04-08 NOTE — Progress Notes (Addendum)
Progress Note    [name]  [id number] DOB: August 25, 1930  DOA: 04/05/2019 PCP: Virgie Dad, MD    Brief Narrative:    Medical records reviewed and are as summarized below:  Rachel Morris is an 85 y.o. female from [place] at [place] with medical history significant of total hip replacement, hyperlipidemia, insomnia, essential tremor, elevated blood pressure but no diagnosis of hypertension who presents to the ER with chest pressure.  Patient reported GI symptoms with nausea vomiting and abdominal pain with some diarrhea about 3 days ago.  Also viral-like illness at the time.   Admits to heavy alcohol use over the last several weeks: several in the AM (when she got shaky), the PM, before bed and throughout the night. So I suspect most of her symptoms were due to the increased alcohol intake.  Assessment/Plan:   Principal Problem:   Chest pain Active Problems:   Hyperlipidemia with target LDL less than 100   Reflux esophagitis   Anxiety   Tremor, essential   Elevated LFTs   N/V/D -alcohol intake has increased over the last few months - LFTs trending down -lipase negative -RUQ U/S suggestive of steatosis and possible cirrhosis -added CIWA- improved after ativan yesterday -added thiamine and folate -PPI BID -carafate -liquid diet- advance as tolerated  Constipation -suppository -resolved  chest pain:  -Transient.   - Risk factors for coronary artery disease include hyperlipidemia elevated blood pressure   -echo: preserved EF -CE flat -d dimer not positive age adjusted  hyperlipidemia:  -LFTs back to normal, suspect was from alcohol use -continue statin  GERD:  -BID PPI   PT consult: from ILF at [place] Communication/Anticipated D/C date and plan/Code Status   DVT prophylaxis: Lovenox ordered. Code Status: Full Code.  Family Communication: daughter on phone 12/8 Disposition Plan: PT eval   Medical Consultants:     None.     Subjective:   No vomiting today  Objective:    Vitals:   04/07/19 1946 04/07/19 2356 04/07/19 2356 04/08/19 0619  BP: 139/65 (!) 145/62  (!) 135/58  Pulse: 91  85 74  Resp:      Temp: 98.8 F (37.1 C)  98.3 F (36.8 C) 98.6 F (37 C)  TempSrc: Oral  Oral Oral  SpO2: 97%  93% 98%  Weight:    65.3 kg  Height:        Intake/Output Summary (Last 24 hours) at 04/08/2019 0836 Last data filed at 04/08/2019 0742 Gross per 24 hour  Intake 1517.95 ml  Output 1550 ml  Net -32.05 ml   Filed Weights   04/06/19 1348 04/07/19 0459 04/08/19 0619  Weight: 62.8 kg 62.1 kg 65.3 kg    Exam: In bed, mild tremors rrr No increased work of breathing No LE edema Alert and pleasant   Data Reviewed:   I have personally reviewed following labs and imaging studies:  Labs: Labs show the following:   Basic Metabolic Panel: Recent Labs  Lab 04/05/19 1758 04/06/19 0147 04/07/19 0455 04/08/19 0327  NA 133*  --  138 135  K 4.9  --  3.5 3.4*  CL 101  --  105 106  CO2 21*  --  23 23  GLUCOSE 85  --  101* 100*  BUN 13  --  8 7*  CREATININE 0.68 0.76 0.75 0.71  CALCIUM 10.0  --  9.4 9.0   GFR Estimated Creatinine Clearance: 44.8 mL/min (by C-G formula  based on SCr of 0.71 mg/dL). Liver Function Tests: Recent Labs  Lab 04/05/19 1758 04/07/19 0455 04/08/19 0327  AST 82* 33 24  ALT 74* 38 31  ALKPHOS 83 71 60  BILITOT 0.8 0.9 0.6  PROT 6.8 5.7* 5.2*  ALBUMIN 4.0 3.0* 2.9*   Recent Labs  Lab 04/06/19 1429  LIPASE 35   No results for input(s): AMMONIA in the last 168 hours. Coagulation profile No results for input(s): INR, PROTIME in the last 168 hours.  CBC: Recent Labs  Lab 04/05/19 1758 04/06/19 0147 04/07/19 0455 04/08/19 0327  WBC 9.3 9.0 8.0 8.3  NEUTROABS 5.2  --   --   --   HGB 13.9 12.3 12.1 11.9*  HCT 41.9 36.6 36.6 35.1*  MCV 103.5* 100.5* 101.9* 101.4*  PLT 217 192 188 175   Cardiac Enzymes: No results for input(s): CKTOTAL, CKMB,  CKMBINDEX, TROPONINI in the last 168 hours. BNP (last 3 results) No results for input(s): PROBNP in the last 8760 hours. CBG: Recent Labs  Lab 04/05/19 1729  GLUCAP 86   D-Dimer: Recent Labs    04/06/19 1429  DDIMER 0.56*   Hgb A1c: No results for input(s): HGBA1C in the last 72 hours. Lipid Profile: Recent Labs    04/06/19 0147  CHOL 240*  HDL 95  LDLCALC 137*  TRIG 39  CHOLHDL 2.5   Thyroid function studies: No results for input(s): TSH, T4TOTAL, T3FREE, THYROIDAB in the last 72 hours.  Invalid input(s): FREET3 Anemia work up: No results for input(s): VITAMINB12, FOLATE, FERRITIN, TIBC, IRON, RETICCTPCT in the last 72 hours. Sepsis Labs: Recent Labs  Lab 04/05/19 1758 04/06/19 0147 04/07/19 0455 04/08/19 0327  WBC 9.3 9.0 8.0 8.3    Microbiology Recent Results (from the past 240 hour(s))  SARS CORONAVIRUS 2 (TAT 6-24 HRS) Nasopharyngeal Nasopharyngeal Swab     Status: None   Collection Time: 04/05/19 11:23 PM   Specimen: Nasopharyngeal Swab  Result Value Ref Range Status   SARS Coronavirus 2 NEGATIVE NEGATIVE Final    Comment: (NOTE) SARS-CoV-2 target nucleic acids are NOT DETECTED. The SARS-CoV-2 RNA is generally detectable in upper and lower respiratory specimens during the acute phase of infection. Negative results do not preclude SARS-CoV-2 infection, do not rule out co-infections with other pathogens, and should not be used as the sole basis for treatment or other patient management decisions. Negative results must be combined with clinical observations, patient history, and epidemiological information. The expected result is Negative. Fact Sheet for Patients: SugarRoll.be Fact Sheet for Healthcare Providers: https://www.woods-mathews.com/ This test is not yet approved or cleared by the [place] FDA and  has been authorized for detection and/or diagnosis of SARS-CoV-2 by FDA under an Emergency Use  Authorization (EUA). This EUA will remain  in effect (meaning this test can be used) for the duration of the COVID-19 declaration under Section 56 4(b)(1) of the Act, 21 U.S.C. section 360bbb-3(b)(1), unless the authorization is terminated or revoked sooner. Performed at [facility], [place] [address], [place], [place] [address]     Procedures and diagnostic studies:  US Abdomen Limited Ruq  Result Date: 04/06/2019 CLINICAL DATA:  Chest pressure EXAM: ULTRASOUND ABDOMEN LIMITED RIGHT UPPER QUADRANT COMPARISON:  None. FINDINGS: Gallbladder: No gallstones or wall thickening visualized. No sonographic Murphy sign noted by sonographer. Common bile duct: Diameter: 5-6 mm Liver: Moderately heterogeneous echotexture with suggestion of nodular hepatic contour and increased echogenicity. No focal lesion though limited by baseline increased echogenicity. Portal vein is  patent on color Doppler imaging with normal direction of blood flow towards the liver. Other: Trace pleural fluid noted in the right chest. IMPRESSION: 1. Findings that suggest steatosis with possible cirrhosis. Correlation with clinical and laboratory evidence of liver disease is suggested. 2. Common bile duct at upper limits of normal given gallbladder remains in place. Correlate with any clinical concern for biliary colic. This may represent what is normal for this patient at this age however. 3. Trace right pleural effusion. Electronically Signed   By: Zetta Bills M.D.   On: 04/06/2019 12:52    Medications:   . cholecalciferol  1,000 Units Oral Daily  . enoxaparin (LOVENOX) injection  40 mg Subcutaneous Daily  . folic acid  1 mg Oral Daily  . latanoprost  1 drop Both Eyes QHS  . pantoprazole (PROTONIX) IV  40 mg Intravenous Q12H  . sodium chloride flush  3 mL Intravenous Once  . sucralfate  1 g Oral Q6H  . thiamine  100 mg Oral Daily   Continuous Infusions: . sodium chloride 50 mL/hr at 04/08/19 0634     LOS: 2 days    Geradine Girt  [facility]   How to contact the [facility] Attending or Consulting provider [place] or covering provider during after hours [place], for this patient?  1. Check the care team in [facility] and look for a) attending/consulting TRH provider listed and b) the [facility] team listed 2. Log into [email or web address] and use [place]'s universal password to access. If you do not have the password, please contact the hospital operator. 3. Locate the [facility] provider you are looking for under [facility] and page to a number that you can be directly reached. 4. If you still have difficulty reaching the provider, please page the [facility] (Director on Call) for the Hospitalists listed on amion for assistance.  04/08/2019, 8:36 AM

## 2019-04-08 NOTE — Evaluation (Addendum)
Physical Therapy Evaluation Patient Details Name: Rachel Morris MRN: [id number] DOB: 27-May-1930 Today's Date: 04/08/2019   History of Present Illness  Pt is a pleasant 85 yo female presenting from [place] 12/5 due to c/o chest pressure, N/V, and GI issues. Concern for viral issue, pt COVID neg. PMH sig for THR 2017, HLD, high BP with no dx of HTN, anxiety, and reports of recent increase in alcohol consumption.  Clinical Impression  Pt in bed upon arrival of PT, but agreeable to PT session. The pt was able to demo good balance and safety with transfers using RW and min guard only for safety and line management. The pt demos good ambulation endurance (500 ft with RW; gait speed: 0.4 m/s), but reports that she can tell her strength and endurance are limited. Despite no LOB with mobility, the pt had multiple instances of drifting to the R with the RW (requiring minA to reposition) and hitting multiple objects in the hallway with her RW. The pt was apologetic and able to initiate repositioning of the RW, but remained unable to avoid the obstacles without assist or cues. The pt will continue to benefit from skilled PT to improve safety and independence with mobility prior to return to her ILF.     Follow Up Recommendations Home health PT;Supervision for mobility/OOB (Pt will benefit from PT follow-up after return to ILF for continued strengthening and balance training wether on [facility] or OP basis depending on what is available with her ILF.)    Equipment Recommendations  None recommended by PT(pt has RW)    Recommendations for Other Services       Precautions / Restrictions Precautions Precautions: Fall Restrictions Weight Bearing Restrictions: No      Mobility  Bed Mobility Overal bed mobility: Needs Assistance Bed Mobility: Supine to Sit     Supine to sit: Min guard     General bed mobility comments: min guard for safety, management of lines, pt did not need assist to come to sitting EOB  or repositioning at EOB  Transfers Overall transfer level: Needs assistance Equipment used: Rolling walker (2 wheeled);1 person hand held assist Transfers: Sit to/from Stand Sit to Stand: Supervision         General transfer comment: Pt able to transfer from bed to [facility] and then from [facility] to standing/walking without assist, but benefits from supervision for line managment, pt eager to ambulate, but not impulsive.  Ambulation/Gait Ambulation/Gait assistance: Min guard;Min assist Gait Distance (Feet): 500 Feet Assistive device: Rolling walker (2 wheeled) Gait Pattern/deviations: Step-through pattern;Drifts right/left Gait velocity: 0.4 m/s Gait velocity interpretation: <1.8 ft/sec, indicate of risk for recurrent falls General Gait Details: Pt eager to ambulate, reports ambulation makes her realize how weak she has become. Pt with generally functional gait, but had multiple instances of drifting to R which resulted in multiple collisions with objects in hallway. The pt was always apologetic and noticed her mistakes, but was unable to take preventative action to avoid collisions.  Stairs            Wheelchair Mobility    Modified Rankin (Stroke Patients Only)       Balance Overall balance assessment: Needs assistance Sitting-balance support: Feet supported;No upper extremity supported Sitting balance-Leahy Scale: Good Sitting balance - Comments: superivion for safety   Standing balance support: Bilateral upper extremity supported;During functional activity Standing balance-Leahy Scale: Fair Standing balance comment: pt able to usse single UE support to navigate to sink to wash hands while standing  Pertinent Vitals/Pain Pain Assessment: No/denies pain    Home Living Family/patient expects to be discharged to:: ([place]: [facility])                      Prior Function Level of Independence:  Independent;Independent with assistive device(s)         Comments: Pt reports independent with use of RW in home. Reports sig decline in activity due to [place], but that she is able to move around in her apt with a RW, and live without assist.     Hand Dominance   Dominant Hand: Right    Extremity/Trunk Assessment   Upper Extremity Assessment Upper Extremity Assessment: Overall WFL for tasks assessed    Lower Extremity Assessment Lower Extremity Assessment: Overall WFL for tasks assessed(Pt reports feeling generally weaker, but was able to demo functional ambulation and exercises without obvious deficit.)    Cervical / Trunk Assessment Cervical / Trunk Assessment: Normal  Communication   Communication: No difficulties  Cognition Arousal/Alertness: Awake/alert Behavior During Therapy: WFL for tasks assessed/performed Overall Cognitive Status: Within Functional Limits for tasks assessed Area of Impairment: Problem solving;Safety/judgement                         Safety/Judgement: Decreased awareness of safety   Problem Solving: Slow processing;Decreased initiation General Comments: Pt with generally functional cog and memory, pt well-oriented to situation, but demos mobility/safety deficits that she was unable to correct without cues/assist.      General Comments      Exercises General Exercises - Lower Extremity Long Arc Quad: Strengthening;Both;10 reps;Seated Hip Flexion/Marching: Strengthening;Both;10 reps Heel Raises: Strengthening;Both;10 reps   Assessment/Plan    PT Assessment Patient needs continued PT services  PT Problem List Decreased mobility;Decreased activity tolerance;Decreased balance;Decreased coordination;Decreased strength       PT Treatment Interventions Gait training;Therapeutic exercise;DME instruction;Balance training;Stair training;Functional mobility training;Therapeutic activities;Patient/family education    PT Goals  (Current goals can be found in the Care Plan section)  Acute Rehab PT Goals Patient Stated Goal: to return home and get stronger PT Goal Formulation: With patient Time For Goal Achievement: 04/22/19 Potential to Achieve Goals: Good    Frequency Min 2X/week   Barriers to discharge        Co-evaluation               AM-PAC PT "6 Clicks" Mobility  Outcome Measure Help needed turning from your back to your side while in a flat bed without using bedrails?: A Little Help needed moving from lying on your back to sitting on the side of a flat bed without using bedrails?: A Little Help needed moving to and from a bed to a chair (including a wheelchair)?: A Little Help needed standing up from a chair using your arms (e.g., wheelchair or bedside chair)?: A Little Help needed to walk in hospital room?: A Little Help needed climbing 3-5 steps with a railing? : A Lot 6 Click Score: 17    End of Session Equipment Utilized During Treatment: Gait belt Activity Tolerance: Patient tolerated treatment well Patient left: in chair;with chair alarm set;with call bell/phone within reach Nurse Communication: Mobility status PT Visit Diagnosis: Unsteadiness on feet (R26.81);Difficulty in walking, not elsewhere classified (R26.2)    Time: [id number] PT Time Calculation (min) (ACUTE ONLY): 27 min   Charges:   PT Evaluation $PT Eval Low Complexity: 1 Low PT Treatments $Gait Training: 8-22 mins  Mickey Farber, PT, DPT   Acute Rehabilitation Department [phone]  Otho Bellows 04/08/2019, 1:58 PM

## 2019-04-09 DIAGNOSIS — K2970 Gastritis, unspecified, without bleeding: Secondary | ICD-10-CM

## 2019-04-09 DIAGNOSIS — R0789 Other chest pain: Secondary | ICD-10-CM

## 2019-04-09 LAB — COMPREHENSIVE METABOLIC PANEL
ALT: 32 U/L (ref 0–44)
AST: 24 U/L (ref 15–41)
Albumin: 3 g/dL — ABNORMAL LOW (ref 3.5–5.0)
Alkaline Phosphatase: 70 U/L (ref 38–126)
Anion gap: 9 (ref 5–15)
BUN: 6 mg/dL — ABNORMAL LOW (ref 8–23)
CO2: 23 mmol/L (ref 22–32)
Calcium: 9.7 mg/dL (ref 8.9–10.3)
Chloride: 102 mmol/L (ref 98–111)
Creatinine, Ser: 0.7 mg/dL (ref 0.44–1.00)
GFR calc Af Amer: >60 mL/min (ref >60)
GFR calc non Af Amer: >60 mL/min (ref >60)
Glucose, Bld: 102 mg/dL — ABNORMAL HIGH (ref 70–99)
Potassium: 3.8 mmol/L (ref 3.5–5.1)
Sodium: 134 mmol/L — ABNORMAL LOW (ref 135–145)
Total Bilirubin: 0.8 mg/dL (ref 0.3–1.2)
Total Protein: 6.1 g/dL — ABNORMAL LOW (ref 6.5–8.1)

## 2019-04-09 LAB — CBC WITH DIFFERENTIAL/PLATELET
Abs Immature Granulocytes: 0.05 10*3/uL (ref 0.00–0.07)
Basophils Absolute: 0 10*3/uL (ref 0.0–0.1)
Basophils Relative: 1 %
Eosinophils Absolute: 0.1 10*3/uL (ref 0.0–0.5)
Eosinophils Relative: 2 %
HCT: 37.9 % (ref 36.0–46.0)
Hemoglobin: 12.6 g/dL (ref 12.0–15.0)
Immature Granulocytes: 1 %
Lymphocytes Relative: 18 %
Lymphs Abs: 1.5 10*3/uL (ref 0.7–4.0)
MCH: 34.1 pg — ABNORMAL HIGH (ref 26.0–34.0)
MCHC: 33.2 g/dL (ref 30.0–36.0)
MCV: 102.7 fL — ABNORMAL HIGH (ref 80.0–100.0)
Monocytes Absolute: 1.4 10*3/uL — ABNORMAL HIGH (ref 0.1–1.0)
Monocytes Relative: 17 %
Neutro Abs: 5.3 10*3/uL (ref 1.7–7.7)
Neutrophils Relative %: 61 %
Platelets: 199 10*3/uL (ref 150–400)
RBC: 3.69 MIL/uL — ABNORMAL LOW (ref 3.87–5.11)
RDW: 13.2 % (ref 11.5–15.5)
WBC: 8.4 10*3/uL (ref 4.0–10.5)
nRBC: 0 % (ref 0.0–0.2)

## 2019-04-09 LAB — MAGNESIUM: Magnesium: 2 mg/dL (ref 1.7–2.4)

## 2019-04-09 MED ORDER — GABAPENTIN 300 MG PO CAPS: 300.0000 mg | ORAL_CAPSULE | Freq: Every day | ORAL | 0 refills | Status: DC

## 2019-04-09 MED ORDER — OMEPRAZOLE 40 MG PO CPDR: 40.0000 mg | DELAYED_RELEASE_CAPSULE | Freq: Every day | ORAL | 0 refills | Status: DC

## 2019-04-09 NOTE — Plan of Care (Signed)
  Problem: Clinical Measurements: Goal: Ability to maintain clinical measurements within normal limits will improve Outcome: Progressing Goal: Will remain free from infection Outcome: Progressing   Problem: Health Behavior/Discharge Planning: Goal: Ability to manage health-related needs will improve Outcome: Progressing

## 2019-04-09 NOTE — TOC Transition Note (Signed)
Transition of Care [facility]) - CM/SW Discharge Note   Patient Details  Name: Rachel Morris MRN: [id number] Date of Birth: 01-Mar-1931  Transition of [facility] CM/SW Contact:  Eileen Stanford, LCSW Phone Number: 04/09/2019, 2:35 PM   Clinical Narrative: Pt is [facility] up with [facility] through [facility]. Pt is cleared for d/c per CSW standpoint.      Final next level of care: Home w Home Health Services Barriers to Discharge: No Barriers Identified   Patient Goals and CMS Choice        Discharge Placement              Patient chooses bed at: [facility], [place]) Patient to be transferred to facility by: Daughter Name of family member notified: [name], daughter Patient and family notified of of transfer: 04/09/19  Discharge Plan and Services In-house Referral: Clinical Social Work   Post Acute Care Choice: Home Health                    HH Arranged: PT, OT, RN, Social Work [employer] Agency: [place] ([place]) Date [place]: 04/09/19 Time [place]: 1432 Representative spoke with at [place]: [place] ([place]) Interventions     Readmission Risk Interventions No flowsheet data found.

## 2019-04-09 NOTE — Discharge Instructions (Signed)
Alcohol Withdrawal Syndrome When a person who drinks a lot of alcohol stops drinking, he or she may have unpleasant and serious symptoms. These symptoms are called alcohol withdrawal syndrome. This condition may be mild or severe. It can be life-threatening. It can cause:  Shaking that you cannot control (tremor).  Sweating.  Headache.  Feeling fearful, upset, grouchy, or depressed.  Trouble sleeping (insomnia).  Nightmares.  Fast or uneven heartbeats (palpitations).  Alcohol cravings.  Feeling sick to your stomach (nausea).  Throwing up (vomiting).  Being bothered by light and sounds.  Confusion.  Trouble thinking clearly.  Not being hungry (loss of appetite).  Big changes in mood (mood swings). If you have all of the following symptoms at the same time, get help right away:  High blood pressure.  Fast heartbeat.  Trouble breathing.  Seizures.  Seeing, hearing, feeling, smelling, or tasting things that are not there (hallucinations). These symptoms are known as delirium tremens (DTs). They must be treated at the hospital right away. Follow these instructions at home:   Take over-the-counter and prescription medicines only as told by your doctor. This includes vitamins.  Do not drink alcohol.  Do not drive until your doctor says that this is safe for you.  Have someone stay with you or be available in case you need help. This should be someone you trust. This person can help you with your symptoms. He or she can also help you to not drink.  Drink enough fluid to keep your pee (urine) pale yellow.  Think about joining a support group or a treatment program to help you stop drinking.  Keep all follow-up visits as told by your doctor. This is important. Contact a doctor if:  Your symptoms get worse.  You cannot eat or drink without throwing up.  You have a hard time not drinking alcohol.  You cannot stop drinking alcohol. Get help right away if:   You have fast or uneven heartbeats.  You have chest pain.  You have trouble breathing.  You have a seizure for the first time.  You see, hear, feel, smell, or taste something that is not there.  You get very confused. Summary  When a person who drinks a lot of alcohol stops drinking, he or she may have serious symptoms. This is called alcohol withdrawal syndrome.  Delirium tremens (DTs) is a group of life-threatening symptoms. You should get help right away if you have these symptoms.  Think about joining an alcohol support group or a treatment program. This information is not intended to replace advice given to you by your health care provider. Make sure you discuss any questions you have with your health care provider. Document Released: 10/04/2007 Document Revised: 03/30/2017 Document Reviewed: 12/22/2016 Elsevier Patient Education  2020 [employer]  

## 2019-04-09 NOTE — Progress Notes (Signed)
Physical Therapy Treatment Patient Details Name: Rachel Morris MRN: [id number] DOB: [birth year] Today's Date: 04/09/2019    History of Present Illness Pt is a pleasant 85 yo female presenting from [facility] 12/5 due to c/o chest pressure, N/V, and GI issues. Concern for viral issue, pt COVID neg. PMH sig for THR 2017, HLD, high BP with no dx of HTN, anxiety, and reports of recent increase in alcohol consumption.    PT Comments    Pt OOB in recliner upon PT arrival, tearful due to confusion over d/c plan and her daughter's absence this morning. The pt was eager to participate with therapy. The pt was able to demo improvements in transfers and use of RW today, as well as good dynamic stability with higher-level balance challenges while walking (head turns, changes in gait speed, picking things up from floor) with min guard. The pt was also given HEP hand out and educated extensively on exercises she can continue to assist with general strengthening and endurance upon return to her ILF. The pt was very appreciative and able to demo good technique with all exercises. The pt will continue to benefit from skilled therapy after d/c to maximize safety and independence.     Follow Up Recommendations  Home health PT;Supervision for mobility/OOB     Equipment Recommendations  None recommended by PT    Recommendations for Other Services       Precautions / Restrictions Precautions Precautions: Fall    Mobility  Bed Mobility               General bed mobility comments: pt OOB in recliner  Transfers Overall transfer level: Needs assistance Equipment used: Rolling walker (2 wheeled) Transfers: Sit to/from Stand Sit to Stand: Modified independent (Device/Increase time)         General transfer comment: Pt able to trasnfer in bathroom using hand rails and RW with mod I, no LOB, VCs intermittantly to keep RW close.  Ambulation/Gait Ambulation/Gait assistance: Min guard Gait Distance  (Feet): 500 Feet Assistive device: Rolling walker (2 wheeled) Gait Pattern/deviations: Step-through pattern;WFL(Within Functional Limits) Gait velocity: 0.5 m/s Gait velocity interpretation: <1.8 ft/sec, indicate of risk for recurrent falls General Gait Details: pt demos improved gait speed and pattern, less lateral drift today   Stairs             Wheelchair Mobility    Modified Rankin (Stroke Patients Only)       Balance Overall balance assessment: Needs assistance Sitting-balance support: Feet supported;No upper extremity supported Sitting balance-Leahy Scale: Good Sitting balance - Comments: superivion for safety   Standing balance support: Bilateral upper extremity supported;During functional activity Standing balance-Leahy Scale: Fair Standing balance comment: pt able to usse single UE support to navigate to sink to wash hands while standing             High level balance activites: Direction changes;Head turns;Other (comment)(changes in speed) High Level Balance Comments: Pt able to demo without LOB, sig increased lateral movement/drifting with head turns.            Cognition Arousal/Alertness: Awake/alert Behavior During Therapy: WFL for tasks assessed/performed Overall Cognitive Status: Within Functional Limits for tasks assessed Area of Impairment: Problem solving;Safety/judgement;Memory                     Memory: Decreased short-term memory   Safety/Judgement: Decreased awareness of safety   Problem Solving: Slow processing;Decreased initiation General Comments: Pt with generally functional cog and memory, pt well-oriented  to situation, improved ability to make adjustments for safety. Pt emotional upon PT arrival due to lack of understanding of d./c plan. pt reports "I don't even know who my nurse is."      Exercises General Exercises - Lower Extremity Long Arc Quad: Strengthening;Both;10 reps;Seated Hip ABduction/ADduction:  Strengthening;Standing;10 reps;Both Hip Flexion/Marching: Strengthening;Both;10 reps;Standing Heel Raises: Strengthening;Both;10 reps;Standing Other Exercises Other Exercises: Standing knee flexion 1 x10 each leg Other Exercises: Standing hip extension 1 x 10 each leg    General Comments        Pertinent Vitals/Pain Pain Assessment: No/denies pain    Home Living                      Prior Function            PT Goals (current goals can now be found in the care plan section) Acute Rehab PT Goals Patient Stated Goal: to return home and get stronger PT Goal Formulation: With patient Time For Goal Achievement: 04/22/19 Potential to Achieve Goals: Good Progress towards PT goals: Progressing toward goals    Frequency    Min 2X/week      PT Plan Current plan remains appropriate    Co-evaluation              AM-PAC PT "6 Clicks" Mobility   Outcome Measure  Help needed turning from your back to your side while in a flat bed without using bedrails?: A Little Help needed moving from lying on your back to sitting on the side of a flat bed without using bedrails?: A Little Help needed moving to and from a bed to a chair (including a wheelchair)?: None Help needed standing up from a chair using your arms (e.g., wheelchair or bedside chair)?: None Help needed to walk in hospital room?: A Little Help needed climbing 3-5 steps with a railing? : A Lot 6 Click Score: 19    End of Session Equipment Utilized During Treatment: Gait belt Activity Tolerance: Patient tolerated treatment well Patient left: in chair;with chair alarm set;with call bell/phone within reach Nurse Communication: Mobility status(desire for PT to discuss d/c plan) PT Visit Diagnosis: Unsteadiness on feet (R26.81);Difficulty in walking, not elsewhere classified (R26.2)     Time: [id number] PT Time Calculation (min) (ACUTE ONLY): 38 min  Charges:  $Gait Training: 8-22 mins $Therapeutic  Exercise: 8-22 mins $Self Care/Home Management: 8-22                     Mickey Farber, PT, DPT   Acute Rehabilitation Department [phone]   Otho Bellows 04/09/2019, 12:07 PM

## 2019-04-09 NOTE — Discharge Summary (Addendum)
Physician Discharge Summary  Rachel Morris [id number] DOB: [birth year] DOA: 04/05/2019  PCP: Virgie Dad, MD  Admit date: 04/05/2019 Discharge date: 04/09/2019  Admitted From: Independent living Disposition: Back to independent living with home health care  Recommendations for Outpatient Follow-up:  1. Follow up with PCP in 1-2 weeks 2. Please obtain BMP/CBC in one week 3. Please follow up on the following pending results:  Home Health: Yes Equipment/Devices: None  Discharge Condition: Stable CODE STATUS: Full code Diet recommendation: Regular  Subjective: Seen and examined.  Patient was sitting in her bed and reading the book.  He was completely alert and oriented and had no symptoms.  She is eager to go back to her assisted living facility.  Brief/Interim Summary: Rachel Morris is an 85 y.o. female from [facility] at [facility] with medical history significant oftotal hip replacement, hyperlipidemia, insomnia, essential tremor, elevated blood pressure but no diagnosis of hypertension, alcohol dependence who presented to the ER with chest pressure. Patient reported GI symptoms with nausea vomiting and abdominal pain with some diarrhea about 3 days ago. Also viral-like illness at the time.   Admits to heavy alcohol use over the last several weeks due to being lonely at her independent living since no one is allowed to enter the building due to pandemic.  She was admitted under hospitalist service.  She was hemodynamically stable at the time of admission except mildly elevated systolic blood pressure.  Troponins were checked which were slightly elevated but flat.  EKG did not show any acute ST-T wave changes.  Chest x-ray within normal limits.  She was started on IV Protonix for presumed gastritis secondary to alcohol dependence.  She then started having some alcohol withdrawal for which she was placed on CIWA protocol with as needed Ativan.  I have seen patient for the first time  today.  She is alert and oriented.  She is making complete sense and was reading a book in her bed when I entered the room.  She has no signs of withdrawal.  No tremors.  I spent a great length of time with her counseling regarding cutting down her alcohol and in fact quitting.  She verbalized that " I am ready for that and I do not have any other options since my daughter has made it clear and she is taking all the liquor away from my apartment".  She denied any chest pain, shortness of breath, nausea, vomiting and she has tolerated very well full liquid diet.  She is going to get soft diet now.  I called patient's daughter Rachel Morris per patient's request.  Rachel Morris had several questions which I answered to the best of her satisfaction.  Rachel Morris was mainly concerned about her mother's emotional issues, alcohol dependency and craving to go back and drink.  I did reiterate to her that I have personally counseled the patient regarding eating alcohol and at this point in time, patient seemed to be fairly sincere about her wishes to quit alcohol.  Rachel Morris started crying over the phone.  I provided emotional support.  I recommended that patient should see her PCP and get referral to see psychiatrist and psychologist so she can get some therapies and possibly some help being put on medications.  Rachel Morris verbalized understanding and will discuss this with her PCP.  Rachel Morris was concerned about her possible readmission to the hospital.  I told Rachel Morris that based on my clinical assessment today, my clinical opinion is that she is ready  to be discharged since she is not having any withdrawal symptoms and in fact her CIWA has been less than 2 for last 24 hours and she has not required any Ativan and she does not have any tremors and she is completely alert and oriented however she understood that there is no guarantee that patient is at risk of possible readmission if she starts drinking again.  She was seen by PT OT and they  recommended home health which I have ordered along with Education officer, museum.  In the meantime, I am discharging her on gabapentin 300 mg p.o. daily for off label use for possible help with alcohol dependency/withdrawal.  She has not had any symptoms or nausea vomiting since admission.  According to her, those symptoms she experienced prior to admission.  Obviously due to heavy drinking, she might have had gastritis.  She was placed on Protonix IV twice daily here.  I am discharging her on omeprazole 40 mg p.o. daily for 30 days for the treatment.  She is being discharged in stable condition.  Discharge Diagnoses:  Principal Problem:   Chest pain Active Problems:   Hyperlipidemia with target LDL less than 100   Reflux esophagitis   Anxiety   Alcohol use   Tremor, essential   Elevated LFTs   Gastritis    Discharge Instructions  Discharge Instructions    Discharge patient   Complete by: As directed    Discharge disposition: 06-Home-Health Care Svc   Discharge patient date: 04/09/2019     Allergies as of 04/09/2019      Reactions   Brandy [alcohol] Anaphylaxis   Related to grapes   Prune Anaphylaxis   PLUMS,GRAPES,RAISINS   Aspirin Other (See Comments)   Stomach bleed   Augmentin [amoxicillin-pot Clavulanate] Hives, Itching   Clindamycin Other (See Comments)   unkown   Omnicef [cefdinir] Hives   Vinegar [acetic Acid]    basalmic   Alprazolam Rash   Ceftin [cefuroxime Axetil] Rash   Doxycycline Rash   Eryc [erythromycin] Rash   Penicillins Rash      Medication List    TAKE these medications   acetaminophen 325 MG tablet Commonly known as: TYLENOL Take 650 mg by mouth every 6 (six) hours as needed for mild pain.   cholecalciferol 1000 units tablet Commonly known as: VITAMIN D Take 1,000 Units by mouth daily.   gabapentin 300 MG capsule Commonly known as: Neurontin Take 1 capsule (300 mg total) by mouth at bedtime.   GARLIC PO Take 1 tablet by mouth daily.    latanoprost 0.005 % ophthalmic solution Commonly known as: XALATAN Place 1 drop into both eyes at bedtime.      Follow-up Information    Virgie Dad, MD Follow up in 1 week(s).   Specialty: Internal Medicine Contact information: [place] [phone] [phone]          Allergies  Allergen Reactions  . Brandy [Alcohol] Anaphylaxis    Related to grapes  . Prune Anaphylaxis    PLUMS,GRAPES,RAISINS  . Aspirin Other (See Comments)    Stomach bleed  . Augmentin [Amoxicillin-Pot Clavulanate] Hives and Itching  . Clindamycin Other (See Comments)    unkown  . Omnicef [Cefdinir] Hives  . Vinegar [Acetic Acid]     basalmic  . Alprazolam Rash  . Ceftin [Cefuroxime Axetil] Rash  . Doxycycline Rash  . Eryc [Erythromycin] Rash  . Penicillins Rash    Consultations: None   Procedures/Studies: Dg Chest Portable 1  View  Result Date: 04/05/2019 CLINICAL DATA:  Shortness of breath. EXAM: PORTABLE CHEST 1 VIEW COMPARISON:  June 15, 2017 FINDINGS: The heart size and mediastinal contours are within normal limits. Both lungs are clear. The visualized skeletal structures are unremarkable. IMPRESSION: No active disease. Electronically Signed   By: Dorise Bullion III M.D   On: 04/05/2019 20:00   US Abdomen Limited Ruq  Result Date: 04/06/2019 CLINICAL DATA:  Chest pressure EXAM: ULTRASOUND ABDOMEN LIMITED RIGHT UPPER QUADRANT COMPARISON:  None. FINDINGS: Gallbladder: No gallstones or wall thickening visualized. No sonographic Murphy sign noted by sonographer. Common bile duct: Diameter: 5-6 mm Liver: Moderately heterogeneous echotexture with suggestion of nodular hepatic contour and increased echogenicity. No focal lesion though limited by baseline increased echogenicity. Portal vein is patent on color Doppler imaging with normal direction of blood flow towards the liver. Other: Trace pleural fluid noted in the right chest. IMPRESSION: 1. Findings that suggest  steatosis with possible cirrhosis. Correlation with clinical and laboratory evidence of liver disease is suggested. 2. Common bile duct at upper limits of normal given gallbladder remains in place. Correlate with any clinical concern for biliary colic. This may represent what is normal for this patient at this age however. 3. Trace right pleural effusion. Electronically Signed   By: Zetta Bills M.D.   On: 04/06/2019 12:52      Discharge Exam: Vitals:   04/09/19 0443 04/09/19 0810  BP: (!) 160/84 (!) 152/101  Pulse: 88 92  Resp:  19  Temp: 98.1 F (36.7 C) 98.6 F (37 C)  SpO2: 98% 98%   Vitals:   04/08/19 1950 04/09/19 0001 04/09/19 0443 04/09/19 0810  BP: (!) 164/70 (!) 163/78 (!) 160/84 (!) 152/101  Pulse: 90 89 88 92  Resp:    19  Temp: 99.1 F (37.3 C) 98.7 F (37.1 C) 98.1 F (36.7 C) 98.6 F (37 C)  TempSrc: Oral Oral Oral Oral  SpO2: 97% 94% 98% 98%  Weight:   61.6 kg   Height:        General: Pt is alert, awake, not in acute distress Cardiovascular: RRR, S1/S2 +, no rubs, no gallops Respiratory: CTA bilaterally, no wheezing, no rhonchi Abdominal: Soft, NT, ND, bowel sounds + Extremities: no edema, no cyanosis    The results of significant diagnostics from this hospitalization (including imaging, microbiology, ancillary and laboratory) are listed below for reference.     Microbiology: Recent Results (from the past 240 hour(s))  SARS CORONAVIRUS 2 (TAT 6-24 HRS) Nasopharyngeal Nasopharyngeal Swab     Status: None   Collection Time: 04/05/19 11:23 PM   Specimen: Nasopharyngeal Swab  Result Value Ref Range Status   SARS Coronavirus 2 NEGATIVE NEGATIVE Final    Comment: (NOTE) SARS-CoV-2 target nucleic acids are NOT DETECTED. The SARS-CoV-2 RNA is generally detectable in upper and lower respiratory specimens during the acute phase of infection. Negative results do not preclude SARS-CoV-2 infection, do not rule out co-infections with other pathogens, and  should not be used as the sole basis for treatment or other patient management decisions. Negative results must be combined with clinical observations, patient history, and epidemiological information. The expected result is Negative. Fact Sheet for Patients: SugarRoll.be Fact Sheet for Healthcare Providers: https://www.woods-mathews.com/ This test is not yet approved or cleared by the [place] FDA and  has been authorized for detection and/or diagnosis of SARS-CoV-2 by FDA under an Emergency Use Authorization (EUA). This EUA will remain  in effect (meaning this test can be  used) for the duration of the COVID-19 declaration under Section 56 4(b)(1) of the Act, 21 U.S.C. section 360bbb-3(b)(1), unless the authorization is terminated or revoked sooner. Performed at [facility], [place] [address] [place], [place] [address]      Labs: BNP (last 3 results) No results for input(s): BNP in the last 8760 hours. Basic Metabolic Panel: Recent Labs  Lab 04/05/19 1758 04/06/19 0147 04/07/19 0455 04/08/19 0327 04/09/19 0817  NA 133*  --  138 135 134*  K 4.9  --  3.5 3.4* 3.8  CL 101  --  105 106 102  CO2 21*  --  23 23 23   GLUCOSE 85  --  101* 100* 102*  BUN 13  --  8 7* 6*  CREATININE 0.68 0.76 0.75 0.71 0.70  CALCIUM 10.0  --  9.4 9.0 9.7  MG  --   --   --  2.1 2.0   Liver Function Tests: Recent Labs  Lab 04/05/19 1758 04/07/19 0455 04/08/19 0327 04/09/19 0817  AST 82* 33 24 24  ALT 74* 38 31 32  ALKPHOS 83 71 60 70  BILITOT 0.8 0.9 0.6 0.8  PROT 6.8 5.7* 5.2* 6.1*  ALBUMIN 4.0 3.0* 2.9* 3.0*   Recent Labs  Lab 04/06/19 1429  LIPASE 35   No results for input(s): AMMONIA in the last 168 hours. CBC: Recent Labs  Lab 04/05/19 1758 04/06/19 0147 04/07/19 0455 04/08/19 0327 04/09/19 0817  WBC 9.3 9.0 8.0 8.3 8.4  NEUTROABS 5.2  --   --   --  5.3  HGB 13.9 12.3 12.1 11.9* 12.6  HCT 41.9 36.6 36.6 35.1* 37.9   MCV 103.5* 100.5* 101.9* 101.4* 102.7*  PLT 217 192 188 175 199   Cardiac Enzymes: No results for input(s): CKTOTAL, CKMB, CKMBINDEX, TROPONINI in the last 168 hours. BNP: Invalid input(s): POCBNP CBG: Recent Labs  Lab 04/05/19 1729  GLUCAP 86   D-Dimer Recent Labs    04/06/19 1429  DDIMER 0.56*   Hgb A1c No results for input(s): HGBA1C in the last 72 hours. Lipid Profile No results for input(s): CHOL, HDL, LDLCALC, TRIG, CHOLHDL, LDLDIRECT in the last 72 hours. Thyroid function studies No results for input(s): TSH, T4TOTAL, T3FREE, THYROIDAB in the last 72 hours.  Invalid input(s): FREET3 Anemia work up No results for input(s): VITAMINB12, FOLATE, FERRITIN, TIBC, IRON, RETICCTPCT in the last 72 hours. Urinalysis    Component Value Date/Time   COLORURINE YELLOW 09/25/2017 1657   APPEARANCEUR CLEAR 09/25/2017 1657   LABSPEC 1.015 09/25/2017 1657   PHURINE 5.5 09/25/2017 1657   GLUCOSEU NEGATIVE 09/25/2017 1657   HGBUR 1+ (A) 09/25/2017 1657   BILIRUBINUR NEGATIVE 09/22/2016 1240   BILIRUBINUR neg 04/17/2016 1051   KETONESUR TRACE (A) 09/25/2017 1657   PROTEINUR NEGATIVE 09/25/2017 1657   UROBILINOGEN negative 04/17/2016 1051   UROBILINOGEN 0.2 08/11/2014 1539   NITRITE NEGATIVE 09/22/2016 1240   LEUKOCYTESUR 2+ (A) 09/22/2016 1240   Sepsis Labs Invalid input(s): PROCALCITONIN,  WBC,  LACTICIDVEN Microbiology Recent Results (from the past 240 hour(s))  SARS CORONAVIRUS 2 (TAT 6-24 HRS) Nasopharyngeal Nasopharyngeal Swab     Status: None   Collection Time: 04/05/19 11:23 PM   Specimen: Nasopharyngeal Swab  Result Value Ref Range Status   SARS Coronavirus 2 NEGATIVE NEGATIVE Final    Comment: (NOTE) SARS-CoV-2 target nucleic acids are NOT DETECTED. The SARS-CoV-2 RNA is generally detectable in upper and lower respiratory specimens during the acute phase of infection. Negative results  do not preclude SARS-CoV-2 infection, do not rule out co-infections with  other pathogens, and should not be used as the sole basis for treatment or other patient management decisions. Negative results must be combined with clinical observations, patient history, and epidemiological information. The expected result is Negative. Fact Sheet for Patients: SugarRoll.be Fact Sheet for Healthcare Providers: https://www.woods-mathews.com/ This test is not yet approved or cleared by the [place] FDA and  has been authorized for detection and/or diagnosis of SARS-CoV-2 by FDA under an Emergency Use Authorization (EUA). This EUA will remain  in effect (meaning this test can be used) for the duration of the COVID-19 declaration under Section 56 4(b)(1) of the Act, 21 U.S.C. section 360bbb-3(b)(1), unless the authorization is terminated or revoked sooner. Performed at [facility], [place] [address], [place], [place] [address]      Time coordinating discharge: Over 30 minutes  SIGNED:   Darliss Cheney, MD  [facility] 04/09/2019, 9:39 AM  If 7PM-7AM, please contact night-coverage [email or web address] Password [email or web address]

## 2019-04-09 NOTE — TOC Initial Note (Signed)
Transition of Care [facility]) - Initial/Assessment Note    Patient Details  Name: Rachel Morris MRN: [id number] Date of Birth: 1930/08/04  Transition of Care [facility]) CM/SW Contact:    Eileen Stanford, LCSW Phone Number: 04/09/2019, 2:33 PM  Clinical Narrative:   Pt came from [facility], [place]. Pt needs HH PT, OT, RN, SW. Pt's daughter prefers [facility]. HH has been set up. Pt is cleared to d/c back to ILF.                Expected Discharge Plan: [place] Barriers to Discharge: No Barriers Identified   Patient Goals and CMS Choice        Expected Discharge Plan and Services Expected Discharge Plan: [place] In-house Referral: Clinical Social Work   Post Acute Care Choice: [place] arrangements for the past 2 months: [place] Expected Discharge Date: 04/09/19                         HH Arranged: PT, OT, RN, Social Work [employer] Agency: [place] ([place]) Date [place]: 04/09/19 Time [place]: [place] Representative spoke with at [place]: [place] Arrangements/Services Living arrangements for the past 2 months: [place] Lives with:: Self Patient language and need for interpreter reviewed:: Yes Do you feel safe going back to the place where you live?: Yes      Need for Family Participation in Patient Care: Yes (Comment) Care giver support system in place?: Yes (comment)   Criminal Activity/Legal Involvement Pertinent to Current Situation/Hospitalization: No - Comment as needed  Activities of Daily Living Home Assistive Devices/Equipment: Environmental consultant (specify type) ADL Screening (condition at time of admission) Patient's cognitive ability adequate to safely complete daily activities?: Yes Is the patient deaf or have difficulty hearing?: No Does the patient have difficulty seeing, even when wearing glasses/contacts?: No Does the patient have  difficulty concentrating, remembering, or making decisions?: No Patient able to express need for assistance with ADLs?: No Does the patient have difficulty dressing or bathing?: No Independently performs ADLs?: Yes (appropriate for developmental age) Does the patient have difficulty walking or climbing stairs?: No Weakness of Legs: None Weakness of Arms/Hands: None  Permission Sought/Granted Permission sought to share information with : Family Supports    Share Information with NAME: [name]  Permission granted to share info w AGENCY: [facility]  Permission granted to share info w Relationship: Daughter     Emotional Assessment Appearance:: Appears stated age Attitude/Demeanor/Rapport: Engaged Affect (typically observed): Accepting, Appropriate Orientation: : Oriented to Self, Oriented to Place, Oriented to  Time, Oriented to Situation Alcohol / Substance Use: Not Applicable Psych Involvement: No (comment)  Admission diagnosis:  Elevated troponin [R77.8] Elevated LFTs [R79.89] Chest pain [R07.9] Patient Active Problem List   Diagnosis Date Noted  . Gastritis 04/09/2019  . Elevated LFTs   . Chest pain 04/05/2019  . Tremor, essential 11/06/2018  . Pure hypercholesterolemia 04/22/2018  . Slow transit constipation 04/22/2018  . History of right hip replacement 02/25/2018  . Trochanteric bursitis, right hip 05/29/2017  . Anxiety 04/16/2017  . Alcohol use 04/16/2017  . Primary insomnia 04/16/2017  . Presence of pessary 10/16/2016  . Status post total replacement of right hip 12/10/2015  . Primary osteoarthritis of right hip 10/15/2015  . Senile osteoporosis 09/30/2012  . Reflux esophagitis 09/30/2012  . Hyperlipidemia with target LDL less than 100   .  Allergic rhinitis due to pollen   . Cystocele with small rectocele and uterine descent 09/09/2012  . Vaginal atrophy 09/09/2012   PCP:  Virgie Dad, MD Pharmacy:   [place], [place] [address] Phone: [phone] Fax: [place] [address] [place] [address] [place] [address] Phone: [phone] Fax: [phone]     Social Determinants of Health ([place]) Interventions    Readmission Risk Interventions No flowsheet data found.

## 2019-04-14 ENCOUNTER — Other Ambulatory Visit: Payer: Self-pay | Admitting: *Deleted

## 2019-04-14 DIAGNOSIS — M25511 Pain in right shoulder: Secondary | ICD-10-CM | POA: Diagnosis not present

## 2019-04-14 DIAGNOSIS — M545 Low back pain: Secondary | ICD-10-CM | POA: Diagnosis not present

## 2019-04-14 DIAGNOSIS — R41841 Cognitive communication deficit: Secondary | ICD-10-CM | POA: Diagnosis not present

## 2019-04-14 NOTE — Patient Outreach (Signed)
[facility]) Care Management  04/14/2019  FAVIOLA APPLEMAN 15-Nov-1930 [id number]    EMMI-GENERAL DISCHARGE-RESOLVED RED ON EMMI ALERT Day #1 Date:04/11/2019 Red Alert Reason:Don't have paperwork, Know who to call bout changes in condition  OUTREACH #1 RN spoke with pt who reports entering the hospital for Co-vid screen but had to stay for gastritis. Pt states her daughter was present at the time of her arrival. Pt lives at a ALF [facility]) and has a pending appointment on this Wednesday. States the provider's is nearby if she needs anything. All issues resolved with no further needs.  PLAN:  Will close with no additional needs at this time.  Raina Mina, RN Care Management Coordinator [place] Office [phone]

## 2019-04-16 ENCOUNTER — Other Ambulatory Visit: Payer: Self-pay

## 2019-04-16 ENCOUNTER — Encounter: Payer: Self-pay | Admitting: Internal Medicine

## 2019-04-16 ENCOUNTER — Non-Acute Institutional Stay: Payer: Medicare Other | Admitting: Internal Medicine

## 2019-04-16 VITALS — BP 128/76 | HR 82 | Temp 98.3°F | Ht 62.0 in | Wt 140.0 lb

## 2019-04-16 DIAGNOSIS — K5901 Slow transit constipation: Secondary | ICD-10-CM | POA: Diagnosis not present

## 2019-04-16 DIAGNOSIS — F1010 Alcohol abuse, uncomplicated: Secondary | ICD-10-CM | POA: Diagnosis not present

## 2019-04-16 DIAGNOSIS — K2920 Alcoholic gastritis without bleeding: Secondary | ICD-10-CM

## 2019-04-16 DIAGNOSIS — M25511 Pain in right shoulder: Secondary | ICD-10-CM | POA: Diagnosis not present

## 2019-04-16 DIAGNOSIS — E785 Hyperlipidemia, unspecified: Secondary | ICD-10-CM

## 2019-04-16 DIAGNOSIS — M545 Low back pain: Secondary | ICD-10-CM | POA: Diagnosis not present

## 2019-04-16 DIAGNOSIS — G250 Essential tremor: Secondary | ICD-10-CM

## 2019-04-16 DIAGNOSIS — R41841 Cognitive communication deficit: Secondary | ICD-10-CM | POA: Diagnosis not present

## 2019-04-16 MED ORDER — EPINEPHRINE 0.3 MG/0.3ML IJ SOAJ: 0.3000 mg | Freq: Once | INTRAMUSCULAR | Status: DC

## 2019-04-16 MED ORDER — EPINEPHRINE 0.3 MG/0.3ML IJ SOAJ: 0.3000 mg | INTRAMUSCULAR | 1 refills | Status: DC | PRN

## 2019-04-16 MED ORDER — GABAPENTIN 300 MG PO CAPS: 300.0000 mg | ORAL_CAPSULE | Freq: Every day | ORAL | 1 refills | Status: DC

## 2019-04-16 NOTE — Progress Notes (Signed)
Location: [place] of Service:  Clinic (12)  Provider:   Code Status:  Goals of Care:  Advanced Directives 04/06/2019  Does Patient Have a Medical Advance Directive? Yes  Type of Paramedic of [place];Living will  Does patient want to make changes to medical advance directive? No - Patient declined  Copy of [place] in Chart? No - copy requested  Would patient like information on creating a medical advance directive? -  Pre-existing out of facility DNR order (yellow form or pink MOST form) -     Chief Complaint  Patient presents with  . Medical Management of [facility] follow up with daughter Coolidge Breeze. Patient went in on the 5th and was released on the 9th.    HPI: Patient is a 85 y.o. female seen today for an acute visit for Hospital Follow up Patient was admitted to the Hospital from 12/05-12/09 for GI symptoms due to Excessive Alcohol use and she did go into Alcohol withdrawal in the Hospital  Patient has history of hyper lipidemia, arthritis status post right hip arthroplasty, essential tremors, anxiety and constipation Patient lives in [place].  Is independent in her ADLs and IADLs.  Has a daughter who is in town and is Glass blower/designer.  Due to Covid restriction patient went in to [place] and started drinking more alcohol. Patient does have a history of alcohol use limited at night to help her sleep. Her daughter told her to go to the emergency room when she started having nausea vomiting abdominal pain diarrhea.  She was tested for Covid twice which was negative.  Was admitted.  Her troponin was mildly high also but EKG did not show any changes. She went into alcohol withdrawal in the hospital.  And was treated accordingly  Since her discharge her daughter threw away all her alcohol bottles in the room.  Patient does not have any access to alcohol anymore.  She states she feels much better  her tremor Is gone she is sleeping better on Neurontin.  Not having any abdominal pain.   Past Medical History:  Diagnosis Date  . Allergic rhinitis due to pollen   . Anxiety state, unspecified   . Arthritis    RIGHT LEG, HANDS.  [name] Cancer (HCC)    FACE/NOSE -SKIN-squamous cell and basal cell.  . Disorder of bone and cartilage, unspecified   . Lumbago   . Lyme borreliosis   . Other atopic dermatitis and related conditions   . Other dysphagia   . Reflux esophagitis   . Senile osteoporosis   . TMJ dysfunction    MORE ON LEFT "WEARS BITE PROTECTION AT NIGHT"    Past Surgical History:  Procedure Laterality Date  . APPENDECTOMY  1950  . CATARACT EXTRACTION, BILATERAL Bilateral   . EYE SURGERY Right    right eye ptrigium excision x2  . HERNIA REPAIR Bilateral   . TOTAL HIP ARTHROPLASTY Right 12/10/2015   Procedure: RIGHT TOTAL HIP ARTHROPLASTY ANTERIOR APPROACH;  Surgeon: [name], MD;  Location: [facility];  Service: Orthopedics;  Laterality: Right;  Spinal to General    Allergies  Allergen Reactions  . Brandy [Alcohol] Anaphylaxis    Related to grapes  . Prune Anaphylaxis    PLUMS,GRAPES,RAISINS  . Aspirin Other (See Comments)    Stomach bleed  . Augmentin [Amoxicillin-Pot Clavulanate] Hives and Itching  . Clindamycin Other (See Comments)    unkown  .  Omnicef [Cefdinir] Hives  . Vinegar [Acetic Acid]     basalmic  . Alprazolam Rash  . Ceftin [Cefuroxime Axetil] Rash  . Doxycycline Rash  . Eryc [Erythromycin] Rash  . Penicillins Rash    Outpatient Encounter Medications as of 04/16/2019  Medication Sig  . acetaminophen (TYLENOL) 325 MG tablet Take 650 mg by mouth every 6 (six) hours as needed for mild pain.  . cholecalciferol (VITAMIN D) 1000 UNITS tablet Take 1,000 Units by mouth daily.  [name] gabapentin (NEURONTIN) 300 MG capsule Take 1 capsule (300 mg total) by mouth at bedtime.  [name] GARLIC PO Take 1 tablet by mouth daily.  [name] latanoprost (XALATAN) 0.005 %  ophthalmic solution Place 1 drop into both eyes at bedtime.  [name] omeprazole (PRILOSEC) 40 MG capsule Take 1 capsule (40 mg total) by mouth daily.  [name] senna (SENOKOT) 8.6 MG TABS tablet Take 1 tablet by mouth daily as needed for mild constipation.   No facility-administered encounter medications on file as of 04/16/2019.    Review of Systems:  Review of Systems  Review of Systems  Constitutional: Negative for activity change, appetite change, chills, diaphoresis, fatigue and fever.  HENT: Negative for mouth sores, postnasal drip, rhinorrhea, sinus pain and sore throat.   Respiratory: Negative for apnea, cough, chest tightness, shortness of breath and wheezing.   Cardiovascular: Negative for chest pain, palpitations and leg swelling.  Gastrointestinal: Negative for abdominal distention, abdominal pain, constipation, diarrhea, nausea and vomiting.  Genitourinary: Negative for dysuria and frequency.  Musculoskeletal: Negative for arthralgias, joint swelling and myalgias.  Skin: Negative for rash.  Neurological: Negative for dizziness, syncope, weakness, light-headedness and numbness.  Psychiatric/Behavioral: Negative for behavioral problems, confusion and sleep disturbance.     Health Maintenance  Topic Date Due  . MAMMOGRAM  03/30/2018  . TETANUS/TDAP  05/03/2025  . INFLUENZA VACCINE  Completed  . DEXA SCAN  Completed  . PNA vac Low Risk Adult  Completed    Physical Exam: Vitals:   04/16/19 1540  BP: 128/76  Pulse: 82  Temp: 98.3 F (36.8 C)  SpO2: 99%  Weight: 140 lb (63.5 kg)  Height: 5\' 2"  (1.575 m)   Body mass index is 25.61 kg/m. Physical Exam  Constitutional: Oriented to person, place, and time. Well-developed and well-nourished.  HENT:  Head: Normocephalic.  Mouth/Throat: Oropharynx is clear and moist.  Eyes: Pupils are equal, round, and reactive to light.  Neck: Neck supple.  Cardiovascular: Normal rate and normal heart sounds.  No murmur  heard. Pulmonary/Chest: Effort normal and breath sounds normal. No respiratory distress. No wheezes. She has no rales.  Abdominal: Soft. Bowel sounds are normal. No distension. There is no tenderness. There is no rebound.  Musculoskeletal: No edema.  Lymphadenopathy: none Neurological: Alert and oriented to person, place, and time. Gait was good with No assist Skin: Skin is warm and dry.  Psychiatric: Normal mood and affect. Behavior is normal. Thought content normal.    Labs reviewed: Basic Metabolic Panel: Recent Labs    04/07/19 0455 04/08/19 0327 04/09/19 0817  NA 138 135 134*  K 3.5 3.4* 3.8  CL 105 106 102  CO2 23 23 23   GLUCOSE 101* 100* 102*  BUN 8 7* 6*  CREATININE 0.75 0.71 0.70  CALCIUM 9.4 9.0 9.7  MG  --  2.1 2.0   Liver Function Tests: Recent Labs    04/07/19 0455 04/08/19 0327 04/09/19 0817  AST 33 24 24  ALT 38 31 32  ALKPHOS 71 60  70  BILITOT 0.9 0.6 0.8  PROT 5.7* 5.2* 6.1*  ALBUMIN 3.0* 2.9* 3.0*   Recent Labs    04/06/19 1429  LIPASE 35   No results for input(s): AMMONIA in the last 8760 hours. CBC: Recent Labs    10/31/18 1436 04/05/19 1758 04/07/19 0455 04/08/19 0327 04/09/19 0817  WBC 7.2 9.3 8.0 8.3 8.4  NEUTROABS 3,434 5.2  --   --  5.3  HGB 13.7 13.9 12.1 11.9* 12.6  HCT 40.4 41.9 36.6 35.1* 37.9  MCV 97.8 103.5* 101.9* 101.4* 102.7*  PLT 267 217 188 175 199   Lipid Panel: Recent Labs    04/06/19 0147  CHOL 240*  HDL 95  LDLCALC 137*  TRIG 39  CHOLHDL 2.5   No results found for: HGBA1C  Procedures since last visit: DG Chest Portable 1 View  Result Date: 04/05/2019 CLINICAL DATA:  Shortness of breath. EXAM: PORTABLE CHEST 1 VIEW COMPARISON:  June 15, 2017 FINDINGS: The heart size and mediastinal contours are within normal limits. Both lungs are clear. The visualized skeletal structures are unremarkable. IMPRESSION: No active disease. Electronically Signed   By: Dorise Bullion III M.D   On: 04/05/2019 20:00    ECHOCARDIOGRAM COMPLETE  Result Date: 04/07/2019   ECHOCARDIOGRAM REPORT   Patient Name:   DENICIA MANZER Date of Exam: 04/07/2019 Medical Rec #:  [id number]         Height:       62.0 in Accession #:    [id number]        Weight:       136.9 lb Date of Birth:  21-Jul-1930         BSA:          1.63 m Patient Age:    21 years          BP:           152/71 mmHg Patient Gender: F                 HR:           87 bpm. Exam Location:  Inpatient Procedure: 2D Echo Indications:    Chest pain 786.50/R07.9  History:        Patient has no prior history of Echocardiogram examinations.                 Risk Factors:Dyslipidemia.  Sonographer:    Clayton Lefort RDCS (AE) Referring Phys: [place]  1. Left ventricular ejection fraction, by visual estimation, is 60 to 65%. The left ventricle has normal function. There is no left ventricular hypertrophy.  2. Left ventricular diastolic parameters are indeterminate.  3. The left ventricle has no regional wall motion abnormalities.  4. Global right ventricle has normal systolic function.The right ventricular size is normal. No increase in right ventricular wall thickness.  5. Left atrial size was normal.  6. Right atrial size was normal.  7. The mitral valve is grossly normal. Trace mitral valve regurgitation.  8. The tricuspid valve is normal in structure. Tricuspid valve regurgitation is trivial.  9. The aortic valve is tricuspid. Aortic valve regurgitation is not visualized. Mild aortic valve sclerosis without stenosis. 10. The pulmonic valve was not well visualized. Pulmonic valve regurgitation is not visualized. 11. The inferior vena cava is normal in size with greater than 50% respiratory variability, suggesting right atrial pressure of 3 mmHg. FINDINGS  Left Ventricle: Left ventricular ejection fraction, by visual estimation, is 60 to  65%. The left ventricle has normal function. The left ventricle has no regional wall motion abnormalities. There is no  left ventricular hypertrophy. Left ventricular diastolic parameters are indeterminate. Right Ventricle: The right ventricular size is normal. No increase in right ventricular wall thickness. Global RV systolic function is has normal systolic function. Left Atrium: Left atrial size was normal in size. Right Atrium: Right atrial size was normal in size Pericardium: There is no evidence of pericardial effusion. Mitral Valve: The mitral valve is grossly normal. MV peak gradient, 6.2 mmHg. Trace mitral valve regurgitation. Tricuspid Valve: The tricuspid valve is normal in structure. Tricuspid valve regurgitation is trivial. Aortic Valve: The aortic valve is tricuspid. . There is mild thickening and mild calcification of the aortic valve. Aortic valve regurgitation is not visualized. Mild aortic valve sclerosis is present, with no evidence of aortic valve stenosis. There is mild thickening of the aortic valve. There is mild calcification of the aortic valve. Aortic valve mean gradient measures 3.0 mmHg. Aortic valve peak gradient measures 6.2 mmHg. Aortic valve area, by VTI measures 2.22 cm. Pulmonic Valve: The pulmonic valve was not well visualized. Pulmonic valve regurgitation is not visualized. Aorta: The aortic root, ascending aorta and aortic arch are all structurally normal, with no evidence of dilitation or obstruction. Pulmonary Artery: The pulmonary artery is not well seen. Venous: The inferior vena cava is normal in size with greater than 50% respiratory variability, suggesting right atrial pressure of 3 mmHg. IAS/Shunts: No atrial level shunt detected by color flow Doppler.  LEFT VENTRICLE PLAX 2D LVIDd:         4.30 cm  Diastology LVIDs:         2.80 cm  LV e' lateral:   8.59 cm/s LV PW:         1.20 cm  LV E/e' lateral: 10.4 LV IVS:        1.20 cm  LV e' medial:    5.77 cm/s LVOT diam:     1.90 cm  LV E/e' medial:  15.5 LV SV:         54 ml LV SV Index:   32.22 LVOT Area:     2.84 cm  RIGHT VENTRICLE             IVC RV Basal diam:  2.20 cm    IVC diam: 1.60 cm RV S prime:     7.83 cm/s TAPSE (M-mode): 1.3 cm LEFT ATRIUM             Index       RIGHT ATRIUM          Index LA diam:        3.20 cm 1.97 cm/m  RA Area:     9.20 cm LA Vol (A2C):   44.6 ml 27.41 ml/m RA Volume:   17.50 ml 10.75 ml/m LA Vol (A4C):   31.5 ml 19.36 ml/m LA Biplane Vol: 39.0 ml 23.97 ml/m  AORTIC VALVE AV Area (Vmax):    1.99 cm AV Area (Vmean):   1.98 cm AV Area (VTI):     2.22 cm AV Vmax:           125.00 cm/s AV Vmean:          84.900 cm/s AV VTI:            0.250 m AV Peak Grad:      6.2 mmHg AV Mean Grad:      3.0 mmHg LVOT Vmax:  87.70 cm/s LVOT Vmean:        59.200 cm/s LVOT VTI:          0.196 m LVOT/AV VTI ratio: 0.78  AORTA Ao Root diam: 2.70 cm MITRAL VALVE MV Area (PHT): 3.99 cm              SHUNTS MV Peak grad:  6.2 mmHg              Systemic VTI:  0.20 m MV Mean grad:  2.0 mmHg              Systemic Diam: 1.90 cm MV Vmax:       1.24 m/s MV Vmean:      70.3 cm/s MV VTI:        0.25 m MV PHT:        55.10 msec MV Decel Time: 190 msec MV E velocity: 89.70 cm/s  103 cm/s MV A velocity: 107.00 cm/s 70.3 cm/s MV E/A ratio:  0.84        1.5  Buford Dresser MD Electronically signed by Buford Dresser MD Signature Date/Time: 04/07/2019/5:34:32 PM    Final    US Abdomen Limited RUQ  Result Date: 04/06/2019 CLINICAL DATA:  Chest pressure EXAM: ULTRASOUND ABDOMEN LIMITED RIGHT UPPER QUADRANT COMPARISON:  None. FINDINGS: Gallbladder: No gallstones or wall thickening visualized. No sonographic Murphy sign noted by sonographer. Common bile duct: Diameter: 5-6 mm Liver: Moderately heterogeneous echotexture with suggestion of nodular hepatic contour and increased echogenicity. No focal lesion though limited by baseline increased echogenicity. Portal vein is patent on color Doppler imaging with normal direction of blood flow towards the liver. Other: Trace pleural fluid noted in the right chest. IMPRESSION: 1. Findings  that suggest steatosis with possible cirrhosis. Correlation with clinical and laboratory evidence of liver disease is suggested. 2. Common bile duct at upper limits of normal given gallbladder remains in place. Correlate with any clinical concern for biliary colic. This may represent what is normal for this patient at this age however. 3. Trace right pleural effusion. Electronically Signed   By: Zetta Bills M.D.   On: 04/06/2019 12:52    Assessment/Plan Alcohol abuse D/W the daughter Patient is doing really good. she has quit alcohol completely.   She states that her mood is better and she plans to stay off it for now We did discuss about starting antidepressant we will wait for another few months for the next visit and discuss this again.  Patient states her mood is much better and she does not feel any depression We will continue Neurontin for now  Acute alcoholic gastritis without hemorrhage She will finish her Prilosec as prescribed at the hospital Can then take Pepcid as needed  Slow transit constipation Patient can take Senokot and Colace Tremor, essential Its improved since she quit alcohol Hyperlipidemia with target LDL less than 100 Repeat fasting lipid Covid vaccine Patient's daughter wanted to know if patient will qualify for Covid Vaccine as she is allergic to a lot of medications We discussed about different options available to her next month. I also renewed her EpiPen Insomnia Can take melatonin up to 3 mg nightly Cognitive impairment Patient does repeat herself a lot and has been following with speech therapy here in facility.  Would do MMSE next with  Labs/tests ordered:  * No order type specified * Next appt:  05/08/2019  Total time spent in this patient care encounter was  45_  minutes; greater than 50% of the  visit spent counseling patient and staff, reviewing records , Labs and coordinating care for problems addressed at this encounter.

## 2019-04-21 DIAGNOSIS — M545 Low back pain: Secondary | ICD-10-CM | POA: Diagnosis not present

## 2019-04-21 DIAGNOSIS — R41841 Cognitive communication deficit: Secondary | ICD-10-CM | POA: Diagnosis not present

## 2019-04-21 DIAGNOSIS — M25511 Pain in right shoulder: Secondary | ICD-10-CM | POA: Diagnosis not present

## 2019-04-22 ENCOUNTER — Telehealth: Payer: Self-pay | Admitting: *Deleted

## 2019-04-22 NOTE — Telephone Encounter (Signed)
They did mention that in their Discharge note. But there is no way for me to know if order was placed or not.I can place the order but I am not sure they are letting Outside agency come to the Residents apartments due to Covid restriction.

## 2019-04-22 NOTE — Telephone Encounter (Signed)
Patient's daughter, [name] called and stated that patient was suppose to have [place] ordered through the hospital when she was discharged. Stated that she requested [facility] but no one has called them to come out. Daughter wants to know if you know anything about this and what to do. Please Advise.

## 2019-04-22 NOTE — Telephone Encounter (Signed)
She would like for you to place the order and see if they can come out.

## 2019-04-23 ENCOUNTER — Other Ambulatory Visit: Payer: Self-pay | Admitting: Internal Medicine

## 2019-04-23 DIAGNOSIS — M25511 Pain in right shoulder: Secondary | ICD-10-CM | POA: Diagnosis not present

## 2019-04-23 DIAGNOSIS — R41841 Cognitive communication deficit: Secondary | ICD-10-CM | POA: Diagnosis not present

## 2019-04-23 DIAGNOSIS — F1010 Alcohol abuse, uncomplicated: Secondary | ICD-10-CM

## 2019-04-23 DIAGNOSIS — M545 Low back pain: Secondary | ICD-10-CM | POA: Diagnosis not present

## 2019-04-28 DIAGNOSIS — M25511 Pain in right shoulder: Secondary | ICD-10-CM | POA: Diagnosis not present

## 2019-04-28 DIAGNOSIS — R41841 Cognitive communication deficit: Secondary | ICD-10-CM | POA: Diagnosis not present

## 2019-04-28 DIAGNOSIS — M545 Low back pain: Secondary | ICD-10-CM | POA: Diagnosis not present

## 2019-04-30 DIAGNOSIS — R41841 Cognitive communication deficit: Secondary | ICD-10-CM | POA: Diagnosis not present

## 2019-04-30 DIAGNOSIS — M25511 Pain in right shoulder: Secondary | ICD-10-CM | POA: Diagnosis not present

## 2019-04-30 DIAGNOSIS — M545 Low back pain: Secondary | ICD-10-CM | POA: Diagnosis not present

## 2019-05-05 DIAGNOSIS — M545 Low back pain: Secondary | ICD-10-CM | POA: Diagnosis not present

## 2019-05-05 DIAGNOSIS — R41841 Cognitive communication deficit: Secondary | ICD-10-CM | POA: Diagnosis not present

## 2019-05-05 DIAGNOSIS — Z23 Encounter for immunization: Secondary | ICD-10-CM | POA: Diagnosis not present

## 2019-05-05 DIAGNOSIS — M25511 Pain in right shoulder: Secondary | ICD-10-CM | POA: Diagnosis not present

## 2019-05-07 DIAGNOSIS — R41841 Cognitive communication deficit: Secondary | ICD-10-CM | POA: Diagnosis not present

## 2019-05-07 DIAGNOSIS — M25511 Pain in right shoulder: Secondary | ICD-10-CM | POA: Diagnosis not present

## 2019-05-07 DIAGNOSIS — M545 Low back pain: Secondary | ICD-10-CM | POA: Diagnosis not present

## 2019-05-08 ENCOUNTER — Other Ambulatory Visit: Payer: Self-pay

## 2019-05-08 ENCOUNTER — Other Ambulatory Visit: Payer: Medicare Other

## 2019-05-08 DIAGNOSIS — G250 Essential tremor: Secondary | ICD-10-CM | POA: Diagnosis not present

## 2019-05-08 DIAGNOSIS — E7800 Pure hypercholesterolemia, unspecified: Secondary | ICD-10-CM

## 2019-05-09 LAB — COMPLETE METABOLIC PANEL WITH GFR
AG Ratio: 1.5 (calc) (ref 1.0–2.5)
ALT: 18 U/L (ref 6–29)
AST: 18 U/L (ref 10–35)
Albumin: 3.7 g/dL (ref 3.6–5.1)
Alkaline phosphatase (APISO): 93 U/L (ref 37–153)
BUN: 11 mg/dL (ref 7–25)
CO2: 22 mmol/L (ref 20–32)
Calcium: 10.1 mg/dL (ref 8.6–10.4)
Chloride: 107 mmol/L (ref 98–110)
Creat: 0.69 mg/dL (ref 0.60–0.88)
GFR, Est African American: 91 mL/min/{1.73_m2} (ref >=60)
GFR, Est Non African American: 79 mL/min/{1.73_m2} (ref >=60)
Globulin: 2.4 g/dL (calc) (ref 1.9–3.7)
Glucose, Bld: 91 mg/dL (ref 65–99)
Potassium: 4.4 mmol/L (ref 3.5–5.3)
Sodium: 140 mmol/L (ref 135–146)
Total Bilirubin: 0.3 mg/dL (ref 0.2–1.2)
Total Protein: 6.1 g/dL (ref 6.1–8.1)

## 2019-05-09 LAB — TSH: TSH: 5.19 mIU/L — ABNORMAL HIGH (ref 0.40–4.50)

## 2019-05-09 LAB — CBC WITH DIFFERENTIAL/PLATELET
Absolute Monocytes: 882 cells/uL (ref 200–950)
Basophils Absolute: 59 cells/uL (ref 0–200)
Basophils Relative: 0.7 %
Eosinophils Absolute: 412 cells/uL (ref 15–500)
Eosinophils Relative: 4.9 %
HCT: 36.8 % (ref 35.0–45.0)
Hemoglobin: 12.7 g/dL (ref 11.7–15.5)
Lymphs Abs: 1932 cells/uL (ref 850–3900)
MCH: 33.1 pg — ABNORMAL HIGH (ref 27.0–33.0)
MCHC: 34.5 g/dL (ref 32.0–36.0)
MCV: 95.8 fL (ref 80.0–100.0)
MPV: 11 fL (ref 7.5–12.5)
Monocytes Relative: 10.5 %
Neutro Abs: 5116 cells/uL (ref 1500–7800)
Neutrophils Relative %: 60.9 %
Platelets: 321 10*3/uL (ref 140–400)
RBC: 3.84 10*6/uL (ref 3.80–5.10)
RDW: 12.5 % (ref 11.0–15.0)
Total Lymphocyte: 23 %
WBC: 8.4 10*3/uL (ref 3.8–10.8)

## 2019-05-09 LAB — LIPID PANEL
Cholesterol: 225 mg/dL — ABNORMAL HIGH (ref <200)
HDL: 48 mg/dL — ABNORMAL LOW (ref >=50)
LDL Cholesterol (Calc): 154 mg/dL (calc) — ABNORMAL HIGH
Non-HDL Cholesterol (Calc): 177 mg/dL (calc) — ABNORMAL HIGH (ref <130)
Total CHOL/HDL Ratio: 4.7 (calc) (ref <5.0)
Triglycerides: 116 mg/dL (ref <150)

## 2019-05-12 DIAGNOSIS — M545 Low back pain: Secondary | ICD-10-CM | POA: Diagnosis not present

## 2019-05-12 DIAGNOSIS — M25511 Pain in right shoulder: Secondary | ICD-10-CM | POA: Diagnosis not present

## 2019-05-12 DIAGNOSIS — R41841 Cognitive communication deficit: Secondary | ICD-10-CM | POA: Diagnosis not present

## 2019-05-14 ENCOUNTER — Non-Acute Institutional Stay: Payer: Medicare Other | Admitting: Internal Medicine

## 2019-05-14 ENCOUNTER — Other Ambulatory Visit: Payer: Self-pay

## 2019-05-14 ENCOUNTER — Encounter: Payer: Self-pay | Admitting: Internal Medicine

## 2019-05-14 VITALS — BP 112/68 | HR 89 | Temp 98.2°F | Ht 62.0 in | Wt 137.0 lb

## 2019-05-14 DIAGNOSIS — G250 Essential tremor: Secondary | ICD-10-CM | POA: Diagnosis not present

## 2019-05-14 DIAGNOSIS — K5901 Slow transit constipation: Secondary | ICD-10-CM | POA: Diagnosis not present

## 2019-05-14 DIAGNOSIS — M25511 Pain in right shoulder: Secondary | ICD-10-CM | POA: Diagnosis not present

## 2019-05-14 DIAGNOSIS — M545 Low back pain: Secondary | ICD-10-CM | POA: Diagnosis not present

## 2019-05-14 DIAGNOSIS — R41841 Cognitive communication deficit: Secondary | ICD-10-CM | POA: Diagnosis not present

## 2019-05-14 DIAGNOSIS — K2920 Alcoholic gastritis without bleeding: Secondary | ICD-10-CM

## 2019-05-14 DIAGNOSIS — E785 Hyperlipidemia, unspecified: Secondary | ICD-10-CM

## 2019-05-14 DIAGNOSIS — F1010 Alcohol abuse, uncomplicated: Secondary | ICD-10-CM

## 2019-05-14 DIAGNOSIS — F5101 Primary insomnia: Secondary | ICD-10-CM

## 2019-05-14 NOTE — Addendum Note (Signed)
Addended by: Georgina Snell on: 05/14/2019 07:14 PM   Modules accepted: Level of Service

## 2019-05-14 NOTE — Progress Notes (Signed)
Location: [place] of Service:  Clinic (12)  Provider:   Code Status:  Goals of Care:  Advanced Directives 04/06/2019  Does Patient Have a Medical Advance Directive? Yes  Type of Paramedic of [place];Living will  Does patient want to make changes to medical advance directive? No - Patient declined  Copy of [place] in Chart? No - copy requested  Would patient like information on creating a medical advance directive? -  Pre-existing out of facility DNR order (yellow form or pink MOST form) -     Chief Complaint  Patient presents with  . Medical Management of Chronic Issues    Patient returns to the clinic to discuss labs  . Health Maintenance    Mammogram    HPI: Patient is a 86 y.o. female seen today for an acute visit for  Follow up   Patient was admitted to the Hospital from 12/05-12/09 for GI symptoms due to Excessive Alcohol use and she did go into Alcohol withdrawal in the Hospital  Patient has history ofhyperlipidemia, arthritis status post right hip arthroplasty, essential tremors,anxiety and constipation Patient lives in [place]. Is independent in her ADLs and IADLs. Has a daughter who is in town and is Retail banker. Alcohol Abuse Says has Quit Completely Does not have a way to access it Cognitive Impairment Does not want to take anything for her memory Working with Speech Hyperlipidemia Says has not been walking or eating right Gastritis No Symptoms anymore Not taking Omeprazole anymore InSomnia' Never took Melatonin Not sure if she wants to take Neurontin Past Medical History:  Diagnosis Date  . Allergic rhinitis due to pollen   . Anxiety state, unspecified   . Arthritis    RIGHT LEG, HANDS.  [name] Cancer (HCC)    FACE/NOSE -SKIN-squamous cell and basal cell.  . Disorder of bone and cartilage, unspecified   . Lumbago   . Lyme borreliosis   . Other atopic dermatitis and related  conditions   . Other dysphagia   . Reflux esophagitis   . Senile osteoporosis   . TMJ dysfunction    MORE ON LEFT "WEARS BITE PROTECTION AT NIGHT"    Past Surgical History:  Procedure Laterality Date  . APPENDECTOMY  1950  . CATARACT EXTRACTION, BILATERAL Bilateral   . EYE SURGERY Right    right eye ptrigium excision x2  . HERNIA REPAIR Bilateral   . TOTAL HIP ARTHROPLASTY Right 12/10/2015   Procedure: RIGHT TOTAL HIP ARTHROPLASTY ANTERIOR APPROACH;  Surgeon: [name], MD;  Location: [facility];  Service: Orthopedics;  Laterality: Right;  Spinal to General    Allergies  Allergen Reactions  . Brandy [Alcohol] Anaphylaxis    Related to grapes  . Prune Anaphylaxis    PLUMS,GRAPES,RAISINS  . Aspirin Other (See Comments)    Stomach bleed  . Augmentin [Amoxicillin-Pot Clavulanate] Hives and Itching  . Clindamycin Other (See Comments)    unkown  . Omnicef [Cefdinir] Hives  . Vinegar [Acetic Acid]     basalmic  . Alprazolam Rash  . Ceftin [Cefuroxime Axetil] Rash  . Doxycycline Rash  . Eryc [Erythromycin] Rash  . Penicillins Rash    Outpatient Encounter Medications as of 05/14/2019  Medication Sig  . acetaminophen (TYLENOL) 325 MG tablet Take 650 mg by mouth every 6 (six) hours as needed for mild pain.  . cholecalciferol (VITAMIN D) 1000 UNITS tablet Take 1,000 Units by mouth daily.  [name] EPINEPHrine (EPIPEN 2-PAK) 0.3  mg/0.3 mL IJ SOAJ injection Inject 0.3 mLs (0.3 mg total) into the muscle as needed for anaphylaxis.  [name] gabapentin (NEURONTIN) 300 MG capsule Take 1 capsule (300 mg total) by mouth at bedtime.  [name] GARLIC PO Take 1 tablet by mouth daily.  [name] latanoprost (XALATAN) 0.005 % ophthalmic solution Place 1 drop into both eyes at bedtime.  . senna (SENOKOT) 8.6 MG TABS tablet Take 1 tablet by mouth daily as needed for mild constipation.  [name] omeprazole (PRILOSEC) 40 MG capsule Take 1 capsule (40 mg total) by mouth daily.   No facility-administered encounter  medications on file as of 05/14/2019.    Review of Systems:  Review of Systems  Review of Systems  Constitutional: Negative for activity change, appetite change, chills, diaphoresis, fatigue and fever.  HENT: Negative for mouth sores, postnasal drip, rhinorrhea, sinus pain and sore throat.   Respiratory: Negative for apnea, cough, chest tightness, shortness of breath and wheezing.   Cardiovascular: Negative for chest pain, palpitations and leg swelling.  Gastrointestinal: Negative for abdominal distention, abdominal pain, constipation, diarrhea, nausea and vomiting.  Genitourinary: Negative for dysuria and frequency.  Musculoskeletal: Negative for arthralgias, joint swelling and myalgias.  Skin: Negative for rash.  Neurological: Negative for dizziness, syncope, weakness, light-headedness and numbness.  Psychiatric/Behavioral: Negative for behavioral problems, confusion and sleep disturbance.     Health Maintenance  Topic Date Due  . MAMMOGRAM  03/30/2018  . TETANUS/TDAP  05/03/2025  . INFLUENZA VACCINE  Completed  . DEXA SCAN  Completed  . PNA vac Low Risk Adult  Completed    Physical Exam: Vitals:   05/14/19 1440  BP: 112/68  Pulse: 89  Temp: 98.2 F (36.8 C)  SpO2: 94%  Weight: 137 lb (62.1 kg)  Height: 5\' 2"  (1.575 m)   Body mass index is 25.06 kg/m. Physical Exam  Constitutional: Oriented to person, place, and time. Well-developed and well-nourished.  HENT:  Head: Normocephalic.  Mouth/Throat: Oropharynx is clear and moist.  Eyes: Pupils are equal, round, and reactive to light.  Neck: Neck supple.  Cardiovascular: Normal rate and normal heart sounds.  No murmur heard. Pulmonary/Chest: Effort normal and breath sounds normal. No respiratory distress. No wheezes. She has no rales.  Abdominal: Soft. Bowel sounds are normal. No distension. There is no tenderness. There is no rebound.  Musculoskeletal: No edema.  Lymphadenopathy: none Neurological: Alert and  oriented to person, place, and time. Walks with No Deficits No Cane Or walker Skin: Skin is warm and dry.  Psychiatric: Normal mood and affect. Behavior is normal. Thought content normal.    Labs reviewed: Basic Metabolic Panel: Recent Labs    04/08/19 0327 04/09/19 0817 05/08/19 0750  NA 135 134* 140  K 3.4* 3.8 4.4  CL 106 102 107  CO2 23 23 22   GLUCOSE 100* 102* 91  BUN 7* 6* 11  CREATININE 0.71 0.70 0.69  CALCIUM 9.0 9.7 10.1  MG 2.1 2.0  --   TSH  --   --  5.19*   Liver Function Tests: Recent Labs    04/07/19 0455 04/08/19 0327 04/09/19 0817 05/08/19 0750  AST 33 24 24 18   ALT 38 31 32 18  ALKPHOS 71 60 70  --   BILITOT 0.9 0.6 0.8 0.3  PROT 5.7* 5.2* 6.1* 6.1  ALBUMIN 3.0* 2.9* 3.0*  --    Recent Labs    04/06/19 1429  LIPASE 35   No results for input(s): AMMONIA in the last 8760 hours. CBC: Recent  Labs    04/05/19 1758 04/08/19 0327 04/09/19 0817 05/08/19 0750  WBC 9.3 8.3 8.4 8.4  NEUTROABS 5.2  --  5.3 5,116  HGB 13.9 11.9* 12.6 12.7  HCT 41.9 35.1* 37.9 36.8  MCV 103.5* 101.4* 102.7* 95.8  PLT 217 175 199 321   Lipid Panel: Recent Labs    04/06/19 0147 05/08/19 0750  CHOL 240* 225*  HDL 95 48*  LDLCALC 137* 154*  TRIG 39 116  CHOLHDL 2.5 4.7   No results found for: HGBA1C  Procedures since last visit: No results found.  Assessment/Plan Alcohol abuse Says she is doing well with no Recent Use  Acute alcoholic gastritis without hemorrhage Resolved now not taking Omeprazole anymore  Slow transit constipation Resolved for now  Tremor, essential Resolved since she quit Alcohal  Hyperlipidemia  We talked about her Risk and benefit of taking statin. She is not interested.  Wants to try Exercise and Diet first  Primary insomnia Was started on Neurontin in hospital Not sure if she wants to continue it. Will call our office if needs renewal Cognitive Impairnement MMSE is 25/30 Missed one recall. Could not Count Backwards  Failed Clock Drawing    Labs/tests ordered:  * No order type specified * Next appt:  Visit date not found

## 2019-05-19 DIAGNOSIS — M545 Low back pain: Secondary | ICD-10-CM | POA: Diagnosis not present

## 2019-05-19 DIAGNOSIS — M25511 Pain in right shoulder: Secondary | ICD-10-CM | POA: Diagnosis not present

## 2019-05-19 DIAGNOSIS — R41841 Cognitive communication deficit: Secondary | ICD-10-CM | POA: Diagnosis not present

## 2019-05-21 DIAGNOSIS — M545 Low back pain: Secondary | ICD-10-CM | POA: Diagnosis not present

## 2019-05-21 DIAGNOSIS — M25511 Pain in right shoulder: Secondary | ICD-10-CM | POA: Diagnosis not present

## 2019-05-21 DIAGNOSIS — R41841 Cognitive communication deficit: Secondary | ICD-10-CM | POA: Diagnosis not present

## 2019-05-26 DIAGNOSIS — R41841 Cognitive communication deficit: Secondary | ICD-10-CM | POA: Diagnosis not present

## 2019-05-26 DIAGNOSIS — M25511 Pain in right shoulder: Secondary | ICD-10-CM | POA: Diagnosis not present

## 2019-05-26 DIAGNOSIS — M545 Low back pain: Secondary | ICD-10-CM | POA: Diagnosis not present

## 2019-05-28 DIAGNOSIS — M25511 Pain in right shoulder: Secondary | ICD-10-CM | POA: Diagnosis not present

## 2019-05-28 DIAGNOSIS — M545 Low back pain: Secondary | ICD-10-CM | POA: Diagnosis not present

## 2019-05-28 DIAGNOSIS — R41841 Cognitive communication deficit: Secondary | ICD-10-CM | POA: Diagnosis not present

## 2019-06-02 DIAGNOSIS — Z23 Encounter for immunization: Secondary | ICD-10-CM | POA: Diagnosis not present

## 2019-06-02 DIAGNOSIS — M25511 Pain in right shoulder: Secondary | ICD-10-CM | POA: Diagnosis not present

## 2019-06-02 DIAGNOSIS — R41841 Cognitive communication deficit: Secondary | ICD-10-CM | POA: Diagnosis not present

## 2019-06-02 DIAGNOSIS — M545 Low back pain: Secondary | ICD-10-CM | POA: Diagnosis not present

## 2019-06-04 DIAGNOSIS — R41841 Cognitive communication deficit: Secondary | ICD-10-CM | POA: Diagnosis not present

## 2019-06-04 DIAGNOSIS — M25511 Pain in right shoulder: Secondary | ICD-10-CM | POA: Diagnosis not present

## 2019-06-04 DIAGNOSIS — M545 Low back pain: Secondary | ICD-10-CM | POA: Diagnosis not present

## 2019-06-05 ENCOUNTER — Encounter: Payer: Self-pay | Admitting: Internal Medicine

## 2019-06-06 DIAGNOSIS — L821 Other seborrheic keratosis: Secondary | ICD-10-CM | POA: Diagnosis not present

## 2019-06-06 DIAGNOSIS — L570 Actinic keratosis: Secondary | ICD-10-CM | POA: Diagnosis not present

## 2019-06-06 DIAGNOSIS — Z85828 Personal history of other malignant neoplasm of skin: Secondary | ICD-10-CM | POA: Diagnosis not present

## 2019-06-06 DIAGNOSIS — D2272 Melanocytic nevi of left lower limb, including hip: Secondary | ICD-10-CM | POA: Diagnosis not present

## 2019-06-06 DIAGNOSIS — D224 Melanocytic nevi of scalp and neck: Secondary | ICD-10-CM | POA: Diagnosis not present

## 2019-06-06 DIAGNOSIS — Z23 Encounter for immunization: Secondary | ICD-10-CM | POA: Diagnosis not present

## 2019-06-06 DIAGNOSIS — L578 Other skin changes due to chronic exposure to nonionizing radiation: Secondary | ICD-10-CM | POA: Diagnosis not present

## 2019-06-09 DIAGNOSIS — M25511 Pain in right shoulder: Secondary | ICD-10-CM | POA: Diagnosis not present

## 2019-06-09 DIAGNOSIS — M545 Low back pain: Secondary | ICD-10-CM | POA: Diagnosis not present

## 2019-06-09 DIAGNOSIS — R41841 Cognitive communication deficit: Secondary | ICD-10-CM | POA: Diagnosis not present

## 2019-06-11 DIAGNOSIS — M545 Low back pain: Secondary | ICD-10-CM | POA: Diagnosis not present

## 2019-06-11 DIAGNOSIS — M25511 Pain in right shoulder: Secondary | ICD-10-CM | POA: Diagnosis not present

## 2019-06-11 DIAGNOSIS — R41841 Cognitive communication deficit: Secondary | ICD-10-CM | POA: Diagnosis not present

## 2019-06-16 DIAGNOSIS — M25511 Pain in right shoulder: Secondary | ICD-10-CM | POA: Diagnosis not present

## 2019-06-16 DIAGNOSIS — R41841 Cognitive communication deficit: Secondary | ICD-10-CM | POA: Diagnosis not present

## 2019-06-16 DIAGNOSIS — M545 Low back pain: Secondary | ICD-10-CM | POA: Diagnosis not present

## 2019-06-18 DIAGNOSIS — M25511 Pain in right shoulder: Secondary | ICD-10-CM | POA: Diagnosis not present

## 2019-06-18 DIAGNOSIS — R41841 Cognitive communication deficit: Secondary | ICD-10-CM | POA: Diagnosis not present

## 2019-06-18 DIAGNOSIS — M545 Low back pain: Secondary | ICD-10-CM | POA: Diagnosis not present

## 2019-07-02 DIAGNOSIS — H40053 Ocular hypertension, bilateral: Secondary | ICD-10-CM | POA: Diagnosis not present

## 2019-07-03 ENCOUNTER — Other Ambulatory Visit: Payer: Self-pay | Admitting: Internal Medicine

## 2019-07-04 ENCOUNTER — Telehealth: Payer: Self-pay

## 2019-07-04 NOTE — Telephone Encounter (Signed)
Patient was called and phone went to voicemail. Patient was left a voicemail with call back number.

## 2019-07-04 NOTE — Telephone Encounter (Signed)
This medication was removed from medication list 04/16/2019. Please Advise.

## 2019-07-04 NOTE — Telephone Encounter (Signed)
Can you call the Patient and ask her if she is still taking this medication ?. Per my Note she was not sure at that time she wants to continue it.

## 2019-07-04 NOTE — Telephone Encounter (Signed)
Patient pharmacy sent over medication to be refilled. Patient medication was discontinued on 04/16/2019. Please Advise.

## 2019-07-08 ENCOUNTER — Encounter: Payer: Self-pay | Admitting: Internal Medicine

## 2019-07-16 ENCOUNTER — Encounter: Payer: Self-pay | Admitting: Internal Medicine

## 2019-08-04 ENCOUNTER — Other Ambulatory Visit: Payer: Self-pay | Admitting: Internal Medicine

## 2019-09-04 ENCOUNTER — Other Ambulatory Visit: Payer: Self-pay

## 2019-09-04 DIAGNOSIS — F1010 Alcohol abuse, uncomplicated: Secondary | ICD-10-CM

## 2019-09-04 DIAGNOSIS — E785 Hyperlipidemia, unspecified: Secondary | ICD-10-CM

## 2019-09-04 LAB — LIPID PANEL
Cholesterol: 235 mg/dL — ABNORMAL HIGH (ref <200)
HDL: 63 mg/dL (ref >=50)
LDL Cholesterol (Calc): 147 mg/dL (calc) — ABNORMAL HIGH
Non-HDL Cholesterol (Calc): 172 mg/dL (calc) — ABNORMAL HIGH (ref <130)
Total CHOL/HDL Ratio: 3.7 (calc) (ref <5.0)
Triglycerides: 126 mg/dL (ref <150)

## 2019-09-04 LAB — COMPLETE METABOLIC PANEL WITH GFR
AG Ratio: 1.9 (calc) (ref 1.0–2.5)
ALT: 13 U/L (ref 6–29)
AST: 18 U/L (ref 10–35)
Albumin: 4.1 g/dL (ref 3.6–5.1)
Alkaline phosphatase (APISO): 82 U/L (ref 37–153)
BUN: 15 mg/dL (ref 7–25)
CO2: 28 mmol/L (ref 20–32)
Calcium: 10.3 mg/dL (ref 8.6–10.4)
Chloride: 105 mmol/L (ref 98–110)
Creat: 0.79 mg/dL (ref 0.60–0.88)
GFR, Est African American: 79 mL/min/{1.73_m2} (ref >=60)
GFR, Est Non African American: 68 mL/min/{1.73_m2} (ref >=60)
Globulin: 2.2 g/dL (calc) (ref 1.9–3.7)
Glucose, Bld: 87 mg/dL (ref 65–99)
Potassium: 4.3 mmol/L (ref 3.5–5.3)
Sodium: 138 mmol/L (ref 135–146)
Total Bilirubin: 0.4 mg/dL (ref 0.2–1.2)
Total Protein: 6.3 g/dL (ref 6.1–8.1)

## 2019-09-04 LAB — CBC WITH DIFFERENTIAL/PLATELET
Absolute Monocytes: 806 cells/uL (ref 200–950)
Basophils Absolute: 68 cells/uL (ref 0–200)
Basophils Relative: 0.9 %
Eosinophils Absolute: 251 cells/uL (ref 15–500)
Eosinophils Relative: 3.3 %
HCT: 40.6 % (ref 35.0–45.0)
Hemoglobin: 13.3 g/dL (ref 11.7–15.5)
Lymphs Abs: 2151 cells/uL (ref 850–3900)
MCH: 30 pg (ref 27.0–33.0)
MCHC: 32.8 g/dL (ref 32.0–36.0)
MCV: 91.4 fL (ref 80.0–100.0)
MPV: 11.8 fL (ref 7.5–12.5)
Monocytes Relative: 10.6 %
Neutro Abs: 4324 cells/uL (ref 1500–7800)
Neutrophils Relative %: 56.9 %
Platelets: 256 10*3/uL (ref 140–400)
RBC: 4.44 10*6/uL (ref 3.80–5.10)
RDW: 13.1 % (ref 11.0–15.0)
Total Lymphocyte: 28.3 %
WBC: 7.6 10*3/uL (ref 3.8–10.8)

## 2019-09-10 ENCOUNTER — Non-Acute Institutional Stay: Payer: Medicare Other | Admitting: Internal Medicine

## 2019-09-10 ENCOUNTER — Encounter: Payer: Self-pay | Admitting: Internal Medicine

## 2019-09-10 ENCOUNTER — Other Ambulatory Visit: Payer: Self-pay

## 2019-09-10 VITALS — BP 122/58 | HR 86 | Temp 98.1°F | Ht 62.0 in | Wt 140.2 lb

## 2019-09-10 DIAGNOSIS — R4189 Other symptoms and signs involving cognitive functions and awareness: Secondary | ICD-10-CM

## 2019-09-10 DIAGNOSIS — H9201 Otalgia, right ear: Secondary | ICD-10-CM

## 2019-09-10 DIAGNOSIS — E785 Hyperlipidemia, unspecified: Secondary | ICD-10-CM

## 2019-09-10 DIAGNOSIS — F5101 Primary insomnia: Secondary | ICD-10-CM

## 2019-09-10 DIAGNOSIS — M25511 Pain in right shoulder: Secondary | ICD-10-CM | POA: Diagnosis not present

## 2019-09-10 DIAGNOSIS — K2920 Alcoholic gastritis without bleeding: Secondary | ICD-10-CM

## 2019-09-10 DIAGNOSIS — F1010 Alcohol abuse, uncomplicated: Secondary | ICD-10-CM | POA: Diagnosis not present

## 2019-09-10 MED ORDER — ATORVASTATIN CALCIUM 10 MG PO TABS
10.0000 mg | ORAL_TABLET | Freq: Every day | ORAL | 3 refills | Status: DC
Start: 2019-09-10 — End: 2019-10-15

## 2019-09-10 MED ORDER — MELOXICAM 7.5 MG PO TABS
7.5000 mg | ORAL_TABLET | Freq: Every day | ORAL | 0 refills | Status: DC
Start: 2019-09-10 — End: 2019-10-15

## 2019-09-10 MED ORDER — CIPROFLOXACIN-DEXAMETHASONE 0.3-0.1 % OT SUSP: 4.0000 [drp] | Freq: Two times a day (BID) | OTIC | 0 refills | Status: DC

## 2019-09-10 NOTE — Progress Notes (Signed)
Location:  [place] of Service:  Clinic (12)  Provider:   Code Status:  Goals of Care:  Advanced Directives 04/06/2019  Does Patient Have a Medical Advance Directive? Yes  Type of Paramedic of [place];Living will  Does patient want to make changes to medical advance directive? No - Patient declined  Copy of [place] in Chart? No - copy requested  Would patient like information on creating a medical advance directive? -  Pre-existing out of facility DNR order (yellow form or pink MOST form) -     Chief Complaint  Patient presents with  . Medical Management of Chronic Issues    4 month follow up. Patient would like to discuss her right shoulder/arm pain. Patient also states from her waist and up her back to her neck she has some tension. Patient is seeing an eye doctor.     HPI: Patient is a 86 y.o. female seen today for medical management of chronic diseases.   Patient was admitted to the Hospital from 12/05-12/09 for GI symptoms due to Excessive Alcohol use and she did go into Alcohol withdrawal in the Hospital Patient has history ofhyperlipidemia, arthritis status post right hip arthroplasty, essential tremors,anxiety and constipation Patient lives in [place]. Is independent in her ADLs and IADLs. Has a daughter who is in town and is Retail banker for another facility  Right shoulder Pain Noticed few weeks ago. Not constant. Bothers sometimes.   H/o Alcohol abuse Quit Completely Per Patient Insomnia Doing well on Neurontin Right Ear Pain  Has been Putting some kind of oil with her Q tip and Now having pain in that Ear  Past Medical History:  Diagnosis Date  . Allergic rhinitis due to pollen   . Anxiety state, unspecified   . Arthritis    RIGHT LEG, HANDS.  [name] Cancer (HCC)    FACE/NOSE -SKIN-squamous cell and basal cell.  . Disorder of bone and cartilage, unspecified   . Lumbago   . Lyme borreliosis    . Other atopic dermatitis and related conditions   . Other dysphagia   . Reflux esophagitis   . Senile osteoporosis   . TMJ dysfunction    MORE ON LEFT "WEARS BITE PROTECTION AT NIGHT"    Past Surgical History:  Procedure Laterality Date  . APPENDECTOMY  1950  . CATARACT EXTRACTION, BILATERAL Bilateral   . EYE SURGERY Right    right eye ptrigium excision x2  . HERNIA REPAIR Bilateral   . TOTAL HIP ARTHROPLASTY Right 12/10/2015   Procedure: RIGHT TOTAL HIP ARTHROPLASTY ANTERIOR APPROACH;  Surgeon: [name], MD;  Location: [facility];  Service: Orthopedics;  Laterality: Right;  Spinal to General    Allergies  Allergen Reactions  . Brandy [Alcohol] Anaphylaxis    Related to grapes  . Prune Anaphylaxis    PLUMS,GRAPES,RAISINS  . Aspirin Other (See Comments)    Stomach bleed  . Augmentin [Amoxicillin-Pot Clavulanate] Hives and Itching  . Clindamycin Other (See Comments)    unkown  . Omnicef [Cefdinir] Hives  . Vinegar [Acetic Acid]     basalmic  . Alprazolam Rash  . Ceftin [Cefuroxime Axetil] Rash  . Doxycycline Rash  . Eryc [Erythromycin] Rash  . Penicillins Rash    Outpatient Encounter Medications as of 09/10/2019  Medication Sig  . acetaminophen (TYLENOL) 325 MG tablet Take 650 mg by mouth every 6 (six) hours as needed for mild pain.  . cholecalciferol (VITAMIN D) 1000  UNITS tablet Take 1,000 Units by mouth daily.  [name] EPINEPHrine (EPIPEN 2-PAK) 0.3 mg/0.3 mL IJ SOAJ injection Inject 0.3 mLs (0.3 mg total) into the muscle as needed for anaphylaxis.  [name] gabapentin (NEURONTIN) 300 MG capsule TAKE 1 CAPSULE AT BEDTIME.  [name] GARLIC PO Take 1 tablet by mouth daily.  [name] latanoprost (XALATAN) 0.005 % ophthalmic solution Place 1 drop into both eyes at bedtime.  . senna (SENOKOT) 8.6 MG TABS tablet Take 1 tablet by mouth daily as needed for mild constipation.  [name] atorvastatin (LIPITOR) 10 MG tablet Take 1 tablet (10 mg total) by mouth daily.  . ciprofloxacin-dexamethasone  (CIPRODEX) OTIC suspension Place 4 drops into the right ear 2 (two) times daily.  . meloxicam (MOBIC) 7.5 MG tablet Take 1 tablet (7.5 mg total) by mouth daily. Take it with food  . [DISCONTINUED] omeprazole (PRILOSEC) 40 MG capsule Take 1 capsule (40 mg total) by mouth daily.   No facility-administered encounter medications on file as of 09/10/2019.    Review of Systems:  Review of Systems  Constitutional: Negative.   HENT: Positive for ear pain.   Respiratory: Negative.   Cardiovascular: Negative.   Gastrointestinal: Negative.   Genitourinary: Negative.   Musculoskeletal: Positive for arthralgias and myalgias.  Skin: Negative.   Neurological: Negative.   Psychiatric/Behavioral: Positive for confusion.  All other systems reviewed and are negative.   Health Maintenance  Topic Date Due  . MAMMOGRAM  09/09/2020 (Originally 03/30/2018)  . INFLUENZA VACCINE  11/30/2019  . TETANUS/TDAP  05/03/2025  . DEXA SCAN  Completed  . COVID-19 Vaccine  Completed  . PNA vac Low Risk Adult  Completed    Physical Exam: Vitals:   09/10/19 1311  BP: (!) 122/58  Pulse: 86  Temp: 98.1 F (36.7 C)  SpO2: 98%  Weight: 140 lb 3.2 oz (63.6 kg)  Height: 5\' 2"  (1.575 m)   Body mass index is 25.64 kg/m. Physical Exam  Constitutional: Oriented to person, place, and time. Well-developed and well-nourished.  HENT:  Head: Normocephalic.  Mouth/Throat: Oropharynx is clear and moist.  Ear  Mild External Otitis Media in right Ear with Wax Eyes: Pupils are equal, round, and reactive to light.  Neck: Neck supple.  Cardiovascular: Normal rate and normal heart sounds.  No murmur heard. Pulmonary/Chest: Effort normal and breath sounds normal. No respiratory distress. No wheezes. She has no rales.  Abdominal: Soft. Bowel sounds are normal. No distension. There is no tenderness. There is no rebound.  Musculoskeletal: No edema.  Right Shoulder exam No Swelling or warm Range of motion was not limited.  No Tender Lymphadenopathy: none Neurological: Alert and oriented to person, place, and time.  No Focal Deficit Skin: Skin is warm and dry.  Psychiatric: Normal mood and affect. Behavior is normal. Thought content normal.    Labs reviewed: Basic Metabolic Panel: Recent Labs    04/08/19 0327 04/08/19 0327 04/09/19 0817 05/08/19 0750 09/04/19 0800  NA 135   < > 134* 140 138  K 3.4*   < > 3.8 4.4 4.3  CL 106   < > 102 107 105  CO2 23   < > 23 22 28   GLUCOSE 100*   < > 102* 91 87  BUN 7*   < > 6* 11 15  CREATININE 0.71   < > 0.70 0.69 0.79  CALCIUM 9.0   < > 9.7 10.1 10.3  MG 2.1  --  2.0  --   --   TSH  --   --   --  5.19*  --    < > = values in this interval not displayed.   Liver Function Tests: Recent Labs    04/07/19 0455 04/07/19 0455 04/08/19 0327 04/08/19 0327 04/09/19 0817 05/08/19 0750 09/04/19 0800  AST 33   < > 24   < > 24 18 18   ALT 38   < > 31   < > 32 18 13  ALKPHOS 71  --  60  --  70  --   --   BILITOT 0.9   < > 0.6   < > 0.8 0.3 0.4  PROT 5.7*   < > 5.2*   < > 6.1* 6.1 6.3  ALBUMIN 3.0*  --  2.9*  --  3.0*  --   --    < > = values in this interval not displayed.   Recent Labs    04/06/19 1429  LIPASE 35   No results for input(s): AMMONIA in the last 8760 hours. CBC: Recent Labs    04/09/19 0817 05/08/19 0750 09/04/19 0800  WBC 8.4 8.4 7.6  NEUTROABS 5.3 5,116 4,324  HGB 12.6 12.7 13.3  HCT 37.9 36.8 40.6  MCV 102.7* 95.8 91.4  PLT 199 321 256   Lipid Panel: Recent Labs    04/06/19 0147 05/08/19 0750 09/04/19 0800  CHOL 240* 225* 235*  HDL 95 48* 63  LDLCALC 137* 154* 147*  TRIG 39 116 126  CHOLHDL 2.5 4.7 3.7   No results found for: HGBA1C  Procedures since last visit: No results found.  Assessment/Plan Acute pain of right shoulder Exam Benign Will try Meloxicam for 2 weeks If pain persists Ortho Consult  Hyperlipidemia with target LDL less than 100 Agreeing to start Statin Right ear pain Ciprodex for 2  weeks Stop using Q tip Alcohol abuse Doing well off alcohal Acute alcoholic gastritis without hemorrhage On Omeprazole prn Primary insomnia Doing well on Neurontin  Cognitive impairment MMSE is 25/30 Missed one recall. Could not Count Backwards Failed Clock Drawing Refusing Meds right now    Labs/tests ordered:  * No order type specified * Next appt:  12/05/2019  Total time spent in this patient care encounter was  45_  minutes; greater than 50% of the visit spent counseling patient and staff, reviewing records , Labs and coordinating care for problems addressed at this encounter.

## 2019-09-19 ENCOUNTER — Telehealth: Payer: Self-pay

## 2019-09-19 DIAGNOSIS — M25511 Pain in right shoulder: Secondary | ICD-10-CM

## 2019-09-19 NOTE — Telephone Encounter (Signed)
Patient unable to get physical therapy until she gets an order from you. She would like a message left on her line once completed.To Dr. Lyndel Safe

## 2019-09-19 NOTE — Telephone Encounter (Signed)
I am Placing order right now

## 2019-09-23 ENCOUNTER — Encounter: Payer: Self-pay | Admitting: Family

## 2019-09-23 ENCOUNTER — Other Ambulatory Visit: Payer: Self-pay

## 2019-09-23 ENCOUNTER — Ambulatory Visit (INDEPENDENT_AMBULATORY_CARE_PROVIDER_SITE_OTHER): Payer: Medicare Other | Admitting: Family

## 2019-09-23 VITALS — BP 130/78 | HR 78 | Temp 98.2°F | Resp 16 | Ht 62.0 in | Wt 141.4 lb

## 2019-09-23 DIAGNOSIS — R42 Dizziness and giddiness: Secondary | ICD-10-CM | POA: Diagnosis not present

## 2019-09-23 DIAGNOSIS — H6121 Impacted cerumen, right ear: Secondary | ICD-10-CM

## 2019-09-23 DIAGNOSIS — H9201 Otalgia, right ear: Secondary | ICD-10-CM

## 2019-09-23 NOTE — Progress Notes (Signed)
Provider: Chares Slaymaker FNP-C  Virgie Dad, MD  Patient Care Team: Virgie Dad, MD as PCP - General (Internal Medicine) Mast, Man X, NP as Nurse Practitioner (Internal Medicine)  Extended Emergency Contact Information Primary Emergency Contact: Augustin Schooling [address] [address] of [place] Mobile Phone: [phone] Relation: Daughter  Code Status: Full Code   Goals of care: Advanced Directive information Advanced Directives 09/23/2019  Does Patient Have a Medical Advance Directive? Yes  Type of Paramedic of [place];Out of facility DNR (pink MOST or yellow form)  Does patient want to make changes to medical advance directive? No - Patient declined  Copy of [place] in Chart? Yes - validated most recent copy scanned in chart (See row information)  Would patient like information on creating a medical advance directive? -  Pre-existing out of facility DNR order (yellow form or pink MOST form) -     Chief Complaint  Patient presents with   Acute Visit    Right ear pain/ Lightheaded/ Hearing Loss.     HPI:  Pt is a 86 y.o. female seen today for an acute visit for evaluation of right ear pain,decreased hearing and light headedness.she is here with her daughter [name].she states was seen by her PCP Dr.Gupta 09/10/2019 for right ear pain was treated with Ciprodex  For 2 weeks.she has completed the ear drops but states pain has worsen,ear feels full with some " whooshing sound" and hearing has decreased.she used Jojoba oil previously states was better than the ciprodex.states usually has right ear pain during pollen season.she denies any runny nose,sinus pressure,congestion,PND,cough or sore throat.Also denies any fever or chills. Also complains of feeling lightheaded and feels wobbly.No changes in vision,headache,palpitation,chest pain or shortness of breath.   Latest Hgb/HCT reviewed stable.   Past Medical  History:  Diagnosis Date   Allergic rhinitis due to pollen    Anxiety state, unspecified    Arthritis    RIGHT LEG, HANDS.   Cancer (HCC)    FACE/NOSE -SKIN-squamous cell and basal cell.   Disorder of bone and cartilage, unspecified    Lumbago    Lyme borreliosis    Other atopic dermatitis and related conditions    Other dysphagia    Reflux esophagitis    Senile osteoporosis    TMJ dysfunction    MORE ON LEFT "WEARS BITE PROTECTION AT NIGHT"   Past Surgical History:  Procedure Laterality Date   APPENDECTOMY  1950   CATARACT EXTRACTION, BILATERAL Bilateral    EYE SURGERY Right    right eye ptrigium excision x2   HERNIA REPAIR Bilateral    TOTAL HIP ARTHROPLASTY Right 12/10/2015   Procedure: RIGHT TOTAL HIP ARTHROPLASTY ANTERIOR APPROACH;  Surgeon: [name], MD;  Location: [facility];  Service: Orthopedics;  Laterality: Right;  Spinal to General    Allergies  Allergen Reactions   Brandy [Alcohol] Anaphylaxis    Related to grapes   Prune Anaphylaxis    PLUMS,GRAPES,RAISINS   Aspirin Other (See Comments)    Stomach bleed   Augmentin [Amoxicillin-Pot Clavulanate] Hives and Itching   Clindamycin Other (See Comments)    unkown   Omnicef [Cefdinir] Hives   Vinegar [Acetic Acid]     basalmic   Alprazolam Rash   Ceftin [Cefuroxime Axetil] Rash   Doxycycline Rash   Eryc [Erythromycin] Rash   Penicillins Rash    Outpatient Encounter Medications as of 09/23/2019  Medication Sig  acetaminophen (TYLENOL) 325 MG tablet Take 650 mg by mouth every 6 (six) hours as needed for mild pain.   atorvastatin (LIPITOR) 10 MG tablet Take 1 tablet (10 mg total) by mouth daily.   cholecalciferol (VITAMIN D) 1000 UNITS tablet Take 1,000 Units by mouth daily.   ciprofloxacin-dexamethasone (CIPRODEX) OTIC suspension Place 4 drops into the right ear 2 (two) times daily.   EPINEPHrine (EPIPEN 2-PAK) 0.3 mg/0.3 mL IJ SOAJ injection Inject 0.3 mLs (0.3 mg  total) into the muscle as needed for anaphylaxis.   gabapentin (NEURONTIN) 300 MG capsule TAKE 1 CAPSULE AT BEDTIME.   GARLIC PO Take 1 tablet by mouth daily.   latanoprost (XALATAN) 0.005 % ophthalmic solution Place 1 drop into both eyes at bedtime.   meloxicam (MOBIC) 7.5 MG tablet Take 1 tablet (7.5 mg total) by mouth daily. Take it with food   senna (SENOKOT) 8.6 MG TABS tablet Take 1 tablet by mouth daily as needed for mild constipation.   No facility-administered encounter medications on file as of 09/23/2019.    Review of Systems  Constitutional: Negative for appetite change, chills, fatigue and fever.  HENT: Positive for ear pain and hearing loss. Negative for congestion, ear discharge, postnasal drip, rhinorrhea, sinus pressure, sinus pain, sneezing, sore throat and tinnitus.        Itchy right ear   Eyes: Negative for pain, discharge, redness, itching and visual disturbance.  Respiratory: Negative for cough, chest tightness, shortness of breath and wheezing.   Cardiovascular: Negative for chest pain, palpitations and leg swelling.  Gastrointestinal: Negative for abdominal distention, abdominal pain, constipation, diarrhea, nausea and vomiting.  Musculoskeletal: Positive for arthralgias and gait problem. Negative for myalgias.  Skin: Negative for color change, pallor and rash.  Neurological: Positive for dizziness, light-headedness and headaches. Negative for syncope, speech difficulty, weakness and numbness.  Hematological: Does not bruise/bleed easily.  Psychiatric/Behavioral: Negative for agitation and sleep disturbance. The patient is not nervous/anxious.     Immunization History  Administered Date(s) Administered   Hepatitis B 05/01/2010, 07/31/2010, 11/30/2010   Influenza Whole 05/01/2010   Influenza, High Dose Seasonal PF 02/07/2017, 02/12/2019   Influenza,inj,Quad PF,6+ Mos 02/06/2013, 01/31/2018   Influenza-Unspecified 02/15/2012, 02/27/2014, 03/02/2015,  02/10/2016   Moderna SARS-COVID-2 Vaccination 05/05/2019, 06/02/2019   PPD Test 04/04/2013   Pneumococcal Conjugate-13 04/06/2014   Pneumococcal Polysaccharide-23 05/01/2010   Td 05/02/2003   Tdap 05/04/2015   Zoster 05/01/2010   Zoster Recombinat (Shingrix) 12/27/2017, 03/14/2018   Pertinent  Health Maintenance Due  Topic Date Due   MAMMOGRAM  09/09/2020 (Originally 03/30/2018)   INFLUENZA VACCINE  11/30/2019   DEXA SCAN  Completed   PNA vac Low Risk Adult  Completed   Fall Risk  09/23/2019 09/10/2019 05/14/2019 04/16/2019 12/03/2018  Falls in the past year? 0 0 0 0 0  Number falls in past yr: 0 0 0 0 0  Injury with Fall? 0 - - - 0  Comment - - - - -    Vitals:   09/23/19 0922  BP: 130/78  Pulse: 78  Resp: 16  Temp: 98.2 F (36.8 C)  SpO2: 98%  Weight: 141 lb 6.4 oz (64.1 kg)  Height: 5\' 2"  (1.575 m)   Body mass index is 25.86 kg/m. Physical Exam Vitals reviewed.  Constitutional:      General: She is not in acute distress.    Appearance: She is overweight. She is not ill-appearing.  HENT:     Head: Normocephalic.     Right Ear: There  is impacted cerumen.     Left Ear: Tympanic membrane, ear canal and external ear normal. There is no impacted cerumen.     Ears:     Comments: Right ear cerumen impacted deep in ear unable to remove.     Nose: Nose normal. No congestion or rhinorrhea.     Mouth/Throat:     Mouth: Mucous membranes are moist.     Pharynx: Oropharynx is clear. No oropharyngeal exudate or posterior oropharyngeal erythema.  Eyes:     General: No scleral icterus.       Right eye: No discharge.        Left eye: No discharge.     Extraocular Movements: Extraocular movements intact.     Conjunctiva/sclera: Conjunctivae normal.     Pupils: Pupils are equal, round, and reactive to light.  Neck:     Vascular: No carotid bruit.  Cardiovascular:     Rate and Rhythm: Normal rate and regular rhythm.     Pulses: Normal pulses.     Heart sounds:  Normal heart sounds. No murmur. No friction rub. No gallop.   Pulmonary:     Effort: Pulmonary effort is normal. No respiratory distress.     Breath sounds: Normal breath sounds. No wheezing, rhonchi or rales.  Chest:     Chest wall: No tenderness.  Abdominal:     General: Bowel sounds are normal. There is no distension.     Palpations: Abdomen is soft. There is no mass.     Tenderness: There is no abdominal tenderness. There is no right CVA tenderness, left CVA tenderness, guarding or rebound.  Musculoskeletal:        General: No swelling or tenderness.     Cervical back: Normal range of motion. No rigidity or tenderness.     Right lower leg: No edema.     Left lower leg: No edema.     Comments: Gait unsteady holding onto her daughter during visit.Ambulates with a walker at home.   Lymphadenopathy:     Cervical: No cervical adenopathy.  Skin:    General: Skin is warm.     Coloration: Skin is not pale.     Findings: No bruising, erythema or rash.  Neurological:     Mental Status: She is alert and oriented to person, place, and time.     Cranial Nerves: No cranial nerve deficit.     Sensory: No sensory deficit.     Motor: No weakness.     Coordination: Coordination normal.     Gait: Gait abnormal.  Psychiatric:        Mood and Affect: Mood normal.        Behavior: Behavior normal.        Thought Content: Thought content normal.    Labs reviewed: Recent Labs    04/08/19 0327 04/08/19 0327 04/09/19 0817 05/08/19 0750 09/04/19 0800  NA 135   < > 134* 140 138  K 3.4*   < > 3.8 4.4 4.3  CL 106   < > 102 107 105  CO2 23   < > 23 22 28   GLUCOSE 100*   < > 102* 91 87  BUN 7*   < > 6* 11 15  CREATININE 0.71   < > 0.70 0.69 0.79  CALCIUM 9.0   < > 9.7 10.1 10.3  MG 2.1  --  2.0  --   --    < > = values in this interval not displayed.  Recent Labs    04/07/19 0455 04/07/19 0455 04/08/19 0327 04/08/19 0327 04/09/19 0817 05/08/19 0750 09/04/19 0800  AST 33   < > 24    < > 24 18 18   ALT 38   < > 31   < > 32 18 13  ALKPHOS 71  --  60  --  70  --   --   BILITOT 0.9   < > 0.6   < > 0.8 0.3 0.4  PROT 5.7*   < > 5.2*   < > 6.1* 6.1 6.3  ALBUMIN 3.0*  --  2.9*  --  3.0*  --   --    < > = values in this interval not displayed.   Recent Labs    04/09/19 0817 05/08/19 0750 09/04/19 0800  WBC 8.4 8.4 7.6  NEUTROABS 5.3 5,116 4,324  HGB 12.6 12.7 13.3  HCT 37.9 36.8 40.6  MCV 102.7* 95.8 91.4  PLT 199 321 256   Lab Results  Component Value Date   TSH 5.19 (H) 05/08/2019   No results found for: HGBA1C Lab Results  Component Value Date   CHOL 235 (H) 09/04/2019   HDL 63 09/04/2019   LDLCALC 147 (H) 09/04/2019   TRIG 126 09/04/2019   CHOLHDL 3.7 09/04/2019    Significant Diagnostic Results in last 30 days:  No results found.  Assessment/Plan 1. Right ear pain Afebrile. Has completed Ciprodex Otic solution as prescribed by PCP Dr.Gupta.  - over the counter Tylenol 650 mg tablet every 6 hours as needed for pain.will place urgent referral to ENT for further evaluation.  - Ambulatory referral to ENT.Made aware that specialist office will call her for appointment.  2. Dizziness Latest Hgb reviewed stable.No hypotension or orthostatic B/p.Suspect dizziness could be related to her ongoing issues with the right ear. - Increase water intake 6-8 glasses of water daily  - Ambulatory referral to ENT  3. Impacted cerumen of right ear Cerumen noted deep in the ear unable to remove this visit.Recommended urgent referral to ENT for further evaluation. - Advised not to use Q-tips for ear wax removal to prevent pushing ear wax further into the ear verbalized understanding.  - Ambulatory referral to ENT  Family/ staff Communication: Reviewed plan of care with patient verbalized understanding.   Labs/tests ordered: None   Next Appointment: As needed if symptoms worsen or fail to improve.   Sandrea Hughs, NP

## 2019-09-23 NOTE — Patient Instructions (Signed)
- Referral ordered for ENT for right ear pain and ear wax specialist office will call you for appointment.  - Increase water intake 6-8 glasses of water daily    Dizziness Dizziness is a common problem. It makes you feel unsteady or light-headed. You may feel like you are about to pass out (faint). Dizziness can lead to getting hurt if you stumble or fall. Dizziness can be caused by many things, including:  Medicines.  Not having enough water in your body (dehydration).  Illness. Follow these instructions at home: Eating and drinking   Drink enough fluid to keep your pee (urine) clear or pale yellow. This helps to keep you from getting dehydrated. Try to drink more clear fluids, such as water.  Do not drink alcohol.  Limit how much caffeine you drink or eat, if your doctor tells you to do that.  Limit how much salt (sodium) you drink or eat, if your doctor tells you to do that. Activity   Avoid making quick movements. ? When you stand up from sitting in a chair, steady yourself until you feel okay. ? In the morning, first sit up on the side of the bed. When you feel okay, stand slowly while you hold onto something. Do this until you know that your balance is fine.  If you need to stand in one place for a long time, move your legs often. Tighten and relax the muscles in your legs while you are standing.  Do not drive or use heavy machinery if you feel dizzy.  Avoid bending down if you feel dizzy. Place items in your home so you can reach them easily without leaning over. Lifestyle  Do not use any products that contain nicotine or tobacco, such as cigarettes and e-cigarettes. If you need help quitting, ask your doctor.  Try to lower your stress level. You can do this by using methods such as yoga or meditation. Talk with your doctor if you need help. General instructions  Watch your dizziness for any changes.  Take over-the-counter and prescription medicines only as told by  your doctor. Talk with your doctor if you think that you are dizzy because of a medicine that you are taking.  Tell a friend or a family member that you are feeling dizzy. If he or she notices any changes in your behavior, have this person call your doctor.  Keep all follow-up visits as told by your doctor. This is important. Contact a doctor if:  Your dizziness does not go away.  Your dizziness or light-headedness gets worse.  You feel sick to your stomach (nauseous).  You have trouble hearing.  You have new symptoms.  You are unsteady on your feet.  You feel like the room is spinning. Get help right away if:  You throw up (vomit) or have watery poop (diarrhea), and you cannot eat or drink anything.  You have trouble: ? Talking. ? Walking. ? Swallowing. ? Using your arms, hands, or legs.  You feel generally weak.  You are not thinking clearly, or you have trouble forming sentences. A friend or family member may notice this.  You have: ? Chest pain. ? Pain in your belly (abdomen). ? Shortness of breath. ? Sweating.  Your vision changes.  You are bleeding.  You have a very bad headache.  You have neck pain or a stiff neck.  You have a fever. These symptoms may be an emergency. Do not wait to see if the symptoms will go  away. Get medical help right away. Call your local emergency services (911 in the [place] Do not drive yourself to the hospital. Summary  Dizziness makes you feel unsteady or light-headed. You may feel like you are about to pass out (faint).  Drink enough fluid to keep your pee (urine) clear or pale yellow. Do not drink alcohol.  Avoid making quick movements if you feel dizzy.  Watch your dizziness for any changes. This information is not intended to replace advice given to you by your health care provider. Make sure you discuss any questions you have with your health care provider. Document Revised: 04/20/2017 Document Reviewed:  05/04/2016 Elsevier Patient Education  [place], Adult An earache, or ear pain, can be caused by many things, including:  An infection.  Ear wax buildup.  Ear pressure.  Something in the ear that should not be there (foreign body).  A sore throat.  Tooth problems.  Jaw problems. Treatment of the earache will depend on the cause. If the cause is not clear or cannot be determined, you may need to watch your symptoms until your earache goes away or until a cause is found. Follow these instructions at home: Medicines  Take or apply over-the-counter and prescription medicines only as told by your health care provider.  If you were prescribed an antibiotic medicine, use it as told by your health care provider. Do not stop using the antibiotic even if you start to feel better.  Do not put anything in your ear other than medicine that is prescribed by your health care provider. Managing pain If directed, apply heat to the affected area as often as told by your health care provider. Use the heat source that your health care provider recommends, such as a moist heat pack or a heating pad.  Place a towel between your skin and the heat source.  Leave the heat on for 20-30 minutes.  Remove the heat if your skin turns bright red. This is especially important if you are unable to feel pain, heat, or cold. You may have a greater risk of getting burned. If directed, put ice on the affected area as often as told by your health care provider. To do this:      Put ice in a plastic bag.  Place a towel between your skin and the bag.  Leave the ice on for 20 minutes, 2-3 times a day. General instructions  Pay attention to any changes in your symptoms.  Try resting in an upright position instead of lying down. This may help to reduce pressure in your ear and relieve pain.  Chew gum if it helps to relieve your ear pain.  Treat any allergies as told by your health care  provider.  Drink enough fluid to keep your urine pale yellow.  It is up to you to get the results of any tests that were done. Ask your health care provider, or the department that is doing the tests, when your results will be ready.  Keep all follow-up visits as told by your health care provider. This is important. Contact a health care provider if:  Your pain does not improve within 2 days.  Your earache gets worse.  You have new symptoms.  You have a fever. Get help right away if you:  Have a severe headache.  Have a stiff neck.  Have trouble swallowing.  Have redness or swelling behind your ear.  Have fluid or blood coming from your ear.  Have hearing loss.  Feel dizzy. Summary  An earache, or ear pain, can be caused by many things.  Treatment of the earache will depend on the cause. Follow recommendations from your health care provider to treat your ear pain.  If the cause is not clear or cannot be determined, you may need to watch your symptoms until your earache goes away or until a cause is found.  Keep all follow-up visits as told by your health care provider. This is important. This information is not intended to replace advice given to you by your health care provider. Make sure you discuss any questions you have with your health care provider. Document Revised: 11/23/2018 Document Reviewed: 11/23/2018 Elsevier Patient Education  [place].

## 2019-09-26 ENCOUNTER — Telehealth: Payer: Self-pay

## 2019-09-26 DIAGNOSIS — H6123 Impacted cerumen, bilateral: Secondary | ICD-10-CM | POA: Diagnosis not present

## 2019-09-26 NOTE — Telephone Encounter (Signed)
Patient daughter "Charlynn Court" states that she had a referral to Ear, Nose, and Throat Doctor from you. However ENT Doctor has taken vacation and wouldn't be able to see her mom. Patient daughter states that she will go to Urgent Care instead to have her mom ears cleaned out. I let patient daughter know that I would inform you on her decision.

## 2019-09-30 ENCOUNTER — Encounter: Payer: Self-pay | Admitting: Internal Medicine

## 2019-10-02 NOTE — Telephone Encounter (Signed)
Message routed to PCP Virgie Dad, MD . Please Advise.

## 2019-10-07 ENCOUNTER — Ambulatory Visit (INDEPENDENT_AMBULATORY_CARE_PROVIDER_SITE_OTHER): Payer: Medicare Other | Admitting: Otolaryngology

## 2019-10-07 ENCOUNTER — Other Ambulatory Visit: Payer: Self-pay

## 2019-10-07 ENCOUNTER — Encounter (INDEPENDENT_AMBULATORY_CARE_PROVIDER_SITE_OTHER): Payer: Self-pay | Admitting: Otolaryngology

## 2019-10-07 VITALS — Temp 97.2°F

## 2019-10-07 DIAGNOSIS — J310 Chronic rhinitis: Secondary | ICD-10-CM

## 2019-10-07 DIAGNOSIS — R42 Dizziness and giddiness: Secondary | ICD-10-CM
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
## 2019-10-09 DIAGNOSIS — M6281 Muscle weakness (generalized): Secondary | ICD-10-CM | POA: Diagnosis not present

## 2019-10-09 DIAGNOSIS — M25511 Pain in right shoulder: Secondary | ICD-10-CM | POA: Diagnosis not present

## 2019-10-09 DIAGNOSIS — M545 Low back pain: Secondary | ICD-10-CM | POA: Diagnosis not present

## 2019-10-14 DIAGNOSIS — M545 Low back pain: Secondary | ICD-10-CM | POA: Diagnosis not present

## 2019-10-14 DIAGNOSIS — M25511 Pain in right shoulder: Secondary | ICD-10-CM | POA: Diagnosis not present

## 2019-10-14 DIAGNOSIS — M6281 Muscle weakness (generalized): Secondary | ICD-10-CM | POA: Diagnosis not present

## 2019-10-15 ENCOUNTER — Non-Acute Institutional Stay: Payer: Medicare Other | Admitting: Internal Medicine

## 2019-10-15 ENCOUNTER — Encounter: Payer: Self-pay | Admitting: Internal Medicine

## 2019-10-15 ENCOUNTER — Other Ambulatory Visit: Payer: Self-pay

## 2019-10-15 VITALS — BP 138/68 | HR 83 | Temp 97.8°F | Ht 62.0 in | Wt 142.0 lb

## 2019-10-15 DIAGNOSIS — F1010 Alcohol abuse, uncomplicated: Secondary | ICD-10-CM | POA: Diagnosis not present

## 2019-10-15 DIAGNOSIS — G8929 Other chronic pain: Secondary | ICD-10-CM

## 2019-10-15 DIAGNOSIS — M25511 Pain in right shoulder: Secondary | ICD-10-CM | POA: Diagnosis not present

## 2019-10-15 DIAGNOSIS — E785 Hyperlipidemia, unspecified: Secondary | ICD-10-CM | POA: Diagnosis not present

## 2019-10-15 DIAGNOSIS — R42 Dizziness and giddiness: Secondary | ICD-10-CM

## 2019-10-15 DIAGNOSIS — R4189 Other symptoms and signs involving cognitive functions and awareness: Secondary | ICD-10-CM

## 2019-10-15 DIAGNOSIS — F5101 Primary insomnia: Secondary | ICD-10-CM

## 2019-10-15 NOTE — Progress Notes (Signed)
Location:     Place of Service:     Provider:   Code Status: Goals of Care:  Advanced Directives 09/23/2019  Does Patient Have a Medical Advance Directive? Yes  Type of Paramedic of [place];Out of facility DNR (pink MOST or yellow form)  Does patient want to make changes to medical advance directive? No - Patient declined  Copy of [place] in Chart? Yes - validated most recent copy scanned in chart (See row information)  Would patient like information on creating a medical advance directive? -  Pre-existing out of facility DNR order (yellow form or pink MOST form) -     Chief Complaint  Patient presents with  . Medical Management of Chronic Issues    Patient returns to the clinic with her daughter [name] for her follow up and have her right ear checked. She recently seen the ENT. They would also like to review her medications. She has not been on the Atorvastatin.     HPI: Patient is a 86 y.o. female seen today for an acute visit for Follow up  Impacted ear Wax removed by urgent care.  And was finally seen by ENT. Dr. Lucia Gaskins thought her dizziness was related to allergies.  And prescribed Nasacort for her. Dizziness Does not look like vertigo.  Patient states that she feels different sometimes when she stands up.  Her dizziness is much improved since her ears have been feeling better. Right shoulder pain Patient has bilateral shoulder pains right more than left.  She is working with therapy. Cognitive impairment Continues to be the issue. Insomnia Not consuming alcohol anymore.  States that Neurontin is really helping her with her sleep Hyperlipidemia She stopped her Lipitor at she and her daughter were not sure if the dizziness was related to that.  Patient was admitted to the Hospital from 12/05-12/09 for GI symptoms due to Excessive Alcohol use and she did go into Alcohol withdrawal in the Hospital Patient lives in [place]. Is  independent in her ADLs and IADLs. Has a daughter who is in town and is Retail banker for another facility Past Medical History:  Diagnosis Date  . Allergic rhinitis due to pollen   . Anxiety state, unspecified   . Arthritis    RIGHT LEG, HANDS.  [name] Cancer (HCC)    FACE/NOSE -SKIN-squamous cell and basal cell.  . Disorder of bone and cartilage, unspecified   . Lumbago   . Lyme borreliosis   . Other atopic dermatitis and related conditions   . Other dysphagia   . Reflux esophagitis   . Senile osteoporosis   . TMJ dysfunction    MORE ON LEFT "WEARS BITE PROTECTION AT NIGHT"    Past Surgical History:  Procedure Laterality Date  . APPENDECTOMY  1950  . CATARACT EXTRACTION, BILATERAL Bilateral   . EYE SURGERY Right    right eye ptrigium excision x2  . HERNIA REPAIR Bilateral   . TOTAL HIP ARTHROPLASTY Right 12/10/2015   Procedure: RIGHT TOTAL HIP ARTHROPLASTY ANTERIOR APPROACH;  Surgeon: [name], MD;  Location: [facility];  Service: Orthopedics;  Laterality: Right;  Spinal to General    Allergies  Allergen Reactions  . Brandy [Alcohol] Anaphylaxis    Related to grapes  . Prune Anaphylaxis    PLUMS,GRAPES,RAISINS  . Aspirin Other (See Comments)    Stomach bleed  . Augmentin [Amoxicillin-Pot Clavulanate] Hives and Itching  . Clindamycin Other (See Comments)    unkown  . Holmes.Ano [  Cefdinir] Hives  . Vinegar [Acetic Acid]     basalmic  . Alprazolam Rash  . Ceftin [Cefuroxime Axetil] Rash  . Doxycycline Rash  . Eryc [Erythromycin] Rash  . Penicillins Rash    Outpatient Encounter Medications as of 10/15/2019  Medication Sig  . acetaminophen (TYLENOL) 325 MG tablet Take 650 mg by mouth every 6 (six) hours as needed for mild pain.  [name] atorvastatin (LIPITOR) 10 MG tablet Take 1 tablet (10 mg total) by mouth daily.  . cholecalciferol (VITAMIN D) 1000 UNITS tablet Take 1,000 Units by mouth daily.  [name] EPINEPHrine (EPIPEN 2-PAK) 0.3 mg/0.3 mL IJ SOAJ injection Inject  0.3 mLs (0.3 mg total) into the muscle as needed for anaphylaxis.  [name] gabapentin (NEURONTIN) 300 MG capsule TAKE 1 CAPSULE AT BEDTIME.  [name] GARLIC PO Take 1 tablet by mouth daily.  [name] latanoprost (XALATAN) 0.005 % ophthalmic solution Place 1 drop into both eyes at bedtime.  . senna (SENOKOT) 8.6 MG TABS tablet Take 1 tablet by mouth daily as needed for mild constipation.  . [DISCONTINUED] ciprofloxacin-dexamethasone (CIPRODEX) OTIC suspension Place 4 drops into the right ear 2 (two) times daily.  . [DISCONTINUED] meloxicam (MOBIC) 7.5 MG tablet Take 1 tablet (7.5 mg total) by mouth daily. Take it with food   No facility-administered encounter medications on file as of 10/15/2019.    Review of Systems:  Review of Systems  Constitutional: Negative.   HENT: Positive for ear pain.   Respiratory: Negative.   Cardiovascular: Negative.   Gastrointestinal: Negative.   Musculoskeletal: Negative.   Skin: Negative.   Neurological: Positive for dizziness.  Psychiatric/Behavioral: Positive for confusion and sleep disturbance.    Health Maintenance  Topic Date Due  . MAMMOGRAM  09/09/2020 (Originally 03/30/2018)  . INFLUENZA VACCINE  11/30/2019  . TETANUS/TDAP  05/03/2025  . DEXA SCAN  Completed  . COVID-19 Vaccine  Completed  . PNA vac Low Risk Adult  Completed    Physical Exam: Vitals:   10/15/19 1329  BP: 138/68  Pulse: 83  Temp: 97.8 F (36.6 C)  SpO2: 98%  Weight: 142 lb (64.4 kg)  Height: 5\' 2"  (1.575 m)   Body mass index is 25.97 kg/m. Physical Exam  Constitutional: Oriented to person, place, and time. Well-developed and well-nourished.  HENT:  Head: Normocephalic.  Mouth/Throat: Oropharynx is clear and moist.  Ears Both ears with clean TM was normal with no wax Eyes: Pupils are equal, round, and reactive to light.  Neck: Neck supple.  Cardiovascular: Normal rate and normal heart sounds.  No murmur heard. Pulmonary/Chest: Effort normal and breath sounds normal. No  respiratory distress. No wheezes. She has no rales.  Abdominal: Soft. Bowel sounds are normal. No distension. There is no tenderness. There is no rebound.  Musculoskeletal: No edema.  Right shoulder Patient complains of pain when I try to raise it above the clavicle. Left shoulder Less pain and more easily moving Lymphadenopathy: none Neurological: Is able to get up from the exam table and walk with a stable gait Skin: Skin is warm and dry.  Psychiatric: Normal mood and affect. Behavior is normal. Thought content normal.    Labs reviewed: Basic Metabolic Panel: Recent Labs    04/08/19 0327 04/08/19 0327 04/09/19 0817 05/08/19 0750 09/04/19 0800  NA 135   < > 134* 140 138  K 3.4*   < > 3.8 4.4 4.3  CL 106   < > 102 107 105  CO2 23   < > 23 22 28  GLUCOSE 100*   < > 102* 91 87  BUN 7*   < > 6* 11 15  CREATININE 0.71   < > 0.70 0.69 0.79  CALCIUM 9.0   < > 9.7 10.1 10.3  MG 2.1  --  2.0  --   --   TSH  --   --   --  5.19*  --    < > = values in this interval not displayed.   Liver Function Tests: Recent Labs    04/07/19 0455 04/07/19 0455 04/08/19 0327 04/08/19 0327 04/09/19 0817 05/08/19 0750 09/04/19 0800  AST 33   < > 24   < > 24 18 18   ALT 38   < > 31   < > 32 18 13  ALKPHOS 71  --  60  --  70  --   --   BILITOT 0.9   < > 0.6   < > 0.8 0.3 0.4  PROT 5.7*   < > 5.2*   < > 6.1* 6.1 6.3  ALBUMIN 3.0*  --  2.9*  --  3.0*  --   --    < > = values in this interval not displayed.   Recent Labs    04/06/19 1429  LIPASE 35   No results for input(s): AMMONIA in the last 8760 hours. CBC: Recent Labs    04/09/19 0817 05/08/19 0750 09/04/19 0800  WBC 8.4 8.4 7.6  NEUTROABS 5.3 5,116 4,324  HGB 12.6 12.7 13.3  HCT 37.9 36.8 40.6  MCV 102.7* 95.8 91.4  PLT 199 321 256   Lipid Panel: Recent Labs    04/06/19 0147 05/08/19 0750 09/04/19 0800  CHOL 240* 225* 235*  HDL 95 48* 63  LDLCALC 137* 154* 147*  TRIG 39 116 126  CHOLHDL 2.5 4.7 3.7   No results  found for: HGBA1C  Procedures since last visit: No results found.  Assessment/Plan Chronic right shoulder pain Most Likely Arthritis Tylenol PRN Therapy Referal to ortho if pain not better  Dizziness Much Improved now D/w daughter to consider going down on the dose of Neurontin if it persists  Hyperlipidemia with target LDL less than 100 Discussed risk and Benefits of Statin Right now her risk is low due to no risk factors including no Family history no HTN or Diabetes Will monitor Patient's daughter want to wait.  She wants me to monitor her lipid. Alcohol abuse Has being doing well without any alcohol Primary insomnia Continue Neurontin If dizziness does not improve will consider lowering the dose Cognitive impairment MMSE 25/30 in 1/21 Missed one recall. Could not Count Backwards Failed Clock Drawing Discussed in detail with the daughter Will repeat MMSE next visit.  If any more drop they are open for Namenda.     Labs/tests ordered:  * No order type specified * Next appt:  12/05/2019

## 2019-10-17 DIAGNOSIS — M6281 Muscle weakness (generalized): Secondary | ICD-10-CM | POA: Diagnosis not present

## 2019-10-17 DIAGNOSIS — M545 Low back pain: Secondary | ICD-10-CM | POA: Diagnosis not present

## 2019-10-17 DIAGNOSIS — M25511 Pain in right shoulder: Secondary | ICD-10-CM | POA: Diagnosis not present

## 2019-10-20 DIAGNOSIS — M545 Low back pain: Secondary | ICD-10-CM | POA: Diagnosis not present

## 2019-10-20 DIAGNOSIS — M25511 Pain in right shoulder: Secondary | ICD-10-CM | POA: Diagnosis not present

## 2019-10-20 DIAGNOSIS — M6281 Muscle weakness (generalized): Secondary | ICD-10-CM | POA: Diagnosis not present

## 2019-10-22 DIAGNOSIS — M6281 Muscle weakness (generalized): Secondary | ICD-10-CM | POA: Diagnosis not present

## 2019-10-22 DIAGNOSIS — M25511 Pain in right shoulder: Secondary | ICD-10-CM | POA: Diagnosis not present

## 2019-10-22 DIAGNOSIS — M545 Low back pain: Secondary | ICD-10-CM | POA: Diagnosis not present

## 2019-10-28 DIAGNOSIS — M6281 Muscle weakness (generalized): Secondary | ICD-10-CM | POA: Diagnosis not present

## 2019-10-28 DIAGNOSIS — M545 Low back pain: Secondary | ICD-10-CM | POA: Diagnosis not present

## 2019-10-28 DIAGNOSIS — M25511 Pain in right shoulder: Secondary | ICD-10-CM | POA: Diagnosis not present

## 2019-10-31 DIAGNOSIS — M6281 Muscle weakness (generalized): Secondary | ICD-10-CM | POA: Diagnosis not present

## 2019-10-31 DIAGNOSIS — M545 Low back pain: Secondary | ICD-10-CM | POA: Diagnosis not present

## 2019-10-31 DIAGNOSIS — M25511 Pain in right shoulder: Secondary | ICD-10-CM | POA: Diagnosis not present

## 2019-11-04 DIAGNOSIS — M6281 Muscle weakness (generalized): Secondary | ICD-10-CM | POA: Diagnosis not present

## 2019-11-04 DIAGNOSIS — M25511 Pain in right shoulder: Secondary | ICD-10-CM | POA: Diagnosis not present

## 2019-11-04 DIAGNOSIS — M545 Low back pain: Secondary | ICD-10-CM | POA: Diagnosis not present

## 2019-11-06 DIAGNOSIS — M545 Low back pain: Secondary | ICD-10-CM | POA: Diagnosis not present

## 2019-11-06 DIAGNOSIS — M6281 Muscle weakness (generalized): Secondary | ICD-10-CM | POA: Diagnosis not present

## 2019-11-06 DIAGNOSIS — M25511 Pain in right shoulder: Secondary | ICD-10-CM | POA: Diagnosis not present

## 2019-11-07 ENCOUNTER — Other Ambulatory Visit: Payer: Self-pay | Admitting: Internal Medicine

## 2019-11-11 DIAGNOSIS — M6281 Muscle weakness (generalized): Secondary | ICD-10-CM | POA: Diagnosis not present

## 2019-11-11 DIAGNOSIS — M545 Low back pain: Secondary | ICD-10-CM | POA: Diagnosis not present

## 2019-11-11 DIAGNOSIS — M25511 Pain in right shoulder: Secondary | ICD-10-CM | POA: Diagnosis not present

## 2019-11-14 DIAGNOSIS — M25511 Pain in right shoulder: Secondary | ICD-10-CM | POA: Diagnosis not present

## 2019-11-14 DIAGNOSIS — M545 Low back pain: Secondary | ICD-10-CM | POA: Diagnosis not present

## 2019-11-14 DIAGNOSIS — M6281 Muscle weakness (generalized): Secondary | ICD-10-CM | POA: Diagnosis not present

## 2019-11-16 ENCOUNTER — Encounter: Payer: Self-pay | Admitting: Internal Medicine

## 2019-11-17 ENCOUNTER — Other Ambulatory Visit: Payer: Self-pay | Admitting: Internal Medicine

## 2019-11-17 DIAGNOSIS — G8929 Other chronic pain: Secondary | ICD-10-CM

## 2019-11-18 NOTE — Telephone Encounter (Signed)
Message routed to Dr.Gupta

## 2019-11-25 DIAGNOSIS — M545 Low back pain: Secondary | ICD-10-CM | POA: Diagnosis not present

## 2019-11-25 DIAGNOSIS — M25511 Pain in right shoulder: Secondary | ICD-10-CM | POA: Diagnosis not present

## 2019-11-25 DIAGNOSIS — M6281 Muscle weakness (generalized): Secondary | ICD-10-CM | POA: Diagnosis not present

## 2019-11-28 DIAGNOSIS — M67911 Unspecified disorder of synovium and tendon, right shoulder: Secondary | ICD-10-CM | POA: Diagnosis not present

## 2019-11-28 DIAGNOSIS — M67912 Unspecified disorder of synovium and tendon, left shoulder: Secondary | ICD-10-CM | POA: Diagnosis not present

## 2019-12-05 ENCOUNTER — Encounter: Payer: Medicare Other | Admitting: Nurse Practitioner

## 2019-12-07 DIAGNOSIS — R21 Rash and other nonspecific skin eruption: Secondary | ICD-10-CM | POA: Diagnosis not present

## 2019-12-08 ENCOUNTER — Encounter: Payer: Self-pay | Admitting: Internal Medicine

## 2019-12-09 ENCOUNTER — Encounter: Payer: Self-pay | Admitting: Nurse Practitioner

## 2019-12-09 ENCOUNTER — Telehealth: Payer: Self-pay

## 2019-12-09 ENCOUNTER — Ambulatory Visit (INDEPENDENT_AMBULATORY_CARE_PROVIDER_SITE_OTHER): Payer: Medicare Other | Admitting: Nurse Practitioner

## 2019-12-09 ENCOUNTER — Other Ambulatory Visit: Payer: Self-pay

## 2019-12-09 DIAGNOSIS — Z0000 Encounter for general adult medical examination without abnormal findings: Secondary | ICD-10-CM | POA: Diagnosis not present

## 2019-12-09 DIAGNOSIS — R41841 Cognitive communication deficit: Secondary | ICD-10-CM | POA: Diagnosis not present

## 2019-12-09 DIAGNOSIS — M25511 Pain in right shoulder: Secondary | ICD-10-CM | POA: Diagnosis not present

## 2019-12-09 DIAGNOSIS — E2839 Other primary ovarian failure: Secondary | ICD-10-CM

## 2019-12-09 DIAGNOSIS — M6281 Muscle weakness (generalized): Secondary | ICD-10-CM | POA: Diagnosis not present

## 2019-12-09 DIAGNOSIS — M545 Low back pain: Secondary | ICD-10-CM | POA: Diagnosis not present

## 2019-12-09 NOTE — Telephone Encounter (Signed)
Ms. Rachel Morris, Rachel Morris are scheduled for a virtual visit with your provider today.    Just as we do with appointments in the office, we must obtain your consent to participate.  Your consent will be active for this visit and any virtual visit you may have with one of our providers in the next 365 days.    If you have a MyChart account, I can also send a copy of this consent to you electronically.  All virtual visits are billed to your insurance company just like a traditional visit in the office.  As this is a virtual visit, video technology does not allow for your provider to perform a traditional examination.  This may limit your provider's ability to fully assess your condition.  If your provider identifies any concerns that need to be evaluated in person or the need to arrange testing such as labs, EKG, etc, we will make arrangements to do so.    Although advances in technology are sophisticated, we cannot ensure that it will always work on either your end or our end.  If the connection with a video visit is poor, we may have to switch to a telephone visit.  With either a video or telephone visit, we are not always able to ensure that we have a secure connection.   I need to obtain your verbal consent now.   Are you willing to proceed with your visit today?   Rachel Morris has provided verbal consent on 12/09/2019 for a virtual visit (video or telephone).   Carroll Kinds, CMA 12/09/2019  9:29 AM

## 2019-12-09 NOTE — Progress Notes (Signed)
This service is provided via telemedicine  No vital signs collected/recorded due to the encounter was a telemedicine visit.   Location of patient (ex: home, work):  Home  Patient consents to a telephone visit:  Yes, see encounter dated 12/09/2019  Location of the provider (ex: office, home):  [place]  Name of any referring provider:  Ladell Heads, MD  Names of all persons participating in the telemedicine service and their role in the encounter:  Sherrie Mustache, Nurse Practitioner, Carroll Kinds, CMA, and patient.   Time spent on call:  10 minutes with medical assistant
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
## 2019-12-09 NOTE — Patient Instructions (Signed)
Ms. Rachel Morris , Thank you for taking time to come for your Medicare Wellness Visit. I appreciate your ongoing commitment to your health goals. Please review the following plan we discussed and let me know if I can assist you in the future.   Screening recommendations/referrals: Colonoscopy aged out Mammogram aged out Bone Density DUE- ordered placed to call [place] [facility] to schedule.  Recommended yearly ophthalmology/optometry visit for glaucoma screening and checkup Recommended yearly dental visit for hygiene and checkup  Vaccinations: Influenza vaccine DUE- to get when available at [facility]  Pneumococcal vaccine -up to date Tdap vaccine up to date Shingles vaccine up to date     Advanced directives: on file.   Conditions/risks identified: advanced age.   Next appointment: 01/28/2020 at 1 year for AWV    Preventive Care 34 Years and Older, Female Preventive care refers to lifestyle choices and visits with your health care provider that can promote health and wellness. What does preventive care include?  A yearly physical exam. This is also called an annual well check.  Dental exams once or twice a year.  Routine eye exams. Ask your health care provider how often you should have your eyes checked.  Personal lifestyle choices, including:  Daily care of your teeth and gums.  Regular physical activity.  Eating a healthy diet.  Avoiding tobacco and drug use.  Limiting alcohol use.  Practicing safe sex.  Taking low-dose aspirin every day.  Taking vitamin and mineral supplements as recommended by your health care provider. What happens during an annual well check? The services and screenings done by your health care provider during your annual well check will depend on your age, overall health, lifestyle risk factors, and family history of disease. Counseling  Your health care provider may ask you questions about your:  Alcohol use.  Tobacco  use.  Drug use.  Emotional well-being.  Home and relationship well-being.  Sexual activity.  Eating habits.  History of falls.  Memory and ability to understand (cognition).  Work and work Statistician.  Reproductive health. Screening  You may have the following tests or measurements:  Height, weight, and BMI.  Blood pressure.  Lipid and cholesterol levels. These may be checked every 5 years, or more frequently if you are over 52 years old.  Skin check.  Lung cancer screening. You may have this screening every year starting at age 20 if you have a 30-pack-year history of smoking and currently smoke or have quit within the past 15 years.  Fecal occult blood test (FOBT) of the stool. You may have this test every year starting at age 66.  Flexible sigmoidoscopy or colonoscopy. You may have a sigmoidoscopy every 5 years or a colonoscopy every 10 years starting at age 21.  Hepatitis C blood test.  Hepatitis B blood test.  Sexually transmitted disease (STD) testing.  Diabetes screening. This is done by checking your blood sugar (glucose) after you have not eaten for a while (fasting). You may have this done every 1-3 years.  Bone density scan. This is done to screen for osteoporosis. You may have this done starting at age 44.  Mammogram. This may be done every 1-2 years. Talk to your health care provider about how often you should have regular mammograms. Talk with your health care provider about your test results, treatment options, and if necessary, the need for more tests. Vaccines  Your health care provider may recommend certain vaccines, such as:  Influenza vaccine. This is  recommended every year.  Tetanus, diphtheria, and acellular pertussis (Tdap, Td) vaccine. You may need a Td booster every 10 years.  Zoster vaccine. You may need this after age 73.  Pneumococcal 13-valent conjugate (PCV13) vaccine. One dose is recommended after age 45.  Pneumococcal  polysaccharide (PPSV23) vaccine. One dose is recommended after age 38. Talk to your health care provider about which screenings and vaccines you need and how often you need them. This information is not intended to replace advice given to you by your health care provider. Make sure you discuss any questions you have with your health care provider. Document Released: 05/14/2015 Document Revised: 01/05/2016 Document Reviewed: 02/16/2015 Elsevier Interactive Patient Education  2017 [employer] Prevention in the Home Falls can cause injuries. They can happen to people of all ages. There are many things you can do to make your home safe and to help prevent falls. What can I do on the outside of my home?  Regularly fix the edges of walkways and driveways and fix any cracks.  Remove anything that might make you trip as you walk through a door, such as a raised step or threshold.  Trim any bushes or trees on the path to your home.  Use bright outdoor lighting.  Clear any walking paths of anything that might make someone trip, such as rocks or tools.  Regularly check to see if handrails are loose or broken. Make sure that both sides of any steps have handrails.  Any raised decks and porches should have guardrails on the edges.  Have any leaves, snow, or ice cleared regularly.  Use sand or salt on walking paths during winter.  Clean up any spills in your garage right away. This includes oil or grease spills. What can I do in the bathroom?  Use night lights.  Install grab bars by the toilet and in the tub and shower. Do not use towel bars as grab bars.  Use non-skid mats or decals in the tub or shower.  If you need to sit down in the shower, use a plastic, non-slip stool.  Keep the floor dry. Clean up any water that spills on the floor as soon as it happens.  Remove soap buildup in the tub or shower regularly.  Attach bath mats securely with double-sided non-slip rug  tape.  Do not have throw rugs and other things on the floor that can make you trip. What can I do in the bedroom?  Use night lights.  Make sure that you have a light by your bed that is easy to reach.  Do not use any sheets or blankets that are too big for your bed. They should not hang down onto the floor.  Have a firm chair that has side arms. You can use this for support while you get dressed.  Do not have throw rugs and other things on the floor that can make you trip. What can I do in the kitchen?  Clean up any spills right away.  Avoid walking on wet floors.  Keep items that you use a lot in easy-to-reach places.  If you need to reach something above you, use a strong step stool that has a grab bar.  Keep electrical cords out of the way.  Do not use floor polish or wax that makes floors slippery. If you must use wax, use non-skid floor wax.  Do not have throw rugs and other things on the floor that can make you trip.  What can I do with my stairs?  Do not leave any items on the stairs.  Make sure that there are handrails on both sides of the stairs and use them. Fix handrails that are broken or loose. Make sure that handrails are as long as the stairways.  Check any carpeting to make sure that it is firmly attached to the stairs. Fix any carpet that is loose or worn.  Avoid having throw rugs at the top or bottom of the stairs. If you do have throw rugs, attach them to the floor with carpet tape.  Make sure that you have a light switch at the top of the stairs and the bottom of the stairs. If you do not have them, ask someone to add them for you. What else can I do to help prevent falls?  Wear shoes that:  Do not have high heels.  Have rubber bottoms.  Are comfortable and fit you well.  Are closed at the toe. Do not wear sandals.  If you use a stepladder:  Make sure that it is fully opened. Do not climb a closed stepladder.  Make sure that both sides of the  stepladder are locked into place.  Ask someone to hold it for you, if possible.  Clearly mark and make sure that you can see:  Any grab bars or handrails.  First and last steps.  Where the edge of each step is.  Use tools that help you move around (mobility aids) if they are needed. These include:  Canes.  Walkers.  Scooters.  Crutches.  Turn on the lights when you go into a dark area. Replace any light bulbs as soon as they burn out.  Set up your furniture so you have a clear path. Avoid moving your furniture around.  If any of your floors are uneven, fix them.  If there are any pets around you, be aware of where they are.  Review your medicines with your doctor. Some medicines can make you feel dizzy. This can increase your chance of falling. Ask your doctor what other things that you can do to help prevent falls. This information is not intended to replace advice given to you by your health care provider. Make sure you discuss any questions you have with your health care provider. Document Released: 02/11/2009 Document Revised: 09/23/2015 Document Reviewed: 05/22/2014 Elsevier Interactive Patient Education  2017 [employer].

## 2019-12-10 ENCOUNTER — Other Ambulatory Visit: Payer: Self-pay | Admitting: Internal Medicine

## 2019-12-10 DIAGNOSIS — M545 Low back pain: Secondary | ICD-10-CM | POA: Diagnosis not present

## 2019-12-10 DIAGNOSIS — R41841 Cognitive communication deficit: Secondary | ICD-10-CM | POA: Diagnosis not present

## 2019-12-10 DIAGNOSIS — M25511 Pain in right shoulder: Secondary | ICD-10-CM | POA: Diagnosis not present

## 2019-12-10 DIAGNOSIS — M6281 Muscle weakness (generalized): Secondary | ICD-10-CM | POA: Diagnosis not present

## 2019-12-11 DIAGNOSIS — M6281 Muscle weakness (generalized): Secondary | ICD-10-CM | POA: Diagnosis not present

## 2019-12-11 DIAGNOSIS — R41841 Cognitive communication deficit: Secondary | ICD-10-CM | POA: Diagnosis not present

## 2019-12-11 DIAGNOSIS — M25511 Pain in right shoulder: Secondary | ICD-10-CM | POA: Diagnosis not present

## 2019-12-11 DIAGNOSIS — M545 Low back pain: Secondary | ICD-10-CM | POA: Diagnosis not present

## 2019-12-15 NOTE — Telephone Encounter (Signed)
Message Routed to Dr.Gupta MD

## 2019-12-16 DIAGNOSIS — M25511 Pain in right shoulder: Secondary | ICD-10-CM | POA: Diagnosis not present

## 2019-12-16 DIAGNOSIS — R41841 Cognitive communication deficit: Secondary | ICD-10-CM | POA: Diagnosis not present

## 2019-12-16 DIAGNOSIS — M545 Low back pain: Secondary | ICD-10-CM | POA: Diagnosis not present

## 2019-12-16 DIAGNOSIS — M6281 Muscle weakness (generalized): Secondary | ICD-10-CM | POA: Diagnosis not present

## 2019-12-19 DIAGNOSIS — M6281 Muscle weakness (generalized): Secondary | ICD-10-CM | POA: Diagnosis not present

## 2019-12-19 DIAGNOSIS — R41841 Cognitive communication deficit: Secondary | ICD-10-CM | POA: Diagnosis not present

## 2019-12-19 DIAGNOSIS — M25511 Pain in right shoulder: Secondary | ICD-10-CM | POA: Diagnosis not present

## 2019-12-19 DIAGNOSIS — M545 Low back pain: Secondary | ICD-10-CM | POA: Diagnosis not present

## 2020-01-07 ENCOUNTER — Encounter: Payer: Self-pay | Admitting: Internal Medicine

## 2020-01-09 ENCOUNTER — Other Ambulatory Visit: Payer: Self-pay | Admitting: Internal Medicine

## 2020-01-12 ENCOUNTER — Other Ambulatory Visit: Payer: Self-pay | Admitting: Internal Medicine

## 2020-01-12 NOTE — Telephone Encounter (Signed)
Will send to Virgie Dad, MD to advise if ok to refill for medications such as this are usually temporary

## 2020-01-13 ENCOUNTER — Other Ambulatory Visit: Payer: Self-pay | Admitting: *Deleted

## 2020-01-13 MED ORDER — TRIAMCINOLONE ACETONIDE 0.1 % EX OINT: TOPICAL_OINTMENT | CUTANEOUS | 0 refills | Status: DC

## 2020-01-13 NOTE — Telephone Encounter (Signed)
Patient is requesting a refill on Triamcinolone Ointment.  Pended and sent to Dr. Lyndel Safe for approval.

## 2020-01-14 ENCOUNTER — Encounter: Payer: Self-pay | Admitting: Internal Medicine

## 2020-01-20 DIAGNOSIS — H04123 Dry eye syndrome of bilateral lacrimal glands: Secondary | ICD-10-CM | POA: Diagnosis not present

## 2020-01-20 DIAGNOSIS — Z961 Presence of intraocular lens: Secondary | ICD-10-CM | POA: Diagnosis not present

## 2020-01-20 DIAGNOSIS — H40053 Ocular hypertension, bilateral: Secondary | ICD-10-CM | POA: Diagnosis not present

## 2020-01-28 ENCOUNTER — Other Ambulatory Visit: Payer: Self-pay

## 2020-01-29 ENCOUNTER — Other Ambulatory Visit: Payer: Self-pay

## 2020-01-29 ENCOUNTER — Other Ambulatory Visit: Payer: Medicare Other

## 2020-01-29 LAB — CBC WITH DIFFERENTIAL/PLATELET
Absolute Monocytes: 765 cells/uL (ref 200–950)
Basophils Absolute: 68 cells/uL (ref 0–200)
Basophils Relative: 0.8 %
Eosinophils Absolute: 272 cells/uL (ref 15–500)
Eosinophils Relative: 3.2 %
HCT: 40.4 % (ref 35.0–45.0)
Hemoglobin: 13 g/dL (ref 11.7–15.5)
Lymphs Abs: 2346 cells/uL (ref 850–3900)
MCH: 30 pg (ref 27.0–33.0)
MCHC: 32.2 g/dL (ref 32.0–36.0)
MCV: 93.3 fL (ref 80.0–100.0)
MPV: 11.8 fL (ref 7.5–12.5)
Monocytes Relative: 9 %
Neutro Abs: 5049 cells/uL (ref 1500–7800)
Neutrophils Relative %: 59.4 %
Platelets: 265 10*3/uL (ref 140–400)
RBC: 4.33 10*6/uL (ref 3.80–5.10)
RDW: 12.8 % (ref 11.0–15.0)
Total Lymphocyte: 27.6 %
WBC: 8.5 10*3/uL (ref 3.8–10.8)

## 2020-01-29 LAB — LIPID PANEL
Cholesterol: 238 mg/dL — ABNORMAL HIGH (ref <200)
HDL: 64 mg/dL (ref >=50)
LDL Cholesterol (Calc): 150 mg/dL (calc) — ABNORMAL HIGH
Non-HDL Cholesterol (Calc): 174 mg/dL (calc) — ABNORMAL HIGH (ref <130)
Total CHOL/HDL Ratio: 3.7 (calc) (ref <5.0)
Triglycerides: 122 mg/dL (ref <150)

## 2020-01-29 LAB — COMPLETE METABOLIC PANEL WITH GFR
AG Ratio: 1.7 (calc) (ref 1.0–2.5)
ALT: 10 U/L (ref 6–29)
AST: 15 U/L (ref 10–35)
Albumin: 4 g/dL (ref 3.6–5.1)
Alkaline phosphatase (APISO): 94 U/L (ref 37–153)
BUN: 22 mg/dL (ref 7–25)
CO2: 21 mmol/L (ref 20–32)
Calcium: 10 mg/dL (ref 8.6–10.4)
Chloride: 108 mmol/L (ref 98–110)
Creat: 0.81 mg/dL (ref 0.60–0.88)
GFR, Est African American: 76 mL/min/{1.73_m2} (ref >=60)
GFR, Est Non African American: 65 mL/min/{1.73_m2} (ref >=60)
Globulin: 2.3 g/dL (calc) (ref 1.9–3.7)
Glucose, Bld: 82 mg/dL (ref 65–99)
Potassium: 4.5 mmol/L (ref 3.5–5.3)
Sodium: 139 mmol/L (ref 135–146)
Total Bilirubin: 0.4 mg/dL (ref 0.2–1.2)
Total Protein: 6.3 g/dL (ref 6.1–8.1)

## 2020-02-04 ENCOUNTER — Other Ambulatory Visit: Payer: Self-pay

## 2020-02-04 ENCOUNTER — Non-Acute Institutional Stay: Payer: Medicare Other | Admitting: Internal Medicine

## 2020-02-04 ENCOUNTER — Encounter: Payer: Self-pay | Admitting: Internal Medicine

## 2020-02-04 VITALS — BP 116/68 | HR 88 | Temp 96.2°F | Ht 62.0 in | Wt 147.0 lb

## 2020-02-04 DIAGNOSIS — R42 Dizziness and giddiness: Secondary | ICD-10-CM

## 2020-02-04 DIAGNOSIS — E785 Hyperlipidemia, unspecified: Secondary | ICD-10-CM

## 2020-02-04 DIAGNOSIS — R4189 Other symptoms and signs involving cognitive functions and awareness: Secondary | ICD-10-CM

## 2020-02-04 DIAGNOSIS — M25512 Pain in left shoulder: Secondary | ICD-10-CM

## 2020-02-04 DIAGNOSIS — F5101 Primary insomnia: Secondary | ICD-10-CM

## 2020-02-04 DIAGNOSIS — M25511 Pain in right shoulder: Secondary | ICD-10-CM

## 2020-02-04 DIAGNOSIS — G8929 Other chronic pain: Secondary | ICD-10-CM | POA: Diagnosis not present

## 2020-02-04 MED ORDER — GABAPENTIN 100 MG PO CAPS: 200.0000 mg | ORAL_CAPSULE | Freq: Every day | ORAL | 2 refills | Status: DC

## 2020-02-04 NOTE — Progress Notes (Addendum)
Location:  [place] of Service:  Clinic (12)  Provider:   Code Status:  Goals of Care:  Advanced Directives 02/04/2020  Does Patient Have a Medical Advance Directive? Yes  Type of Advance Directive Out of facility DNR (pink MOST or yellow form);Healthcare Power of Attorney  Does patient want to make changes to medical advance directive? No - Patient declined  Copy of [place] in Chart? Yes - validated most recent copy scanned in chart (See row information)  Would patient like information on creating a medical advance directive? -  Pre-existing out of facility DNR order (yellow form or pink MOST form) Yellow form placed in chart (order not valid for inpatient use)     Chief Complaint  Patient presents with  . Medical Management of Chronic Issues    Patient returns to the clinic for her 4 month follow up.     HPI: Patient is a 86 y.o. female seen today for medical management of chronic diseases.    Patient has history ofhyperlipidemia, arthritis status post right hip arthroplasty, essential tremors,anxiety and constipation H/o Alcohol Abuse. Quit since 12/20 Also Bilateral Shoulder Pain  Patient lives in [place]. Is independent in her ADLs and IADLs. Has a daughter who is in town and is Retail banker for another facility  Was seen by Ortho and told that she has arthritis. But she refused Steroid shot and wants to know if she would benefit with it. Continues to have issue with sometimes not able to keep her thoughts together/ Fogginess/ Lightheadedness Also is having Memory issues   Past Medical History:  Diagnosis Date  . Allergic rhinitis due to pollen   . Anxiety state, unspecified   . Arthritis    RIGHT LEG, HANDS.  [name] Cancer (HCC)    FACE/NOSE -SKIN-squamous cell and basal cell.  . Disorder of bone and cartilage, unspecified   . Lumbago   . Lyme borreliosis   . Other atopic dermatitis and related conditions   . Other  dysphagia   . Reflux esophagitis   . Senile osteoporosis   . TMJ dysfunction    MORE ON LEFT "WEARS BITE PROTECTION AT NIGHT"    Past Surgical History:  Procedure Laterality Date  . APPENDECTOMY  1950  . CATARACT EXTRACTION, BILATERAL Bilateral   . EYE SURGERY Right    right eye ptrigium excision x2  . HERNIA REPAIR Bilateral   . TOTAL HIP ARTHROPLASTY Right 12/10/2015   Procedure: RIGHT TOTAL HIP ARTHROPLASTY ANTERIOR APPROACH;  Surgeon: [name], MD;  Location: [facility];  Service: Orthopedics;  Laterality: Right;  Spinal to General    Allergies  Allergen Reactions  . Brandy [Alcohol] Anaphylaxis    Related to grapes  . Prune Anaphylaxis    PLUMS,GRAPES,RAISINS  . Aspirin Other (See Comments)    Stomach bleed  . Augmentin [Amoxicillin-Pot Clavulanate] Hives and Itching  . Clindamycin Other (See Comments)    unkown  . Omnicef [Cefdinir] Hives  . Vinegar [Acetic Acid]     basalmic  . Alprazolam Rash  . Ceftin [Cefuroxime Axetil] Rash  . Doxycycline Rash  . Eryc [Erythromycin] Rash  . Penicillins Rash    Outpatient Encounter Medications as of 02/04/2020  Medication Sig  . acetaminophen (TYLENOL) 325 MG tablet Take 650 mg by mouth every 6 (six) hours as needed for mild pain.  . cholecalciferol (VITAMIN D) 1000 UNITS tablet Take 1,000 Units by mouth daily.  [name] EPINEPHrine (EPIPEN 2-PAK) 0.3 mg/0.3  mL IJ SOAJ injection Inject 0.3 mLs (0.3 mg total) into the muscle as needed for anaphylaxis.  [name] gabapentin (NEURONTIN) 300 MG capsule TAKE 1 CAPSULE AT BEDTIME.  [name] GARLIC PO Take 1 tablet by mouth daily.  [name] latanoprost (XALATAN) 0.005 % ophthalmic solution Place 1 drop into both eyes at bedtime.  . senna (SENOKOT) 8.6 MG TABS tablet Take 1 tablet by mouth daily as needed for mild constipation.  . [DISCONTINUED] triamcinolone ointment (KENALOG) 0.1 % Apply to affected areas twice daily as directed for 7 days.   No facility-administered encounter medications on file as  of 02/04/2020.    Review of Systems:  Review of Systems  Review of Systems  Constitutional: Negative for activity change, appetite change, chills, diaphoresis, fatigue and fever.  HENT: Negative for mouth sores, postnasal drip, rhinorrhea, sinus pain and sore throat.   Respiratory: Negative for apnea, cough, chest tightness, shortness of breath and wheezing.   Cardiovascular: Negative for chest pain, palpitations and leg swelling.  Gastrointestinal: Negative for abdominal distention, abdominal pain, constipation, diarrhea, nausea and vomiting.  Genitourinary: Negative for dysuria and frequency.  Musculoskeletal: Negative for arthralgias, joint swelling and myalgias.  Skin: Negative for rash.  Neurological: Negative for dizziness, syncope, weakness,  and numbness.  Psychiatric/Behavioral: Negative for behavioral problems, confusion and sleep disturbance.     Health Maintenance  Topic Date Due  . TETANUS/TDAP  05/03/2025  . INFLUENZA VACCINE  Completed  . DEXA SCAN  Completed  . COVID-19 Vaccine  Completed  . PNA vac Low Risk Adult  Completed    Physical Exam: Vitals:   02/04/20 1402  BP: 116/68  Pulse: 88  Temp: (!) 96.2 F (35.7 C)  SpO2: 98%  Weight: 147 lb (66.7 kg)  Height: 5\' 2"  (1.575 m)   Body mass index is 26.89 kg/m. Physical Exam Vitals reviewed.  Constitutional:      Appearance: Normal appearance.  HENT:     Head: Normocephalic.     Nose: Nose normal.     Mouth/Throat:     Mouth: Mucous membranes are moist.     Pharynx: Oropharynx is clear.  Eyes:     Pupils: Pupils are equal, round, and reactive to light.  Cardiovascular:     Rate and Rhythm: Normal rate and regular rhythm.     Pulses: Normal pulses.  Pulmonary:     Effort: Pulmonary effort is normal.     Breath sounds: Normal breath sounds.  Abdominal:     General: Abdomen is flat. Bowel sounds are normal.     Palpations: Abdomen is soft.  Musculoskeletal:        General: No swelling.      Cervical back: Neck supple.     Comments: Right shoulder did well with raising above  Left one was more stiff and Painful with movement  Skin:    General: Skin is warm and dry.  Neurological:     General: No focal deficit present.     Mental Status: She is alert.  Psychiatric:        Mood and Affect: Mood normal.        Thought Content: Thought content normal.     Labs reviewed: Basic Metabolic Panel: Recent Labs    04/08/19 0327 04/08/19 0327 04/09/19 0817 04/09/19 0817 05/08/19 0750 09/04/19 0800 01/29/20 0838  NA 135   < > 134*   < > 140 138 139  K 3.4*   < > 3.8   < > 4.4 4.3 4.5  CL 106   < > 102   < > 107 105 108  CO2 23   < > 23   < > 22 28 21   GLUCOSE 100*   < > 102*   < > 91 87 82  BUN 7*   < > 6*   < > 11 15 22   CREATININE 0.71   < > 0.70  --  0.69 0.79 0.81  CALCIUM 9.0   < > 9.7   < > 10.1 10.3 10.0  MG 2.1  --  2.0  --   --   --   --   TSH  --   --   --   --  5.19*  --   --    < > = values in this interval not displayed.   Liver Function Tests: Recent Labs    04/07/19 0455 04/07/19 0455 04/08/19 0327 04/08/19 0327 04/09/19 0817 04/09/19 0817 05/08/19 0750 09/04/19 0800 01/29/20 0838  AST 33   < > 24   < > 24   < > 18 18 15   ALT 38   < > 31   < > 32   < > 18 13 10   ALKPHOS 71  --  60  --  70  --   --   --   --   BILITOT 0.9   < > 0.6   < > 0.8   < > 0.3 0.4 0.4  PROT 5.7*   < > 5.2*   < > 6.1*   < > 6.1 6.3 6.3  ALBUMIN 3.0*  --  2.9*  --  3.0*  --   --   --   --    < > = values in this interval not displayed.   Recent Labs    04/06/19 1429  LIPASE 35   No results for input(s): AMMONIA in the last 8760 hours. CBC: Recent Labs    05/08/19 0750 09/04/19 0800 01/29/20 0838  WBC 8.4 7.6 8.5  NEUTROABS 5,116 4,324 5,049  HGB 12.7 13.3 13.0  HCT 36.8 40.6 40.4  MCV 95.8 91.4 93.3  PLT 321 256 265   Lipid Panel: Recent Labs    05/08/19 0750 09/04/19 0800 01/29/20 0838  CHOL 225* 235* 238*  HDL 48* 63 64  LDLCALC 154* 147* 150*    TRIG 116 126 122  CHOLHDL 4.7 3.7 3.7   No results found for: HGBA1C  Procedures since last visit: No results found.  Assessment/Plan Chronic pain of both shoulders Saw ortho Was told it is arthritis Refused Steroid Injection. But now wants to do it So will call their office and Set up the Appointment Will try Therapy after her Steroid Injection for Max Benefit Light headedness Still feeling like she cannot control her thoughts sometimes. No Dizziness Will decrease the dose of Neurontin to 200 mg QHS If no change in symptoms then will need to see Neurology for possible further work up Hyperlipidemia  Refusing Statins Low risk including no Family history no HTN or Diabetes  Primary insomnia Doing  Well with Neurontin  Cognitive impairment MMSE 25/30 in 1/21 Missed one recall. Could not Count Backwards Failed Clock Drawing Continue Reading and other Exercise Repeat MMSE in next visit and consider Namenda  H/o Alcohal Abuse Says get cravings sometimes but mostly doing well without Constipation Using Miralax PRN  DEXA was ordered By Janett Billow in the office  Labs/tests ordered:  * No order type specified * Next appt:  Visit date not found

## 2020-02-19 DIAGNOSIS — L820 Inflamed seborrheic keratosis: Secondary | ICD-10-CM | POA: Diagnosis not present

## 2020-02-19 DIAGNOSIS — D485 Neoplasm of uncertain behavior of skin: Secondary | ICD-10-CM | POA: Diagnosis not present

## 2020-02-25 DIAGNOSIS — M67912 Unspecified disorder of synovium and tendon, left shoulder: Secondary | ICD-10-CM | POA: Diagnosis not present

## 2020-02-25 DIAGNOSIS — M67911 Unspecified disorder of synovium and tendon, right shoulder: Secondary | ICD-10-CM | POA: Diagnosis not present

## 2020-02-27 DIAGNOSIS — M25511 Pain in right shoulder: Secondary | ICD-10-CM | POA: Diagnosis not present

## 2020-02-27 DIAGNOSIS — M1990 Unspecified osteoarthritis, unspecified site: Secondary | ICD-10-CM | POA: Diagnosis not present

## 2020-02-27 DIAGNOSIS — M6281 Muscle weakness (generalized): Secondary | ICD-10-CM | POA: Diagnosis not present

## 2020-02-27 DIAGNOSIS — M25512 Pain in left shoulder: Secondary | ICD-10-CM | POA: Diagnosis not present

## 2020-03-03 DIAGNOSIS — M25511 Pain in right shoulder: Secondary | ICD-10-CM | POA: Diagnosis not present

## 2020-03-03 DIAGNOSIS — M1990 Unspecified osteoarthritis, unspecified site: Secondary | ICD-10-CM | POA: Diagnosis not present

## 2020-03-03 DIAGNOSIS — M6281 Muscle weakness (generalized): Secondary | ICD-10-CM | POA: Diagnosis not present

## 2020-03-03 DIAGNOSIS — M25512 Pain in left shoulder: Secondary | ICD-10-CM | POA: Diagnosis not present

## 2020-03-05 DIAGNOSIS — M6281 Muscle weakness (generalized): Secondary | ICD-10-CM | POA: Diagnosis not present

## 2020-03-05 DIAGNOSIS — M25512 Pain in left shoulder: Secondary | ICD-10-CM | POA: Diagnosis not present

## 2020-03-05 DIAGNOSIS — M1990 Unspecified osteoarthritis, unspecified site: Secondary | ICD-10-CM | POA: Diagnosis not present

## 2020-03-05 DIAGNOSIS — M25511 Pain in right shoulder: Secondary | ICD-10-CM | POA: Diagnosis not present

## 2020-03-10 DIAGNOSIS — M25512 Pain in left shoulder: Secondary | ICD-10-CM | POA: Diagnosis not present

## 2020-03-10 DIAGNOSIS — M1990 Unspecified osteoarthritis, unspecified site: Secondary | ICD-10-CM | POA: Diagnosis not present

## 2020-03-10 DIAGNOSIS — M6281 Muscle weakness (generalized): Secondary | ICD-10-CM | POA: Diagnosis not present

## 2020-03-10 DIAGNOSIS — M25511 Pain in right shoulder: Secondary | ICD-10-CM | POA: Diagnosis not present

## 2020-03-12 DIAGNOSIS — M1990 Unspecified osteoarthritis, unspecified site: Secondary | ICD-10-CM | POA: Diagnosis not present

## 2020-03-12 DIAGNOSIS — M25512 Pain in left shoulder: Secondary | ICD-10-CM | POA: Diagnosis not present

## 2020-03-12 DIAGNOSIS — M25511 Pain in right shoulder: Secondary | ICD-10-CM | POA: Diagnosis not present

## 2020-03-12 DIAGNOSIS — M6281 Muscle weakness (generalized): Secondary | ICD-10-CM | POA: Diagnosis not present

## 2020-03-15 DIAGNOSIS — Z23 Encounter for immunization: Secondary | ICD-10-CM | POA: Diagnosis not present

## 2020-03-18 DIAGNOSIS — M6281 Muscle weakness (generalized): Secondary | ICD-10-CM | POA: Diagnosis not present

## 2020-03-18 DIAGNOSIS — M25511 Pain in right shoulder: Secondary | ICD-10-CM | POA: Diagnosis not present

## 2020-03-18 DIAGNOSIS — M25512 Pain in left shoulder: Secondary | ICD-10-CM | POA: Diagnosis not present

## 2020-03-18 DIAGNOSIS — M1990 Unspecified osteoarthritis, unspecified site: Secondary | ICD-10-CM | POA: Diagnosis not present

## 2020-03-22 DIAGNOSIS — M25512 Pain in left shoulder: Secondary | ICD-10-CM | POA: Diagnosis not present

## 2020-03-22 DIAGNOSIS — M6281 Muscle weakness (generalized): Secondary | ICD-10-CM | POA: Diagnosis not present

## 2020-03-22 DIAGNOSIS — M1990 Unspecified osteoarthritis, unspecified site: Secondary | ICD-10-CM | POA: Diagnosis not present

## 2020-03-22 DIAGNOSIS — M25511 Pain in right shoulder: Secondary | ICD-10-CM | POA: Diagnosis not present

## 2020-03-23 DIAGNOSIS — M6281 Muscle weakness (generalized): Secondary | ICD-10-CM | POA: Diagnosis not present

## 2020-03-23 DIAGNOSIS — M1990 Unspecified osteoarthritis, unspecified site: Secondary | ICD-10-CM | POA: Diagnosis not present

## 2020-03-23 DIAGNOSIS — M25511 Pain in right shoulder: Secondary | ICD-10-CM | POA: Diagnosis not present

## 2020-03-23 DIAGNOSIS — M25512 Pain in left shoulder: Secondary | ICD-10-CM | POA: Diagnosis not present

## 2020-03-29 DIAGNOSIS — M25511 Pain in right shoulder: Secondary | ICD-10-CM | POA: Diagnosis not present

## 2020-03-29 DIAGNOSIS — M6281 Muscle weakness (generalized): Secondary | ICD-10-CM | POA: Diagnosis not present

## 2020-03-29 DIAGNOSIS — M25512 Pain in left shoulder: Secondary | ICD-10-CM | POA: Diagnosis not present

## 2020-03-29 DIAGNOSIS — M1990 Unspecified osteoarthritis, unspecified site: Secondary | ICD-10-CM | POA: Diagnosis not present

## 2020-03-30 ENCOUNTER — Other Ambulatory Visit: Payer: Self-pay

## 2020-03-30 ENCOUNTER — Encounter: Payer: Self-pay | Admitting: Nurse Practitioner

## 2020-03-30 ENCOUNTER — Ambulatory Visit (INDEPENDENT_AMBULATORY_CARE_PROVIDER_SITE_OTHER): Payer: Medicare Other | Admitting: Nurse Practitioner

## 2020-03-30 VITALS — BP 124/70 | Ht 62.5 in | Wt 147.0 lb

## 2020-03-30 DIAGNOSIS — Z9189 Other specified personal risk factors, not elsewhere classified: Secondary | ICD-10-CM

## 2020-03-30 DIAGNOSIS — Z01419 Encounter for gynecological examination (general) (routine) without abnormal findings: Secondary | ICD-10-CM | POA: Diagnosis not present

## 2020-03-30 DIAGNOSIS — N8110 Cystocele, unspecified: Secondary | ICD-10-CM

## 2020-03-30 DIAGNOSIS — M810 Age-related osteoporosis without current pathological fracture: Secondary | ICD-10-CM

## 2020-03-30 NOTE — Progress Notes (Signed)
   Rachel Morris 05-Jun-1930 [id number]   History:  86 y.o. G2P2 presents for breast and pelvic exam. Postmenopausal - no HRT, no bleeding. Uses pessary with ring support for cystocele with good relief. She removed, cleaned, and reinserted with ease 2 weeks ago. Denies vaginal irritation or discharge. Normal pap and mammogram history. Osteoporosis-declines treatment and repeat screenings.  Gynecologic History No LMP recorded. Patient is postmenopausal.   Last Pap: No longer screening per guidelines Last mammogram: No longer screening per guidelines Last colonoscopy: No longer screening per guidelines Last Dexa: 2017. Results were: t-score -2.6 of left hip  Past medical history, past surgical history, family history and social history were all reviewed and documented in the EPIC chart.  ROS:  A ROS was performed and pertinent positives and negatives are included.  Exam:  Vitals:   03/30/20 0841  BP: 124/70  Weight: 147 lb (66.7 kg)  Height: 5' 2.5" (1.588 m)   Body mass index is 26.46 kg/m.  General appearance:  Normal Thyroid:  Symmetrical, normal in size, without palpable masses or nodularity. Respiratory  Auscultation:  Clear without wheezing or rhonchi Cardiovascular  Auscultation:  Regular rate, without rubs, murmurs or gallops  Edema/varicosities:  Not grossly evident Abdominal  Soft,nontender, without masses, guarding or rebound.  Liver/spleen:  No organomegaly noted  Hernia:  None appreciated  Skin  Inspection:  Grossly normal   Breasts: Examined lying and sitting.   Right: Without masses, retractions, discharge or axillary adenopathy.   Left: Without masses, retractions, discharge or axillary adenopathy. Gentitourinary   Inguinal/mons:  Normal without inguinal adenopathy  External genitalia:  Normal  BUS/Urethra/Skene's glands:  Normal  Vagina:  Atrophic changes, pessary in place  Cervix:  Did not visualize due to pessary  Uterus: Normal in size, shape  and contour.  Midline and mobile  Adnexa/parametria:     Rt: Without masses or tenderness.   Lt: Without masses or tenderness.  Anus and perineum: Normal, non-bleeding hemorrhoids  Digital rectal exam: Normal sphincter tone without palpated masses or tenderness  Assessment/Plan:  86 y.o. G2P2 for breast and pelvic exam.   Well female exam with routine gynecological exam - Education provided on SBEs, importance of preventative screenings, current guidelines, high calcium diet, regular exercise, and multivitamin daily. Labs with PCP.   Female cystocele - Pessary with ring support in place. She has good relief with this. She cleans and replaces every 2-3 months. Denies vaginal irritation or discharge.   Senile osteoporosis - declines treatment or repeat screenings at this time.   Screening for cervical cancer - Normal pap history. No longer screening per guidelines.   Screening for breast cancer - Normal mammogram history. No longer screening per guidelines. Normal breast exam today.   Screening for colon cancer - No longer screening per guidelines.   Follow up in 2 years for CE.     Tamela Gammon [facility], 9:32 AM 03/30/2020

## 2020-03-30 NOTE — Patient Instructions (Signed)
Health Maintenance After Age 84 After age 84, you are at a higher risk for certain long-term diseases and infections as well as injuries from falls. Falls are a major cause of broken bones and head injuries in people who are older than age 84. Getting regular preventive care can help to keep you healthy and well. Preventive care includes getting regular testing and making lifestyle changes as recommended by your health care provider. Talk with your health care provider about:  Which screenings and tests you should have. A screening is a test that checks for a disease when you have no symptoms.  A diet and exercise plan that is right for you. What should I know about screenings and tests to prevent falls? Screening and testing are the best ways to find a health problem early. Early diagnosis and treatment give you the best chance of managing medical conditions that are common after age 84. Certain conditions and lifestyle choices may make you more likely to have a fall. Your health care provider may recommend:  Regular vision checks. Poor vision and conditions such as cataracts can make you more likely to have a fall. If you wear glasses, make sure to get your prescription updated if your vision changes.  Medicine review. Work with your health care provider to regularly review all of the medicines you are taking, including over-the-counter medicines. Ask your health care provider about any side effects that may make you more likely to have a fall. Tell your health care provider if any medicines that you take make you feel dizzy or sleepy.  Osteoporosis screening. Osteoporosis is a condition that causes the bones to get weaker. This can make the bones weak and cause them to break more easily.  Blood pressure screening. Blood pressure changes and medicines to control blood pressure can make you feel dizzy.  Strength and balance checks. Your health care provider may recommend certain tests to check your  strength and balance while standing, walking, or changing positions.  Foot health exam. Foot pain and numbness, as well as not wearing proper footwear, can make you more likely to have a fall.  Depression screening. You may be more likely to have a fall if you have a fear of falling, feel emotionally low, or feel unable to do activities that you used to do.  Alcohol use screening. Using too much alcohol can affect your balance and may make you more likely to have a fall. What actions can I take to lower my risk of falls? General instructions  Talk with your health care provider about your risks for falling. Tell your health care provider if: ? You fall. Be sure to tell your health care provider about all falls, even ones that seem minor. ? You feel dizzy, sleepy, or off-balance.  Take over-the-counter and prescription medicines only as told by your health care provider. These include any supplements.  Eat a healthy diet and maintain a healthy weight. A healthy diet includes low-fat dairy products, low-fat (lean) meats, and fiber from whole grains, beans, and lots of fruits and vegetables. Home safety  Remove any tripping hazards, such as rugs, cords, and clutter.  Install safety equipment such as grab bars in bathrooms and safety rails on stairs.  Keep rooms and walkways well-lit. Activity   Follow a regular exercise program to stay fit. This will help you maintain your balance. Ask your health care provider what types of exercise are appropriate for you.  If you need a cane or   walker, use it as recommended by your health care provider.  Wear supportive shoes that have nonskid soles. Lifestyle  Do not drink alcohol if your health care provider tells you not to drink.  If you drink alcohol, limit how much you have: ? 0-1 drink a day for women. ? 0-2 drinks a day for men.  Be aware of how much alcohol is in your drink. In the [place] one drink equals one typical bottle of beer (12  oz), one-half glass of wine (5 oz), or one shot of hard liquor (1 oz).  Do not use any products that contain nicotine or tobacco, such as cigarettes and e-cigarettes. If you need help quitting, ask your health care provider. Summary  Having a healthy lifestyle and getting preventive care can help to protect your health and wellness after age 84.  Screening and testing are the best way to find a health problem early and help you avoid having a fall. Early diagnosis and treatment give you the best chance for managing medical conditions that are more common for people who are older than age 84.  Falls are a major cause of broken bones and head injuries in people who are older than age 84. Take precautions to prevent a fall at home.  Work with your health care provider to learn what changes you can make to improve your health and wellness and to prevent falls. This information is not intended to replace advice given to you by your health care provider. Make sure you discuss any questions you have with your health care provider. Document Revised: 08/08/2018 Document Reviewed: 02/28/2017 [employer]vier Patient Education  2020 [employer]  

## 2020-03-31 DIAGNOSIS — M6281 Muscle weakness (generalized): Secondary | ICD-10-CM | POA: Diagnosis not present

## 2020-03-31 DIAGNOSIS — M25512 Pain in left shoulder: Secondary | ICD-10-CM | POA: Diagnosis not present

## 2020-03-31 DIAGNOSIS — M1990 Unspecified osteoarthritis, unspecified site: Secondary | ICD-10-CM | POA: Diagnosis not present

## 2020-03-31 DIAGNOSIS — M25511 Pain in right shoulder: Secondary | ICD-10-CM | POA: Diagnosis not present

## 2020-05-04 ENCOUNTER — Other Ambulatory Visit: Payer: Self-pay | Admitting: Internal Medicine

## 2020-06-07 ENCOUNTER — Other Ambulatory Visit: Payer: Self-pay | Admitting: Internal Medicine

## 2020-06-28 ENCOUNTER — Encounter: Payer: Self-pay | Admitting: Internal Medicine

## 2020-06-28 ENCOUNTER — Other Ambulatory Visit: Payer: Self-pay | Admitting: Internal Medicine

## 2020-06-28 DIAGNOSIS — R4189 Other symptoms and signs involving cognitive functions and awareness: Secondary | ICD-10-CM

## 2020-06-28 DIAGNOSIS — R42 Dizziness and giddiness: Secondary | ICD-10-CM

## 2020-06-28 DIAGNOSIS — E785 Hyperlipidemia, unspecified: Secondary | ICD-10-CM

## 2020-07-08 ENCOUNTER — Other Ambulatory Visit: Payer: Self-pay

## 2020-07-08 DIAGNOSIS — R42 Dizziness and giddiness: Secondary | ICD-10-CM

## 2020-07-08 DIAGNOSIS — E785 Hyperlipidemia, unspecified: Secondary | ICD-10-CM

## 2020-07-08 DIAGNOSIS — R4189 Other symptoms and signs involving cognitive functions and awareness: Secondary | ICD-10-CM

## 2020-07-09 LAB — COMPLETE METABOLIC PANEL WITH GFR
AG Ratio: 2 (calc) (ref 1.0–2.5)
ALT: 14 U/L (ref 6–29)
AST: 15 U/L (ref 10–35)
Albumin: 4.1 g/dL (ref 3.6–5.1)
Alkaline phosphatase (APISO): 81 U/L (ref 37–153)
BUN: 17 mg/dL (ref 7–25)
CO2: 22 mmol/L (ref 20–32)
Calcium: 9.9 mg/dL (ref 8.6–10.4)
Chloride: 106 mmol/L (ref 98–110)
Creat: 0.79 mg/dL (ref 0.60–0.88)
GFR, Est African American: 78 mL/min/{1.73_m2} (ref >=60)
GFR, Est Non African American: 67 mL/min/{1.73_m2} (ref >=60)
Globulin: 2.1 g/dL (calc) (ref 1.9–3.7)
Glucose, Bld: 84 mg/dL (ref 65–99)
Potassium: 4.5 mmol/L (ref 3.5–5.3)
Sodium: 140 mmol/L (ref 135–146)
Total Bilirubin: 0.3 mg/dL (ref 0.2–1.2)
Total Protein: 6.2 g/dL (ref 6.1–8.1)

## 2020-07-09 LAB — CBC WITH DIFFERENTIAL/PLATELET
Absolute Monocytes: 834 cells/uL (ref 200–950)
Basophils Absolute: 81 cells/uL (ref 0–200)
Basophils Relative: 1 %
Eosinophils Absolute: 300 cells/uL (ref 15–500)
Eosinophils Relative: 3.7 %
HCT: 39.9 % (ref 35.0–45.0)
Hemoglobin: 13.2 g/dL (ref 11.7–15.5)
Lymphs Abs: 2292 cells/uL (ref 850–3900)
MCH: 30.4 pg (ref 27.0–33.0)
MCHC: 33.1 g/dL (ref 32.0–36.0)
MCV: 91.9 fL (ref 80.0–100.0)
MPV: 11.2 fL (ref 7.5–12.5)
Monocytes Relative: 10.3 %
Neutro Abs: 4593 cells/uL (ref 1500–7800)
Neutrophils Relative %: 56.7 %
Platelets: 295 10*3/uL (ref 140–400)
RBC: 4.34 10*6/uL (ref 3.80–5.10)
RDW: 13 % (ref 11.0–15.0)
Total Lymphocyte: 28.3 %
WBC: 8.1 10*3/uL (ref 3.8–10.8)

## 2020-07-09 LAB — LIPID PANEL
Cholesterol: 230 mg/dL — ABNORMAL HIGH (ref <200)
HDL: 65 mg/dL (ref >=50)
LDL Cholesterol (Calc): 136 mg/dL (calc) — ABNORMAL HIGH
Non-HDL Cholesterol (Calc): 165 mg/dL (calc) — ABNORMAL HIGH (ref <130)
Total CHOL/HDL Ratio: 3.5 (calc) (ref <5.0)
Triglycerides: 154 mg/dL — ABNORMAL HIGH (ref <150)

## 2020-07-09 LAB — TSH: TSH: 4.96 mIU/L — ABNORMAL HIGH (ref 0.40–4.50)

## 2020-07-14 ENCOUNTER — Encounter: Payer: Self-pay | Admitting: Internal Medicine

## 2020-07-14 ENCOUNTER — Other Ambulatory Visit: Payer: Self-pay

## 2020-07-14 ENCOUNTER — Non-Acute Institutional Stay: Payer: Medicare Other | Admitting: Internal Medicine

## 2020-07-14 VITALS — BP 120/72 | HR 84 | Temp 96.3°F | Ht 62.5 in | Wt 157.6 lb

## 2020-07-14 DIAGNOSIS — R42 Dizziness and giddiness: Secondary | ICD-10-CM

## 2020-07-14 DIAGNOSIS — F5101 Primary insomnia: Secondary | ICD-10-CM

## 2020-07-14 DIAGNOSIS — M25511 Pain in right shoulder: Secondary | ICD-10-CM

## 2020-07-14 DIAGNOSIS — G8929 Other chronic pain: Secondary | ICD-10-CM

## 2020-07-14 DIAGNOSIS — F1010 Alcohol abuse, uncomplicated: Secondary | ICD-10-CM

## 2020-07-14 DIAGNOSIS — M25512 Pain in left shoulder: Secondary | ICD-10-CM

## 2020-07-14 DIAGNOSIS — R4189 Other symptoms and signs involving cognitive functions and awareness: Secondary | ICD-10-CM | POA: Diagnosis not present

## 2020-07-14 DIAGNOSIS — H9201 Otalgia, right ear: Secondary | ICD-10-CM | POA: Diagnosis not present

## 2020-07-14 DIAGNOSIS — E785 Hyperlipidemia, unspecified: Secondary | ICD-10-CM | POA: Diagnosis not present

## 2020-07-14 MED ORDER — GABAPENTIN 100 MG PO CAPS: 100.0000 mg | ORAL_CAPSULE | Freq: Every day | ORAL | 3 refills | Status: DC

## 2020-07-14 MED ORDER — CIPROFLOXACIN-DEXAMETHASONE 0.3-0.1 % OT SUSP: 4.0000 [drp] | Freq: Two times a day (BID) | OTIC | 0 refills | Status: DC

## 2020-07-14 NOTE — Progress Notes (Signed)
Location:  [place] of Service:  Clinic (12)  Provider:   Code Status:  Goals of Care:  Advanced Directives 02/04/2020  Does Patient Have a Medical Advance Directive? Yes  Type of Advance Directive Out of facility DNR (pink MOST or yellow form);Healthcare Power of Attorney  Does patient want to make changes to medical advance directive? No - Patient declined  Copy of [place] in Chart? Yes - validated most recent copy scanned in chart (See row information)  Would patient like information on creating a medical advance directive? -  Pre-existing out of facility DNR order (yellow form or pink MOST form) Yellow form placed in chart (order not valid for inpatient use)     Chief Complaint  Patient presents with  . Medical Management of Chronic Issues    5 month follow up. Patient would like to discuss her spring ear, pain and irritation on right.    HPI: Patient is a 87 y.o. female seen today for medical management of chronic diseases.     Patient has history ofhyperlipidemia,  arthritis status post right hip arthroplasty,  essential tremors,anxiety and constipation H/o Alcohol Abuse. Quit since 12/20 Also Bilateral Shoulder Pain  Acute issues Cognitive impairment Patient stable  does repeat herself. By daughter who came with her patient seems to be stable and not progressing with any worsening.  Stays independent in her ADLs and IADLs.  Walks with no assist.  No falls is not driving anymore Shoulder arthritis Doing better since steroid shot Right ear pain  Does have some episodes in which she feels lightheaded and anxious Past Medical History:  Diagnosis Date  . Allergic rhinitis due to pollen   . Anxiety state, unspecified   . Arthritis    RIGHT LEG, HANDS.  [name] Cancer (HCC)    FACE/NOSE -SKIN-squamous cell and basal cell.  . Disorder of bone and cartilage, unspecified   . Lumbago   . Lyme borreliosis   . Other atopic  dermatitis and related conditions   . Other dysphagia   . Reflux esophagitis   . Senile osteoporosis   . TMJ dysfunction    MORE ON LEFT "WEARS BITE PROTECTION AT NIGHT"    Past Surgical History:  Procedure Laterality Date  . APPENDECTOMY  1950  . CATARACT EXTRACTION, BILATERAL Bilateral   . EYE SURGERY Right    right eye ptrigium excision x2  . HERNIA REPAIR Bilateral   . TOTAL HIP ARTHROPLASTY Right 12/10/2015   Procedure: RIGHT TOTAL HIP ARTHROPLASTY ANTERIOR APPROACH;  Surgeon: [name], MD;  Location: [facility];  Service: Orthopedics;  Laterality: Right;  Spinal to General    Allergies  Allergen Reactions  . Brandy [Alcohol] Anaphylaxis    Related to grapes  . Prune Anaphylaxis    PLUMS,GRAPES,RAISINS  . Aspirin Other (See Comments)    Stomach bleed  . Augmentin [Amoxicillin-Pot Clavulanate] Hives and Itching  . Clindamycin Other (See Comments)    unkown  . Omnicef [Cefdinir] Hives  . Vinegar [Acetic Acid]     basalmic  . Alprazolam Rash  . Ceftin [Cefuroxime Axetil] Rash  . Doxycycline Rash  . Eryc [Erythromycin] Rash  . Penicillins Rash    Outpatient Encounter Medications as of 07/14/2020  Medication Sig  . acetaminophen (TYLENOL) 325 MG tablet Take 650 mg by mouth every 6 (six) hours as needed for mild pain.  . cholecalciferol (VITAMIN D) 1000 UNITS tablet Take 1,000 Units by mouth daily.  [name]  EPINEPHrine (EPIPEN 2-PAK) 0.3 mg/0.3 mL IJ SOAJ injection Inject 0.3 mLs (0.3 mg total) into the muscle as needed for anaphylaxis.  [name] GARLIC PO Take 1 tablet by mouth daily.  [name] latanoprost (XALATAN) 0.005 % ophthalmic solution Place 1 drop into both eyes at bedtime.  . senna (SENOKOT) 8.6 MG TABS tablet Take 1 tablet by mouth daily as needed for mild constipation.  . [DISCONTINUED] gabapentin (NEURONTIN) 100 MG capsule TAKE 2 CAPSULES AT BEDTIME.   No facility-administered encounter medications on file as of 07/14/2020.    Review of Systems:  Review of  Systems  Review of Systems  Constitutional: Negative for activity change, appetite change, chills, diaphoresis, fatigue and fever.  HENT: Negative for mouth sores, postnasal drip, rhinorrhea, sinus pain and sore throat.   Respiratory: Negative for apnea, cough, chest tightness, shortness of breath and wheezing.   Cardiovascular: Negative for chest pain, palpitations and leg swelling.  Gastrointestinal: Negative for abdominal distention, abdominal pain, constipation, diarrhea, nausea and vomiting.  Genitourinary: Negative for dysuria and frequency.  Musculoskeletal: Negative for arthralgias, joint swelling and myalgias.  Skin: Negative for rash.  Neurological: Negative for dizziness, syncope, weakness, light-headedness and numbness.  Psychiatric/Behavioral: Negative for behavioral problems, confusion and sleep disturbance.     Health Maintenance  Topic Date Due  . COVID-19 Vaccine (3 - Booster for Moderna series) 11/30/2019  . TETANUS/TDAP  05/03/2025  . INFLUENZA VACCINE  Completed  . DEXA SCAN  Completed  . PNA vac Low Risk Adult  Completed  . HPV VACCINES  Aged Out    Physical Exam: Vitals:   07/14/20 1326  BP: 120/72  Pulse: 84  Temp: (!) 96.3 F (35.7 C)  SpO2: 96%  Weight: 157 lb 9.6 oz (71.5 kg)  Height: 5' 2.5" (1.588 m)   Body mass index is 28.37 kg/m. Physical Exam  Constitutional: Oriented to person, place, and time. Well-developed and well-nourished.  HENT:  Head: Normocephalic.  Ears Right Minimum wax. TM normal Does have some Redness in External Canal Left Ear was normal Mouth/Throat: Oropharynx is clear and moist.  Eyes: Pupils are equal, round, and reactive to light.  Neck: Neck supple.  Cardiovascular: Normal rate and normal heart sounds.  No murmur heard. Pulmonary/Chest: Effort normal and breath sounds normal. No respiratory distress. No wheezes. She has no rales.  Abdominal: Soft. Bowel sounds are normal. No distension. There is no tenderness.  There is no rebound.  Musculoskeletal: No edema.  Lymphadenopathy: none Neurological: Alert and oriented to person, place, and time.  Skin: Skin is warm and dry.  Psychiatric: Normal mood and affect. Behavior is normal. Thought content normal.   MMSE - Mini Mental State Exam 07/14/2020 05/14/2019 11/28/2017  Orientation to time 5 5 5   Orientation to Place 5 5 5   Registration 3 3 3   Attention/ Calculation 1 1 5   Recall 1 2 2   Language- name 2 objects 2 2 2   Language- repeat 1 1 1   Language- follow 3 step command 3 3 3   Language- read & follow direction 1 1 1   Write a sentence 1 1 1   Copy design 1 1 1   Total score 24 25 29    Labs reviewed: Basic Metabolic Panel: Recent Labs    09/04/19 0800 01/29/20 0838 07/08/20 0815  NA 138 139 140  K 4.3 4.5 4.5  CL 105 108 106  CO2 28 21 22   GLUCOSE 87 82 84  BUN 15 22 17   CREATININE 0.79 0.81 0.79  CALCIUM 10.3 10.0  9.9  TSH  --   --  4.96*   Liver Function Tests: Recent Labs    09/04/19 0800 01/29/20 0838 07/08/20 0815  AST 18 15 15   ALT 13 10 14   BILITOT 0.4 0.4 0.3  PROT 6.3 6.3 6.2   No results for input(s): LIPASE, AMYLASE in the last 8760 hours. No results for input(s): AMMONIA in the last 8760 hours. CBC: Recent Labs    09/04/19 0800 01/29/20 0838 07/08/20 0815  WBC 7.6 8.5 8.1  NEUTROABS 4,324 5,049 4,593  HGB 13.3 13.0 13.2  HCT 40.6 40.4 39.9  MCV 91.4 93.3 91.9  PLT 256 265 295   Lipid Panel: Recent Labs    09/04/19 0800 01/29/20 0838 07/08/20 0815  CHOL 235* 238* 230*  HDL 63 64 65  LDLCALC 147* 150* 136*  TRIG 126 122 154*  CHOLHDL 3.7 3.7 3.5   No results found for: HGBA1C  Procedures since last visit: No results found.  Assessment/Plan 1. Right ear pain Possible Ottitis Externa' Ciprodex Ear Drops  2. Cognitive impairment Stable MMSE Discussed with the daughter.  At this time would not start any medication  3. Hyperlipidemia with target LDL less than 100 I discussed with the  daughter and patient not interested in statin want to continue diet and exercise Low risk includingno Family history no HTN or Diabetes 4. Light headedness No falls.  Continue to follow Still feeling like she cannot control her thoughts sometimes 5. Chronic pain of both shoulders Doing really well since her steroid shots  6. Alcohol abuse Says get cravings sometimes but mostly doing well without Continues to do well  7. Primary insomnia Wants to try going down on the Neurontin dose 8 mildly elevated TSH We will repeat in 6 months DEXA order Active Does need Shingrix discussed again   Labs/tests ordered:  * No order type specified * Next appt:  12/16/2020

## 2020-07-20 DIAGNOSIS — H04123 Dry eye syndrome of bilateral lacrimal glands: Secondary | ICD-10-CM | POA: Diagnosis not present

## 2020-07-20 DIAGNOSIS — H40053 Ocular hypertension, bilateral: Secondary | ICD-10-CM | POA: Diagnosis not present

## 2020-09-09 ENCOUNTER — Ambulatory Visit (INDEPENDENT_AMBULATORY_CARE_PROVIDER_SITE_OTHER): Payer: Medicare Other | Admitting: Otolaryngology

## 2020-09-10 DIAGNOSIS — D224 Melanocytic nevi of scalp and neck: Secondary | ICD-10-CM | POA: Diagnosis not present

## 2020-09-10 DIAGNOSIS — D2272 Melanocytic nevi of left lower limb, including hip: Secondary | ICD-10-CM | POA: Diagnosis not present

## 2020-09-10 DIAGNOSIS — L578 Other skin changes due to chronic exposure to nonionizing radiation: Secondary | ICD-10-CM | POA: Diagnosis not present

## 2020-09-10 DIAGNOSIS — L304 Erythema intertrigo: Secondary | ICD-10-CM | POA: Diagnosis not present

## 2020-09-10 DIAGNOSIS — L570 Actinic keratosis: Secondary | ICD-10-CM | POA: Diagnosis not present

## 2020-09-10 DIAGNOSIS — Z85828 Personal history of other malignant neoplasm of skin: Secondary | ICD-10-CM | POA: Diagnosis not present

## 2020-09-10 DIAGNOSIS — L821 Other seborrheic keratosis: Secondary | ICD-10-CM | POA: Diagnosis not present

## 2020-10-06 ENCOUNTER — Telehealth: Payer: Self-pay | Admitting: Internal Medicine

## 2020-10-06 DIAGNOSIS — Z23 Encounter for immunization: Secondary | ICD-10-CM | POA: Diagnosis not present

## 2020-10-06 NOTE — Telephone Encounter (Signed)
Also sending to [facility] while Dr. Lyndel Safe on vacation.

## 2020-10-06 NOTE — Telephone Encounter (Signed)
pts daughter(Leslie) called stating pt needs a new referral to continue or start up again Speech therapy with Rob at [facility] rehab dept.  Ms Eanes had rough day yday cognitively & feels the therapy helped in the past.  Please advise, Thanks, Lattie Haw

## 2020-10-19 DIAGNOSIS — R41841 Cognitive communication deficit: Secondary | ICD-10-CM | POA: Diagnosis not present

## 2020-10-22 DIAGNOSIS — R41841 Cognitive communication deficit: Secondary | ICD-10-CM | POA: Diagnosis not present

## 2020-10-23 ENCOUNTER — Encounter: Payer: Self-pay | Admitting: Internal Medicine

## 2020-10-25 ENCOUNTER — Other Ambulatory Visit: Payer: Self-pay | Admitting: Internal Medicine

## 2020-10-25 MED ORDER — EPINEPHRINE 0.3 MG/0.3ML IJ SOAJ: 0.3000 mg | INTRAMUSCULAR | 1 refills | Status: DC | PRN

## 2020-10-25 NOTE — Telephone Encounter (Signed)
Received a message from [facility] and they do not refill the Epi Pen.  Called and spoke with patient's daughter, Garnett Farm and she stated to send the Rx to [facility] instead.   Pended and sent to Dr. Lyndel Safe for approval.

## 2020-10-26 DIAGNOSIS — R41841 Cognitive communication deficit: Secondary | ICD-10-CM | POA: Diagnosis not present

## 2020-10-26 MED ORDER — EPINEPHRINE 0.3 MG/0.3ML IJ SOAJ: 0.3000 mg | INTRAMUSCULAR | 1 refills | Status: AC | PRN

## 2020-10-27 ENCOUNTER — Encounter: Payer: Medicare Other | Admitting: Family Medicine

## 2020-10-27 ENCOUNTER — Other Ambulatory Visit: Payer: Self-pay

## 2020-10-27 ENCOUNTER — Encounter: Payer: Self-pay | Admitting: Internal Medicine

## 2020-10-27 ENCOUNTER — Non-Acute Institutional Stay: Payer: Medicare Other | Admitting: Internal Medicine

## 2020-10-27 VITALS — BP 160/80 | HR 76 | Temp 95.8°F | Ht 62.5 in | Wt 157.0 lb

## 2020-10-27 DIAGNOSIS — G4700 Insomnia, unspecified: Secondary | ICD-10-CM

## 2020-10-27 DIAGNOSIS — R42 Dizziness and giddiness: Secondary | ICD-10-CM

## 2020-10-27 DIAGNOSIS — R030 Elevated blood-pressure reading, without diagnosis of hypertension: Secondary | ICD-10-CM | POA: Diagnosis not present

## 2020-10-27 DIAGNOSIS — M25511 Pain in right shoulder: Secondary | ICD-10-CM | POA: Diagnosis not present

## 2020-10-27 DIAGNOSIS — E785 Hyperlipidemia, unspecified: Secondary | ICD-10-CM

## 2020-10-27 DIAGNOSIS — R4189 Other symptoms and signs involving cognitive functions and awareness: Secondary | ICD-10-CM

## 2020-10-27 DIAGNOSIS — G8929 Other chronic pain: Secondary | ICD-10-CM | POA: Diagnosis not present

## 2020-10-27 DIAGNOSIS — H9201 Otalgia, right ear: Secondary | ICD-10-CM

## 2020-10-27 NOTE — Patient Instructions (Signed)
Get your BP check few times by Facility Nurse Out Drops in your ears The one you already have Start using your Gilford Rile

## 2020-10-27 NOTE — Progress Notes (Signed)
Location: [place] of Service:  Clinic (12)  Provider:   Code Status:  Goals of Care:  Advanced Directives 02/04/2020  Does Patient Have a Medical Advance Directive? Yes  Type of Advance Directive Out of facility DNR (pink MOST or yellow form);Healthcare Power of Attorney  Does patient want to make changes to medical advance directive? No - Patient declined  Copy of [place] in Chart? Yes - validated most recent copy scanned in chart (See row information)  Would patient like information on creating a medical advance directive? -  Pre-existing out of facility DNR order (yellow form or pink MOST form) Yellow form placed in chart (order not valid for inpatient use)     Chief Complaint  Patient presents with   Medical Management of Chronic Issues    Patient returns to the clinic due to some right ear pain and dizziness.    HPI: Patient is a 87 y.o. female seen today for an acute visit for Right Ear pain and Dizziness  Patient has history of hyper lipidemia, arthritis status post right hip arthroplasty,  essential tremors, anxiety and constipation H/o Alcohol Abuse. Quit since 12/20 Also Bilateral Shoulder Pain  Came to see for  Right ear pain She tries to clean it with Q tips and now having discomfort Dizziness or ? Unstable Gait per patient  Ongoing issues Has refused Further work up  Worsening Cognition per facility nurse She is working with Speech now   Past Medical History:  Diagnosis Date   Allergic rhinitis due to pollen    Anxiety state, unspecified    Arthritis    RIGHT LEG, HANDS.   Cancer (HCC)    FACE/NOSE -SKIN-squamous cell and basal cell.   Disorder of bone and cartilage, unspecified    Lumbago    Lyme borreliosis    Other atopic dermatitis and related conditions    Other dysphagia    Reflux esophagitis    Senile osteoporosis    TMJ dysfunction    MORE ON LEFT "WEARS BITE PROTECTION AT NIGHT"    Past  Surgical History:  Procedure Laterality Date   APPENDECTOMY  1950   CATARACT EXTRACTION, BILATERAL Bilateral    EYE SURGERY Right    right eye ptrigium excision x2   HERNIA REPAIR Bilateral    TOTAL HIP ARTHROPLASTY Right 12/10/2015   Procedure: RIGHT TOTAL HIP ARTHROPLASTY ANTERIOR APPROACH;  Surgeon: [name], MD;  Location: [facility];  Service: Orthopedics;  Laterality: Right;  Spinal to General    Allergies  Allergen Reactions   Brandy [Alcohol] Anaphylaxis    Related to grapes   Prune Anaphylaxis    PLUMS,GRAPES,RAISINS   Aspirin Other (See Comments)    Stomach bleed   Augmentin [Amoxicillin-Pot Clavulanate] Hives and Itching   Clindamycin Other (See Comments)    unkown   Omnicef [Cefdinir] Hives   Vinegar [Acetic Acid]     basalmic   Alprazolam Rash   Ceftin [Cefuroxime Axetil] Rash   Doxycycline Rash   Eryc [Erythromycin] Rash   Penicillins Rash    Outpatient Encounter Medications as of 10/27/2020  Medication Sig   acetaminophen (TYLENOL) 325 MG tablet Take 650 mg by mouth every 6 (six) hours as needed for mild pain.   cholecalciferol (VITAMIN D) 1000 UNITS tablet Take 1,000 Units by mouth daily.   EPINEPHrine (EPIPEN 2-PAK) 0.3 mg/0.3 mL IJ SOAJ injection Inject 0.3 mg into the muscle as needed for anaphylaxis.   gabapentin (  NEURONTIN) 100 MG capsule Take 1 capsule (100 mg total) by mouth at bedtime.   GARLIC PO Take 1 tablet by mouth daily.   latanoprost (XALATAN) 0.005 % ophthalmic solution Place 1 drop into both eyes at bedtime.   senna (SENOKOT) 8.6 MG TABS tablet Take 1 tablet by mouth daily as needed for mild constipation.   No facility-administered encounter medications on file as of 10/27/2020.    Review of Systems:  Review of Systems  Constitutional:  Positive for activity change.  HENT:  Positive for ear pain.   Respiratory: Negative.    Cardiovascular: Negative.   Gastrointestinal: Negative.   Genitourinary: Negative.   Musculoskeletal:   Positive for gait problem.  Skin: Negative.   Neurological:  Positive for dizziness.  Psychiatric/Behavioral:  Positive for confusion.    Health Maintenance  Topic Date Due   COVID-19 Vaccine (3 - Booster for Moderna series) 10/30/2019   INFLUENZA VACCINE  11/29/2020   TETANUS/TDAP  05/03/2025   DEXA SCAN  Completed   PNA vac Low Risk Adult  Completed   Zoster Vaccines- Shingrix  Completed   HPV VACCINES  Aged Out    Physical Exam: Vitals:   10/27/20 1500  BP: (!) 160/80  Pulse: 76  Temp: (!) 95.8 F (35.4 C)  SpO2: 94%  Weight: 157 lb (71.2 kg)  Height: 5' 2.5" (1.588 m)   Body mass index is 28.26 kg/m. Physical Exam Constitutional:  Well-developed and well-nourished.  HENT:  Head: Normocephalic.  Mouth/Throat: Oropharynx is clear and moist.  Ear  Right TM normal Does have Redness in External Ear Left Was normal Eyes: Pupils are equal, round, and reactive to light.  Neck: Neck supple.  Cardiovascular: Normal rate and normal heart sounds.  No murmur heard. Pulmonary/Chest: Effort normal and breath sounds normal. No respiratory distress. No wheezes. She has no rales.  Abdominal: Soft. Bowel sounds are normal. No distension. There is no tenderness. There is no rebound.  Musculoskeletal: No edema.  Lymphadenopathy: none Neurological:No Dizziness today Gait stable Skin: Skin is warm and dry.  Psychiatric: Normal mood and affect. Behavior is normal. Thought content normal.   Labs reviewed: Basic Metabolic Panel: Recent Labs    01/29/20 0838 07/08/20 0815  NA 139 140  K 4.5 4.5  CL 108 106  CO2 21 22  GLUCOSE 82 84  BUN 22 17  CREATININE 0.81 0.79  CALCIUM 10.0 9.9  TSH  --  4.96*   Liver Function Tests: Recent Labs    01/29/20 0838 07/08/20 0815  AST 15 15  ALT 10 14  BILITOT 0.4 0.3  PROT 6.3 6.2   No results for input(s): LIPASE, AMYLASE in the last 8760 hours. No results for input(s): AMMONIA in the last 8760 hours. CBC: Recent Labs     01/29/20 0838 07/08/20 0815  WBC 8.5 8.1  NEUTROABS 5,049 4,593  HGB 13.0 13.2  HCT 40.4 39.9  MCV 93.3 91.9  PLT 265 295   Lipid Panel: Recent Labs    01/29/20 0838 07/08/20 0815  CHOL 238* 230*  HDL 64 65  LDLCALC 150* 136*  TRIG 122 154*  CHOLHDL 3.7 3.5   No results found for: HGBA1C  Procedures since last visit: No results found.  Assessment/Plan Right ear pain Restart Ciprodex She had bottle in her Pocket book will not Reorder right now Avoid using Q tips Dizziness She refuses to work with Therapy ? Etiology Have Reduced Neurontin from 300 -100 but has not changed Told her to  use [place]   Neurology Referral Will d/w her pOA  Will discuss next visit Cognitive impairment Working with therapy No Med per POA right now Hyperlipidemia with target LDL less than 100 No Statin per daughter and patient  Chronic right shoulder pain S/p Shot Now pain is back Takes tylenol prn Insomnia, unspecified type Neurontin 100 mg is helping Elevated blood pressure reading Talked to facility nurse will check it Q weekly for now Elevated TSH Repeat labs pending  DEXA order Active Does need Shingrix discussed again   Next appt:  12/16/2020

## 2020-10-28 DIAGNOSIS — R41841 Cognitive communication deficit: Secondary | ICD-10-CM | POA: Diagnosis not present

## 2020-10-29 ENCOUNTER — Other Ambulatory Visit: Payer: Self-pay | Admitting: Internal Medicine

## 2020-10-29 NOTE — Telephone Encounter (Signed)
RX is awaiting approval from [place]. I will also forward to NP for [facility] due to an extended weekend approaching

## 2020-10-29 NOTE — Telephone Encounter (Signed)
Spoke with patients daughter who informed me that patient seen Dr.Gupta on Wednesday and was told to keep using requested ear drops for seasonal ear concerns.  I informed [name] that the delay in refilling is because the requested medication is not on patients active medication list and although she only uses it seasonally she should never have it removed, otherwise it will cause contrast and delay when requested. [name] verbalized understanding.  Request will be sent to Dr.Gupta to review and approve in light of the fact that it is not listed on her medication lise.

## 2020-11-04 DIAGNOSIS — R41841 Cognitive communication deficit: Secondary | ICD-10-CM | POA: Diagnosis not present

## 2020-11-09 DIAGNOSIS — R41841 Cognitive communication deficit: Secondary | ICD-10-CM | POA: Diagnosis not present

## 2020-11-11 DIAGNOSIS — R41841 Cognitive communication deficit: Secondary | ICD-10-CM | POA: Diagnosis not present

## 2020-11-12 DIAGNOSIS — H66004 Acute suppurative otitis media without spontaneous rupture of ear drum, recurrent, right ear: Secondary | ICD-10-CM | POA: Diagnosis not present

## 2020-11-16 DIAGNOSIS — R41841 Cognitive communication deficit: Secondary | ICD-10-CM | POA: Diagnosis not present

## 2020-11-18 DIAGNOSIS — R41841 Cognitive communication deficit: Secondary | ICD-10-CM | POA: Diagnosis not present

## 2020-11-23 DIAGNOSIS — R41841 Cognitive communication deficit: Secondary | ICD-10-CM | POA: Diagnosis not present

## 2020-11-25 DIAGNOSIS — R41841 Cognitive communication deficit: Secondary | ICD-10-CM | POA: Diagnosis not present

## 2020-12-02 ENCOUNTER — Ambulatory Visit (INDEPENDENT_AMBULATORY_CARE_PROVIDER_SITE_OTHER): Payer: Medicare Other | Admitting: Otolaryngology

## 2020-12-02 ENCOUNTER — Other Ambulatory Visit: Payer: Self-pay

## 2020-12-02 DIAGNOSIS — H9201 Otalgia, right ear: Secondary | ICD-10-CM

## 2020-12-02 DIAGNOSIS — J310 Chronic rhinitis: Secondary | ICD-10-CM

## 2020-12-02 DIAGNOSIS — H6121 Impacted cerumen, right ear: Secondary | ICD-10-CM

## 2020-12-02 DIAGNOSIS — M26609 Unspecified temporomandibular joint disorder, unspecified side: Secondary | ICD-10-CM
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
## 2020-12-16 ENCOUNTER — Encounter: Payer: Medicare Other | Admitting: Nurse Practitioner

## 2020-12-23 ENCOUNTER — Other Ambulatory Visit: Payer: Self-pay

## 2020-12-23 ENCOUNTER — Ambulatory Visit (INDEPENDENT_AMBULATORY_CARE_PROVIDER_SITE_OTHER): Payer: Medicare Other | Admitting: Nurse Practitioner

## 2020-12-23 ENCOUNTER — Telehealth: Payer: Self-pay

## 2020-12-23 ENCOUNTER — Encounter: Payer: Self-pay | Admitting: Nurse Practitioner

## 2020-12-23 DIAGNOSIS — Z0000 Encounter for general adult medical examination without abnormal findings: Secondary | ICD-10-CM | POA: Diagnosis not present
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
## 2020-12-23 NOTE — Telephone Encounter (Signed)
Ms. Rachel Morris, Rachel Morris are scheduled for a virtual visit with your provider today.    Just as we do with appointments in the office, we must obtain your consent to participate.  Your consent will be active for this visit and any virtual visit you may have with one of our providers in the next 365 days.    If you have a MyChart account, I can also send a copy of this consent to you electronically.  All virtual visits are billed to your insurance company just like a traditional visit in the office.  As this is a virtual visit, video technology does not allow for your provider to perform a traditional examination.  This may limit your provider's ability to fully assess your condition.  If your provider identifies any concerns that need to be evaluated in person or the need to arrange testing such as labs, EKG, etc, we will make arrangements to do so.    Although advances in technology are sophisticated, we cannot ensure that it will always work on either your end or our end.  If the connection with a video visit is poor, we may have to switch to a telephone visit.  With either a video or telephone visit, we are not always able to ensure that we have a secure connection.   I need to obtain your verbal consent now.   Are you willing to proceed with your visit today?   Rachel Morris has provided verbal consent on 12/23/2020 for a virtual visit (video or telephone).   Leigh Aurora Blunt, [place] 12/23/2020  1:00 pm

## 2020-12-23 NOTE — Patient Instructions (Signed)
Rachel Morris , Thank you for taking time to come for your Medicare Wellness Visit. I appreciate your ongoing commitment to your health goals. Please review the following plan we discussed and let me know if I can assist you in the future.   Screening recommendations/referrals: Colonoscopy aged out Mammogram aged out Bone Density declined at this time Recommended yearly ophthalmology/optometry visit for glaucoma screening and checkup Recommended yearly dental visit for hygiene and checkup  Vaccinations: Influenza vaccine yearly- due at this time. Pneumococcal vaccine up to date Tdap vaccine up to date Shingles vaccine up to date    Advanced directives: on file.   Conditions/risks identified: advanced age, progressive memory loss  Next appointment: 1 year for AWV   Preventive Care 84 Years and Older, Female Preventive care refers to lifestyle choices and visits with your health care provider that can promote health and wellness. What does preventive care include? A yearly physical exam. This is also called an annual well check. Dental exams once or twice a year. Routine eye exams. Ask your health care provider how often you should have your eyes checked. Personal lifestyle choices, including: Daily care of your teeth and gums. Regular physical activity. Eating a healthy diet. Avoiding tobacco and drug use. Limiting alcohol use. Practicing safe sex. Taking low-dose aspirin every day. Taking vitamin and mineral supplements as recommended by your health care provider. What happens during an annual well check? The services and screenings done by your health care provider during your annual well check will depend on your age, overall health, lifestyle risk factors, and family history of disease. Counseling  Your health care provider may ask you questions about your: Alcohol use. Tobacco use. Drug use. Emotional well-being. Home and relationship well-being. Sexual  activity. Eating habits. History of falls. Memory and ability to understand (cognition). Work and work Statistician. Reproductive health. Screening  You may have the following tests or measurements: Height, weight, and BMI. Blood pressure. Lipid and cholesterol levels. These may be checked every 5 years, or more frequently if you are over 76 years old. Skin check. Lung cancer screening. You may have this screening every year starting at age 40 if you have a 30-pack-year history of smoking and currently smoke or have quit within the past 15 years. Fecal occult blood test (FOBT) of the stool. You may have this test every year starting at age 4. Flexible sigmoidoscopy or colonoscopy. You may have a sigmoidoscopy every 5 years or a colonoscopy every 10 years starting at age 87. Hepatitis C blood test. Hepatitis B blood test. Sexually transmitted disease (STD) testing. Diabetes screening. This is done by checking your blood sugar (glucose) after you have not eaten for a while (fasting). You may have this done every 1-3 years. Bone density scan. This is done to screen for osteoporosis. You may have this done starting at age 52. Mammogram. This may be done every 1-2 years. Talk to your health care provider about how often you should have regular mammograms. Talk with your health care provider about your test results, treatment options, and if necessary, the need for more tests. Vaccines  Your health care provider may recommend certain vaccines, such as: Influenza vaccine. This is recommended every year. Tetanus, diphtheria, and acellular pertussis (Tdap, Td) vaccine. You may need a Td booster every 10 years. Zoster vaccine. You may need this after age 50. Pneumococcal 13-valent conjugate (PCV13) vaccine. One dose is recommended after age 73. Pneumococcal polysaccharide (PPSV23) vaccine. One dose is recommended after age  37. Talk to your health care provider about which screenings and vaccines  you need and how often you need them. This information is not intended to replace advice given to you by your health care provider. Make sure you discuss any questions you have with your health care provider. Document Released: 05/14/2015 Document Revised: 01/05/2016 Document Reviewed: 02/16/2015 Elsevier Interactive Patient Education  2017 [employer] [employer] Prevention in the Home Falls can cause injuries. They can happen to people of all ages. There are many things you can do to make your home safe and to help prevent falls. What can I do on the outside of my home? Regularly fix the edges of walkways and driveways and fix any cracks. Remove anything that might make you trip as you walk through a door, such as a raised step or threshold. Trim any bushes or trees on the path to your home. Use bright outdoor lighting. Clear any walking paths of anything that might make someone trip, such as rocks or tools. Regularly check to see if handrails are loose or broken. Make sure that both sides of any steps have handrails. Any raised decks and porches should have guardrails on the edges. Have any leaves, snow, or ice cleared regularly. Use sand or salt on walking paths during winter. Clean up any spills in your garage right away. This includes oil or grease spills. What can I do in the bathroom? Use night lights. Install grab bars by the toilet and in the tub and shower. Do not use towel bars as grab bars. Use non-skid mats or decals in the tub or shower. If you need to sit down in the shower, use a plastic, non-slip stool. Keep the floor dry. Clean up any water that spills on the floor as soon as it happens. Remove soap buildup in the tub or shower regularly. Attach bath mats securely with double-sided non-slip rug tape. Do not have throw rugs and other things on the floor that can make you trip. What can I do in the bedroom? Use night lights. Make sure that you have a light by your bed that  is easy to reach. Do not use any sheets or blankets that are too big for your bed. They should not hang down onto the floor. Have a firm chair that has side arms. You can use this for support while you get dressed. Do not have throw rugs and other things on the floor that can make you trip. What can I do in the kitchen? Clean up any spills right away. Avoid walking on wet floors. Keep items that you use a lot in easy-to-reach places. If you need to reach something above you, use a strong step stool that has a grab bar. Keep electrical cords out of the way. Do not use floor polish or wax that makes floors slippery. If you must use wax, use non-skid floor wax. Do not have throw rugs and other things on the floor that can make you trip. What can I do with my stairs? Do not leave any items on the stairs. Make sure that there are handrails on both sides of the stairs and use them. Fix handrails that are broken or loose. Make sure that handrails are as long as the stairways. Check any carpeting to make sure that it is firmly attached to the stairs. Fix any carpet that is loose or worn. Avoid having throw rugs at the top or bottom of the stairs. If you do have  throw rugs, attach them to the floor with carpet tape. Make sure that you have a light switch at the top of the stairs and the bottom of the stairs. If you do not have them, ask someone to add them for you. What else can I do to help prevent falls? Wear shoes that: Do not have high heels. Have rubber bottoms. Are comfortable and fit you well. Are closed at the toe. Do not wear sandals. If you use a stepladder: Make sure that it is fully opened. Do not climb a closed stepladder. Make sure that both sides of the stepladder are locked into place. Ask someone to hold it for you, if possible. Clearly mark and make sure that you can see: Any grab bars or handrails. First and last steps. Where the edge of each step is. Use tools that help you  move around (mobility aids) if they are needed. These include: Canes. Walkers. Scooters. Crutches. Turn on the lights when you go into a dark area. Replace any light bulbs as soon as they burn out. Set up your furniture so you have a clear path. Avoid moving your furniture around. If any of your floors are uneven, fix them. If there are any pets around you, be aware of where they are. Review your medicines with your doctor. Some medicines can make you feel dizzy. This can increase your chance of falling. Ask your doctor what other things that you can do to help prevent falls. This information is not intended to replace advice given to you by your health care provider. Make sure you discuss any questions you have with your health care provider. Document Released: 02/11/2009 Document Revised: 09/23/2015 Document Reviewed: 05/22/2014 Elsevier Interactive Patient Education  2017 [employer].

## 2021-01-05 DIAGNOSIS — R4189 Other symptoms and signs involving cognitive functions and awareness: Secondary | ICD-10-CM | POA: Diagnosis not present

## 2021-01-05 DIAGNOSIS — E785 Hyperlipidemia, unspecified: Secondary | ICD-10-CM | POA: Diagnosis not present

## 2021-01-06 ENCOUNTER — Other Ambulatory Visit: Payer: Self-pay

## 2021-01-06 DIAGNOSIS — E785 Hyperlipidemia, unspecified: Secondary | ICD-10-CM

## 2021-01-06 DIAGNOSIS — R4189 Other symptoms and signs involving cognitive functions and awareness: Secondary | ICD-10-CM

## 2021-01-06 LAB — COMPLETE METABOLIC PANEL WITH GFR
AG Ratio: 1.9 (calc) (ref 1.0–2.5)
ALT: 11 U/L (ref 6–29)
AST: 14 U/L (ref 10–35)
Albumin: 4.3 g/dL (ref 3.6–5.1)
Alkaline phosphatase (APISO): 90 U/L (ref 37–153)
BUN: 12 mg/dL (ref 7–25)
CO2: 25 mmol/L (ref 20–32)
Calcium: 10 mg/dL (ref 8.6–10.4)
Chloride: 105 mmol/L (ref 98–110)
Creat: 0.74 mg/dL (ref 0.60–0.95)
Globulin: 2.3 g/dL (calc) (ref 1.9–3.7)
Glucose, Bld: 85 mg/dL (ref 65–99)
Potassium: 4.4 mmol/L (ref 3.5–5.3)
Sodium: 139 mmol/L (ref 135–146)
Total Bilirubin: 0.4 mg/dL (ref 0.2–1.2)
Total Protein: 6.6 g/dL (ref 6.1–8.1)
eGFR: 78 mL/min/{1.73_m2} (ref >=60)

## 2021-01-06 LAB — LIPID PANEL
Cholesterol: 249 mg/dL — ABNORMAL HIGH (ref <200)
HDL: 61 mg/dL (ref >=50)
LDL Cholesterol (Calc): 161 mg/dL (calc) — ABNORMAL HIGH
Non-HDL Cholesterol (Calc): 188 mg/dL (calc) — ABNORMAL HIGH (ref <130)
Total CHOL/HDL Ratio: 4.1 (calc) (ref <5.0)
Triglycerides: 148 mg/dL (ref <150)

## 2021-01-06 LAB — CBC WITH DIFFERENTIAL/PLATELET
Absolute Monocytes: 927 cells/uL (ref 200–950)
Basophils Absolute: 81 cells/uL (ref 0–200)
Basophils Relative: 0.9 %
Eosinophils Absolute: 333 cells/uL (ref 15–500)
Eosinophils Relative: 3.7 %
HCT: 39.1 % (ref 35.0–45.0)
Hemoglobin: 13.1 g/dL (ref 11.7–15.5)
Lymphs Abs: 2340 cells/uL (ref 850–3900)
MCH: 31 pg (ref 27.0–33.0)
MCHC: 33.5 g/dL (ref 32.0–36.0)
MCV: 92.4 fL (ref 80.0–100.0)
MPV: 11.2 fL (ref 7.5–12.5)
Monocytes Relative: 10.3 %
Neutro Abs: 5319 cells/uL (ref 1500–7800)
Neutrophils Relative %: 59.1 %
Platelets: 298 10*3/uL (ref 140–400)
RBC: 4.23 10*6/uL (ref 3.80–5.10)
RDW: 13 % (ref 11.0–15.0)
Total Lymphocyte: 26 %
WBC: 9 10*3/uL (ref 3.8–10.8)

## 2021-01-06 LAB — TSH: TSH: 4.83 mIU/L — ABNORMAL HIGH (ref 0.40–4.50)

## 2021-01-12 ENCOUNTER — Non-Acute Institutional Stay: Payer: Medicare Other | Admitting: Internal Medicine

## 2021-01-12 ENCOUNTER — Encounter: Payer: Self-pay | Admitting: Internal Medicine

## 2021-01-12 ENCOUNTER — Other Ambulatory Visit: Payer: Self-pay

## 2021-01-12 VITALS — BP 138/88 | HR 84 | Temp 96.3°F | Ht 62.5 in | Wt 158.4 lb

## 2021-01-12 DIAGNOSIS — M25511 Pain in right shoulder: Secondary | ICD-10-CM

## 2021-01-12 DIAGNOSIS — R4189 Other symptoms and signs involving cognitive functions and awareness: Secondary | ICD-10-CM

## 2021-01-12 DIAGNOSIS — R42 Dizziness and giddiness: Secondary | ICD-10-CM | POA: Diagnosis not present

## 2021-01-12 DIAGNOSIS — G8929 Other chronic pain: Secondary | ICD-10-CM | POA: Diagnosis not present

## 2021-01-12 DIAGNOSIS — E785 Hyperlipidemia, unspecified: Secondary | ICD-10-CM | POA: Diagnosis not present

## 2021-01-12 DIAGNOSIS — G4700 Insomnia, unspecified: Secondary | ICD-10-CM

## 2021-01-12 DIAGNOSIS — H9201 Otalgia, right ear: Secondary | ICD-10-CM | POA: Diagnosis not present

## 2021-01-12 NOTE — Progress Notes (Signed)
Location:  [place] of Service:  Clinic (12)  Provider:   Code Status: DNR Goals of Care:  Advanced Directives 02/04/2020  Does Patient Have a Medical Advance Directive? Yes  Type of Advance Directive Out of facility DNR (pink MOST or yellow form);Healthcare Power of Attorney  Does patient want to make changes to medical advance directive? No - Patient declined  Copy of [place] in Chart? Yes - validated most recent copy scanned in chart (See row information)  Would patient like information on creating a medical advance directive? -  Pre-existing out of facility DNR order (yellow form or pink MOST form) Yellow form placed in chart (order not valid for inpatient use)     Chief Complaint  Patient presents with   Medical Management of Chronic Issues    Patient returns to the clinic for her 6 month follow up.     HPI: Patient is a 87 y.o. female seen today for medical management of chronic diseases.    Patient has history of hyper lipidemia, arthritis status post right hip arthroplasty,  essential tremors, anxiety and constipation H/o Alcohol Abuse. Quit since 12/20 Also Bilateral Shoulder Pain  Right Ear Pain  Was seen by Dr Mechele Collin Debris He said no signs of Infection Patient is having symptoms again and wants referal to another ENT  No Other issues Cognition stable  Worked with Speech and that helped Doing well with her ADLS Her daughter helps her with other stuff Dizziness or ? Unstable Gait per patient  Ongoing issues Has refused Further work up  No Falls   Past Medical History:  Diagnosis Date   Allergic rhinitis due to pollen    Anxiety state, unspecified    Arthritis    RIGHT LEG, HANDS.   Cancer (HCC)    FACE/NOSE -SKIN-squamous cell and basal cell.   Disorder of bone and cartilage, unspecified    Lumbago    Lyme borreliosis    Other atopic dermatitis and related conditions    Other dysphagia    Reflux  esophagitis    Senile osteoporosis    TMJ dysfunction    MORE ON LEFT "WEARS BITE PROTECTION AT NIGHT"    Past Surgical History:  Procedure Laterality Date   APPENDECTOMY  1950   CATARACT EXTRACTION, BILATERAL Bilateral    EYE SURGERY Right    right eye ptrigium excision x2   HERNIA REPAIR Bilateral    TOTAL HIP ARTHROPLASTY Right 12/10/2015   Procedure: RIGHT TOTAL HIP ARTHROPLASTY ANTERIOR APPROACH;  Surgeon: [name], MD;  Location: [facility];  Service: Orthopedics;  Laterality: Right;  Spinal to General    Allergies  Allergen Reactions   Brandy [Alcohol] Anaphylaxis    Related to grapes   Prune Anaphylaxis    PLUMS,GRAPES,RAISINS   Aspirin Other (See Comments)    Stomach bleed   Augmentin [Amoxicillin-Pot Clavulanate] Hives and Itching   Clindamycin Other (See Comments)    unkown   Omnicef [Cefdinir] Hives   Vinegar [Acetic Acid]     basalmic   Alprazolam Rash   Ceftin [Cefuroxime Axetil] Rash   Doxycycline Rash   Eryc [Erythromycin] Rash   Penicillins Rash    Outpatient Encounter Medications as of 01/12/2021  Medication Sig   acetaminophen (TYLENOL) 325 MG tablet Take 650 mg by mouth every 6 (six) hours as needed for mild pain.   cholecalciferol (VITAMIN D) 1000 UNITS tablet Take 1,000 Units by mouth daily.  EPINEPHrine (EPIPEN 2-PAK) 0.3 mg/0.3 mL IJ SOAJ injection Inject 0.3 mg into the muscle as needed for anaphylaxis.   fluticasone (FLONASE) 50 MCG/ACT nasal spray Place 2 sprays into both nostrils daily.   gabapentin (NEURONTIN) 100 MG capsule Take 1 capsule (100 mg total) by mouth at bedtime.   GARLIC PO Take 1 tablet by mouth daily.   latanoprost (XALATAN) 0.005 % ophthalmic solution Place 1 drop into both eyes at bedtime.   senna (SENOKOT) 8.6 MG TABS tablet Take 1 tablet by mouth daily as needed for mild constipation.   [DISCONTINUED] ciprofloxacin-dexamethasone (CIPRODEX) OTIC suspension Place 4 drops into the right ear at bedtime.   No  facility-administered encounter medications on file as of 01/12/2021.    Review of Systems:  Review of Systems Review of Systems  Constitutional: Negative for activity change, appetite change, chills, diaphoresis, fatigue and fever.  HENT: Negative for mouth sores, postnasal drip, rhinorrhea, sinus pain and sore throat.   Respiratory: Negative for apnea, cough, chest tightness, shortness of breath and wheezing.   Cardiovascular: Negative for chest pain, palpitations and leg swelling.  Gastrointestinal: Negative for abdominal distention, abdominal pain, constipation, diarrhea, nausea and vomiting.  Genitourinary: Negative for dysuria and frequency.  Musculoskeletal: Negative for arthralgias, joint swelling and myalgias.  Skin: Negative for rash.  Neurological: Negative for syncope, weakness, light-headedness and numbness.  Psychiatric/Behavioral: Negative for behavioral problems, and sleep disturbance.   Health Maintenance  Topic Date Due   INFLUENZA VACCINE  11/29/2020   COVID-19 Vaccine (5 - Booster for Moderna series) 02/05/2021   TETANUS/TDAP  05/03/2025   DEXA SCAN  Completed   PNA vac Low Risk Adult  Completed   Zoster Vaccines- Shingrix  Completed   HPV VACCINES  Aged Out    Physical Exam: Vitals:   01/12/21 1359  BP: 138/88  Pulse: 84  Temp: (!) 96.3 F (35.7 C)  SpO2: 92%  Weight: 158 lb 6.4 oz (71.8 kg)  Height: 5' 2.5" (1.588 m)   Body mass index is 28.51 kg/m. Physical Exam Constitutional: Oriented to person, place, and time. Well-developed and well-nourished.  HENT:  Head: Normocephalic.  Mouth/Throat: Oropharynx is clear and moist.  Eyes: Pupils are equal, round, and reactive to light.  Ears TM Right dull Does have Debris again in her ear Left Clean TM normal Neck: Neck supple.  Cardiovascular: Normal rate and normal heart sounds.  No murmur heard. Pulmonary/Chest: Effort normal and breath sounds normal. No respiratory distress. No wheezes. She has no  rales.  Abdominal: Soft. Bowel sounds are normal. No distension. There is no tenderness. There is no rebound.  Musculoskeletal: No edema.  Lymphadenopathy: none Neurological: Alert and oriented to person, place, and time. Skin: Skin is warm and dry.  Psychiatric: Normal mood and affect. Behavior is normal. Thought content normal.   Labs reviewed: Basic Metabolic Panel: Recent Labs    01/29/20 0838 07/08/20 0815 01/05/21 0834  NA 139 140 139  K 4.5 4.5 4.4  CL 108 106 105  CO2 '21 22 25  '$ GLUCOSE 82 84 85  BUN '22 17 12  '$ CREATININE 0.81 0.79 0.74  CALCIUM 10.0 9.9 10.0  TSH  --  4.96* 4.83*   Liver Function Tests: Recent Labs    01/29/20 0838 07/08/20 0815 01/05/21 0834  AST '15 15 14  '$ ALT '10 14 11  '$ BILITOT 0.4 0.3 0.4  PROT 6.3 6.2 6.6   No results for input(s): LIPASE, AMYLASE in the last 8760 hours. No results for input(s): AMMONIA  in the last 8760 hours. CBC: Recent Labs    01/29/20 0838 07/08/20 0815 01/05/21 0834  WBC 8.5 8.1 9.0  NEUTROABS 5,049 4,593 5,319  HGB 13.0 13.2 13.1  HCT 40.4 39.9 39.1  MCV 93.3 91.9 92.4  PLT 265 295 298   Lipid Panel: Recent Labs    01/29/20 0838 07/08/20 0815 01/05/21 0834  CHOL 238* 230* 249*  HDL 64 65 61  LDLCALC 150* 136* 161*  TRIG 122 154* 148  CHOLHDL 3.7 3.5 4.1   No results found for: HGBA1C  Procedures since last visit: No results found.  Assessment/Plan 1. Right ear pain Does use Flonase PRN  - Ambulatory referral to ENT  2. Cognitive impairment MMSE has been stable Repeat Next visit  3. Hyperlipidemia with target LDL less than 100 Discussed different options Diet and exercise for now  4. Chronic right shoulder pain Doing well since therapy  5. Dizziness Chronic issue  6. Insomnia, unspecified type Low dose of Neurontin  7 Elevated TSH Repeat in 6 months  Labs/tests ordered:  * No order type specified * Next appt:  04/21/2021

## 2021-01-18 DIAGNOSIS — H04123 Dry eye syndrome of bilateral lacrimal glands: Secondary | ICD-10-CM | POA: Diagnosis not present

## 2021-01-18 DIAGNOSIS — H40053 Ocular hypertension, bilateral: Secondary | ICD-10-CM | POA: Diagnosis not present

## 2021-01-18 DIAGNOSIS — Z961 Presence of intraocular lens: Secondary | ICD-10-CM | POA: Diagnosis not present

## 2021-01-19 DIAGNOSIS — Z23 Encounter for immunization: Secondary | ICD-10-CM | POA: Diagnosis not present

## 2021-01-20 ENCOUNTER — Encounter: Payer: Self-pay | Admitting: Nurse Practitioner

## 2021-02-03 DIAGNOSIS — Z23 Encounter for immunization: Secondary | ICD-10-CM | POA: Diagnosis not present

## 2021-02-16 ENCOUNTER — Non-Acute Institutional Stay: Payer: Medicare Other | Admitting: Internal Medicine

## 2021-02-16 ENCOUNTER — Other Ambulatory Visit: Payer: Self-pay

## 2021-02-16 ENCOUNTER — Encounter: Payer: Self-pay | Admitting: Internal Medicine

## 2021-02-16 VITALS — BP 144/63 | HR 76 | Temp 96.3°F | Ht 62.0 in | Wt 158.7 lb

## 2021-02-16 DIAGNOSIS — H9201 Otalgia, right ear: Secondary | ICD-10-CM

## 2021-02-16 DIAGNOSIS — R4189 Other symptoms and signs involving cognitive functions and awareness: Secondary | ICD-10-CM | POA: Diagnosis not present

## 2021-02-16 DIAGNOSIS — F339 Major depressive disorder, recurrent, unspecified: Secondary | ICD-10-CM | POA: Diagnosis not present

## 2021-02-16 DIAGNOSIS — F1011 Alcohol abuse, in remission: Secondary | ICD-10-CM

## 2021-02-16 NOTE — Progress Notes (Signed)
Location: [place] of Service:  Clinic (12)  Provider:   Code Status: DNR Goals of Care:  Advanced Directives 02/16/2021  Does Patient Have a Medical Advance Directive? Yes  Type of Paramedic of [place];Out of facility DNR (pink MOST or yellow form)  Does patient want to make changes to medical advance directive? No - Patient declined  Copy of [place] in Chart? Yes - validated most recent copy scanned in chart (See row information)  Would patient like information on creating a medical advance directive? -  Pre-existing out of facility DNR order (yellow form or pink MOST form) Yellow form placed in chart (order not valid for inpatient use)     Chief Complaint  Patient presents with   Medical Management of Chronic Issues    Patient returns to the clinic to discuss reintroducing alcohol.      HPI: Patient is a 87 y.o. female seen today for an acute visit for Possible depression and Alcohol Craving  Patient has history of hyper lipidemia, arthritis status post right hip arthroplasty,  essential tremors, anxiety and constipation H/o Alcohol Abuse. Quit since 12/20 Also Bilateral Shoulder Pain  Right Ear Pain   Patient came with her daughter who says that she called her few weeks ago asking for [facility] her type of Drink. She told her she sees no point of Prolonging life like this She was depressed today but think it is temporary due to some changes going on in Facility which she doesn't like She is sleeping good. Weight stable Walks around and participate in activities Past Medical History:  Diagnosis Date   Allergic rhinitis due to pollen    Anxiety state, unspecified    Arthritis    RIGHT LEG, HANDS.   Cancer (HCC)    FACE/NOSE -SKIN-squamous cell and basal cell.   Disorder of bone and cartilage, unspecified    Lumbago    Lyme borreliosis    Other atopic dermatitis and related conditions    Other  dysphagia    Reflux esophagitis    Senile osteoporosis    TMJ dysfunction    MORE ON LEFT "WEARS BITE PROTECTION AT NIGHT"    Past Surgical History:  Procedure Laterality Date   APPENDECTOMY  1950   CATARACT EXTRACTION, BILATERAL Bilateral    EYE SURGERY Right    right eye ptrigium excision x2   HERNIA REPAIR Bilateral    TOTAL HIP ARTHROPLASTY Right 12/10/2015   Procedure: RIGHT TOTAL HIP ARTHROPLASTY ANTERIOR APPROACH;  Surgeon: [name], MD;  Location: [facility];  Service: Orthopedics;  Laterality: Right;  Spinal to General    Allergies  Allergen Reactions   Brandy [Alcohol] Anaphylaxis    Related to grapes   Prune Anaphylaxis    PLUMS,GRAPES,RAISINS   Aspirin Other (See Comments)    Stomach bleed   Augmentin [Amoxicillin-Pot Clavulanate] Hives and Itching   Clindamycin Other (See Comments)    unkown   Omnicef [Cefdinir] Hives   Vinegar [Acetic Acid]     basalmic   Alprazolam Rash   Ceftin [Cefuroxime Axetil] Rash   Doxycycline Rash   Eryc [Erythromycin] Rash   Penicillins Rash    Outpatient Encounter Medications as of 02/16/2021  Medication Sig   acetaminophen (TYLENOL) 325 MG tablet Take 650 mg by mouth every 6 (six) hours as needed for mild pain.   cholecalciferol (VITAMIN D) 1000 UNITS tablet Take 1,000 Units by mouth daily.   EPINEPHrine (EPIPEN  2-PAK) 0.3 mg/0.3 mL IJ SOAJ injection Inject 0.3 mg into the muscle as needed for anaphylaxis.   fluticasone (FLONASE) 50 MCG/ACT nasal spray Place 2 sprays into both nostrils daily.   gabapentin (NEURONTIN) 100 MG capsule Take 1 capsule (100 mg total) by mouth at bedtime.   GARLIC PO Take 1 tablet by mouth daily.   latanoprost (XALATAN) 0.005 % ophthalmic solution Place 1 drop into both eyes at bedtime.   senna (SENOKOT) 8.6 MG TABS tablet Take 1 tablet by mouth daily as needed for mild constipation.   No facility-administered encounter medications on file as of 02/16/2021.    Review of Systems:   Review of Systems Review of Systems  Constitutional: Negative for activity change, appetite change, chills, diaphoresis, fatigue and fever.  HENT: Negative for mouth sores, postnasal drip, rhinorrhea, sinus pain and sore throat.   Respiratory: Negative for apnea, cough, chest tightness, shortness of breath and wheezing.   Cardiovascular: Negative for chest pain, palpitations and leg swelling.  Gastrointestinal: Negative for abdominal distention, abdominal pain, constipation, diarrhea, nausea and vomiting.  Genitourinary: Negative for dysuria and frequency.  Musculoskeletal: Negative for arthralgias, joint swelling and myalgias.  Skin: Negative for rash.  Neurological: Negative for dizziness, syncope, weakness, light-headedness and numbness.  Psychiatric/Behavioral: Negative for behavioral problems, confusion and sleep disturbance.    Health Maintenance  Topic Date Due   COVID-19 Vaccine (5 - Booster for Moderna series) 12/01/2020   TETANUS/TDAP  05/03/2025   Pneumonia Vaccine 69+ Years old  Completed   INFLUENZA VACCINE  Completed   DEXA SCAN  Completed   Zoster Vaccines- Shingrix  Completed   HPV VACCINES  Aged Out    Physical Exam: Vitals:   02/16/21 1404  BP: (!) 144/63  Pulse: 76  Temp: (!) 96.3 F (35.7 C)  SpO2: 99%  Weight: 158 lb 11.2 oz (72 kg)  Height: 5\' 2"  (1.575 m)   Body mass index is 29.03 kg/m. Physical Exam Constitutional: Oriented to person, place, and time. Well-developed and well-nourished.  HENT:  Head: Normocephalic.  Mouth/Throat: Oropharynx is clear and moist.  Eyes: Pupils are equal, round, and reactive to light.  Neck: Neck supple.  Cardiovascular: Normal rate and normal heart sounds.  No murmur heard. Pulmonary/Chest: Effort normal and breath sounds normal. No respiratory distress. No wheezes. She has no rales.  Abdominal: Soft. Bowel sounds are normal. No distension. There is no tenderness. There is no rebound.  Musculoskeletal: No edema.   Lymphadenopathy: none Neurological: Alert and oriented to person, place, and time.  Skin: Skin is warm and dry.  Psychiatric: Normal mood and affect. Behavior is normal. Thought content normal.   Labs reviewed: Basic Metabolic Panel: Recent Labs    07/08/20 0815 01/05/21 0834  NA 140 139  K 4.5 4.4  CL 106 105  CO2 22 25  GLUCOSE 84 85  BUN 17 12  CREATININE 0.79 0.74  CALCIUM 9.9 10.0  TSH 4.96* 4.83*   Liver Function Tests: Recent Labs    07/08/20 0815 01/05/21 0834  AST 15 14  ALT 14 11  BILITOT 0.3 0.4  PROT 6.2 6.6   No results for input(s): LIPASE, AMYLASE in the last 8760 hours. No results for input(s): AMMONIA in the last 8760 hours. CBC: Recent Labs    07/08/20 0815 01/05/21 0834  WBC 8.1 9.0  NEUTROABS 4,593 5,319  HGB 13.2 13.1  HCT 39.9 39.1  MCV 91.9 92.4  PLT 295 298   Lipid Panel: Recent Labs  07/08/20 0815 01/05/21 0834  CHOL 230* 249*  HDL 65 61  LDLCALC 136* 161*  TRIG 154* 148  CHOLHDL 3.5 4.1   No results found for: HGBA1C  Procedures since last visit: No results found.  Assessment/Plan Depression, recurrent ([place]) Will await before starting the antidepressant as patient thinks she is just having bad days and would get better She will call me is she feels a need to start Antidepressant Her Daughter agreed H/O alcohol abuse Recent Alcohol Cravings Avoid Alcohol due to past history of Abuse  Can take extra Gabapentin PRN  Cognitive impairment Staying stable so far Able to maintain her apartment with help of her daughter Right ear pain Is going to follow with ENT   Other issues    Hyperlipidemia with target LDL less than 100 Discussed different options Diet and exercise for now    Chronic right shoulder pain Doing well since therapy   . Dizziness Chronic issue    Insomnia, unspecified type Low dose of Neurontin   Elevated TSH Repeat in 6 months    Labs/tests ordered:  * No order type specified * Next  appt:  04/21/2021

## 2021-03-17 DIAGNOSIS — L299 Pruritus, unspecified: Secondary | ICD-10-CM | POA: Diagnosis not present

## 2021-03-17 DIAGNOSIS — H938X3 Other specified disorders of ear, bilateral: Secondary | ICD-10-CM | POA: Diagnosis not present

## 2021-03-20 ENCOUNTER — Encounter: Payer: Self-pay | Admitting: Internal Medicine

## 2021-03-22 ENCOUNTER — Other Ambulatory Visit: Payer: Self-pay

## 2021-03-22 ENCOUNTER — Encounter: Payer: Self-pay | Admitting: Family Medicine

## 2021-03-22 ENCOUNTER — Ambulatory Visit (INDEPENDENT_AMBULATORY_CARE_PROVIDER_SITE_OTHER): Payer: Medicare Other | Admitting: Family Medicine

## 2021-03-22 VITALS — BP 132/70 | HR 66 | Temp 96.8°F | Ht 62.0 in | Wt 156.4 lb

## 2021-03-22 DIAGNOSIS — L309 Dermatitis, unspecified: Secondary | ICD-10-CM

## 2021-03-22 MED ORDER — TRIAMCINOLONE ACETONIDE 0.1 % EX CREA: 1.0000 "application " | TOPICAL_CREAM | Freq: Two times a day (BID) | CUTANEOUS | 0 refills | Status: DC

## 2021-03-22 NOTE — Progress Notes (Signed)
Provider:  Alain Honey, MD  Careteam: Patient Care Team: Virgie Dad, MD as PCP - General (Internal Medicine) Mast, Man X, NP as Nurse Practitioner (Internal Medicine)  PLACE OF SERVICE:  [place]  Advanced Directive information    Allergies  Allergen Reactions   Brandy [Alcohol] Anaphylaxis    Related to grapes   Prune Anaphylaxis    PLUMS,GRAPES,RAISINS   Aspirin Other (See Comments)    Stomach bleed   Augmentin [Amoxicillin-Pot Clavulanate] Hives and Itching   Clindamycin Other (See Comments)    unkown   Omnicef [Cefdinir] Hives   Vinegar [Acetic Acid]     basalmic   Alprazolam Rash   Ceftin [Cefuroxime Axetil] Rash   Doxycycline Rash   Eryc [Erythromycin] Rash   Penicillins Rash    Chief Complaint  Patient presents with   Acute Visit    Patient presents today for a rash on right shoulder for about 3 weeks now.   Quality Metric Gaps    COVID booster     HPI: Patient is a 87 y.o. female patient presents with a rash on her right shoulder and left side of her abdomen at the waist line.  There is some pruritus.  Patient has been using topical Benadryl for itching  Review of Systems:  Review of Systems  Skin:  Positive for rash.  All other systems reviewed and are negative.  Past Medical History:  Diagnosis Date   Allergic rhinitis due to pollen    Anxiety state, unspecified    Arthritis    RIGHT LEG, HANDS.   Cancer (HCC)    FACE/NOSE -SKIN-squamous cell and basal cell.   Disorder of bone and cartilage, unspecified    Lumbago    Lyme borreliosis    Other atopic dermatitis and related conditions    Other dysphagia    Reflux esophagitis    Senile osteoporosis    TMJ dysfunction    MORE ON LEFT "WEARS BITE PROTECTION AT NIGHT"   Past Surgical History:  Procedure Laterality Date   APPENDECTOMY  1950   CATARACT EXTRACTION, BILATERAL Bilateral    EYE SURGERY Right    right eye ptrigium excision x2   HERNIA REPAIR Bilateral    TOTAL  HIP ARTHROPLASTY Right 12/10/2015   Procedure: RIGHT TOTAL HIP ARTHROPLASTY ANTERIOR APPROACH;  Surgeon: [name], MD;  Location: [facility];  Service: Orthopedics;  Laterality: Right;  Spinal to General   Social History:   reports that she has never smoked. She has never used smokeless tobacco. She reports that she does not currently use alcohol. She reports that she does not use drugs.  No family history on file.  Medications: Patient's Medications  New Prescriptions   No medications on file  Previous Medications   ACETAMINOPHEN (TYLENOL) 325 MG TABLET    Take 650 mg by mouth every 6 (six) hours as needed for mild pain.   CHOLECALCIFEROL (VITAMIN D) 1000 UNITS TABLET    Take 1,000 Units by mouth daily.   EPINEPHRINE (EPIPEN 2-PAK) 0.3 MG/0.3 ML IJ SOAJ INJECTION    Inject 0.3 mg into the muscle as needed for anaphylaxis.   FLUTICASONE (FLONASE) 50 MCG/ACT NASAL SPRAY    Place 2 sprays into both nostrils daily.   GABAPENTIN (NEURONTIN) 100 MG CAPSULE    Take 1 capsule (100 mg total) by mouth at bedtime.   GARLIC PO    Take 1 tablet by mouth daily.   LATANOPROST (XALATAN) 0.005 % OPHTHALMIC SOLUTION  Place 1 drop into both eyes at bedtime.   SENNA (SENOKOT) 8.6 MG TABS TABLET    Take 1 tablet by mouth daily as needed for mild constipation.  Modified Medications   No medications on file  Discontinued Medications   No medications on file    Physical Exam:  Vitals:   03/22/21 0856  BP: 132/70  Pulse: 66  Temp: (!) 96.8 F (36 C)  SpO2: 98%  Weight: 156 lb 6.4 oz (70.9 kg)  Height: 5\' 2"  (1.575 m)   Body mass index is 28.61 kg/m. Wt Readings from Last 3 Encounters:  03/22/21 156 lb 6.4 oz (70.9 kg)  02/16/21 158 lb 11.2 oz (72 kg)  01/12/21 158 lb 6.4 oz (71.8 kg)    Physical Exam Vitals and nursing note reviewed.  Constitutional:      Appearance: Normal appearance.  Skin:    Comments: Nonspecific dermatitis on right shoulder and left anterior abdomen.   Daughter shows pictures that looked worse than it does today; in fact it has almost cleared.  In the area on her right shoulder there is a mole/skin tag.  This was removed with sharp dissection with hemostasis with Monsel solution  Neurological:     Mental Status: She is alert.    Labs reviewed: Basic Metabolic Panel: Recent Labs    07/08/20 0815 01/05/21 0834  NA 140 139  K 4.5 4.4  CL 106 105  CO2 22 25  GLUCOSE 84 85  BUN 17 12  CREATININE 0.79 0.74  CALCIUM 9.9 10.0  TSH 4.96* 4.83*   Liver Function Tests: Recent Labs    07/08/20 0815 01/05/21 0834  AST 15 14  ALT 14 11  BILITOT 0.3 0.4  PROT 6.2 6.6   No results for input(s): LIPASE, AMYLASE in the last 8760 hours. No results for input(s): AMMONIA in the last 8760 hours. CBC: Recent Labs    07/08/20 0815 01/05/21 0834  WBC 8.1 9.0  NEUTROABS 4,593 5,319  HGB 13.2 13.1  HCT 39.9 39.1  MCV 91.9 92.4  PLT 295 298   Lipid Panel: Recent Labs    07/08/20 0815 01/05/21 0834  CHOL 230* 249*  HDL 65 61  LDLCALC 136* 161*  TRIG 154* 148  CHOLHDL 3.5 4.1   TSH: Recent Labs    07/08/20 0815 01/05/21 0834  TSH 4.96* 4.83*   A1C: No results found for: HGBA1C   Assessment/Plan  1. Dermatitis due to unknown cause We will treat symptoms with triamcinolone.  Cautioned about use of topical Benadryl as it can be a skin sensitizer - triamcinolone cream (KENALOG) 0.1 %; Apply 1 application topically 2 (two) times daily.  Dispense: 30 g; Refill: 0   Alain Honey, MD [place] [phone]

## 2021-04-21 ENCOUNTER — Other Ambulatory Visit: Payer: Self-pay

## 2021-04-21 DIAGNOSIS — R42 Dizziness and giddiness: Secondary | ICD-10-CM | POA: Diagnosis not present

## 2021-04-21 DIAGNOSIS — E785 Hyperlipidemia, unspecified: Secondary | ICD-10-CM | POA: Diagnosis not present

## 2021-04-22 LAB — CBC WITH DIFFERENTIAL/PLATELET
Absolute Monocytes: 836 cells/uL (ref 200–950)
Basophils Absolute: 74 cells/uL (ref 0–200)
Basophils Relative: 0.9 %
Eosinophils Absolute: 303 cells/uL (ref 15–500)
Eosinophils Relative: 3.7 %
HCT: 40.5 % (ref 35.0–45.0)
Hemoglobin: 13.5 g/dL (ref 11.7–15.5)
Lymphs Abs: 2427 cells/uL (ref 850–3900)
MCH: 30.6 pg (ref 27.0–33.0)
MCHC: 33.3 g/dL (ref 32.0–36.0)
MCV: 91.8 fL (ref 80.0–100.0)
MPV: 11.4 fL (ref 7.5–12.5)
Monocytes Relative: 10.2 %
Neutro Abs: 4559 cells/uL (ref 1500–7800)
Neutrophils Relative %: 55.6 %
Platelets: 308 10*3/uL (ref 140–400)
RBC: 4.41 10*6/uL (ref 3.80–5.10)
RDW: 13 % (ref 11.0–15.0)
Total Lymphocyte: 29.6 %
WBC: 8.2 10*3/uL (ref 3.8–10.8)

## 2021-04-22 LAB — COMPLETE METABOLIC PANEL WITH GFR
AG Ratio: 1.6 (calc) (ref 1.0–2.5)
ALT: 12 U/L (ref 6–29)
AST: 16 U/L (ref 10–35)
Albumin: 4.1 g/dL (ref 3.6–5.1)
Alkaline phosphatase (APISO): 87 U/L (ref 37–153)
BUN: 20 mg/dL (ref 7–25)
CO2: 25 mmol/L (ref 20–32)
Calcium: 9.9 mg/dL (ref 8.6–10.4)
Chloride: 107 mmol/L (ref 98–110)
Creat: 0.83 mg/dL (ref 0.60–0.95)
Globulin: 2.5 g/dL (calc) (ref 1.9–3.7)
Glucose, Bld: 90 mg/dL (ref 65–99)
Potassium: 4.8 mmol/L (ref 3.5–5.3)
Sodium: 139 mmol/L (ref 135–146)
Total Bilirubin: 0.5 mg/dL (ref 0.2–1.2)
Total Protein: 6.6 g/dL (ref 6.1–8.1)
eGFR: 68 mL/min/{1.73_m2} (ref >=60)

## 2021-04-22 LAB — LIPID PANEL
Cholesterol: 273 mg/dL — ABNORMAL HIGH (ref <200)
HDL: 60 mg/dL (ref >=50)
LDL Cholesterol (Calc): 181 mg/dL (calc) — ABNORMAL HIGH
Non-HDL Cholesterol (Calc): 213 mg/dL (calc) — ABNORMAL HIGH (ref <130)
Total CHOL/HDL Ratio: 4.6 (calc) (ref <5.0)
Triglycerides: 171 mg/dL — ABNORMAL HIGH (ref <150)

## 2021-04-22 LAB — TSH: TSH: 7 mIU/L — ABNORMAL HIGH (ref 0.40–4.50)

## 2021-04-27 ENCOUNTER — Non-Acute Institutional Stay: Payer: Medicare Other | Admitting: Internal Medicine

## 2021-04-27 ENCOUNTER — Encounter: Payer: Self-pay | Admitting: Internal Medicine

## 2021-04-27 ENCOUNTER — Other Ambulatory Visit: Payer: Self-pay

## 2021-04-27 VITALS — BP 121/76 | HR 78 | Temp 97.1°F | Ht 62.0 in | Wt 160.6 lb

## 2021-04-27 DIAGNOSIS — M25511 Pain in right shoulder: Secondary | ICD-10-CM | POA: Diagnosis not present

## 2021-04-27 DIAGNOSIS — H9201 Otalgia, right ear: Secondary | ICD-10-CM

## 2021-04-27 DIAGNOSIS — E785 Hyperlipidemia, unspecified: Secondary | ICD-10-CM | POA: Diagnosis not present

## 2021-04-27 DIAGNOSIS — F339 Major depressive disorder, recurrent, unspecified: Secondary | ICD-10-CM | POA: Diagnosis not present

## 2021-04-27 DIAGNOSIS — R4189 Other symptoms and signs involving cognitive functions and awareness: Secondary | ICD-10-CM

## 2021-04-27 DIAGNOSIS — M720 Palmar fascial fibromatosis [Dupuytren]: Secondary | ICD-10-CM | POA: Diagnosis not present

## 2021-04-27 DIAGNOSIS — G4700 Insomnia, unspecified: Secondary | ICD-10-CM

## 2021-04-27 DIAGNOSIS — F1011 Alcohol abuse, in remission: Secondary | ICD-10-CM

## 2021-04-27 DIAGNOSIS — R7989 Other specified abnormal findings of blood chemistry: Secondary | ICD-10-CM

## 2021-04-27 DIAGNOSIS — G8929 Other chronic pain: Secondary | ICD-10-CM

## 2021-04-27 DIAGNOSIS — R42 Dizziness and giddiness: Secondary | ICD-10-CM | POA: Diagnosis not present

## 2021-04-27 NOTE — Progress Notes (Addendum)
Location: [facility] Service:  Clinic (12)  Provider:   Code Status: DNR Goals of Care:  Advanced Directives 02/16/2021  Does Patient Have a Medical Advance Directive? Yes  Type of Paramedic of [place];Out of facility DNR (pink MOST or yellow form)  Does patient want to make changes to medical advance directive? No - Patient declined  Copy of [place] in Chart? Yes - validated most recent copy scanned in chart (See row information)  Would patient like information on creating a medical advance directive? -  Pre-existing out of facility DNR order (yellow form or pink MOST form) Yellow form placed in chart (order not valid for inpatient use)     Chief Complaint  Patient presents with   Medical Management of Chronic Issues    Patient returns to the clinic for her 6 month follow .    HPI: Patient is a 87 y.o. female seen today for medical management of chronic diseases.    Patient has history of hyper lipidemia, arthritis status post right hip arthroplasty,  essential tremors, anxiety and constipation H/o Alcohol Abuse. Quit since 12/20 Also Bilateral Shoulder Pain  Right Ear Pain   Her main Complain today was pain in her Right heel. Especially when she gets up in the morning Gets better as days go by Hard to stand for long time and walk Otherwise continues to be stable No More Alcohol Craving Does have bad days with her mood sometimes but mostly stable Has gained weight . No Diet restriction as she feels she is too old for restrictions  Past Medical History:  Diagnosis Date   Allergic rhinitis due to pollen    Anxiety state, unspecified    Arthritis    RIGHT LEG, HANDS.   Cancer (HCC)    FACE/NOSE -SKIN-squamous cell and basal cell.   Disorder of bone and cartilage, unspecified    Lumbago    Lyme borreliosis    Other atopic dermatitis and related conditions    Other dysphagia    Reflux esophagitis     Senile osteoporosis    TMJ dysfunction    MORE ON LEFT "WEARS BITE PROTECTION AT NIGHT"    Past Surgical History:  Procedure Laterality Date   APPENDECTOMY  1950   CATARACT EXTRACTION, BILATERAL Bilateral    EYE SURGERY Right    right eye ptrigium excision x2   HERNIA REPAIR Bilateral    TOTAL HIP ARTHROPLASTY Right 12/10/2015   Procedure: RIGHT TOTAL HIP ARTHROPLASTY ANTERIOR APPROACH;  Surgeon: [name], MD;  Location: [facility];  Service: Orthopedics;  Laterality: Right;  Spinal to General    Allergies  Allergen Reactions   Brandy [Alcohol] Anaphylaxis    Related to grapes   Prune Anaphylaxis    PLUMS,GRAPES,RAISINS   Aspirin Other (See Comments)    Stomach bleed   Augmentin [Amoxicillin-Pot Clavulanate] Hives and Itching   Clindamycin Other (See Comments)    unkown   Omnicef [Cefdinir] Hives   Vinegar [Acetic Acid]     basalmic   Alprazolam Rash   Ceftin [Cefuroxime Axetil] Rash   Doxycycline Rash   Eryc [Erythromycin] Rash   Penicillins Rash    Outpatient Encounter Medications as of 04/27/2021  Medication Sig   acetaminophen (TYLENOL) 325 MG tablet Take 650 mg by mouth every 6 (six) hours as needed for mild pain.   cholecalciferol (VITAMIN D) 1000 UNITS tablet Take 1,000 Units by mouth daily.   EPINEPHrine (EPIPEN  2-PAK) 0.3 mg/0.3 mL IJ SOAJ injection Inject 0.3 mg into the muscle as needed for anaphylaxis.   fluticasone (FLONASE) 50 MCG/ACT nasal spray Place 2 sprays into both nostrils daily.   gabapentin (NEURONTIN) 100 MG capsule Take 1 capsule (100 mg total) by mouth at bedtime.   GARLIC PO Take 1 tablet by mouth daily.   latanoprost (XALATAN) 0.005 % ophthalmic solution Place 1 drop into both eyes at bedtime.   senna (SENOKOT) 8.6 MG TABS tablet Take 1 tablet by mouth daily as needed for mild constipation.   triamcinolone cream (KENALOG) 0.1 % Apply 1 application topically 2 (two) times daily.   No facility-administered encounter medications on  file as of 04/27/2021.    Review of Systems:  Review of Systems  Constitutional:  Negative for activity change and appetite change.  HENT: Negative.    Respiratory:  Negative for cough and shortness of breath.   Cardiovascular:  Negative for leg swelling.  Gastrointestinal:  Negative for constipation.  Genitourinary: Negative.   Musculoskeletal:  Negative for arthralgias, gait problem and myalgias.  Skin: Negative.   Neurological:  Negative for dizziness and weakness.  Psychiatric/Behavioral:  Positive for confusion. Negative for dysphoric mood and sleep disturbance.    Health Maintenance  Topic Date Due   COVID-19 Vaccine (5 - Booster for Moderna series) 12/01/2020   TETANUS/TDAP  05/03/2025   Pneumonia Vaccine 35+ Years old  Completed   INFLUENZA VACCINE  Completed   DEXA SCAN  Completed   Zoster Vaccines- Shingrix  Completed   HPV VACCINES  Aged Out    Physical Exam: Vitals:   04/27/21 1455  BP: 121/76  Pulse: 78  Temp: (!) 97.1 F (36.2 C)  SpO2: 99%  Weight: 160 lb 9.6 oz (72.8 kg)  Height: 5\' 2"  (1.575 m)   Body mass index is 29.37 kg/m. Physical Exam Vitals reviewed.  Constitutional:      Appearance: Normal appearance.  HENT:     Head: Normocephalic.     Nose: Nose normal.     Mouth/Throat:     Mouth: Mucous membranes are moist.     Pharynx: Oropharynx is clear.  Eyes:     Pupils: Pupils are equal, round, and reactive to light.  Cardiovascular:     Rate and Rhythm: Normal rate and regular rhythm.     Pulses: Normal pulses.     Heart sounds: Normal heart sounds. No murmur heard. Pulmonary:     Effort: Pulmonary effort is normal.     Breath sounds: Normal breath sounds.  Abdominal:     General: Abdomen is flat. Bowel sounds are normal.     Palpations: Abdomen is soft.  Musculoskeletal:        General: No swelling.     Cervical back: Neck supple.  Skin:    General: Skin is warm.  Neurological:     General: No focal deficit present.     Mental  Status: She is alert and oriented to person, place, and time.  Psychiatric:        Mood and Affect: Mood normal.        Thought Content: Thought content normal.   MMSE - Mini Mental State Exam 07/14/2020 05/14/2019 11/28/2017  Orientation to time 5 5 5   Orientation to Place 5 5 5   Registration 3 3 3   Attention/ Calculation 1 1 5   Recall 1 2 2   Language- name 2 objects 2 2 2   Language- repeat 1 1 1   Language- follow 3 step  command 3 3 3   Language- read & follow direction 1 1 1   Write a sentence 1 1 1   Copy design 1 1 1   Total score 24 25 29     Labs reviewed: Basic Metabolic Panel: Recent Labs    07/08/20 0815 01/05/21 0834 04/21/21 0800  NA 140 139 139  K 4.5 4.4 4.8  CL 106 105 107  CO2 22 25 25   GLUCOSE 84 85 90  BUN 17 12 20   CREATININE 0.79 0.74 0.83  CALCIUM 9.9 10.0 9.9  TSH 4.96* 4.83* 7.00*   Liver Function Tests: Recent Labs    07/08/20 0815 01/05/21 0834 04/21/21 0800  AST 15 14 16   ALT 14 11 12   BILITOT 0.3 0.4 0.5  PROT 6.2 6.6 6.6   No results for input(s): LIPASE, AMYLASE in the last 8760 hours. No results for input(s): AMMONIA in the last 8760 hours. CBC: Recent Labs    07/08/20 0815 01/05/21 0834 04/21/21 0800  WBC 8.1 9.0 8.2  NEUTROABS 4,593 5,319 4,559  HGB 13.2 13.1 13.5  HCT 39.9 39.1 40.5  MCV 91.9 92.4 91.8  PLT 295 298 308   Lipid Panel: Recent Labs    07/08/20 0815 01/05/21 0834 04/21/21 0800  CHOL 230* 249* 273*  HDL 65 61 60  LDLCALC 136* 161* 181*  TRIG 154* 148 171*  CHOLHDL 3.5 4.1 4.6   No results found for: HGBA1C  Procedures since last visit: No results found.  Assessment/Plan  1. Palmar fasciitis  - Ambulatory referral to Podiatry  2. Elevated TSH Will Repeat TSH Has no Obvious Symptoms Has gained weight and LDL is high  3. Depression, recurrent ([place]) Symptoms stable No Antidepressant right now   4. H/O alcohol abuse Quit in 2020  5. Cognitive impairment Mild Cognitive impairment Able to  stay in her apartment with help of her Daughter  42. Hyperlipidemia  LDL now going up Refuses to restrict diet or Statin at this time  7. Chronic right shoulder pain No Complains   8. Right ear pain Doing well with Prn Ear drops per ENT  9. Dizziness Says her symptoms are better  10. Insomnia, unspecified type Neurontin   Labs/tests ordered:  * No order type specified * Next appt:  Visit date not found

## 2021-05-10 ENCOUNTER — Ambulatory Visit (INDEPENDENT_AMBULATORY_CARE_PROVIDER_SITE_OTHER): Payer: Medicare Other | Admitting: Podiatry

## 2021-05-10 ENCOUNTER — Other Ambulatory Visit: Payer: Self-pay

## 2021-05-10 ENCOUNTER — Ambulatory Visit (INDEPENDENT_AMBULATORY_CARE_PROVIDER_SITE_OTHER): Payer: Medicare Other

## 2021-05-10 DIAGNOSIS — M79672 Pain in left foot: Secondary | ICD-10-CM

## 2021-05-10 DIAGNOSIS — M722 Plantar fascial fibromatosis: Secondary | ICD-10-CM | POA: Diagnosis not present

## 2021-05-10 NOTE — Patient Instructions (Signed)
For instructions on how to put on your Plantar Fascial Brace, please visit [email or web address]   Plantar Fasciitis (Heel Spur Syndrome) with Rehab The plantar fascia is a fibrous, ligament-like, soft-tissue structure that spans the bottom of the foot. Plantar fasciitis is a condition that causes pain in the foot due to inflammation of the tissue. SYMPTOMS   Pain and tenderness on the underneath side of the foot.  Pain that worsens with standing or walking. CAUSES  Plantar fasciitis is caused by irritation and injury to the plantar fascia on the underneath side of the foot. Common mechanisms of injury include:  Direct trauma to bottom of the foot.  Damage to a small nerve that runs under the foot where the main fascia attaches to the heel bone.  Stress placed on the plantar fascia due to bone spurs. RISK INCREASES WITH:   Activities that place stress on the plantar fascia (running, jumping, pivoting, or cutting).  Poor strength and flexibility.  Improperly fitted shoes.  Tight calf muscles.  Flat feet.  Failure to warm-up properly before activity.  Obesity. PREVENTION  Warm up and stretch properly before activity.  Allow for adequate recovery between workouts.  Maintain physical fitness:  Strength, flexibility, and endurance.  Cardiovascular fitness.  Maintain a health body weight.  Avoid stress on the plantar fascia.  Wear properly fitted shoes, including arch supports for individuals who have flat feet.  PROGNOSIS  If treated properly, then the symptoms of plantar fasciitis usually resolve without surgery. However, occasionally surgery is necessary.  RELATED COMPLICATIONS   Recurrent symptoms that may result in a chronic condition.  Problems of the lower back that are caused by compensating for the injury, such as limping.  Pain or weakness of the foot during push-off following surgery.  Chronic inflammation, scarring, and partial or complete  fascia tear, occurring more often from repeated injections.  TREATMENT  Treatment initially involves the use of ice and medication to help reduce pain and inflammation. The use of strengthening and stretching exercises may help reduce pain with activity, especially stretches of the Achilles tendon. These exercises may be performed at home or with a therapist. Your caregiver may recommend that you use heel cups of arch supports to help reduce stress on the plantar fascia. Occasionally, corticosteroid injections are given to reduce inflammation. If symptoms persist for greater than 6 months despite non-surgical (conservative), then surgery may be recommended.   MEDICATION   If pain medication is necessary, then nonsteroidal anti-inflammatory medications, such as aspirin and ibuprofen, or other minor pain relievers, such as acetaminophen, are often recommended.  Do not take pain medication within 7 days before surgery.  Prescription pain relievers may be given if deemed necessary by your caregiver. Use only as directed and only as much as you need.  Corticosteroid injections may be given by your caregiver. These injections should be reserved for the most serious cases, because they may only be given a certain number of times.  HEAT AND COLD  Cold treatment (icing) relieves pain and reduces inflammation. Cold treatment should be applied for 10 to 15 minutes every 2 to 3 hours for inflammation and pain and immediately after any activity that aggravates your symptoms. Use ice packs or massage the area with a piece of ice (ice massage).  Heat treatment may be used prior to performing the stretching and strengthening activities prescribed by your caregiver, physical therapist, or athletic trainer. Use a heat pack or soak the injury in warm water.  SEEK IMMEDIATE MEDICAL   CARE IF:  Treatment seems to offer no benefit, or the condition worsens.  Any medications produce adverse side effects.   EXERCISES- RANGE OF MOTION (ROM) AND STRETCHING EXERCISES - Plantar Fasciitis (Heel Spur Syndrome) These exercises may help you when beginning to rehabilitate your injury. Your symptoms may resolve with or without further involvement from your physician, physical therapist or athletic trainer. While completing these exercises, remember:   Restoring tissue flexibility helps normal motion to return to the joints. This allows healthier, less painful movement and activity.  An effective stretch should be held for at least 30 seconds.  A stretch should never be painful. You should only feel a gentle lengthening or release in the stretched tissue.  RANGE OF MOTION - Toe Extension, Flexion  Sit with your right / left leg crossed over your opposite knee.  Grasp your toes and gently pull them back toward the top of your foot. You should feel a stretch on the bottom of your toes and/or foot.  Hold this stretch for 10 seconds.  Now, gently pull your toes toward the bottom of your foot. You should feel a stretch on the top of your toes and or foot.  Hold this stretch for 10 seconds. Repeat  times. Complete this stretch 3 times per day.   RANGE OF MOTION - Ankle Dorsiflexion, Active Assisted  Remove shoes and sit on a chair that is preferably not on a carpeted surface.  Place right / left foot under knee. Extend your opposite leg for support.  Keeping your heel down, slide your right / left foot back toward the chair until you feel a stretch at your ankle or calf. If you do not feel a stretch, slide your bottom forward to the edge of the chair, while still keeping your heel down.  Hold this stretch for 10 seconds. Repeat 3 times. Complete this stretch 2 times per day.   STRETCH  Gastroc, Standing  Place hands on wall.  Extend right / left leg, keeping the front knee somewhat bent.  Slightly point your toes inward on your back foot.  Keeping your right / left heel on the floor and your  knee straight, shift your weight toward the wall, not allowing your back to arch.  You should feel a gentle stretch in the right / left calf. Hold this position for 10 seconds. Repeat 3 times. Complete this stretch 2 times per day.  STRETCH  Soleus, Standing  Place hands on wall.  Extend right / left leg, keeping the other knee somewhat bent.  Slightly point your toes inward on your back foot.  Keep your right / left heel on the floor, bend your back knee, and slightly shift your weight over the back leg so that you feel a gentle stretch deep in your back calf.  Hold this position for 10 seconds. Repeat 3 times. Complete this stretch 2 times per day.  STRETCH  Gastrocsoleus, Standing  Note: This exercise can place a lot of stress on your foot and ankle. Please complete this exercise only if specifically instructed by your caregiver.   Place the ball of your right / left foot on a step, keeping your other foot firmly on the same step.  Hold on to the wall or a rail for balance.  Slowly lift your other foot, allowing your body weight to press your heel down over the edge of the step.  You should feel a stretch in your right / left calf.  Hold this   position for 10 seconds.  Repeat this exercise with a slight bend in your right / left knee. Repeat 3 times. Complete this stretch 2 times per day.   STRENGTHENING EXERCISES - Plantar Fasciitis (Heel Spur Syndrome)  These exercises may help you when beginning to rehabilitate your injury. They may resolve your symptoms with or without further involvement from your physician, physical therapist or athletic trainer. While completing these exercises, remember:   Muscles can gain both the endurance and the strength needed for everyday activities through controlled exercises.  Complete these exercises as instructed by your physician, physical therapist or athletic trainer. Progress the resistance and repetitions only as guided.  STRENGTH -  Towel Curls  Sit in a chair positioned on a non-carpeted surface.  Place your foot on a towel, keeping your heel on the floor.  Pull the towel toward your heel by only curling your toes. Keep your heel on the floor. Repeat 3 times. Complete this exercise 2 times per day.  STRENGTH - Ankle Inversion  Secure one end of a rubber exercise band/tubing to a fixed object (table, pole). Loop the other end around your foot just before your toes.  Place your fists between your knees. This will focus your strengthening at your ankle.  Slowly, pull your big toe up and in, making sure the band/tubing is positioned to resist the entire motion.  Hold this position for 10 seconds.  Have your muscles resist the band/tubing as it slowly pulls your foot back to the starting position. Repeat 3 times. Complete this exercises 2 times per day.  Document Released: 04/17/2005 Document Revised: 07/10/2011 Document Reviewed: 07/30/2008 [employer] Patient Information 2014 [employer]. 

## 2021-05-15 NOTE — Progress Notes (Signed)
Subjective:   Patient ID: Rachel Morris, female   DOB: 88 y.o.   MRN: [id number]   HPI 88 year old female presents the office today for concerns of left foot pain mostly in the arch as well as the heel.  She says most the pain in the morning when she first gets up but is becoming more consistent all day.  Started a month ago.  Denies any recent injury or any treatment.  She does have orthotics but she does not feel that they are raising the arch of been off.  No other concerns.   Review of Systems  All other systems reviewed and are negative.  Past Medical History:  Diagnosis Date   Allergic rhinitis due to pollen    Anxiety state, unspecified    Arthritis    RIGHT LEG, HANDS.   Cancer (HCC)    FACE/NOSE -SKIN-squamous cell and basal cell.   Disorder of bone and cartilage, unspecified    Lumbago    Lyme borreliosis    Other atopic dermatitis and related conditions    Other dysphagia    Reflux esophagitis    Senile osteoporosis    TMJ dysfunction    MORE ON LEFT "WEARS BITE PROTECTION AT NIGHT"    Past Surgical History:  Procedure Laterality Date   APPENDECTOMY  1950   CATARACT EXTRACTION, BILATERAL Bilateral    EYE SURGERY Right    right eye ptrigium excision x2   HERNIA REPAIR Bilateral    TOTAL HIP ARTHROPLASTY Right 12/10/2015   Procedure: RIGHT TOTAL HIP ARTHROPLASTY ANTERIOR APPROACH;  Surgeon: [name], MD;  Location: [facility];  Service: Orthopedics;  Laterality: Right;  Spinal to General     Current Outpatient Medications:    acetaminophen (TYLENOL) 325 MG tablet, Take 650 mg by mouth every 6 (six) hours as needed for mild pain., Disp: , Rfl:    cholecalciferol (VITAMIN D) 1000 UNITS tablet, Take 1,000 Units by mouth daily., Disp: , Rfl:    EPINEPHrine (EPIPEN 2-PAK) 0.3 mg/0.3 mL IJ SOAJ injection, Inject 0.3 mg into the muscle as needed for anaphylaxis., Disp: 1 each, Rfl: 1   fluticasone (FLONASE) 50 MCG/ACT nasal spray, Place 2 sprays into both  nostrils daily., Disp: , Rfl:    gabapentin (NEURONTIN) 100 MG capsule, Take 1 capsule (100 mg total) by mouth at bedtime., Disp: 90 capsule, Rfl: 3   GARLIC PO, Take 1 tablet by mouth daily., Disp: , Rfl:    latanoprost (XALATAN) 0.005 % ophthalmic solution, Place 1 drop into both eyes at bedtime., Disp: , Rfl:    senna (SENOKOT) 8.6 MG TABS tablet, Take 1 tablet by mouth daily as needed for mild constipation., Disp: , Rfl:    triamcinolone cream (KENALOG) 0.1 %, Apply 1 application topically 2 (two) times daily., Disp: 30 g, Rfl: 0  Allergies  Allergen Reactions   Brandy [Alcohol] Anaphylaxis    Related to grapes   Prune Anaphylaxis    PLUMS,GRAPES,RAISINS   Aspirin Other (See Comments)    Stomach bleed   Augmentin [Amoxicillin-Pot Clavulanate] Hives and Itching   Clindamycin Other (See Comments)    unkown   Omnicef [Cefdinir] Hives   Vinegar [Acetic Acid]     basalmic   Alprazolam Rash   Ceftin [Cefuroxime Axetil] Rash   Doxycycline Rash   Eryc [Erythromycin] Rash   Penicillins Rash          Objective:  Physical Exam  General: AAO x3, NAD  Dermatological: Skin is warm, dry  and supple bilateral.  There are no open sores, no preulcerative lesions, no rash or signs of infection present.  Vascular: Dorsalis Pedis artery and Posterior Tibial artery pedal pulses are 2/4 bilateral with immedate capillary fill time. There is no pain with calf compression, swelling, warmth, erythema.   Neruologic: Grossly intact via light touch bilateral.  Negative Tinel sign.  Musculoskeletal: There is tenderness palpation on the plantar medial tubercle of the calcaneus at the insertion of the plantar fascia on the left side and she has discomfort in the arch of the foot as well.  There is no area of pinpoint tenderness.  No significant edema.  Flexor, extensor tendons appear to be intact.  Muscular strength 5/5 in all groups tested bilateral.  Gait: Unassisted, Nonantalgic.        Assessment:   Left foot pain, Plantar fasciitis     Plan:  -Treatment options discussed including all alternatives, risks, and complications -Etiology of symptoms were discussed -X-rays were obtained and reviewed with the patient.  There is no evidence of acute fracture or stress fracture identified today. -She already has orthotics and we did discuss getting a new pair.  In the meantime I added a metatarsal pad to the arch to see if this will increase the arch.  Also dispensed plantar fascial brace.  Discussed stretching, icing daily.  Discussed using topical medication such as Voltaren gel as needed.  Trula Slade DPM

## 2021-06-13 ENCOUNTER — Encounter: Payer: Medicare Other | Admitting: Family

## 2021-06-15 ENCOUNTER — Other Ambulatory Visit: Payer: Self-pay

## 2021-06-15 ENCOUNTER — Non-Acute Institutional Stay: Payer: Medicare Other | Admitting: Family Medicine

## 2021-06-15 ENCOUNTER — Encounter: Payer: Self-pay | Admitting: Family Medicine

## 2021-06-15 DIAGNOSIS — H6520 Chronic serous otitis media, unspecified ear: Secondary | ICD-10-CM | POA: Insufficient documentation

## 2021-06-15 DIAGNOSIS — H6521 Chronic serous otitis media, right ear: Secondary | ICD-10-CM | POA: Diagnosis not present

## 2021-06-15 NOTE — Progress Notes (Signed)
Provider:  Alain Honey, MD  Careteam: Patient Care Team: Virgie Dad, MD as PCP - General (Internal Medicine) Mast, Man X, NP as Nurse Practitioner (Internal Medicine)  PLACE OF SERVICE:  [place]  Advanced Directive information    Allergies  Allergen Reactions   Brandy [Alcohol] Anaphylaxis    Related to grapes   Prune Anaphylaxis    PLUMS,GRAPES,RAISINS   Aspirin Other (See Comments)    Stomach bleed   Augmentin [Amoxicillin-Pot Clavulanate] Hives and Itching   Clindamycin Other (See Comments)    unkown   Omnicef [Cefdinir] Hives   Vinegar [Acetic Acid]     basalmic   Alprazolam Rash   Ceftin [Cefuroxime Axetil] Rash   Doxycycline Rash   Eryc [Erythromycin] Rash   Penicillins Rash    No chief complaint on file.    HPI: Patient is a 88 y.o. female C/O R ear pressure. Hx ear infections but more recently external otitis. No real pain, but fullness. Endorses allergy sx this time of year.  Review of Systems:  Review of Systems  HENT:  Positive for ear pain.   All other systems reviewed and are negative.  Past Medical History:  Diagnosis Date   Allergic rhinitis due to pollen    Anxiety state, unspecified    Arthritis    RIGHT LEG, HANDS.   Cancer (HCC)    FACE/NOSE -SKIN-squamous cell and basal cell.   Disorder of bone and cartilage, unspecified    Lumbago    Lyme borreliosis    Other atopic dermatitis and related conditions    Other dysphagia    Reflux esophagitis    Senile osteoporosis    TMJ dysfunction    MORE ON LEFT "WEARS BITE PROTECTION AT NIGHT"   Past Surgical History:  Procedure Laterality Date   APPENDECTOMY  1950   CATARACT EXTRACTION, BILATERAL Bilateral    EYE SURGERY Right    right eye ptrigium excision x2   HERNIA REPAIR Bilateral    TOTAL HIP ARTHROPLASTY Right 12/10/2015   Procedure: RIGHT TOTAL HIP ARTHROPLASTY ANTERIOR APPROACH;  Surgeon: [name], MD;  Location: [facility];  Service: Orthopedics;   Laterality: Right;  Spinal to General   Social History:   reports that she has never smoked. She has never used smokeless tobacco. She reports that she does not currently use alcohol. She reports that she does not use drugs.  No family history on file.  Medications: Patient's Medications  New Prescriptions   No medications on file  Previous Medications   ACETAMINOPHEN (TYLENOL) 325 MG TABLET    Take 650 mg by mouth every 6 (six) hours as needed for mild pain.   CHOLECALCIFEROL (VITAMIN D) 1000 UNITS TABLET    Take 1,000 Units by mouth daily.   EPINEPHRINE (EPIPEN 2-PAK) 0.3 MG/0.3 ML IJ SOAJ INJECTION    Inject 0.3 mg into the muscle as needed for anaphylaxis.   FLUTICASONE (FLONASE) 50 MCG/ACT NASAL SPRAY    Place 2 sprays into both nostrils daily.   GABAPENTIN (NEURONTIN) 100 MG CAPSULE    Take 1 capsule (100 mg total) by mouth at bedtime.   GARLIC PO    Take 1 tablet by mouth daily.   LATANOPROST (XALATAN) 0.005 % OPHTHALMIC SOLUTION    Place 1 drop into both eyes at bedtime.   SENNA (SENOKOT) 8.6 MG TABS TABLET    Take 1 tablet by mouth daily as needed for mild constipation.   TRIAMCINOLONE CREAM (KENALOG) 0.1 %  Apply 1 application topically 2 (two) times daily.  Modified Medications   No medications on file  Discontinued Medications   No medications on file    Physical Exam:  There were no vitals filed for this visit. There is no height or weight on file to calculate BMI. Wt Readings from Last 3 Encounters:  04/27/21 160 lb 9.6 oz (72.8 kg)  03/22/21 156 lb 6.4 oz (70.9 kg)  02/16/21 158 lb 11.2 oz (72 kg)    Physical Exam Vitals and nursing note reviewed.  Constitutional:      Appearance: Normal appearance.  HENT:     Ears:     Comments: Right ear: EAC's normal TM is dull and does not move with Valsalva maneuver Cardiovascular:     Rate and Rhythm: Normal rate.  Pulmonary:     Effort: Pulmonary effort is normal.  Neurological:     General: No focal deficit  present.     Mental Status: She is alert and oriented to person, place, and time.    Labs reviewed: Basic Metabolic Panel: Recent Labs    07/08/20 0815 01/05/21 0834 04/21/21 0800  NA 140 139 139  K 4.5 4.4 4.8  CL 106 105 107  CO2 22 25 25   GLUCOSE 84 85 90  BUN 17 12 20   CREATININE 0.79 0.74 0.83  CALCIUM 9.9 10.0 9.9  TSH 4.96* 4.83* 7.00*   Liver Function Tests: Recent Labs    07/08/20 0815 01/05/21 0834 04/21/21 0800  AST 15 14 16   ALT 14 11 12   BILITOT 0.3 0.4 0.5  PROT 6.2 6.6 6.6   No results for input(s): LIPASE, AMYLASE in the last 8760 hours. No results for input(s): AMMONIA in the last 8760 hours. CBC: Recent Labs    07/08/20 0815 01/05/21 0834 04/21/21 0800  WBC 8.1 9.0 8.2  NEUTROABS 4,593 5,319 4,559  HGB 13.2 13.1 13.5  HCT 39.9 39.1 40.5  MCV 91.9 92.4 91.8  PLT 295 298 308   Lipid Panel: Recent Labs    07/08/20 0815 01/05/21 0834 04/21/21 0800  CHOL 230* 249* 273*  HDL 65 61 60  LDLCALC 136* 161* 181*  TRIG 154* 148 171*  CHOLHDL 3.5 4.1 4.6   TSH: Recent Labs    07/08/20 0815 01/05/21 0834 04/21/21 0800  TSH 4.96* 4.83* 7.00*   A1C: No results found for: HGBA1C   Assessment/Plan  1. Right chronic serous otitis media We will treat with topical nasal steroid and ear exercises or Valsalva maneuver 5-6 times per day.  If, over the next 2 to 3 days discomfort is getting worse would add antibiotic.  I have spoken to pharmacy and they have clindamycin 300 mg on hold for worsening ear pain   Alain Honey, MD [place] [phone]

## 2021-06-15 NOTE — Patient Instructions (Addendum)
Do ear exercises 5- 6 times a day. Put up antibiotic if getting worse over the next couple of days.

## 2021-07-13 ENCOUNTER — Other Ambulatory Visit: Payer: Self-pay | Admitting: Internal Medicine

## 2021-07-21 DIAGNOSIS — H40053 Ocular hypertension, bilateral: Secondary | ICD-10-CM | POA: Diagnosis not present

## 2021-08-16 ENCOUNTER — Ambulatory Visit (INDEPENDENT_AMBULATORY_CARE_PROVIDER_SITE_OTHER): Payer: Medicare Other | Admitting: Podiatry

## 2021-08-16 DIAGNOSIS — M722 Plantar fascial fibromatosis: Secondary | ICD-10-CM | POA: Diagnosis not present

## 2021-08-16 DIAGNOSIS — M79672 Pain in left foot: Secondary | ICD-10-CM

## 2021-08-16 NOTE — Patient Instructions (Addendum)
While at your visit today you received a steroid injection in your foot or ankle to help with your pain. Along with having the steroid medication there is some "numbing" medication in the shot that you received. Due to this you may notice some numbness to the area for the next couple of hours.  ? ?I would recommend limiting activity for the next few days to help the steroid injection take affect.  ?  ?The actually benefit from the steroid injection may take up to 2-7 days to see a difference. You may actually experience a small (as in 10%) INCREASE in pain in the first 24 hours---that is common. It would be best if you can ice the area today and take anti-inflammatory medications (such as Ibuprofen, Motrin, or Aleve) if you are able to take these medications. If you were prescribed another medication to help with the pain go ahead and start that medication today  ?  ?Things to watch out for that you should contact us or a health care provider urgently would include: ?1. Unusual (as in more than 10%) increase in pain ?2. New fever > 101.5 ?3. New swelling or redness of the injected area.  ?4. Streaking of red lines around the area injected. ? ?If you have any questions or concerns about this, please give our office a call at [phone].  ? ? ?Plantar Fasciitis (Heel Spur Syndrome) ?with Rehab ?The plantar fascia is a fibrous, ligament-like, soft-tissue structure that spans the bottom of the foot. Plantar fasciitis is a condition that causes pain in the foot due to inflammation of the tissue. ?SYMPTOMS  ?Pain and tenderness on the underneath side of the foot. ?Pain that worsens with standing or walking. ?CAUSES  ?Plantar fasciitis is caused by irritation and injury to the plantar fascia on the underneath side of the foot. Common mechanisms of injury include: ?Direct trauma to bottom of the foot. ?Damage to a small nerve that runs under the foot where the main fascia attaches to the heel bone. ?Stress placed on the  plantar fascia due to bone spurs. ?RISK INCREASES WITH:  ?Activities that place stress on the plantar fascia (running, jumping, pivoting, or cutting). ?Poor strength and flexibility. ?Improperly fitted shoes. ?Tight calf muscles. ?Flat feet. ?Failure to warm-up properly before activity. ?Obesity. ?PREVENTION ?Warm up and stretch properly before activity. ?Allow for adequate recovery between workouts. ?Maintain physical fitness: ?Strength, flexibility, and endurance. ?Cardiovascular fitness. ?Maintain a health body weight. ?Avoid stress on the plantar fascia. ?Wear properly fitted shoes, including arch supports for individuals who have flat feet. ? ?PROGNOSIS  ?If treated properly, then the symptoms of plantar fasciitis usually resolve without surgery. However, occasionally surgery is necessary. ? ?RELATED COMPLICATIONS  ?Recurrent symptoms that may result in a chronic condition. ?Problems of the lower back that are caused by compensating for the injury, such as limping. ?Pain or weakness of the foot during push-off following surgery. ?Chronic inflammation, scarring, and partial or complete fascia tear, occurring more often from repeated injections. ? ?TREATMENT  ?Treatment initially involves the use of ice and medication to help reduce pain and inflammation. The use of strengthening and stretching exercises may help reduce pain with activity, especially stretches of the Achilles tendon. These exercises may be performed at home or with a therapist. Your caregiver may recommend that you use heel cups of arch supports to help reduce stress on the plantar fascia. Occasionally, corticosteroid injections are given to reduce inflammation. If symptoms persist for greater than 6 months despite  non-surgical (conservative), then surgery may be recommended.  ? ?MEDICATION  ?If pain medication is necessary, then nonsteroidal anti-inflammatory medications, such as aspirin and ibuprofen, or other minor pain relievers, such as  acetaminophen, are often recommended. ?Do not take pain medication within 7 days before surgery. ?Prescription pain relievers may be given if deemed necessary by your caregiver. Use only as directed and only as much as you need. ?Corticosteroid injections may be given by your caregiver. These injections should be reserved for the most serious cases, because they may only be given a certain number of times. ? ?HEAT AND COLD ?Cold treatment (icing) relieves pain and reduces inflammation. Cold treatment should be applied for 10 to 15 minutes every 2 to 3 hours for inflammation and pain and immediately after any activity that aggravates your symptoms. Use ice packs or massage the area with a piece of ice (ice massage). ?Heat treatment may be used prior to performing the stretching and strengthening activities prescribed by your caregiver, physical therapist, or athletic trainer. Use a heat pack or soak the injury in warm water. ? ?SEEK IMMEDIATE MEDICAL CARE IF: ?Treatment seems to offer no benefit, or the condition worsens. ?Any medications produce adverse side effects. ? ?EXERCISES- ?RANGE OF MOTION (ROM) AND STRETCHING EXERCISES - Plantar Fasciitis (Heel Spur Syndrome) ?These exercises may help you when beginning to rehabilitate your injury. Your symptoms may resolve with or without further involvement from your physician, physical therapist or athletic trainer. While completing these exercises, remember:  ?Restoring tissue flexibility helps normal motion to return to the joints. This allows healthier, less painful movement and activity. ?An effective stretch should be held for at least 30 seconds. ?A stretch should never be painful. You should only feel a gentle lengthening or release in the stretched tissue. ? ?RANGE OF MOTION - Toe Extension, Flexion ?Sit with your right / left leg crossed over your opposite knee. ?Grasp your toes and gently pull them back toward the top of your foot. You should feel a stretch on  the bottom of your toes and/or foot. ?Hold this stretch for 10 seconds. ?Now, gently pull your toes toward the bottom of your foot. You should feel a stretch on the top of your toes and or foot. ?Hold this stretch for 10 seconds. ?Repeat  times. Complete this stretch 3 times per day.  ? ?RANGE OF MOTION - Ankle Dorsiflexion, Active Assisted ?Remove shoes and sit on a chair that is preferably not on a carpeted surface. ?Place right / left foot under knee. Extend your opposite leg for support. ?Keeping your heel down, slide your right / left foot back toward the chair until you feel a stretch at your ankle or calf. If you do not feel a stretch, slide your bottom forward to the edge of the chair, while still keeping your heel down. ?Hold this stretch for 10 seconds. ?Repeat 3 times. Complete this stretch 2 times per day.  ? ?STRETCH  G[employer], Standing ?Place hands on wall. ?Extend right / left leg, keeping the front knee somewhat bent. ?Slightly point your toes inward on your back foot. ?Keeping your right / left heel on the floor and your knee straight, shift your weight toward the wall, not allowing your back to arch. ?You should feel a gentle stretch in the right / left calf. Hold this position for 10 seconds. ?Repeat 3 times. Complete this stretch 2 times per day. ? ?STRETCH  Sole[employer], Standing ?Place hands on wall. ?Extend right / left leg, keeping  the other knee somewhat bent. ?Slightly point your toes inward on your back foot. ?Keep your right / left heel on the floor, bend your back knee, and slightly shift your weight over the back leg so that you feel a gentle stretch deep in your back calf. ?Hold this position for 10 seconds. ?Repeat 3 times. Complete this stretch 2 times per day. ? ?[employer], Standing  ?Note: This exercise can place a lot of stress on your foot and ankle. Please complete this exercise only if specifically instructed by your caregiver.  ?Place the ball of your right / left foot  on a step, keeping your other foot firmly on the same step. ?Hold on to the wall or a rail for balance. ?Slowly lift your other foot, allowing your body weight to press your heel down over the edge of the s

## 2021-08-20 DIAGNOSIS — M722 Plantar fascial fibromatosis: Secondary | ICD-10-CM | POA: Insufficient documentation

## 2021-08-20 NOTE — Progress Notes (Signed)
Subjective: ?88 year old female presents the office today for follow evaluation of left foot pain, Plantar fasciitis.  She states that she still gets discomfort of the heel as well as the arch.  No recent injury or changes otherwise.  She is been trying some stretching at home as well as using the brace but feels it may be aggravating her foot. ? ?Objective: ?AAO x3, NAD ?DP/PT pulses palpable bilaterally, CRT less than 3 seconds ?There is tenderness palpation along the plantar medial tubercle of the calcaneus at the insertion of plantar fascia on the left side.  Mild discomfort the arch of the foot.  There is no area pinpoint tenderness.  No edema, erythema.  Flexor, extensor tendons appear to be intact.  MMT 5/5.  Negative Tinel sign.   ?No pain with calf compression, swelling, warmth, erythema ? ?Assessment: ?Left foot pain, Plantar fasciitis ? ?Plan: ?-All treatment options discussed with the patient including all alternatives, risks, complications.  ?-Discussed steroid injection.  She wants to proceed with this.  See procedure note below.  Continue stretching, icing daily.  I showed her different stretching exercises to be done.  Discussed trying to use the plantar fascia brace but not as tight.  Continue shoes and good arch supports. ?-Patient encouraged to call the office with any questions, concerns, change in symptoms.  ? ?Procedure: Injection Tendon/Ligament ?Discussed alternatives, risks, complications and verbal consent was obtained.  ?Location: Left plantar fascia at the glabrous junction; medial approach. ?Skin Prep: Alcohol. ?Injectate: 0.5cc 0.5% marcaine plain, 0.5 cc 2% lidocaine plain and, 1 cc kenalog 10. ?Disposition: Patient tolerated procedure well. Injection site dressed with a band-aid.  ?Post-injection care was discussed and return precautions discussed.  ? ?Return in about 6 weeks (around 09/27/2021). ? ?Trula Slade DPM ? ?

## 2021-09-20 DIAGNOSIS — Z85828 Personal history of other malignant neoplasm of skin: Secondary | ICD-10-CM | POA: Diagnosis not present

## 2021-09-20 DIAGNOSIS — D2272 Melanocytic nevi of left lower limb, including hip: Secondary | ICD-10-CM | POA: Diagnosis not present

## 2021-09-20 DIAGNOSIS — D224 Melanocytic nevi of scalp and neck: Secondary | ICD-10-CM | POA: Diagnosis not present

## 2021-09-20 DIAGNOSIS — L821 Other seborrheic keratosis: Secondary | ICD-10-CM | POA: Diagnosis not present

## 2021-09-20 DIAGNOSIS — L570 Actinic keratosis: Secondary | ICD-10-CM | POA: Diagnosis not present

## 2021-09-20 DIAGNOSIS — L578 Other skin changes due to chronic exposure to nonionizing radiation: Secondary | ICD-10-CM | POA: Diagnosis not present

## 2021-09-29 ENCOUNTER — Ambulatory Visit: Payer: Medicare Other | Admitting: Podiatry

## 2021-10-19 ENCOUNTER — Other Ambulatory Visit: Payer: Self-pay | Admitting: Internal Medicine

## 2021-10-19 DIAGNOSIS — E785 Hyperlipidemia, unspecified: Secondary | ICD-10-CM

## 2021-10-19 DIAGNOSIS — R7989 Other specified abnormal findings of blood chemistry: Secondary | ICD-10-CM

## 2021-10-19 DIAGNOSIS — R4189 Other symptoms and signs involving cognitive functions and awareness: Secondary | ICD-10-CM

## 2021-10-19 DIAGNOSIS — F339 Major depressive disorder, recurrent, unspecified: Secondary | ICD-10-CM

## 2021-10-20 DIAGNOSIS — E785 Hyperlipidemia, unspecified: Secondary | ICD-10-CM | POA: Diagnosis not present

## 2021-10-20 DIAGNOSIS — F339 Major depressive disorder, recurrent, unspecified: Secondary | ICD-10-CM | POA: Diagnosis not present

## 2021-10-20 DIAGNOSIS — R7989 Other specified abnormal findings of blood chemistry: Secondary | ICD-10-CM | POA: Diagnosis not present

## 2021-10-20 DIAGNOSIS — R4189 Other symptoms and signs involving cognitive functions and awareness: Secondary | ICD-10-CM | POA: Diagnosis not present

## 2021-10-21 LAB — COMPLETE METABOLIC PANEL WITH GFR
AG Ratio: 1.6 (calc) (ref 1.0–2.5)
ALT: 16 U/L (ref 6–29)
AST: 16 U/L (ref 10–35)
Albumin: 4.1 g/dL (ref 3.6–5.1)
Alkaline phosphatase (APISO): 87 U/L (ref 37–153)
BUN: 14 mg/dL (ref 7–25)
CO2: 26 mmol/L (ref 20–32)
Calcium: 9.8 mg/dL (ref 8.6–10.4)
Chloride: 104 mmol/L (ref 98–110)
Creat: 0.84 mg/dL (ref 0.60–0.95)
Globulin: 2.6 g/dL (calc) (ref 1.9–3.7)
Glucose, Bld: 82 mg/dL (ref 65–99)
Potassium: 4.6 mmol/L (ref 3.5–5.3)
Sodium: 136 mmol/L (ref 135–146)
Total Bilirubin: 0.4 mg/dL (ref 0.2–1.2)
Total Protein: 6.7 g/dL (ref 6.1–8.1)
eGFR: 67 mL/min/{1.73_m2} (ref >=60)

## 2021-10-21 LAB — CBC WITH DIFFERENTIAL/PLATELET
Absolute Monocytes: 782 cells/uL (ref 200–950)
Basophils Absolute: 68 cells/uL (ref 0–200)
Basophils Relative: 0.8 %
Eosinophils Absolute: 315 cells/uL (ref 15–500)
Eosinophils Relative: 3.7 %
HCT: 40.3 % (ref 35.0–45.0)
Hemoglobin: 13.3 g/dL (ref 11.7–15.5)
Lymphs Abs: 2669 cells/uL (ref 850–3900)
MCH: 29.8 pg (ref 27.0–33.0)
MCHC: 33 g/dL (ref 32.0–36.0)
MCV: 90.4 fL (ref 80.0–100.0)
MPV: 11.3 fL (ref 7.5–12.5)
Monocytes Relative: 9.2 %
Neutro Abs: 4667 cells/uL (ref 1500–7800)
Neutrophils Relative %: 54.9 %
Platelets: 316 10*3/uL (ref 140–400)
RBC: 4.46 10*6/uL (ref 3.80–5.10)
RDW: 13.8 % (ref 11.0–15.0)
Total Lymphocyte: 31.4 %
WBC: 8.5 10*3/uL (ref 3.8–10.8)

## 2021-10-21 LAB — T3: T3, Total: 84 ng/dL (ref 76–181)

## 2021-10-21 LAB — LIPID PANEL
Cholesterol: 272 mg/dL — ABNORMAL HIGH (ref <200)
HDL: 63 mg/dL (ref >=50)
LDL Cholesterol (Calc): 180 mg/dL (calc) — ABNORMAL HIGH
Non-HDL Cholesterol (Calc): 209 mg/dL (calc) — ABNORMAL HIGH (ref <130)
Total CHOL/HDL Ratio: 4.3 (calc) (ref <5.0)
Triglycerides: 150 mg/dL — ABNORMAL HIGH (ref <150)

## 2021-10-21 LAB — TSH: TSH: 5.98 mIU/L — ABNORMAL HIGH (ref 0.40–4.50)

## 2021-10-21 LAB — T4, FREE: Free T4: 0.9 ng/dL (ref 0.8–1.8)

## 2021-10-26 ENCOUNTER — Encounter: Payer: Self-pay | Admitting: Internal Medicine

## 2021-10-26 ENCOUNTER — Non-Acute Institutional Stay: Payer: Medicare Other | Admitting: Internal Medicine

## 2021-10-26 VITALS — BP 139/66 | HR 77 | Temp 97.6°F | Ht 62.0 in | Wt 162.8 lb

## 2021-10-26 DIAGNOSIS — M720 Palmar fascial fibromatosis [Dupuytren]: Secondary | ICD-10-CM | POA: Diagnosis not present

## 2021-10-26 DIAGNOSIS — F1011 Alcohol abuse, in remission: Secondary | ICD-10-CM | POA: Diagnosis not present

## 2021-10-26 DIAGNOSIS — E785 Hyperlipidemia, unspecified: Secondary | ICD-10-CM

## 2021-10-26 DIAGNOSIS — R7989 Other specified abnormal findings of blood chemistry: Secondary | ICD-10-CM | POA: Diagnosis not present

## 2021-10-26 DIAGNOSIS — H6521 Chronic serous otitis media, right ear: Secondary | ICD-10-CM

## 2021-10-26 DIAGNOSIS — R4189 Other symptoms and signs involving cognitive functions and awareness: Secondary | ICD-10-CM

## 2021-10-26 DIAGNOSIS — F339 Major depressive disorder, recurrent, unspecified: Secondary | ICD-10-CM | POA: Diagnosis not present

## 2021-10-26 MED ORDER — GABAPENTIN 100 MG PO CAPS
100.0000 mg | ORAL_CAPSULE | Freq: Every day | ORAL | 3 refills | Status: DC
Start: 2021-10-26 — End: 2022-11-07

## 2021-10-26 MED ORDER — ROSUVASTATIN CALCIUM 5 MG PO TABS: 5.0000 mg | ORAL_TABLET | Freq: Every day | ORAL | 3 refills | Status: DC

## 2021-10-26 NOTE — Progress Notes (Signed)
Location:  [facility] (12)  Provider:   Code Status:  Goals of Care:     10/26/2021    2:32 PM  Advanced Directives  Does Patient Have a Medical Advance Directive? Yes  Type of Paramedic of [place];Out of facility DNR (pink MOST or yellow form)  Does patient want to make changes to medical advance directive? No - Patient declined  Copy of [place] in Chart? Yes - validated most recent copy scanned in chart (See row information)  Pre-existing out of facility DNR order (yellow form or pink MOST form) Yellow form placed in chart (order not valid for inpatient use)     Chief Complaint  Patient presents with   Medical Management of Chronic Issues    Patient returns to the clinic for her 6 month follow up.     HPI: Patient is a 88 y.o. female seen today for medical management of chronic diseases.    Lives in [place]   Patient has history of hyper lipidemia, arthritis status post right hip arthroplasty,  essential tremors, anxiety and constipation H/o Alcohol Abuse. Quit since 12/20 Also Bilateral Shoulder Pain Right ear chronic pain Plantar Fascitis  Continues to do well Walks with her friends all the time Per daughter able to keep up with her apartment Sleeping well also     Past Medical History:  Diagnosis Date   Allergic rhinitis due to pollen    Anxiety state, unspecified    Arthritis    RIGHT LEG, HANDS.   Cancer (HCC)    FACE/NOSE -SKIN-squamous cell and basal cell.   Disorder of bone and cartilage, unspecified    Lumbago    Lyme borreliosis    Other atopic dermatitis and related conditions    Other dysphagia    Reflux esophagitis    Senile osteoporosis    TMJ dysfunction    MORE ON LEFT "WEARS BITE PROTECTION AT NIGHT"    Past Surgical History:  Procedure Laterality Date   APPENDECTOMY  1950   CATARACT EXTRACTION, BILATERAL Bilateral    EYE SURGERY Right    right eye  ptrigium excision x2   HERNIA REPAIR Bilateral    TOTAL HIP ARTHROPLASTY Right 12/10/2015   Procedure: RIGHT TOTAL HIP ARTHROPLASTY ANTERIOR APPROACH;  Surgeon: [name], MD;  Location: [facility];  Service: Orthopedics;  Laterality: Right;  Spinal to General    Allergies  Allergen Reactions   Brandy [Alcohol] Anaphylaxis    Related to grapes   Prune Anaphylaxis    PLUMS,GRAPES,RAISINS   Aspirin Other (See Comments)    Stomach bleed   Augmentin [Amoxicillin-Pot Clavulanate] Hives and Itching   Clindamycin Other (See Comments)    unkown   Omnicef [Cefdinir] Hives   Vinegar [Acetic Acid]     basalmic   Alprazolam Rash   Ceftin [Cefuroxime Axetil] Rash   Doxycycline Rash   Eryc [Erythromycin] Rash   Penicillins Rash    Outpatient Encounter Medications as of 10/26/2021  Medication Sig   acetaminophen (TYLENOL) 325 MG tablet Take 650 mg by mouth every 6 (six) hours as needed for mild pain.   cholecalciferol (VITAMIN D) 1000 UNITS tablet Take 1,000 Units by mouth daily.   EPINEPHrine (EPIPEN 2-PAK) 0.3 mg/0.3 mL IJ SOAJ injection Inject 0.3 mg into the muscle as needed for anaphylaxis.   fluticasone (FLONASE) 50 MCG/ACT nasal spray Place 2 sprays into both nostrils daily.   gabapentin (NEURONTIN)  100 MG capsule Take 1 capsule (100 mg total) by mouth at bedtime.   GARLIC PO Take 1 tablet by mouth daily.   senna (SENOKOT) 8.6 MG TABS tablet Take 1 tablet by mouth daily as needed for mild constipation.   [DISCONTINUED] latanoprost (XALATAN) 0.005 % ophthalmic solution Place 1 drop into both eyes at bedtime.   [DISCONTINUED] triamcinolone cream (KENALOG) 0.1 % Apply 1 application topically 2 (two) times daily.   No facility-administered encounter medications on file as of 10/26/2021.    Review of Systems:  Review of Systems  Constitutional:  Negative for activity change and appetite change.  HENT: Negative.    Respiratory:  Negative for cough and shortness of breath.    Cardiovascular:  Negative for leg swelling.  Gastrointestinal:  Negative for constipation.  Genitourinary: Negative.   Musculoskeletal:  Negative for arthralgias, gait problem and myalgias.  Skin: Negative.   Neurological:  Negative for dizziness and weakness.  Psychiatric/Behavioral:  Negative for confusion, dysphoric mood and sleep disturbance.     Health Maintenance  Topic Date Due   INFLUENZA VACCINE  11/29/2021   TETANUS/TDAP  05/03/2025   Pneumonia Vaccine 91+ Years old  Completed   DEXA SCAN  Completed   COVID-19 Vaccine  Completed   Zoster Vaccines- Shingrix  Completed   HPV VACCINES  Aged Out    Physical Exam: Vitals:   10/26/21 1441  BP: 139/66  Pulse: 77  Temp: 97.6 F (36.4 C)  SpO2: 97%  Weight: 162 lb 12.8 oz (73.8 kg)  Height: '5\' 2"'$  (1.575 m)   Body mass index is 29.78 kg/m. Physical Exam Vitals reviewed.  Constitutional:      Appearance: Normal appearance.  HENT:     Head: Normocephalic.     Right Ear: Tympanic membrane normal.     Left Ear: Tympanic membrane normal.     Nose: Nose normal.     Mouth/Throat:     Mouth: Mucous membranes are moist.     Pharynx: Oropharynx is clear.  Eyes:     Pupils: Pupils are equal, round, and reactive to light.  Cardiovascular:     Rate and Rhythm: Normal rate and regular rhythm.     Pulses: Normal pulses.     Heart sounds: Normal heart sounds. No murmur heard. Pulmonary:     Effort: Pulmonary effort is normal.     Breath sounds: Normal breath sounds.  Abdominal:     General: Abdomen is flat. Bowel sounds are normal.     Palpations: Abdomen is soft.  Musculoskeletal:        General: No swelling.     Cervical back: Neck supple.  Skin:    General: Skin is warm.  Neurological:     General: No focal deficit present.     Mental Status: She is alert.  Psychiatric:        Mood and Affect: Mood normal.        Thought Content: Thought content normal.     Labs reviewed: Basic Metabolic Panel: Recent  Labs    01/05/21 0834 04/21/21 0800 10/20/21 0820  NA 139 139 136  K 4.4 4.8 4.6  CL 105 107 104  CO2 '25 25 26  '$ GLUCOSE 85 90 82  BUN '12 20 14  '$ CREATININE 0.74 0.83 0.84  CALCIUM 10.0 9.9 9.8  TSH 4.83* 7.00* 5.98*   Liver Function Tests: Recent Labs    01/05/21 0834 04/21/21 0800 10/20/21 0820  AST '14 16 16  '$ ALT 11 12  16  BILITOT 0.4 0.5 0.4  PROT 6.6 6.6 6.7   No results for input(s): "LIPASE", "AMYLASE" in the last 8760 hours. No results for input(s): "AMMONIA" in the last 8760 hours. CBC: Recent Labs    01/05/21 0834 04/21/21 0800 10/20/21 0820  WBC 9.0 8.2 8.5  NEUTROABS 5,319 4,559 4,667  HGB 13.1 13.5 13.3  HCT 39.1 40.5 40.3  MCV 92.4 91.8 90.4  PLT 298 308 316   Lipid Panel: Recent Labs    01/05/21 0834 04/21/21 0800 10/20/21 0820  CHOL 249* 273* 272*  HDL 61 60 63  LDLCALC 161* 181* 180*  TRIG 148 171* 150*  CHOLHDL 4.1 4.6 4.3   No results found for: "HGBA1C"  Procedures since last visit: No results found.  Assessment/Plan 1. Elevated TSH Continue to follow every 6 months  2. Depression, recurrent ([place]) Mood stable Has refused meds before Seems to be doing good  3. Cognitive impairment Repeat MMSE next visit Last MMSE 22/30 Staying very high functioning  4. Hyperlipidemia with target LDL less than 100 LDL has been rising And patient does not want to consider Diet modification Did agree to try low dose statin  5. Right chronic serous otitis media Nasal spray PRN is helping  6. Planter Fascitis Symptoms better but not gone Is thinking of getting new Insert  7. H/O alcohol abuse none for last few years Takes gabapentin at night     Labs/tests ordered:  * No order type specified * Next appt:  12/27/2021

## 2021-12-27 ENCOUNTER — Encounter: Payer: Self-pay | Admitting: Nurse Practitioner

## 2021-12-27 ENCOUNTER — Ambulatory Visit (INDEPENDENT_AMBULATORY_CARE_PROVIDER_SITE_OTHER): Payer: Medicare Other | Admitting: Nurse Practitioner

## 2021-12-27 ENCOUNTER — Telehealth: Payer: Self-pay

## 2021-12-27 DIAGNOSIS — Z0000 Encounter for general adult medical examination without abnormal findings: Secondary | ICD-10-CM | POA: Diagnosis not present

## 2021-12-27 NOTE — Telephone Encounter (Signed)
[name], kube are scheduled for a virtual visit with your provider today.    Just as we do with appointments in the office, we must obtain your consent to participate.  Your consent will be active for this visit and any virtual visit you may have with one of our providers in the next 365 days.    If you have a MyChart account, I can also send a copy of this consent to you electronically.  All virtual visits are billed to your insurance company just like a traditional visit in the office.  As this is a virtual visit, video technology does not allow for your provider to perform a traditional examination.  This may limit your provider's ability to fully assess your condition.  If your provider identifies any concerns that need to be evaluated in person or the need to arrange testing such as labs, EKG, etc, we will make arrangements to do so.    Although advances in technology are sophisticated, we cannot ensure that it will always work on either your end or our end.  If the connection with a video visit is poor, we may have to switch to a telephone visit.  With either a video or telephone visit, we are not always able to ensure that we have a secure connection.   I need to obtain your verbal consent now.   Are you willing to proceed with your visit today?   Rachel Morris has provided verbal consent on 12/27/2021 for a virtual visit (video or telephone).   [name], [place] 12/27/2021  1:20 PM

## 2021-12-27 NOTE — Progress Notes (Signed)
   This service is provided via telemedicine  No vital signs collected/recorded due to the encounter was a telemedicine visit.   Location of patient (ex: home, work):  Home  Patient consents to a telephone visit: Yes, see telephone visit dated 12/27/21  Location of the provider (ex: office, home):  [place], [place]   Name of any referring provider:  N/A  Names of all persons participating in the telemedicine service and their role in the encounter:  S.Chrae B/CMA, Sherrie Mustache, NP, and Patient   Time spent on call:  9 min with medical assistant
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
## 2021-12-27 NOTE — Patient Instructions (Signed)
Rachel Morris , Thank you for taking time to come for your Medicare Wellness Visit. I appreciate your ongoing commitment to your health goals. Please review the following plan we discussed and let me know if I can assist you in the future.   Screening recommendations/referrals: Colonoscopy age out Mammogram age out Bone Density -dicussed osteoporosis screening  Recommended yearly ophthalmology/optometry visit for glaucoma screening and checkup Recommended yearly dental visit for hygiene and checkup  Vaccinations: Influenza vaccine- due annually in September/October Pneumococcal vaccine up to date Tdap vaccine up to date Shingles vaccine up to date     Advanced directives: on file.   Conditions/risks identified: advanced age, progressive memory loss  Next appointment: yearly for awv- next will be in person   Preventive Care 70 Years and Older, Female Preventive care refers to lifestyle choices and visits with your health care provider that can promote health and wellness. What does preventive care include? A yearly physical exam. This is also called an annual well check. Dental exams once or twice a year. Routine eye exams. Ask your health care provider how often you should have your eyes checked. Personal lifestyle choices, including: Daily care of your teeth and gums. Regular physical activity. Eating a healthy diet. Avoiding tobacco and drug use. Limiting alcohol use. Practicing safe sex. Taking low-dose aspirin every day. Taking vitamin and mineral supplements as recommended by your health care provider. What happens during an annual well check? The services and screenings done by your health care provider during your annual well check will depend on your age, overall health, lifestyle risk factors, and family history of disease. Counseling  Your health care provider may ask you questions about your: Alcohol use. Tobacco use. Drug use. Emotional well-being. Home and  relationship well-being. Sexual activity. Eating habits. History of falls. Memory and ability to understand (cognition). Work and work Statistician. Reproductive health. Screening  You may have the following tests or measurements: Height, weight, and BMI. Blood pressure. Lipid and cholesterol levels. These may be checked every 5 years, or more frequently if you are over 47 years old. Skin check. Lung cancer screening. You may have this screening every year starting at age 88 if you have a 30-pack-year history of smoking and currently smoke or have quit within the past 15 years. Fecal occult blood test (FOBT) of the stool. You may have this test every year starting at age 21. Flexible sigmoidoscopy or colonoscopy. You may have a sigmoidoscopy every 5 years or a colonoscopy every 10 years starting at age 23. Hepatitis C blood test. Hepatitis B blood test. Sexually transmitted disease (STD) testing. Diabetes screening. This is done by checking your blood sugar (glucose) after you have not eaten for a while (fasting). You may have this done every 1-3 years. Bone density scan. This is done to screen for osteoporosis. You may have this done starting at age 93. Mammogram. This may be done every 1-2 years. Talk to your health care provider about how often you should have regular mammograms. Talk with your health care provider about your test results, treatment options, and if necessary, the need for more tests. Vaccines  Your health care provider may recommend certain vaccines, such as: Influenza vaccine. This is recommended every year. Tetanus, diphtheria, and acellular pertussis (Tdap, Td) vaccine. You may need a Td booster every 10 years. Zoster vaccine. You may need this after age 18. Pneumococcal 13-valent conjugate (PCV13) vaccine. One dose is recommended after age 86. Pneumococcal polysaccharide (PPSV23) vaccine. One dose  is recommended after age 27. Talk to your health care provider  about which screenings and vaccines you need and how often you need them. This information is not intended to replace advice given to you by your health care provider. Make sure you discuss any questions you have with your health care provider. Document Released: 05/14/2015 Document Revised: 01/05/2016 Document Reviewed: 02/16/2015 Elsevier Interactive Patient Education  2017 [place] Prevention in the Home Falls can cause injuries. They can happen to people of all ages. There are many things you can do to make your home safe and to help prevent falls. What can I do on the outside of my home? Regularly fix the edges of walkways and driveways and fix any cracks. Remove anything that might make you trip as you walk through a door, such as a raised step or threshold. Trim any bushes or trees on the path to your home. Use bright outdoor lighting. Clear any walking paths of anything that might make someone trip, such as rocks or tools. Regularly check to see if handrails are loose or broken. Make sure that both sides of any steps have handrails. Any raised decks and porches should have guardrails on the edges. Have any leaves, snow, or ice cleared regularly. Use sand or salt on walking paths during winter. Clean up any spills in your garage right away. This includes oil or grease spills. What can I do in the bathroom? Use night lights. Install grab bars by the toilet and in the tub and shower. Do not use towel bars as grab bars. Use non-skid mats or decals in the tub or shower. If you need to sit down in the shower, use a plastic, non-slip stool. Keep the floor dry. Clean up any water that spills on the floor as soon as it happens. Remove soap buildup in the tub or shower regularly. Attach bath mats securely with double-sided non-slip rug tape. Do not have throw rugs and other things on the floor that can make you trip. What can I do in the bedroom? Use night lights. Make sure  that you have a light by your bed that is easy to reach. Do not use any sheets or blankets that are too big for your bed. They should not hang down onto the floor. Have a firm chair that has side arms. You can use this for support while you get dressed. Do not have throw rugs and other things on the floor that can make you trip. What can I do in the kitchen? Clean up any spills right away. Avoid walking on wet floors. Keep items that you use a lot in easy-to-reach places. If you need to reach something above you, use a strong step stool that has a grab bar. Keep electrical cords out of the way. Do not use floor polish or wax that makes floors slippery. If you must use wax, use non-skid floor wax. Do not have throw rugs and other things on the floor that can make you trip. What can I do with my stairs? Do not leave any items on the stairs. Make sure that there are handrails on both sides of the stairs and use them. Fix handrails that are broken or loose. Make sure that handrails are as long as the stairways. Check any carpeting to make sure that it is firmly attached to the stairs. Fix any carpet that is loose or worn. Avoid having throw rugs at the top or bottom of the stairs.  If you do have throw rugs, attach them to the floor with carpet tape. Make sure that you have a light switch at the top of the stairs and the bottom of the stairs. If you do not have them, ask someone to add them for you. What else can I do to help prevent falls? Wear shoes that: Do not have high heels. Have rubber bottoms. Are comfortable and fit you well. Are closed at the toe. Do not wear sandals. If you use a stepladder: Make sure that it is fully opened. Do not climb a closed stepladder. Make sure that both sides of the stepladder are locked into place. Ask someone to hold it for you, if possible. Clearly mark and make sure that you can see: Any grab bars or handrails. First and last steps. Where the edge of  each step is. Use tools that help you move around (mobility aids) if they are needed. These include: Canes. Walkers. Scooters. Crutches. Turn on the lights when you go into a dark area. Replace any light bulbs as soon as they burn out. Set up your furniture so you have a clear path. Avoid moving your furniture around. If any of your floors are uneven, fix them. If there are any pets around you, be aware of where they are. Review your medicines with your doctor. Some medicines can make you feel dizzy. This can increase your chance of falling. Ask your doctor what other things that you can do to help prevent falls. This information is not intended to replace advice given to you by your health care provider. Make sure you discuss any questions you have with your health care provider. Document Released: 02/11/2009 Document Revised: 09/23/2015 Document Reviewed: 05/22/2014 Elsevier Interactive Patient Education  2017 [employer].

## 2021-12-30 NOTE — Telephone Encounter (Signed)
This encounter was created in error - please disregard.

## 2022-03-07 DIAGNOSIS — Z23 Encounter for immunization: Secondary | ICD-10-CM | POA: Diagnosis not present

## 2022-04-13 ENCOUNTER — Ambulatory Visit (INDEPENDENT_AMBULATORY_CARE_PROVIDER_SITE_OTHER): Payer: Medicare Other | Admitting: Podiatry

## 2022-04-13 ENCOUNTER — Encounter: Payer: Self-pay | Admitting: Podiatry

## 2022-04-13 VITALS — BP 122/58 | HR 73 | Temp 97.6°F

## 2022-04-13 DIAGNOSIS — M722 Plantar fascial fibromatosis: Secondary | ICD-10-CM

## 2022-04-13 MED ORDER — DICLOFENAC SODIUM 1 % EX GEL: 2.0000 g | Freq: Four times a day (QID) | CUTANEOUS | 2 refills | Status: DC

## 2022-04-13 NOTE — Patient Instructions (Signed)

## 2022-04-13 NOTE — Progress Notes (Signed)
Subjective: Chief Complaint  Patient presents with   Plantar Fasciitis    Rt foot pain/PF  Patient has right foot pain x 3 month and is causing her to limp when walking. Tx-wearing foot brace and orthotics in shoes and tylenol   Pain level-20     88 year old female presents the office to the above concerns with her daughter.  She states that it is now the right foot. She got new shoes/inserts 4-6 weeks then her foot started huring. Pain is constant. No injury. She tried hemp cream. It has not helped much.    Objective: AAO x3, NAD DP/PT pulses palpable bilaterally, CRT less than 3 seconds Tenderness to palpation along the plantar medial tubercle of the calcaneus at the insertion of plantar fascia on the right foot. There is no pain along the course of the plantar fascia within the arch of the foot. Plantar fascia appears to be intact. There is no pain with lateral compression of the calcaneus or pain with vibratory sensation. There is no pain along the course or insertion of the achilles tendon. No other areas of tenderness to bilateral lower extremities.  No open lesions or pre-ulcerative lesions.  No pain with calf compression, swelling, warmth, erythema  Assessment: Right heel pain, Plantar fasciitis  Plan: -All treatment options discussed with the patient including all alternatives, risks, complications.  -X-rays were obtained reviewed of the right foot.  3 views of the foot were obtained.  No evidence of acute fracture. -Discussed icing, Taryn gel.  Offer steroid injection if needed.  Discussed traction, rehab exercises.  Will order physical therapy to be done at friend's home where she lives.  Orders placed and we will fax this for her.  Continue shoes and good arch support.  Discussed getting new inserts as well.  She has not come back to get measured for these as she was not able to do it today. -Patient encouraged to call the office with any questions, concerns, change in  symptoms.   Trula Slade DPM

## 2022-04-19 ENCOUNTER — Other Ambulatory Visit: Payer: Self-pay | Admitting: Internal Medicine

## 2022-04-19 ENCOUNTER — Telehealth: Payer: Self-pay

## 2022-04-19 DIAGNOSIS — R7989 Other specified abnormal findings of blood chemistry: Secondary | ICD-10-CM

## 2022-04-19 DIAGNOSIS — E785 Hyperlipidemia, unspecified: Secondary | ICD-10-CM

## 2022-04-19 DIAGNOSIS — R42 Dizziness and giddiness: Secondary | ICD-10-CM

## 2022-04-19 DIAGNOSIS — F339 Major depressive disorder, recurrent, unspecified: Secondary | ICD-10-CM

## 2022-04-19 NOTE — Telephone Encounter (Signed)
Yes she can.

## 2022-04-19 NOTE — Telephone Encounter (Signed)
Patients daughter called wanting to know if it is ok for patient to get RSV vaccine.

## 2022-04-20 ENCOUNTER — Other Ambulatory Visit: Payer: Medicare Other

## 2022-04-20 DIAGNOSIS — R42 Dizziness and giddiness: Secondary | ICD-10-CM

## 2022-04-20 DIAGNOSIS — R7989 Other specified abnormal findings of blood chemistry: Secondary | ICD-10-CM

## 2022-04-20 DIAGNOSIS — E785 Hyperlipidemia, unspecified: Secondary | ICD-10-CM | POA: Diagnosis not present

## 2022-04-20 DIAGNOSIS — F339 Major depressive disorder, recurrent, unspecified: Secondary | ICD-10-CM

## 2022-04-20 LAB — CBC WITH DIFFERENTIAL/PLATELET
Absolute Monocytes: 835 cells/uL (ref 200–950)
Basophils Absolute: 78 cells/uL (ref 0–200)
Basophils Relative: 0.9 %
Eosinophils Absolute: 583 cells/uL — ABNORMAL HIGH (ref 15–500)
Eosinophils Relative: 6.7 %
HCT: 37.7 % (ref 35.0–45.0)
Hemoglobin: 12.5 g/dL (ref 11.7–15.5)
Lymphs Abs: 2636 cells/uL (ref 850–3900)
MCH: 30.4 pg (ref 27.0–33.0)
MCHC: 33.2 g/dL (ref 32.0–36.0)
MCV: 91.7 fL (ref 80.0–100.0)
MPV: 11.3 fL (ref 7.5–12.5)
Monocytes Relative: 9.6 %
Neutro Abs: 4568 cells/uL (ref 1500–7800)
Neutrophils Relative %: 52.5 %
Platelets: 288 10*3/uL (ref 140–400)
RBC: 4.11 10*6/uL (ref 3.80–5.10)
RDW: 13.1 % (ref 11.0–15.0)
Total Lymphocyte: 30.3 %
WBC: 8.7 10*3/uL (ref 3.8–10.8)

## 2022-04-20 LAB — LIPID PANEL
Cholesterol: 189 mg/dL (ref <200)
HDL: 57 mg/dL (ref >=50)
LDL Cholesterol (Calc): 110 mg/dL (calc) — ABNORMAL HIGH
Non-HDL Cholesterol (Calc): 132 mg/dL (calc) — ABNORMAL HIGH (ref <130)
Total CHOL/HDL Ratio: 3.3 (calc) (ref <5.0)
Triglycerides: 113 mg/dL (ref <150)

## 2022-04-20 LAB — COMPLETE METABOLIC PANEL WITHOUT GFR
AG Ratio: 1.6 (calc) (ref 1.0–2.5)
ALT: 12 U/L (ref 6–29)
AST: 15 U/L (ref 10–35)
Albumin: 4.1 g/dL (ref 3.6–5.1)
Alkaline phosphatase (APISO): 86 U/L (ref 37–153)
BUN: 23 mg/dL (ref 7–25)
CO2: 24 mmol/L (ref 20–32)
Calcium: 10 mg/dL (ref 8.6–10.4)
Chloride: 107 mmol/L (ref 98–110)
Creat: 0.86 mg/dL (ref 0.60–0.95)
Globulin: 2.5 g/dL (ref 1.9–3.7)
Glucose, Bld: 83 mg/dL (ref 65–99)
Potassium: 4.5 mmol/L (ref 3.5–5.3)
Sodium: 140 mmol/L (ref 135–146)
Total Bilirubin: 0.3 mg/dL (ref 0.2–1.2)
Total Protein: 6.6 g/dL (ref 6.1–8.1)
eGFR: 65 mL/min/1.73m2

## 2022-04-20 NOTE — Telephone Encounter (Signed)
Spoke with [name] and she verbalized her understanding.

## 2022-04-21 LAB — TSH: TSH: 6.4 mIU/L — ABNORMAL HIGH (ref 0.40–4.50)

## 2022-04-26 ENCOUNTER — Encounter: Payer: Medicare Other | Admitting: Internal Medicine

## 2022-05-02 DIAGNOSIS — R262 Difficulty in walking, not elsewhere classified: Secondary | ICD-10-CM | POA: Diagnosis not present

## 2022-05-02 DIAGNOSIS — M722 Plantar fascial fibromatosis: Secondary | ICD-10-CM | POA: Diagnosis not present

## 2022-05-02 DIAGNOSIS — R2681 Unsteadiness on feet: Secondary | ICD-10-CM | POA: Diagnosis not present

## 2022-05-03 DIAGNOSIS — R262 Difficulty in walking, not elsewhere classified: Secondary | ICD-10-CM | POA: Diagnosis not present

## 2022-05-03 DIAGNOSIS — R2681 Unsteadiness on feet: Secondary | ICD-10-CM | POA: Diagnosis not present

## 2022-05-03 DIAGNOSIS — M722 Plantar fascial fibromatosis: Secondary | ICD-10-CM | POA: Diagnosis not present

## 2022-05-09 DIAGNOSIS — R2681 Unsteadiness on feet: Secondary | ICD-10-CM | POA: Diagnosis not present

## 2022-05-09 DIAGNOSIS — R262 Difficulty in walking, not elsewhere classified: Secondary | ICD-10-CM | POA: Diagnosis not present

## 2022-05-09 DIAGNOSIS — M722 Plantar fascial fibromatosis: Secondary | ICD-10-CM | POA: Diagnosis not present

## 2022-05-10 ENCOUNTER — Non-Acute Institutional Stay: Payer: Medicare Other | Admitting: Internal Medicine

## 2022-05-10 ENCOUNTER — Encounter: Payer: Self-pay | Admitting: Internal Medicine

## 2022-05-10 VITALS — BP 124/62 | HR 83 | Temp 97.9°F | Resp 17 | Ht 62.0 in | Wt 159.6 lb

## 2022-05-10 DIAGNOSIS — R7989 Other specified abnormal findings of blood chemistry: Secondary | ICD-10-CM | POA: Diagnosis not present

## 2022-05-10 DIAGNOSIS — R052 Subacute cough: Secondary | ICD-10-CM | POA: Diagnosis not present

## 2022-05-10 DIAGNOSIS — M79671 Pain in right foot: Secondary | ICD-10-CM

## 2022-05-10 DIAGNOSIS — M549 Dorsalgia, unspecified: Secondary | ICD-10-CM | POA: Diagnosis not present

## 2022-05-10 DIAGNOSIS — E785 Hyperlipidemia, unspecified: Secondary | ICD-10-CM

## 2022-05-10 NOTE — Progress Notes (Signed)
Location:  [place] of Service:  Clinic ([id number])  Provider:   Code Status: DNR Goals of Care:     05/10/2022    1:00 PM  Advanced Directives  Does Patient Have a Medical Advance Directive? Yes  Type of Advance Directive Living will;Healthcare Power of [place];Out of facility DNR (pink MOST or yellow form)  Does patient want to make changes to medical advance directive? No - Patient declined  Copy of [place] in Chart? Yes - validated most recent copy scanned in chart (See row information)     Chief Complaint  Patient presents with   Medical Management of Chronic Issues    6 month follow up. Patient states she has been having some back pain and right foot    HPI: Patient is a 89 y.o. female seen today for medical management of chronic diseases.    Lives in [place]  H/o HLD Was started on Statin as her LDL was 180 But her daughter thinks patient is feeling slow and Tired easily and wants to try stopping Statin to see if it is the cause  Right Heel Plantar Fascitis Seen Dr Earleen Newport Having pain Working with therapy Limping when walking due to pain Also now using Walker Left mid back  pain  Noticed this morning  Also has Cough with Thick Sputum going on for past few weeks No Fever or SOB   H/o arthritis status post right hip arthroplasty,  essential tremors, anxiety and constipation H/o Alcohol Abuse. Quit since 12/20 Also Bilateral Shoulder Pain Right ear chronic pain Allergic to Coffee Prunes and raisins  Past Medical History:  Diagnosis Date   Allergic rhinitis due to pollen    Anxiety state, unspecified    Arthritis    RIGHT LEG, HANDS.   Cancer (HCC)    FACE/NOSE -SKIN-squamous cell and basal cell.   Disorder of bone and cartilage, unspecified    Lumbago    Lyme borreliosis    Other atopic dermatitis and related conditions    Other dysphagia    Reflux esophagitis    Senile osteoporosis    TMJ dysfunction    MORE ON LEFT  "WEARS BITE PROTECTION AT NIGHT"    Past Surgical History:  Procedure Laterality Date   APPENDECTOMY  1950   CATARACT EXTRACTION, BILATERAL Bilateral    EYE SURGERY Right    right eye ptrigium excision x2   HERNIA REPAIR Bilateral    TOTAL HIP ARTHROPLASTY Right 12/10/2015   Procedure: RIGHT TOTAL HIP ARTHROPLASTY ANTERIOR APPROACH;  Surgeon: [name], MD;  Location: [facility];  Service: Orthopedics;  Laterality: Right;  Spinal to General    Allergies  Allergen Reactions   Brandy [Alcohol] Anaphylaxis    Related to grapes   Prune Anaphylaxis    PLUMS,GRAPES,RAISINS   Aspirin Other (See Comments)    Stomach bleed   Augmentin [Amoxicillin-Pot Clavulanate] Hives and Itching   Clindamycin Other (See Comments)    unkown   Omnicef [Cefdinir] Hives   Vinegar [Acetic Acid]     basalmic   Alprazolam Rash   Ceftin [Cefuroxime Axetil] Rash   Doxycycline Rash   Eryc [Erythromycin] Rash   Penicillins Rash    Outpatient Encounter Medications as of 05/10/2022  Medication Sig   acetaminophen (TYLENOL) 325 MG tablet Take 650 mg by mouth every 6 (six) hours as needed for mild pain.   cholecalciferol (VITAMIN D) 1000 UNITS tablet Take 1,000 Units by mouth daily.  diclofenac Sodium (VOLTAREN) 1 % GEL Apply 2 g topically 4 (four) times daily. Rub into affected area of foot 2 to 4 times daily   EPINEPHrine (EPIPEN 2-PAK) 0.3 mg/0.3 mL IJ SOAJ injection Inject 0.3 mg into the muscle as needed for anaphylaxis.   fluticasone (FLONASE) 50 MCG/ACT nasal spray Place 2 sprays into both nostrils daily.   gabapentin (NEURONTIN) 100 MG capsule Take 1 capsule (100 mg total) by mouth at bedtime.   GARLIC PO Take 1 tablet by mouth daily.   latanoprost (XALATAN) 0.005 % ophthalmic solution Place 1 drop into both eyes at bedtime.   rosuvastatin (CRESTOR) 5 MG tablet Take 1 tablet (5 mg total) by mouth daily.   senna (SENOKOT) 8.6 MG TABS tablet Take 1 tablet by mouth daily as needed for mild  constipation.   No facility-administered encounter medications on file as of 05/10/2022.    Review of Systems:  Review of Systems  Constitutional:  Negative for activity change and appetite change.  HENT: Negative.    Respiratory:  Positive for cough. Negative for shortness of breath.   Cardiovascular:  Negative for leg swelling.  Gastrointestinal:  Negative for constipation.  Genitourinary: Negative.   Musculoskeletal:  Positive for arthralgias, back pain and gait problem. Negative for myalgias.  Skin: Negative.   Neurological:  Negative for dizziness and weakness.  Psychiatric/Behavioral:  Positive for confusion. Negative for dysphoric mood and sleep disturbance.     Health Maintenance  Topic Date Due   COVID-19 Vaccine (7 - 2023-24 season) 05/02/2022   Medicare Annual Wellness (AWV)  12/28/2022   DTaP/Tdap/Td (3 - Td or Tdap) 05/03/2025   Pneumonia Vaccine 64+ Years old  Completed   INFLUENZA VACCINE  Completed   DEXA SCAN  Completed   Zoster Vaccines- Shingrix  Completed   HPV VACCINES  Aged Out    Physical Exam: Vitals:   05/10/22 1257  BP: 124/62  Pulse: 83  Resp: 17  Temp: 97.9 F (36.6 C)  TempSrc: Temporal  SpO2: 97%  Weight: 159 lb 9.6 oz (72.4 kg)  Height: '5\' 2"'$  (1.575 m)   Body mass index is 29.19 kg/m. Physical Exam Vitals reviewed.  Constitutional:      Appearance: Normal appearance.  HENT:     Head: Normocephalic.     Nose: Congestion present.     Mouth/Throat:     Mouth: Mucous membranes are moist.     Pharynx: Oropharynx is clear.  Eyes:     Pupils: Pupils are equal, round, and reactive to light.  Cardiovascular:     Rate and Rhythm: Normal rate and regular rhythm.     Pulses: Normal pulses.     Heart sounds: Normal heart sounds. No murmur heard. Pulmonary:     Effort: Pulmonary effort is normal.     Breath sounds: Normal breath sounds.  Abdominal:     General: Abdomen is flat. Bowel sounds are normal.     Palpations: Abdomen is soft.   Musculoskeletal:        General: No swelling.     Cervical back: Neck supple.  Skin:    General: Skin is warm.  Neurological:     General: No focal deficit present.     Mental Status: She is alert.  Psychiatric:        Mood and Affect: Mood normal.        Thought Content: Thought content normal.   .mmse  Labs reviewed: Basic Metabolic Panel: Recent Labs    10/20/21 0820  04/20/22 0820  NA 136 140  K 4.6 4.5  CL 104 107  CO2 26 24  GLUCOSE 82 83  BUN 14 23  CREATININE 0.84 0.86  CALCIUM 9.8 10.0  TSH 5.98* 6.40*   Liver Function Tests: Recent Labs    10/20/21 0820 04/20/22 0820  AST 16 15  ALT 16 12  BILITOT 0.4 0.3  PROT 6.7 6.6   No results for input(s): "LIPASE", "AMYLASE" in the last 8760 hours. No results for input(s): "AMMONIA" in the last 8760 hours. CBC: Recent Labs    10/20/21 0820 04/20/22 0820  WBC 8.5 8.7  NEUTROABS 4,667 4,568  HGB 13.3 12.5  HCT 40.3 37.7  MCV 90.4 91.7  PLT 316 288   Lipid Panel: Recent Labs    10/20/21 0820 04/20/22 0820  CHOL 272* 189  HDL 63 57  LDLCALC 180* 110*  TRIG 150* 113  CHOLHDL 4.3 3.3   No results found for: "HGBA1C"  Procedures since last visit: No results found.  Assessment/Plan 1. Subacute cough Lungs Clear Will try Mucinex DM and Flonase   2. Mid back pain on left side ? Muscle Strain Voltaren Gel Most likely due to her posture using Walker Can take Tylenol PRN  3. Pain of right heel Follow up with Dr Earleen Newport for Fascitis Can take Aleve for sever pain and Use Voltaren as needed   4. Hyperlipidemia with target LDL less than 100 Will Hold statin right now due to generalized fatigue Daughter will let me know next visit how she feels  5. Elevated TSH Continue to monitor 6 H/O alcohol abuse none for last few years Takes gabapentin at night   7Cognitive impairment Last MMSE 24/30 Staying very high functioning   Labs/tests ordered:  * No order type specified * Next appt:   08/10/2022

## 2022-05-10 NOTE — Patient Instructions (Signed)
Mucinex DM 2/day as needed Restart Flonase Can take Naprosyn/alleve with food once a day for few days to help with heel pain

## 2022-05-16 DIAGNOSIS — R262 Difficulty in walking, not elsewhere classified: Secondary | ICD-10-CM | POA: Diagnosis not present

## 2022-05-16 DIAGNOSIS — R2681 Unsteadiness on feet: Secondary | ICD-10-CM | POA: Diagnosis not present

## 2022-05-16 DIAGNOSIS — M722 Plantar fascial fibromatosis: Secondary | ICD-10-CM | POA: Diagnosis not present

## 2022-05-17 ENCOUNTER — Ambulatory Visit (INDEPENDENT_AMBULATORY_CARE_PROVIDER_SITE_OTHER): Payer: Medicare Other

## 2022-05-17 DIAGNOSIS — M722 Plantar fascial fibromatosis: Secondary | ICD-10-CM

## 2022-05-17 NOTE — Progress Notes (Signed)
Patient presents today to be casted for custom molded orthotics. Jacqualyn Posey is the treating physician.  Impression foam cast was taken. ABN signed.  Patient info-  Shoe size: 8.5 M  Shoe style: ATHLETIC  Height: 5FT 4IN  Weight: 165 LBS  Insurance: MEDICARE   Patient will be notified once orthotics arrive in office and reappoint for fitting at that time.

## 2022-05-18 DIAGNOSIS — R262 Difficulty in walking, not elsewhere classified: Secondary | ICD-10-CM | POA: Diagnosis not present

## 2022-05-18 DIAGNOSIS — M722 Plantar fascial fibromatosis: Secondary | ICD-10-CM | POA: Diagnosis not present

## 2022-05-18 DIAGNOSIS — R2681 Unsteadiness on feet: Secondary | ICD-10-CM | POA: Diagnosis not present

## 2022-05-23 DIAGNOSIS — M722 Plantar fascial fibromatosis: Secondary | ICD-10-CM | POA: Diagnosis not present

## 2022-05-23 DIAGNOSIS — R262 Difficulty in walking, not elsewhere classified: Secondary | ICD-10-CM | POA: Diagnosis not present

## 2022-05-23 DIAGNOSIS — R2681 Unsteadiness on feet: Secondary | ICD-10-CM | POA: Diagnosis not present

## 2022-05-25 DIAGNOSIS — M722 Plantar fascial fibromatosis: Secondary | ICD-10-CM | POA: Diagnosis not present

## 2022-05-25 DIAGNOSIS — R2681 Unsteadiness on feet: Secondary | ICD-10-CM | POA: Diagnosis not present

## 2022-05-25 DIAGNOSIS — R262 Difficulty in walking, not elsewhere classified: Secondary | ICD-10-CM | POA: Diagnosis not present

## 2022-05-30 DIAGNOSIS — R2681 Unsteadiness on feet: Secondary | ICD-10-CM | POA: Diagnosis not present

## 2022-05-30 DIAGNOSIS — M722 Plantar fascial fibromatosis: Secondary | ICD-10-CM | POA: Diagnosis not present

## 2022-05-30 DIAGNOSIS — R262 Difficulty in walking, not elsewhere classified: Secondary | ICD-10-CM | POA: Diagnosis not present

## 2022-06-01 DIAGNOSIS — R2681 Unsteadiness on feet: Secondary | ICD-10-CM | POA: Diagnosis not present

## 2022-06-01 DIAGNOSIS — R262 Difficulty in walking, not elsewhere classified: Secondary | ICD-10-CM | POA: Diagnosis not present

## 2022-06-01 DIAGNOSIS — M722 Plantar fascial fibromatosis: Secondary | ICD-10-CM | POA: Diagnosis not present

## 2022-06-06 ENCOUNTER — Ambulatory Visit (INDEPENDENT_AMBULATORY_CARE_PROVIDER_SITE_OTHER): Payer: Medicare Other | Admitting: Podiatry

## 2022-06-06 DIAGNOSIS — R262 Difficulty in walking, not elsewhere classified: Secondary | ICD-10-CM | POA: Diagnosis not present

## 2022-06-06 DIAGNOSIS — M722 Plantar fascial fibromatosis: Secondary | ICD-10-CM

## 2022-06-06 DIAGNOSIS — R2681 Unsteadiness on feet: Secondary | ICD-10-CM | POA: Diagnosis not present

## 2022-06-06 MED ORDER — TRIAMCINOLONE ACETONIDE 10 MG/ML IJ SUSP
10.0000 mg | Freq: Once | INTRAMUSCULAR | Status: AC
  Administered 2022-06-06: 10 mg

## 2022-06-06 NOTE — Patient Instructions (Signed)

## 2022-06-08 DIAGNOSIS — R2681 Unsteadiness on feet: Secondary | ICD-10-CM | POA: Diagnosis not present

## 2022-06-08 DIAGNOSIS — R262 Difficulty in walking, not elsewhere classified: Secondary | ICD-10-CM | POA: Diagnosis not present

## 2022-06-08 DIAGNOSIS — M722 Plantar fascial fibromatosis: Secondary | ICD-10-CM | POA: Diagnosis not present

## 2022-06-11 NOTE — Progress Notes (Signed)
Subjective: Chief Complaint  Patient presents with   Plantar Fasciitis    Right foot, left foot is doing very well    89 year old female presents the office to the above concerns with her daughter.  Requesting steroid injection of the right heel today.  She is awaiting orthotics.  She still doing physical therapy.  No recent injury or changes.  No other concerns.    Objective: AAO x3, NAD DP/PT pulses palpable bilaterally, CRT less than 3 seconds There is continuation of tenderness to palpation along the plantar medial tubercle of the calcaneus at the insertion of plantar fascia on the right foot. There is no pain along the course of the plantar fascia within the arch of the foot. Plantar fascia appears to be intact. There is no pain with lateral compression of the calcaneus or pain with vibratory sensation. There is no pain along the course or insertion of the achilles tendon. No other areas of tenderness to bilateral lower extremities.  No open lesions or pre-ulcerative lesions.  No pain with calf compression, swelling, warmth, erythema  Assessment: Right heel pain, Plantar fasciitis  Plan: -All treatment options discussed with the patient including all alternatives, risks, complications.  -Steroid injection performed of the right foot today.  See procedure note below. -Continue stretching, icing, physical therapy.  Awaiting orthotics.  Return in about 6 weeks (around 07/18/2022), or if symptoms worsen or fail to improve, for Right heel pain plantar fasciitis .  Trula Slade DPM

## 2022-06-13 DIAGNOSIS — M722 Plantar fascial fibromatosis: Secondary | ICD-10-CM | POA: Diagnosis not present

## 2022-06-13 DIAGNOSIS — R262 Difficulty in walking, not elsewhere classified: Secondary | ICD-10-CM | POA: Diagnosis not present

## 2022-06-13 DIAGNOSIS — R2681 Unsteadiness on feet: Secondary | ICD-10-CM | POA: Diagnosis not present

## 2022-06-15 ENCOUNTER — Encounter: Payer: Self-pay | Admitting: Podiatry

## 2022-06-15 DIAGNOSIS — R262 Difficulty in walking, not elsewhere classified: Secondary | ICD-10-CM | POA: Diagnosis not present

## 2022-06-15 DIAGNOSIS — M722 Plantar fascial fibromatosis: Secondary | ICD-10-CM | POA: Diagnosis not present

## 2022-06-15 DIAGNOSIS — R2681 Unsteadiness on feet: Secondary | ICD-10-CM | POA: Diagnosis not present

## 2022-06-20 DIAGNOSIS — R2681 Unsteadiness on feet: Secondary | ICD-10-CM | POA: Diagnosis not present

## 2022-06-20 DIAGNOSIS — M722 Plantar fascial fibromatosis: Secondary | ICD-10-CM | POA: Diagnosis not present

## 2022-06-20 DIAGNOSIS — R262 Difficulty in walking, not elsewhere classified: Secondary | ICD-10-CM | POA: Diagnosis not present

## 2022-06-21 DIAGNOSIS — H40053 Ocular hypertension, bilateral: Secondary | ICD-10-CM | POA: Diagnosis not present

## 2022-06-21 DIAGNOSIS — Z961 Presence of intraocular lens: Secondary | ICD-10-CM | POA: Diagnosis not present

## 2022-06-21 DIAGNOSIS — H04123 Dry eye syndrome of bilateral lacrimal glands: Secondary | ICD-10-CM | POA: Diagnosis not present

## 2022-06-22 DIAGNOSIS — M722 Plantar fascial fibromatosis: Secondary | ICD-10-CM | POA: Diagnosis not present

## 2022-06-22 DIAGNOSIS — R262 Difficulty in walking, not elsewhere classified: Secondary | ICD-10-CM | POA: Diagnosis not present

## 2022-06-22 DIAGNOSIS — R2681 Unsteadiness on feet: Secondary | ICD-10-CM | POA: Diagnosis not present

## 2022-06-27 DIAGNOSIS — M722 Plantar fascial fibromatosis: Secondary | ICD-10-CM | POA: Diagnosis not present

## 2022-06-27 DIAGNOSIS — R262 Difficulty in walking, not elsewhere classified: Secondary | ICD-10-CM | POA: Diagnosis not present

## 2022-06-27 DIAGNOSIS — R2681 Unsteadiness on feet: Secondary | ICD-10-CM | POA: Diagnosis not present

## 2022-06-29 DIAGNOSIS — R2681 Unsteadiness on feet: Secondary | ICD-10-CM | POA: Diagnosis not present

## 2022-06-29 DIAGNOSIS — M722 Plantar fascial fibromatosis: Secondary | ICD-10-CM | POA: Diagnosis not present

## 2022-06-29 DIAGNOSIS — R262 Difficulty in walking, not elsewhere classified: Secondary | ICD-10-CM | POA: Diagnosis not present

## 2022-07-04 DIAGNOSIS — R2681 Unsteadiness on feet: Secondary | ICD-10-CM | POA: Diagnosis not present

## 2022-07-04 DIAGNOSIS — R262 Difficulty in walking, not elsewhere classified: Secondary | ICD-10-CM | POA: Diagnosis not present

## 2022-07-04 DIAGNOSIS — R41841 Cognitive communication deficit: Secondary | ICD-10-CM | POA: Diagnosis not present

## 2022-07-04 DIAGNOSIS — M722 Plantar fascial fibromatosis: Secondary | ICD-10-CM | POA: Diagnosis not present

## 2022-07-06 DIAGNOSIS — R41841 Cognitive communication deficit: Secondary | ICD-10-CM | POA: Diagnosis not present

## 2022-07-06 DIAGNOSIS — R262 Difficulty in walking, not elsewhere classified: Secondary | ICD-10-CM | POA: Diagnosis not present

## 2022-07-06 DIAGNOSIS — M722 Plantar fascial fibromatosis: Secondary | ICD-10-CM | POA: Diagnosis not present

## 2022-07-06 DIAGNOSIS — R2681 Unsteadiness on feet: Secondary | ICD-10-CM | POA: Diagnosis not present

## 2022-07-11 DIAGNOSIS — M722 Plantar fascial fibromatosis: Secondary | ICD-10-CM | POA: Diagnosis not present

## 2022-07-11 DIAGNOSIS — R41841 Cognitive communication deficit: Secondary | ICD-10-CM | POA: Diagnosis not present

## 2022-07-11 DIAGNOSIS — R262 Difficulty in walking, not elsewhere classified: Secondary | ICD-10-CM | POA: Diagnosis not present

## 2022-07-11 DIAGNOSIS — R2681 Unsteadiness on feet: Secondary | ICD-10-CM | POA: Diagnosis not present

## 2022-07-13 DIAGNOSIS — R262 Difficulty in walking, not elsewhere classified: Secondary | ICD-10-CM | POA: Diagnosis not present

## 2022-07-13 DIAGNOSIS — R41841 Cognitive communication deficit: Secondary | ICD-10-CM | POA: Diagnosis not present

## 2022-07-13 DIAGNOSIS — R2681 Unsteadiness on feet: Secondary | ICD-10-CM | POA: Diagnosis not present

## 2022-07-13 DIAGNOSIS — M722 Plantar fascial fibromatosis: Secondary | ICD-10-CM | POA: Diagnosis not present

## 2022-07-19 ENCOUNTER — Ambulatory Visit (INDEPENDENT_AMBULATORY_CARE_PROVIDER_SITE_OTHER): Payer: Medicare Other

## 2022-07-19 DIAGNOSIS — M722 Plantar fascial fibromatosis: Secondary | ICD-10-CM | POA: Diagnosis not present

## 2022-07-19 NOTE — Progress Notes (Signed)
Patient presents today to pick up custom molded foot orthotics recommended by Dr. WAGONER.   Orthotics were dispensed and fit was satisfactory. Reviewed instructions for break-in and wear. Written instructions given to patient.  Patient will follow up as needed.     

## 2022-07-20 DIAGNOSIS — R262 Difficulty in walking, not elsewhere classified: Secondary | ICD-10-CM | POA: Diagnosis not present

## 2022-07-20 DIAGNOSIS — R41841 Cognitive communication deficit: Secondary | ICD-10-CM | POA: Diagnosis not present

## 2022-07-20 DIAGNOSIS — R2681 Unsteadiness on feet: Secondary | ICD-10-CM | POA: Diagnosis not present

## 2022-07-20 DIAGNOSIS — M722 Plantar fascial fibromatosis: Secondary | ICD-10-CM | POA: Diagnosis not present

## 2022-07-25 DIAGNOSIS — R2681 Unsteadiness on feet: Secondary | ICD-10-CM | POA: Diagnosis not present

## 2022-07-25 DIAGNOSIS — M722 Plantar fascial fibromatosis: Secondary | ICD-10-CM | POA: Diagnosis not present

## 2022-07-25 DIAGNOSIS — R41841 Cognitive communication deficit: Secondary | ICD-10-CM | POA: Diagnosis not present

## 2022-07-25 DIAGNOSIS — R262 Difficulty in walking, not elsewhere classified: Secondary | ICD-10-CM | POA: Diagnosis not present

## 2022-07-26 DIAGNOSIS — R262 Difficulty in walking, not elsewhere classified: Secondary | ICD-10-CM | POA: Diagnosis not present

## 2022-07-26 DIAGNOSIS — R2681 Unsteadiness on feet: Secondary | ICD-10-CM | POA: Diagnosis not present

## 2022-07-26 DIAGNOSIS — M722 Plantar fascial fibromatosis: Secondary | ICD-10-CM | POA: Diagnosis not present

## 2022-07-26 DIAGNOSIS — R41841 Cognitive communication deficit: Secondary | ICD-10-CM | POA: Diagnosis not present

## 2022-08-01 DIAGNOSIS — R41841 Cognitive communication deficit: Secondary | ICD-10-CM | POA: Diagnosis not present

## 2022-08-03 DIAGNOSIS — R41841 Cognitive communication deficit: Secondary | ICD-10-CM | POA: Diagnosis not present

## 2022-08-07 ENCOUNTER — Other Ambulatory Visit: Payer: Self-pay | Admitting: Internal Medicine

## 2022-08-07 DIAGNOSIS — R41841 Cognitive communication deficit: Secondary | ICD-10-CM | POA: Diagnosis not present

## 2022-08-07 DIAGNOSIS — R42 Dizziness and giddiness: Secondary | ICD-10-CM

## 2022-08-07 DIAGNOSIS — E785 Hyperlipidemia, unspecified: Secondary | ICD-10-CM

## 2022-08-07 DIAGNOSIS — F339 Major depressive disorder, recurrent, unspecified: Secondary | ICD-10-CM

## 2022-08-07 DIAGNOSIS — R7989 Other specified abnormal findings of blood chemistry: Secondary | ICD-10-CM

## 2022-08-10 ENCOUNTER — Other Ambulatory Visit: Payer: Medicare Other

## 2022-08-10 DIAGNOSIS — F339 Major depressive disorder, recurrent, unspecified: Secondary | ICD-10-CM | POA: Diagnosis not present

## 2022-08-10 DIAGNOSIS — R7989 Other specified abnormal findings of blood chemistry: Secondary | ICD-10-CM | POA: Diagnosis not present

## 2022-08-10 DIAGNOSIS — E785 Hyperlipidemia, unspecified: Secondary | ICD-10-CM | POA: Diagnosis not present

## 2022-08-10 DIAGNOSIS — R42 Dizziness and giddiness: Secondary | ICD-10-CM | POA: Diagnosis not present

## 2022-08-10 DIAGNOSIS — R41841 Cognitive communication deficit: Secondary | ICD-10-CM | POA: Diagnosis not present

## 2022-08-10 LAB — COMPLETE METABOLIC PANEL WITH GFR
Albumin: 4.3 g/dL (ref 3.6–5.1)
Potassium: 4.3 mmol/L (ref 3.5–5.3)
Sodium: 139 mmol/L (ref 135–146)
Total Bilirubin: 0.5 mg/dL (ref 0.2–1.2)

## 2022-08-10 LAB — CBC WITH DIFFERENTIAL/PLATELET
Basophils Absolute: 79 cells/uL (ref 0–200)
Eosinophils Relative: 3.3 %
HCT: 39.8 % (ref 35.0–45.0)
MCHC: 33.4 g/dL (ref 32.0–36.0)
Neutro Abs: 4519 cells/uL (ref 1500–7800)
Neutrophils Relative %: 57.2 %
Platelets: 301 10*3/uL (ref 140–400)

## 2022-08-11 LAB — LIPID PANEL
Cholesterol: 282 mg/dL — ABNORMAL HIGH (ref <200)
HDL: 56 mg/dL (ref >=50)
LDL Cholesterol (Calc): 195 mg/dL (calc) — ABNORMAL HIGH
Non-HDL Cholesterol (Calc): 226 mg/dL (calc) — ABNORMAL HIGH (ref <130)
Total CHOL/HDL Ratio: 5 (calc) — ABNORMAL HIGH (ref <5.0)
Triglycerides: 149 mg/dL (ref <150)

## 2022-08-11 LAB — CBC WITH DIFFERENTIAL/PLATELET
Absolute Monocytes: 679 cells/uL (ref 200–950)
Basophils Relative: 1 %
Eosinophils Absolute: 261 cells/uL (ref 15–500)
Hemoglobin: 13.3 g/dL (ref 11.7–15.5)
Lymphs Abs: 2362 cells/uL (ref 850–3900)
MCH: 30.8 pg (ref 27.0–33.0)
MCV: 92.1 fL (ref 80.0–100.0)
MPV: 11.3 fL (ref 7.5–12.5)
Monocytes Relative: 8.6 %
RBC: 4.32 10*6/uL (ref 3.80–5.10)
RDW: 13.4 % (ref 11.0–15.0)
Total Lymphocyte: 29.9 %
WBC: 7.9 10*3/uL (ref 3.8–10.8)

## 2022-08-11 LAB — COMPLETE METABOLIC PANEL WITH GFR
AG Ratio: 1.9 (calc) (ref 1.0–2.5)
ALT: 13 U/L (ref 6–29)
AST: 15 U/L (ref 10–35)
Alkaline phosphatase (APISO): 78 U/L (ref 37–153)
BUN: 15 mg/dL (ref 7–25)
CO2: 28 mmol/L (ref 20–32)
Calcium: 10.4 mg/dL (ref 8.6–10.4)
Chloride: 105 mmol/L (ref 98–110)
Creat: 0.87 mg/dL (ref 0.60–0.95)
Globulin: 2.3 g/dL (calc) (ref 1.9–3.7)
Glucose, Bld: 92 mg/dL (ref 65–99)
Total Protein: 6.6 g/dL (ref 6.1–8.1)
eGFR: 64 mL/min/{1.73_m2} (ref >=60)

## 2022-08-11 LAB — TSH: TSH: 6.09 mIU/L — ABNORMAL HIGH (ref 0.40–4.50)

## 2022-08-15 DIAGNOSIS — R41841 Cognitive communication deficit: Secondary | ICD-10-CM | POA: Diagnosis not present

## 2022-08-17 DIAGNOSIS — R41841 Cognitive communication deficit: Secondary | ICD-10-CM | POA: Diagnosis not present

## 2022-08-22 DIAGNOSIS — R41841 Cognitive communication deficit: Secondary | ICD-10-CM | POA: Diagnosis not present

## 2022-08-23 ENCOUNTER — Non-Acute Institutional Stay: Payer: Medicare Other | Admitting: Internal Medicine

## 2022-08-23 ENCOUNTER — Encounter: Payer: Self-pay | Admitting: Internal Medicine

## 2022-08-23 VITALS — BP 126/74 | HR 72 | Temp 97.6°F | Resp 17 | Ht 62.0 in | Wt 161.9 lb

## 2022-08-23 DIAGNOSIS — R7989 Other specified abnormal findings of blood chemistry: Secondary | ICD-10-CM

## 2022-08-23 DIAGNOSIS — H6521 Chronic serous otitis media, right ear: Secondary | ICD-10-CM | POA: Diagnosis not present

## 2022-08-23 DIAGNOSIS — E785 Hyperlipidemia, unspecified: Secondary | ICD-10-CM

## 2022-08-23 DIAGNOSIS — R4189 Other symptoms and signs involving cognitive functions and awareness: Secondary | ICD-10-CM | POA: Diagnosis not present

## 2022-08-23 DIAGNOSIS — M720 Palmar fascial fibromatosis [Dupuytren]: Secondary | ICD-10-CM

## 2022-08-23 DIAGNOSIS — F339 Major depressive disorder, recurrent, unspecified: Secondary | ICD-10-CM | POA: Diagnosis not present

## 2022-08-23 DIAGNOSIS — F1011 Alcohol abuse, in remission: Secondary | ICD-10-CM | POA: Diagnosis not present

## 2022-08-23 NOTE — Progress Notes (Signed)
Location:  [facility] Service:  Clinic (12)  Provider:   Code Status: DNR Goals of Care:     08/23/2022    2:11 PM  Advanced Directives  Does Patient Have a Medical Advance Directive? Yes  Type of Estate agent of [place];Living will;Out of facility DNR (pink MOST or yellow form)  Does patient want to make changes to medical advance directive? No - Patient declined     Chief Complaint  Patient presents with   Medical Management of Chronic Issues    3 month follow up with labs'    HPI: Patient is a 89 y.o. female seen today for medical management of chronic diseases.    Lives in [place]    Patient has history of hyper lipidemia, arthritis status post right hip arthroplasty,  essential tremors, anxiety and constipation H/o Alcohol Abuse. Quit since 12/20 Also Bilateral Shoulder Pain Right ear chronic pain Plantar Fascitis  Daughter with her  Continues to do well Works with Speech as needed Keeps  up with her apartment Repeats herself   Past Medical History:  Diagnosis Date   Allergic rhinitis due to pollen    Anxiety state, unspecified    Arthritis    RIGHT LEG, HANDS.   Cancer    FACE/NOSE -SKIN-squamous cell and basal cell.   Disorder of bone and cartilage, unspecified    Lumbago    Lyme borreliosis    Other atopic dermatitis and related conditions    Other dysphagia    Reflux esophagitis    Senile osteoporosis    TMJ dysfunction    MORE ON LEFT "WEARS BITE PROTECTION AT NIGHT"    Past Surgical History:  Procedure Laterality Date   APPENDECTOMY  1950   CATARACT EXTRACTION, BILATERAL Bilateral    EYE SURGERY Right    right eye ptrigium excision x2   HERNIA REPAIR Bilateral    TOTAL HIP ARTHROPLASTY Right 12/10/2015   Procedure: RIGHT TOTAL HIP ARTHROPLASTY ANTERIOR APPROACH;  Surgeon: Kathryne Hitch, MD;  Location: [facility];  Service: Orthopedics;  Laterality: Right;  Spinal to General    Allergies   Allergen Reactions   Brandy [Alcohol] Anaphylaxis    Related to grapes   Prune Anaphylaxis    PLUMS,GRAPES,RAISINS   Aspirin Other (See Comments)    Stomach bleed   Augmentin [Amoxicillin-Pot Clavulanate] Hives and Itching   Clindamycin Other (See Comments)    unkown   Omnicef [Cefdinir] Hives   Vinegar [Acetic Acid]     basalmic   Alprazolam Rash   Ceftin [Cefuroxime Axetil] Rash   Doxycycline Rash   Eryc [Erythromycin] Rash   Penicillins Rash    Outpatient Encounter Medications as of 08/23/2022  Medication Sig   acetaminophen (TYLENOL) 325 MG tablet Take 650 mg by mouth every 6 (six) hours as needed for mild pain.   cholecalciferol (VITAMIN D) 1000 UNITS tablet Take 1,000 Units by mouth daily.   diclofenac Sodium (VOLTAREN) 1 % GEL Apply 2 g topically 4 (four) times daily. Rub into affected area of foot 2 to 4 times daily   dorzolamide-timolol (COSOPT) 2-0.5 % ophthalmic solution 1 drop 2 (two) times daily.   EPINEPHrine (EPIPEN 2-PAK) 0.3 mg/0.3 mL IJ SOAJ injection Inject 0.3 mg into the muscle as needed for anaphylaxis.   fluticasone (FLONASE) 50 MCG/ACT nasal spray Place 2 sprays into both nostrils daily.   gabapentin (NEURONTIN) 100 MG capsule Take 1 capsule (100 mg total) by mouth at bedtime.  GARLIC PO Take 1 tablet by mouth daily.   latanoprost (XALATAN) 0.005 % ophthalmic solution Place 1 drop into both eyes at bedtime.   rosuvastatin (CRESTOR) 5 MG tablet Take 1 tablet (5 mg total) by mouth daily.   senna (SENOKOT) 8.6 MG TABS tablet Take 1 tablet by mouth daily as needed for mild constipation.   No facility-administered encounter medications on file as of 08/23/2022.    Review of Systems:  Review of Systems  Constitutional:  Negative for activity change and appetite change.  HENT: Negative.    Respiratory:  Negative for cough and shortness of breath.   Cardiovascular:  Negative for leg swelling.  Gastrointestinal:  Negative for constipation.  Genitourinary:  Negative.   Musculoskeletal:  Negative for arthralgias, gait problem and myalgias.  Skin: Negative.   Neurological:  Negative for dizziness and weakness.  Psychiatric/Behavioral:  Negative for confusion, dysphoric mood and sleep disturbance.     Health Maintenance  Topic Date Due   COVID-19 Vaccine (7 - 2023-24 season) 09/08/2022 (Originally 05/02/2022)   INFLUENZA VACCINE  11/30/2022   Medicare Annual Wellness (AWV)  12/28/2022   DTaP/Tdap/Td (3 - Td or Tdap) 05/03/2025   Pneumonia Vaccine 53+ Years old  Completed   DEXA SCAN  Completed   Zoster Vaccines- Shingrix  Completed   HPV VACCINES  Aged Out    Physical Exam: Vitals:   08/23/22 1407  BP: 126/74  Pulse: 72  Resp: 17  Temp: 97.6 F (36.4 C)  TempSrc: Temporal  SpO2: 96%  Weight: 161 lb 14.4 oz (73.4 kg)  Height: 5\' 2"  (1.575 m)   Body mass index is 29.61 kg/m. Physical Exam Vitals reviewed.  Constitutional:      Appearance: Normal appearance.  HENT:     Head: Normocephalic.     Nose: Nose normal.     Mouth/Throat:     Mouth: Mucous membranes are moist.     Pharynx: Oropharynx is clear.  Eyes:     Pupils: Pupils are equal, round, and reactive to light.  Cardiovascular:     Rate and Rhythm: Normal rate and regular rhythm.     Pulses: Normal pulses.     Heart sounds: Normal heart sounds. No murmur heard. Pulmonary:     Effort: Pulmonary effort is normal.     Breath sounds: Normal breath sounds.  Abdominal:     General: Abdomen is flat. Bowel sounds are normal.     Palpations: Abdomen is soft.  Musculoskeletal:        General: No swelling.     Cervical back: Neck supple.  Skin:    General: Skin is warm.  Neurological:     General: No focal deficit present.     Mental Status: She is alert and oriented to person, place, and time.  Psychiatric:        Mood and Affect: Mood normal.        Thought Content: Thought content normal.     Labs reviewed: Basic Metabolic Panel: Recent Labs     10/20/21 0820 04/20/22 0820 08/10/22 0753  NA 136 140 139  K 4.6 4.5 4.3  CL 104 107 105  CO2 26 24 28   GLUCOSE 82 83 92  BUN 14 23 15   CREATININE 0.84 0.86 0.87  CALCIUM 9.8 10.0 10.4  TSH 5.98* 6.40* 6.09*   Liver Function Tests: Recent Labs    10/20/21 0820 04/20/22 0820 08/10/22 0753  AST 16 15 15   ALT 16 12 13   BILITOT 0.4  0.3 0.5  PROT 6.7 6.6 6.6   No results for input(s): "LIPASE", "AMYLASE" in the last 8760 hours. No results for input(s): "AMMONIA" in the last 8760 hours. CBC: Recent Labs    10/20/21 0820 04/20/22 0820 08/10/22 0753  WBC 8.5 8.7 7.9  NEUTROABS 4,667 4,568 4,519  HGB 13.3 12.5 13.3  HCT 40.3 37.7 39.8  MCV 90.4 91.7 92.1  PLT 316 288 301   Lipid Panel: Recent Labs    10/20/21 0820 04/20/22 0820 08/10/22 0753  CHOL 272* 189 282*  HDL 63 57 56  LDLCALC 180* 110* 195*  TRIG 150* 113 149  CHOLHDL 4.3 3.3 5.0*   No results found for: "HGBA1C"  Procedures since last visit: No results found.  Assessment/Plan 1. Elevated TSH Follow  2. Depression, recurrent (HCC) Mood stable Doing well  3. Hyperlipidemia with target LDL less than 100 Will try Crestor 3/week Failed QD due to malaise  4. Cognitive impairment Last MMSE 22/30 Staying very high function Repeat next visit  5. Planter Fascitis Doing well  6. Right chronic serous otitis media Restart Nasonex  7. H/O alcohol abuse None Recently Neurontin at night    Labs/tests ordered:  * No order type specified * Next appt:  01/08/2023

## 2022-08-24 DIAGNOSIS — R41841 Cognitive communication deficit: Secondary | ICD-10-CM | POA: Diagnosis not present

## 2022-08-29 DIAGNOSIS — H04123 Dry eye syndrome of bilateral lacrimal glands: Secondary | ICD-10-CM | POA: Diagnosis not present

## 2022-08-29 DIAGNOSIS — Z961 Presence of intraocular lens: Secondary | ICD-10-CM | POA: Diagnosis not present

## 2022-08-29 DIAGNOSIS — H40053 Ocular hypertension, bilateral: Secondary | ICD-10-CM | POA: Diagnosis not present

## 2022-08-29 DIAGNOSIS — R41841 Cognitive communication deficit: Secondary | ICD-10-CM | POA: Diagnosis not present

## 2022-08-31 DIAGNOSIS — R41841 Cognitive communication deficit: Secondary | ICD-10-CM | POA: Diagnosis not present

## 2022-09-05 DIAGNOSIS — R41841 Cognitive communication deficit: Secondary | ICD-10-CM | POA: Diagnosis not present

## 2022-09-07 DIAGNOSIS — R41841 Cognitive communication deficit: Secondary | ICD-10-CM | POA: Diagnosis not present

## 2022-09-12 DIAGNOSIS — R41841 Cognitive communication deficit: Secondary | ICD-10-CM | POA: Diagnosis not present

## 2022-09-14 DIAGNOSIS — R41841 Cognitive communication deficit: Secondary | ICD-10-CM | POA: Diagnosis not present

## 2022-09-19 DIAGNOSIS — R41841 Cognitive communication deficit: Secondary | ICD-10-CM | POA: Diagnosis not present

## 2022-09-19 NOTE — Progress Notes (Unsigned)
   Rachel Morris [birth year] [id number]   History:  89 y.o. G2P2 presents for breast and pelvic exam. Postmenopausal - no HRT, no bleeding. Uses pessary with ring support for cystocele with good relief. She maintains pessary herself. Denies vaginal irritation or discharge. Normal pap and mammogram history. Osteoporosis-declines treatment and repeat screenings.  Gynecologic History No LMP recorded. Patient is postmenopausal.   Last Pap: No longer screening per guidelines Last mammogram: No longer screening per guidelines Last colonoscopy: No longer screening per guidelines Last Dexa: 2017. Results were: t-score -2.6 of left hip  Past medical history, past surgical history, family history and social history were all reviewed and documented in the EPIC chart.  ROS:  A ROS was performed and pertinent positives and negatives are included.  Exam:  There were no vitals filed for this visit.  There is no height or weight on file to calculate BMI.  General appearance:  Normal Thyroid:  Symmetrical, normal in size, without palpable masses or nodularity. Respiratory  Auscultation:  Clear without wheezing or rhonchi Cardiovascular  Auscultation:  Regular rate, without rubs, murmurs or gallops  Edema/varicosities:  Not grossly evident Abdominal  Soft,nontender, without masses, guarding or rebound.  Liver/spleen:  No organomegaly noted  Hernia:  None appreciated  Skin  Inspection:  Grossly normal   Breasts: Examined lying and sitting.   Right: Without masses, retractions, discharge or axillary adenopathy.   Left: Without masses, retractions, discharge or axillary adenopathy. Gentitourinary   Inguinal/mons:  Normal without inguinal adenopathy  External genitalia:  Normal  BUS/Urethra/Skene's glands:  Normal  Vagina:  Atrophic changes, pessary in place  Cervix:  Did not visualize due to pessary  Uterus: Normal in size, shape and contour.  Midline and mobile  Adnexa/parametria:      Rt: Without masses or tenderness.   Lt: Without masses or tenderness.  Anus and perineum: Normal, non-bleeding hemorrhoids  Digital rectal exam: Deferred  Patient informed chaperone available to be present for breast and pelvic exam. Patient has requested no chaperone to be present. Patient has been advised what will be completed during breast and pelvic exam.   Assessment/Plan:  89 y.o. G2P2 for breast and pelvic exam.   Well female exam with routine gynecological exam - Education provided on SBEs, importance of preventative screenings, current guidelines, high calcium diet, regular exercise, and multivitamin daily. Labs with PCP.   Female cystocele - Pessary with ring support in place. She has good relief with this. She cleans and replaces every 2-3 months. Denies vaginal irritation or discharge.   Senile osteoporosis - declines treatment or further screenings.  Screening for cervical cancer - Normal pap history. No longer screening per guidelines.   Screening for breast cancer - Normal mammogram history. No longer screening per guidelines. Normal breast exam today.   Screening for colon cancer - No longer screening per guidelines.   Follow up in 2 years for CE.     Olivia Mackie [facility], 4:10 PM 09/19/2022

## 2022-09-20 ENCOUNTER — Ambulatory Visit (INDEPENDENT_AMBULATORY_CARE_PROVIDER_SITE_OTHER): Payer: Medicare Other | Admitting: Nurse Practitioner

## 2022-09-20 ENCOUNTER — Encounter: Payer: Self-pay | Admitting: Nurse Practitioner

## 2022-09-20 VITALS — BP 116/64 | Ht 62.25 in | Wt 157.0 lb

## 2022-09-20 DIAGNOSIS — M810 Age-related osteoporosis without current pathological fracture: Secondary | ICD-10-CM | POA: Diagnosis not present

## 2022-09-20 DIAGNOSIS — N814 Uterovaginal prolapse, unspecified: Secondary | ICD-10-CM | POA: Diagnosis not present

## 2022-09-20 DIAGNOSIS — Z9189 Other specified personal risk factors, not elsewhere classified: Secondary | ICD-10-CM

## 2022-09-20 DIAGNOSIS — Z01419 Encounter for gynecological examination (general) (routine) without abnormal findings: Secondary | ICD-10-CM

## 2022-09-20 DIAGNOSIS — Z780 Asymptomatic menopausal state: Secondary | ICD-10-CM

## 2022-09-21 DIAGNOSIS — L570 Actinic keratosis: Secondary | ICD-10-CM | POA: Diagnosis not present

## 2022-09-21 DIAGNOSIS — L720 Epidermal cyst: Secondary | ICD-10-CM | POA: Diagnosis not present

## 2022-09-21 DIAGNOSIS — L821 Other seborrheic keratosis: Secondary | ICD-10-CM | POA: Diagnosis not present

## 2022-09-21 DIAGNOSIS — Z85828 Personal history of other malignant neoplasm of skin: Secondary | ICD-10-CM | POA: Diagnosis not present

## 2022-09-21 DIAGNOSIS — L578 Other skin changes due to chronic exposure to nonionizing radiation: Secondary | ICD-10-CM | POA: Diagnosis not present

## 2022-09-21 DIAGNOSIS — R41841 Cognitive communication deficit: Secondary | ICD-10-CM | POA: Diagnosis not present

## 2022-09-21 DIAGNOSIS — D2272 Melanocytic nevi of left lower limb, including hip: Secondary | ICD-10-CM | POA: Diagnosis not present

## 2022-09-21 DIAGNOSIS — D224 Melanocytic nevi of scalp and neck: Secondary | ICD-10-CM | POA: Diagnosis not present

## 2022-09-26 DIAGNOSIS — R41841 Cognitive communication deficit: Secondary | ICD-10-CM | POA: Diagnosis not present

## 2022-09-28 DIAGNOSIS — R41841 Cognitive communication deficit: Secondary | ICD-10-CM | POA: Diagnosis not present

## 2022-10-03 DIAGNOSIS — R41841 Cognitive communication deficit: Secondary | ICD-10-CM | POA: Diagnosis not present

## 2022-10-05 DIAGNOSIS — R41841 Cognitive communication deficit: Secondary | ICD-10-CM | POA: Diagnosis not present

## 2022-10-10 DIAGNOSIS — R41841 Cognitive communication deficit: Secondary | ICD-10-CM | POA: Diagnosis not present

## 2022-10-12 DIAGNOSIS — R41841 Cognitive communication deficit: Secondary | ICD-10-CM | POA: Diagnosis not present

## 2022-10-17 DIAGNOSIS — R41841 Cognitive communication deficit: Secondary | ICD-10-CM | POA: Diagnosis not present

## 2022-10-19 DIAGNOSIS — R41841 Cognitive communication deficit: Secondary | ICD-10-CM | POA: Diagnosis not present

## 2022-10-24 DIAGNOSIS — R41841 Cognitive communication deficit: Secondary | ICD-10-CM | POA: Diagnosis not present

## 2022-10-26 DIAGNOSIS — R41841 Cognitive communication deficit: Secondary | ICD-10-CM | POA: Diagnosis not present

## 2022-10-31 DIAGNOSIS — R41841 Cognitive communication deficit: Secondary | ICD-10-CM | POA: Diagnosis not present

## 2022-11-02 DIAGNOSIS — R41841 Cognitive communication deficit: Secondary | ICD-10-CM | POA: Diagnosis not present

## 2022-11-07 ENCOUNTER — Other Ambulatory Visit: Payer: Self-pay | Admitting: Internal Medicine

## 2022-11-07 DIAGNOSIS — R41841 Cognitive communication deficit: Secondary | ICD-10-CM | POA: Diagnosis not present

## 2022-11-09 DIAGNOSIS — R41841 Cognitive communication deficit: Secondary | ICD-10-CM | POA: Diagnosis not present

## 2022-11-14 DIAGNOSIS — R41841 Cognitive communication deficit: Secondary | ICD-10-CM | POA: Diagnosis not present

## 2022-11-16 DIAGNOSIS — R41841 Cognitive communication deficit: Secondary | ICD-10-CM | POA: Diagnosis not present

## 2022-11-21 DIAGNOSIS — R41841 Cognitive communication deficit: Secondary | ICD-10-CM | POA: Diagnosis not present

## 2022-11-23 DIAGNOSIS — R41841 Cognitive communication deficit: Secondary | ICD-10-CM | POA: Diagnosis not present

## 2022-11-28 DIAGNOSIS — R41841 Cognitive communication deficit: Secondary | ICD-10-CM | POA: Diagnosis not present

## 2022-11-30 DIAGNOSIS — R41841 Cognitive communication deficit: Secondary | ICD-10-CM | POA: Diagnosis not present

## 2022-12-05 DIAGNOSIS — R41841 Cognitive communication deficit: Secondary | ICD-10-CM | POA: Diagnosis not present

## 2022-12-07 DIAGNOSIS — R41841 Cognitive communication deficit: Secondary | ICD-10-CM | POA: Diagnosis not present

## 2022-12-12 DIAGNOSIS — R41841 Cognitive communication deficit: Secondary | ICD-10-CM | POA: Diagnosis not present

## 2022-12-14 DIAGNOSIS — R41841 Cognitive communication deficit: Secondary | ICD-10-CM | POA: Diagnosis not present

## 2022-12-19 DIAGNOSIS — R41841 Cognitive communication deficit: Secondary | ICD-10-CM | POA: Diagnosis not present

## 2022-12-21 DIAGNOSIS — R41841 Cognitive communication deficit: Secondary | ICD-10-CM | POA: Diagnosis not present

## 2022-12-26 DIAGNOSIS — R41841 Cognitive communication deficit: Secondary | ICD-10-CM | POA: Diagnosis not present

## 2022-12-28 DIAGNOSIS — R41841 Cognitive communication deficit: Secondary | ICD-10-CM | POA: Diagnosis not present

## 2023-01-02 DIAGNOSIS — R41841 Cognitive communication deficit: Secondary | ICD-10-CM | POA: Diagnosis not present

## 2023-01-05 DIAGNOSIS — R41841 Cognitive communication deficit: Secondary | ICD-10-CM | POA: Diagnosis not present

## 2023-01-08 ENCOUNTER — Non-Acute Institutional Stay (INDEPENDENT_AMBULATORY_CARE_PROVIDER_SITE_OTHER): Payer: Medicare Other | Admitting: Orthopedic Surgery

## 2023-01-08 ENCOUNTER — Encounter: Payer: Self-pay | Admitting: Orthopedic Surgery

## 2023-01-08 VITALS — BP 114/60 | HR 69 | Temp 96.8°F | Resp 16 | Ht 62.25 in | Wt 158.9 lb

## 2023-01-08 DIAGNOSIS — E2839 Other primary ovarian failure: Secondary | ICD-10-CM

## 2023-01-08 DIAGNOSIS — Z0000 Encounter for general adult medical examination without abnormal findings: Secondary | ICD-10-CM

## 2023-01-08 NOTE — Patient Instructions (Addendum)
  Rachel Morris , Thank you for taking time to come for your Medicare Wellness Visit. I appreciate your ongoing commitment to your health goals. Please review the following plan we discussed and let me know if I can assist you in the future.   These are the goals we discussed:  Goals      Lose weight     Pt would like to get down to 132 lbs by walking more.     Patient Stated     Maintain current level of health        This is a list of the screening recommended for you and due dates:  Health Maintenance  Topic Date Due   Flu Shot  11/30/2022   COVID-19 Vaccine (7 - 2023-24 season) 12/31/2022   Medicare Annual Wellness Visit  01/08/2024   DTaP/Tdap/Td vaccine (3 - Td or Tdap) 05/03/2025   Pneumonia Vaccine  Completed   DEXA scan (bone density measurement)  Completed   Zoster (Shingles) Vaccine  Completed   HPV Vaccine  Aged Out   Recommend bone density at [facility]> [phone] please schedule within next year.
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
## 2023-01-09 DIAGNOSIS — R41841 Cognitive communication deficit: Secondary | ICD-10-CM | POA: Diagnosis not present

## 2023-01-11 DIAGNOSIS — R41841 Cognitive communication deficit: Secondary | ICD-10-CM | POA: Diagnosis not present

## 2023-01-18 DIAGNOSIS — R41841 Cognitive communication deficit: Secondary | ICD-10-CM | POA: Diagnosis not present

## 2023-01-23 DIAGNOSIS — R41841 Cognitive communication deficit: Secondary | ICD-10-CM | POA: Diagnosis not present

## 2023-01-25 DIAGNOSIS — R41841 Cognitive communication deficit: Secondary | ICD-10-CM | POA: Diagnosis not present

## 2023-01-30 DIAGNOSIS — R41841 Cognitive communication deficit: Secondary | ICD-10-CM | POA: Diagnosis not present

## 2023-02-01 DIAGNOSIS — R41841 Cognitive communication deficit: Secondary | ICD-10-CM | POA: Diagnosis not present

## 2023-02-06 DIAGNOSIS — R41841 Cognitive communication deficit: Secondary | ICD-10-CM | POA: Diagnosis not present

## 2023-02-08 DIAGNOSIS — R41841 Cognitive communication deficit: Secondary | ICD-10-CM | POA: Diagnosis not present

## 2023-02-22 ENCOUNTER — Other Ambulatory Visit: Payer: Medicare Other

## 2023-02-22 DIAGNOSIS — R7989 Other specified abnormal findings of blood chemistry: Secondary | ICD-10-CM | POA: Diagnosis not present

## 2023-02-22 DIAGNOSIS — R4189 Other symptoms and signs involving cognitive functions and awareness: Secondary | ICD-10-CM | POA: Diagnosis not present

## 2023-02-22 DIAGNOSIS — F339 Major depressive disorder, recurrent, unspecified: Secondary | ICD-10-CM | POA: Diagnosis not present

## 2023-02-22 DIAGNOSIS — E785 Hyperlipidemia, unspecified: Secondary | ICD-10-CM | POA: Diagnosis not present

## 2023-02-23 LAB — CBC WITH DIFFERENTIAL/PLATELET
Absolute Lymphocytes: 2269 {cells}/uL (ref 850–3900)
Absolute Monocytes: 846 {cells}/uL (ref 200–950)
Basophils Absolute: 84 {cells}/uL (ref 0–200)
Basophils Relative: 0.9 %
Eosinophils Absolute: 298 {cells}/uL (ref 15–500)
Eosinophils Relative: 3.2 %
HCT: 41.1 % (ref 35.0–45.0)
Hemoglobin: 13.3 g/dL (ref 11.7–15.5)
MCH: 30.1 pg (ref 27.0–33.0)
MCHC: 32.4 g/dL (ref 32.0–36.0)
MCV: 93 fL (ref 80.0–100.0)
MPV: 11.5 fL (ref 7.5–12.5)
Monocytes Relative: 9.1 %
Neutro Abs: 5803 {cells}/uL (ref 1500–7800)
Neutrophils Relative %: 62.4 %
Platelets: 312 10*3/uL (ref 140–400)
RBC: 4.42 10*6/uL (ref 3.80–5.10)
RDW: 13.2 % (ref 11.0–15.0)
Total Lymphocyte: 24.4 %
WBC: 9.3 10*3/uL (ref 3.8–10.8)

## 2023-02-23 LAB — COMPLETE METABOLIC PANEL WITH GFR
AG Ratio: 1.8 (calc) (ref 1.0–2.5)
ALT: 13 U/L (ref 6–29)
AST: 14 U/L (ref 10–35)
Albumin: 4.2 g/dL (ref 3.6–5.1)
Alkaline phosphatase (APISO): 84 U/L (ref 37–153)
BUN: 19 mg/dL (ref 7–25)
CO2: 24 mmol/L (ref 20–32)
Calcium: 9.8 mg/dL (ref 8.6–10.4)
Chloride: 106 mmol/L (ref 98–110)
Creat: 0.93 mg/dL (ref 0.60–0.95)
Globulin: 2.4 g/dL (ref 1.9–3.7)
Glucose, Bld: 89 mg/dL (ref 65–99)
Potassium: 4.5 mmol/L (ref 3.5–5.3)
Sodium: 140 mmol/L (ref 135–146)
Total Bilirubin: 0.4 mg/dL (ref 0.2–1.2)
Total Protein: 6.6 g/dL (ref 6.1–8.1)
eGFR: 58 mL/min/{1.73_m2} — ABNORMAL LOW (ref >=60)

## 2023-02-23 LAB — LIPID PANEL
Cholesterol: 203 mg/dL — ABNORMAL HIGH (ref <200)
HDL: 51 mg/dL (ref >=50)
LDL Cholesterol (Calc): 122 mg/dL — ABNORMAL HIGH
Non-HDL Cholesterol (Calc): 152 mg/dL — ABNORMAL HIGH (ref <130)
Total CHOL/HDL Ratio: 4 (calc) (ref <5.0)
Triglycerides: 180 mg/dL — ABNORMAL HIGH (ref <150)

## 2023-02-23 LAB — TSH: TSH: 6.15 m[IU]/L — ABNORMAL HIGH (ref 0.40–4.50)

## 2023-02-27 DIAGNOSIS — Z23 Encounter for immunization: Secondary | ICD-10-CM | POA: Diagnosis not present

## 2023-02-28 ENCOUNTER — Encounter: Payer: Self-pay | Admitting: Internal Medicine

## 2023-02-28 ENCOUNTER — Non-Acute Institutional Stay: Payer: Medicare Other | Admitting: Internal Medicine

## 2023-02-28 VITALS — BP 122/78 | HR 62 | Temp 97.6°F | Resp 17 | Ht 62.25 in | Wt 160.0 lb

## 2023-02-28 DIAGNOSIS — I951 Orthostatic hypotension: Secondary | ICD-10-CM | POA: Diagnosis not present

## 2023-02-28 DIAGNOSIS — R7989 Other specified abnormal findings of blood chemistry: Secondary | ICD-10-CM | POA: Diagnosis not present

## 2023-02-28 DIAGNOSIS — R42 Dizziness and giddiness: Secondary | ICD-10-CM

## 2023-02-28 DIAGNOSIS — F1011 Alcohol abuse, in remission: Secondary | ICD-10-CM | POA: Diagnosis not present

## 2023-02-28 DIAGNOSIS — F339 Major depressive disorder, recurrent, unspecified: Secondary | ICD-10-CM | POA: Diagnosis not present

## 2023-02-28 DIAGNOSIS — G4700 Insomnia, unspecified: Secondary | ICD-10-CM

## 2023-02-28 DIAGNOSIS — M720 Palmar fascial fibromatosis [Dupuytren]: Secondary | ICD-10-CM

## 2023-02-28 DIAGNOSIS — R4189 Other symptoms and signs involving cognitive functions and awareness: Secondary | ICD-10-CM | POA: Diagnosis not present

## 2023-02-28 NOTE — Patient Instructions (Signed)
Drink Water Wear Compression stockings If Get dizzy sit down and elevate your legs

## 2023-02-28 NOTE — Progress Notes (Signed)
Location:  [facility] Service:  Clinic (12)  Provider:   Code Status: DNR Goals of Care:     02/28/2023   11:20 AM  Advanced Directives  Does Patient Have a Medical Advance Directive? Yes  Type of Estate agent of [place];Out of facility DNR (pink MOST or yellow form)  Does patient want to make changes to medical advance directive? No - Patient declined  Copy of Healthcare Power of Attorney in Chart? Yes - validated most recent copy scanned in chart (See row information)     Chief Complaint  Patient presents with   Medical Management of Chronic Issues    6 month follow up with labs .patient wants to ask about bone scan     HPI: Patient is a 89 y.o. female seen today for medical management of chronic diseases.   Lives in [place]    Patient has history of hyper lipidemia, arthritis status post right hip arthroplasty,  essential tremors, anxiety and constipation H/o Alcohol Abuse. Quit since 12/20 Also Bilateral Shoulder Pain Right ear chronic pain Plantar Fascitis  Daughter with her  Had acute issues Dizziness While in the clinic patient started complaining of dizziness.  She was sitting in the exam table.  Her systolic blood pressure dropped to 80s We elevated her legs and gave her some water.  And the blood pressure did come up to 100 .  Patient does have a history of chronic dizziness.  Her orthostatics have been negative in the past.  Per daughter she sometimes complains of lightheadedness after standing for a long time. Worsening cognition Is able to maintain her ADLs  Her daughter is involved.  She does not drive anymore.  Daughter takes care of everything else.   Past Medical History:  Diagnosis Date   Allergic rhinitis due to pollen    Anxiety state, unspecified    Arthritis    RIGHT LEG, HANDS.   Cancer (HCC)    FACE/NOSE -SKIN-squamous cell and basal cell.   Disorder of bone and cartilage, unspecified    Lumbago     Lyme borreliosis    Other atopic dermatitis and related conditions    Other dysphagia    Reflux esophagitis    Senile osteoporosis    TMJ dysfunction    MORE ON LEFT "WEARS BITE PROTECTION AT NIGHT"    Past Surgical History:  Procedure Laterality Date   APPENDECTOMY  1950   CATARACT EXTRACTION, BILATERAL Bilateral    EYE SURGERY Right    right eye ptrigium excision x2   HERNIA REPAIR Bilateral    TOTAL HIP ARTHROPLASTY Right 12/10/2015   Procedure: RIGHT TOTAL HIP ARTHROPLASTY ANTERIOR APPROACH;  Surgeon: Kathryne Hitch, MD;  Location: [facility];  Service: Orthopedics;  Laterality: Right;  Spinal to General    Allergies  Allergen Reactions   Brandy [Alcohol] Anaphylaxis    Related to grapes   Prune Anaphylaxis    PLUMS,GRAPES,RAISINS   Aspirin Other (See Comments)    Stomach bleed   Augmentin [Amoxicillin-Pot Clavulanate] Hives and Itching   Clindamycin Other (See Comments)    unkown   Omnicef [Cefdinir] Hives   Vinegar [Acetic Acid]     basalmic   Alprazolam Rash   Ceftin [Cefuroxime Axetil] Rash   Doxycycline Rash   Eryc [Erythromycin] Rash   Penicillins Rash    Outpatient Encounter Medications as of 02/28/2023  Medication Sig   acetaminophen (TYLENOL) 325 MG tablet Take 650 mg by mouth  every 6 (six) hours as needed for mild pain.   cholecalciferol (VITAMIN D) 1000 UNITS tablet Take 1,000 Units by mouth daily.   dorzolamide-timolol (COSOPT) 2-0.5 % ophthalmic solution 1 drop 2 (two) times daily.   EPINEPHrine (EPIPEN 2-PAK) 0.3 mg/0.3 mL IJ SOAJ injection Inject 0.3 mg into the muscle as needed for anaphylaxis.   fluticasone (FLONASE) 50 MCG/ACT nasal spray Place 2 sprays into both nostrils daily.   gabapentin (NEURONTIN) 100 MG capsule Take 1 capsule (100 mg total) by mouth at bedtime.   GARLIC PO Take 1 tablet by mouth daily.   latanoprost (XALATAN) 0.005 % ophthalmic solution Place 1 drop into both eyes at bedtime.   rosuvastatin (CRESTOR) 5 MG tablet  Take 5 mg by mouth 3 (three) times a week.   senna (SENOKOT) 8.6 MG TABS tablet Take 1 tablet by mouth daily as needed for mild constipation.   No facility-administered encounter medications on file as of 02/28/2023.    Review of Systems:  Review of Systems  Constitutional:  Negative for activity change and appetite change.  HENT: Negative.    Respiratory:  Negative for cough and shortness of breath.   Cardiovascular:  Negative for leg swelling.  Gastrointestinal:  Negative for constipation.  Genitourinary: Negative.   Musculoskeletal:  Positive for gait problem. Negative for arthralgias and myalgias.  Skin: Negative.   Neurological:  Positive for dizziness. Negative for weakness.  Psychiatric/Behavioral:  Positive for confusion. Negative for dysphoric mood and sleep disturbance.     Health Maintenance  Topic Date Due   INFLUENZA VACCINE  11/30/2022   COVID-19 Vaccine (7 - 2023-24 season) 12/31/2022   Medicare Annual Wellness (AWV)  01/08/2024   DTaP/Tdap/Td (3 - Td or Tdap) 05/03/2025   Pneumonia Vaccine 66+ Years old  Completed   DEXA SCAN  Completed   Zoster Vaccines- Shingrix  Completed   HPV VACCINES  Aged Out    Physical Exam: Vitals:   02/28/23 1129  BP: 122/78  Pulse: 62  Resp: 17  Temp: 97.6 F (36.4 C)  TempSrc: Temporal  SpO2: 92%  Weight: 160 lb (72.6 kg)  Height: 5' 2.25" (1.581 m)   Body mass index is 29.03 kg/m. Physical Exam Vitals reviewed.  Constitutional:      Appearance: Normal appearance.  HENT:     Head: Normocephalic.     Nose: Nose normal.     Mouth/Throat:     Mouth: Mucous membranes are moist.     Pharynx: Oropharynx is clear.  Eyes:     Pupils: Pupils are equal, round, and reactive to light.  Cardiovascular:     Rate and Rhythm: Normal rate and regular rhythm.     Pulses: Normal pulses.     Heart sounds: Normal heart sounds. No murmur heard. Pulmonary:     Effort: Pulmonary effort is normal.     Breath sounds: Normal breath  sounds.  Abdominal:     General: Abdomen is flat. Bowel sounds are normal.     Palpations: Abdomen is soft.  Musculoskeletal:        General: No swelling.     Cervical back: Neck supple.  Skin:    General: Skin is warm.  Neurological:     General: No focal deficit present.     Mental Status: She is alert.  Psychiatric:        Mood and Affect: Mood normal.        Thought Content: Thought content normal.     Labs reviewed: Basic  Metabolic Panel: Recent Labs    04/20/22 0820 08/10/22 0753 02/22/23 0820  NA 140 139 140  K 4.5 4.3 4.5  CL 107 105 106  CO2 24 28 24   GLUCOSE 83 92 89  BUN 23 15 19   CREATININE 0.86 0.87 0.93  CALCIUM 10.0 10.4 9.8  TSH 6.40* 6.09* 6.15*   Liver Function Tests: Recent Labs    04/20/22 0820 08/10/22 0753 02/22/23 0820  AST 15 15 14   ALT 12 13 13   BILITOT 0.3 0.5 0.4  PROT 6.6 6.6 6.6   No results for input(s): "LIPASE", "AMYLASE" in the last 8760 hours. No results for input(s): "AMMONIA" in the last 8760 hours. CBC: Recent Labs    04/20/22 0820 08/10/22 0753 02/22/23 0820  WBC 8.7 7.9 9.3  NEUTROABS 4,568 4,519 5,803  HGB 12.5 13.3 13.3  HCT 37.7 39.8 41.1  MCV 91.7 92.1 93.0  PLT 288 301 312   Lipid Panel: Recent Labs    04/20/22 0820 08/10/22 0753 02/22/23 0820  CHOL 189 282* 203*  HDL 57 56 51  LDLCALC 110* 195* 122*  TRIG 113 149 180*  CHOLHDL 3.3 5.0* 4.0   No results found for: "HGBA1C"  Procedures since last visit: No results found.  Assessment/Plan 1. Dizziness with Prolong Standing Most likely due to Orthostatics Encourage PO fluids Also Do compression stockings  2. Orthostatic hypotension Initially her SBP dropped to 80  Her BP did come up 110 after I raised her legs and we gave her some water No Chest pain or any other symptoms D/w Daughter  We will try PO hydration and Compression stockings Can consider low dose of Midodrine if symptoms persists See her in few weeks Carry Cane when walking  in the facility 3. Depression, recurrent (HCC) stable  4. Elevated TSH monitor  5. Cognitive impairment Worsening Recent MMSE 21/30 ? AL next time But does well with her daughters help  6. Planter Fascitis Uses Inserts  7. H/O alcohol abuse No recent Alcohol   8. Insomnia, unspecified type Neurontin helps 9 HLD LDL is much better Will continue same dose of Crestor for now 10Right chronic serous otitis media  Nasonex   Labs/tests ordered:   Next appt:  Visit date not found

## 2023-03-07 DIAGNOSIS — H04123 Dry eye syndrome of bilateral lacrimal glands: Secondary | ICD-10-CM | POA: Diagnosis not present

## 2023-03-07 DIAGNOSIS — H40053 Ocular hypertension, bilateral: Secondary | ICD-10-CM | POA: Diagnosis not present

## 2023-04-29 ENCOUNTER — Other Ambulatory Visit: Payer: Self-pay | Admitting: Internal Medicine

## 2023-05-30 ENCOUNTER — Non-Acute Institutional Stay: Payer: Medicare Other | Admitting: Internal Medicine

## 2023-05-30 ENCOUNTER — Encounter: Payer: Self-pay | Admitting: Internal Medicine

## 2023-05-30 VITALS — BP 138/80 | HR 68 | Temp 98.7°F | Ht 62.0 in

## 2023-05-30 DIAGNOSIS — F339 Major depressive disorder, recurrent, unspecified: Secondary | ICD-10-CM

## 2023-05-30 DIAGNOSIS — R4189 Other symptoms and signs involving cognitive functions and awareness: Secondary | ICD-10-CM | POA: Diagnosis not present

## 2023-05-30 DIAGNOSIS — E785 Hyperlipidemia, unspecified: Secondary | ICD-10-CM | POA: Diagnosis not present

## 2023-05-30 DIAGNOSIS — I499 Cardiac arrhythmia, unspecified: Secondary | ICD-10-CM | POA: Diagnosis not present

## 2023-05-30 DIAGNOSIS — R42 Dizziness and giddiness: Secondary | ICD-10-CM

## 2023-05-30 DIAGNOSIS — G4700 Insomnia, unspecified: Secondary | ICD-10-CM | POA: Diagnosis not present

## 2023-05-30 DIAGNOSIS — M720 Palmar fascial fibromatosis [Dupuytren]: Secondary | ICD-10-CM

## 2023-05-30 DIAGNOSIS — R7989 Other specified abnormal findings of blood chemistry: Secondary | ICD-10-CM

## 2023-05-30 NOTE — Progress Notes (Unsigned)
[facility]:  [facility] Service:  Clinic (12)  Provider:   Code Status: DNR Goals of Care:     05/30/2023    1:03 PM  Advanced Directives  Does Patient Have a Medical Advance Directive? Yes  Type of Estate agent of [place];Living will;Out of facility DNR (pink MOST or yellow form)  Does patient want to make changes to medical advance directive? No - Patient declined     Chief Complaint  Patient presents with   Medical Management of Chronic Issues    Follow up. Discuss the need for covid.    HPI: Patient is a 89 y.o. female seen today for medical management of chronic diseases.   [place]ives in [place] Daughter here with her   Patient has history of hyper lipidemia, arthritis status post right hip arthroplasty,  essential tremors, anxiety and constipation H/o Alcohol Abuse. Quit since 12/20 Also Bilateral Shoulder Pain Right ear chronic pain Plantar Fascitis  Discussed the use of AI scribe software for clinical note transcription with the patient, who gave verbal consent to proceed.  History of Present Illness   The patient is a 89 year old female who presents with episodes of dizziness and lightheadedness.  She has been experiencing episodes of dizziness and lightheadedness, particularly when feeling anxious or excited. These episodes occur intermittently and are described as a sensation of 'strange lightness to head.' They do not progress to syncope, and she has not experienced any falls. During these episodes, she typically sits quietly and rests until the symptoms subside.  She manages her symptoms by wearing tight stockings, although not daily, and acknowledges that insufficient hydration might contribute to her symptoms. She did Have episode of Low BP in the office on Last visit It got resolved when she sat down and we raised her Legs  She is currently taking gabapentin at night, which has significantly improved her sleep.    She  takes a statin three days a week.  Her physical activity includes walking around her building and participating in [place] [employer] classes twice a week. She has reduced her long walks due to foot issues but continues Tai Chi, which she finds beneficial for her balance and overall well-being.  She has a history of ear issues, including pressure and wax buildup, but reports that her ears are currently fine. She has not experienced significant ear pain recently.      Is able to maintain her ADLs  Her daughter is involved.  She does not drive anymore.  Daughter takes care of everything else. Past Medical History:  Diagnosis Date   Allergic rhinitis due to pollen    Anxiety state, unspecified    Arthritis    RIGHT LEG, HANDS.   Cancer (HCC)    FACE/NOSE -SKIN-squamous cell and basal cell.   Disorder of bone and cartilage, unspecified    Lumbago    Lyme borreliosis    Other atopic dermatitis and related conditions    Other dysphagia    Reflux esophagitis    Senile osteoporosis    TMJ dysfunction    MORE ON LEFT "WEARS BITE PROTECTION AT NIGHT"    Past Surgical History:  Procedure Laterality Date   APPENDECTOMY  1950   CATARACT EXTRACTION, BILATERAL Bilateral    EYE SURGERY Right    right eye ptrigium excision x2   HERNIA REPAIR Bilateral    TOTAL HIP ARTHROPLASTY Right 12/10/2015   Procedure: RIGHT TOTAL HIP ARTHROPLASTY ANTERIOR APPROACH;  Surgeon: Kathryne Hitch, MD;  Location: [facility];  Service: Orthopedics;  Laterality: Right;  Spinal to General    Allergies  Allergen Reactions   Brandy [Alcohol] Anaphylaxis    Related to grapes   Prune Anaphylaxis    PLUMS,GRAPES,RAISINS   Aspirin Other (See Comments)    Stomach bleed   Augmentin [Amoxicillin-Pot Clavulanate] Hives and Itching   Clindamycin Other (See Comments)    unkown   Omnicef [Cefdinir] Hives   Vinegar [Acetic Acid]     basalmic   Alprazolam Rash   Ceftin [Cefuroxime Axetil] Rash   Doxycycline Rash   Eryc  [Erythromycin] Rash   Penicillins Rash    Outpatient Encounter Medications as of 05/30/2023  Medication Sig   acetaminophen (TYLENOL) 325 MG tablet Take 650 mg by mouth every 6 (six) hours as needed for mild pain.   cholecalciferol (VITAMIN D) 1000 UNITS tablet Take 1,000 Units by mouth daily.   dorzolamide-timolol (COSOPT) 2-0.5 % ophthalmic solution 1 drop 2 (two) times daily.   EPINEPHrine (EPIPEN 2-PAK) 0.3 mg/0.3 mL IJ SOAJ injection Inject 0.3 mg into the muscle as needed for anaphylaxis.   fluticasone (FLONASE) 50 MCG/ACT nasal spray Place 2 sprays into both nostrils daily.   gabapentin (NEURONTIN) 100 MG capsule Take 1 capsule (100 mg total) by mouth at bedtime.   GARLIC PO Take 1 tablet by mouth daily.   latanoprost (XALATAN) 0.005 % ophthalmic solution Place 1 drop into both eyes at bedtime.   rosuvastatin (CRESTOR) 5 MG tablet TAKE 1 TABLET BY MOUTH EVERY DAY   senna (SENOKOT) 8.6 MG TABS tablet Take 1 tablet by mouth daily as needed for mild constipation.   No facility-administered encounter medications on file as of 05/30/2023.    Review of Systems:  Review of Systems  Constitutional:  Negative for activity change and appetite change.  HENT: Negative.    Respiratory:  Negative for cough and shortness of breath.   Cardiovascular:  Negative for leg swelling.  Gastrointestinal:  Negative for constipation.  Genitourinary: Negative.   Musculoskeletal:  Negative for arthralgias, gait problem and myalgias.  Skin: Negative.   Neurological:  Positive for dizziness. Negative for weakness.  Psychiatric/Behavioral:  Positive for confusion. Negative for dysphoric mood and sleep disturbance.     Health Maintenance  Topic Date Due   COVID-19 Vaccine (7 - 2024-25 season) 12/31/2022   Medicare Annual Wellness (AWV)  01/08/2024   DTaP/Tdap/Td (3 - Td or Tdap) 05/03/2025   Pneumonia Vaccine 39+ Years old  Completed   INFLUENZA VACCINE  Completed   DEXA SCAN  Completed   Zoster  Vaccines- Shingrix  Completed   HPV VACCINES  Aged Out    Physical Exam: Vitals:   05/30/23 1124  BP: 138/80  Pulse: 68  Temp: 98.7 F (37.1 C)  SpO2: 93%  Height: 5\' 2"  (1.575 m)   Body mass index is 29.26 kg/m. Physical Exam Vitals reviewed.  Constitutional:      Appearance: Normal appearance.  HENT:     Head: Normocephalic.     Right Ear: Tympanic membrane normal.     Left Ear: Tympanic membrane normal.     Ears:     Comments: Small wax in both ears    Nose: Nose normal.     Mouth/Throat:     Mouth: Mucous membranes are moist.     Pharynx: Oropharynx is clear.  Eyes:     Pupils: Pupils are equal, round, and reactive to light.  Cardiovascular:  Rate and Rhythm: Normal rate. Rhythm irregular.     Pulses: Normal pulses.     Heart sounds: Normal heart sounds. No murmur heard. Pulmonary:     Effort: Pulmonary effort is normal.     Breath sounds: Normal breath sounds.  Abdominal:     General: Abdomen is flat. Bowel sounds are normal.     Palpations: Abdomen is soft.  Musculoskeletal:        General: No swelling.     Cervical back: Neck supple.  Skin:    General: Skin is warm.  Neurological:     General: No focal deficit present.     Mental Status: She is alert and oriented to person, place, and time.  Psychiatric:        Mood and Affect: Mood normal.        Thought Content: Thought content normal.     Labs reviewed: Basic Metabolic Panel: Recent Labs    08/10/22 0753 02/22/23 0820  NA 139 140  K 4.3 4.5  CL 105 106  CO2 28 24  GLUCOSE 92 89  BUN 15 19  CREATININE 0.87 0.93  CALCIUM 10.4 9.8  TSH 6.09* 6.15*   Liver Function Tests: Recent Labs    08/10/22 0753 02/22/23 0820  AST 15 14  ALT 13 13  BILITOT 0.5 0.4  PROT 6.6 6.6   No results for input(s): "LIPASE", "AMYLASE" in the last 8760 hours. No results for input(s): "AMMONIA" in the last 8760 hours. CBC: Recent Labs    08/10/22 0753 02/22/23 0820  WBC 7.9 9.3  NEUTROABS  4,519 5,803  HGB 13.3 13.3  HCT 39.8 41.1  MCV 92.1 93.0  PLT 301 312   Lipid Panel: Recent Labs    08/10/22 0753 02/22/23 0820  CHOL 282* 203*  HDL 56 51  LDLCALC 195* 122*  TRIG 149 180*  CHOLHDL 5.0* 4.0   No results found for: "HGBA1C"  Procedures since last visit: No results found.  Assessment/Plan 1. Irregular heart rhythm (Primary)/Sinus Rhythm with PVCS EKG showed Sinus rhythm with PVC Will Check CMP TSH and Mag level She was not symptomatic today Discussed potential use of beta blockers, but decided against due to current blood pressure levels. 2. Dizziness D/W the daughter Episodes of dizziness associated with anxiety and excitement. No falls or fainting. Discussed potential cardiac workup including temporary monitor and possible Cardiac Monitor but decided to monitor symptoms for now. -Continue compression stockings as needed, especially during prolonged standing or walking. -Increase water intake, particularly during the day.  IF labs are normal will consider Cardiology Referral  3. Depression, recurrent (HCC) Doing well with her Mood  4. Elevated TSH Repeat Labs  5. Cognitive impairment Doing well in [place] with help of her [place]  30. Planter Fascitis Sees Podiatrist and has inserts  7. Insomnia, unspecified type Neurontin  8. Hyperlipidemia with target LDL less than 100 Low dose of Crestor    8 Bone Health Consider Bone density Order is already there -Continue current exercise regimen, including Tai Chi twice weekly. 9 Chronic Ear pain  Controlled with Flonase PRN          Labs/tests ordered:  * No order type specified * Next appt:  Visit date not found

## 2023-05-31 ENCOUNTER — Other Ambulatory Visit: Payer: Medicare Other

## 2023-05-31 DIAGNOSIS — I499 Cardiac arrhythmia, unspecified: Secondary | ICD-10-CM | POA: Diagnosis not present

## 2023-05-31 DIAGNOSIS — R42 Dizziness and giddiness: Secondary | ICD-10-CM | POA: Diagnosis not present

## 2023-05-31 DIAGNOSIS — R7989 Other specified abnormal findings of blood chemistry: Secondary | ICD-10-CM | POA: Diagnosis not present

## 2023-05-31 LAB — CBC WITH DIFFERENTIAL/PLATELET
Absolute Lymphocytes: 2475 {cells}/uL (ref 850–3900)
Absolute Monocytes: 801 {cells}/uL (ref 200–950)
Basophils Absolute: 72 {cells}/uL (ref 0–200)
Basophils Relative: 0.8 %
Eosinophils Absolute: 333 {cells}/uL (ref 15–500)
Eosinophils Relative: 3.7 %
HCT: 38.9 % (ref 35.0–45.0)
Hemoglobin: 12.9 g/dL (ref 11.7–15.5)
MCH: 30.5 pg (ref 27.0–33.0)
MCHC: 33.2 g/dL (ref 32.0–36.0)
MCV: 92 fL (ref 80.0–100.0)
MPV: 11.7 fL (ref 7.5–12.5)
Monocytes Relative: 8.9 %
Neutro Abs: 5319 {cells}/uL (ref 1500–7800)
Neutrophils Relative %: 59.1 %
Platelets: 286 10*3/uL (ref 140–400)
RBC: 4.23 10*6/uL (ref 3.80–5.10)
RDW: 13 % (ref 11.0–15.0)
Total Lymphocyte: 27.5 %
WBC: 9 10*3/uL (ref 3.8–10.8)

## 2023-05-31 LAB — COMPLETE METABOLIC PANEL WITH GFR
AG Ratio: 1.8 (calc) (ref 1.0–2.5)
ALT: 13 U/L (ref 6–29)
AST: 15 U/L (ref 10–35)
Albumin: 4.1 g/dL (ref 3.6–5.1)
Alkaline phosphatase (APISO): 84 U/L (ref 37–153)
BUN: 20 mg/dL (ref 7–25)
CO2: 25 mmol/L (ref 20–32)
Calcium: 9.6 mg/dL (ref 8.6–10.4)
Chloride: 108 mmol/L (ref 98–110)
Creat: 0.87 mg/dL (ref 0.60–0.95)
Globulin: 2.3 g/dL (ref 1.9–3.7)
Glucose, Bld: 87 mg/dL (ref 65–99)
Potassium: 4.6 mmol/L (ref 3.5–5.3)
Sodium: 139 mmol/L (ref 135–146)
Total Bilirubin: 0.4 mg/dL (ref 0.2–1.2)
Total Protein: 6.4 g/dL (ref 6.1–8.1)
eGFR: 63 mL/min/{1.73_m2} (ref >=60)

## 2023-05-31 LAB — TSH: TSH: 4.3 m[IU]/L (ref 0.40–4.50)

## 2023-05-31 LAB — T4, FREE: Free T4: 1 ng/dL (ref 0.8–1.8)

## 2023-05-31 LAB — MAGNESIUM: Magnesium: 2.3 mg/dL (ref 1.5–2.5)

## 2023-08-01 ENCOUNTER — Encounter: Payer: Self-pay | Admitting: Internal Medicine

## 2023-08-01 ENCOUNTER — Non-Acute Institutional Stay: Payer: Commercial Indemnity | Admitting: Internal Medicine

## 2023-08-01 VITALS — BP 110/60 | HR 76 | Temp 97.0°F | Resp 16 | Ht 62.0 in | Wt 162.6 lb

## 2023-08-01 DIAGNOSIS — I499 Cardiac arrhythmia, unspecified: Secondary | ICD-10-CM

## 2023-08-01 DIAGNOSIS — R42 Dizziness and giddiness: Secondary | ICD-10-CM | POA: Diagnosis not present

## 2023-08-01 DIAGNOSIS — R0789 Other chest pain: Secondary | ICD-10-CM | POA: Diagnosis not present

## 2023-08-01 NOTE — Progress Notes (Unsigned)
 Location[facility]  Cl[facility] (12)  Provider:   Code Status: *** Goals of Care:     08/01/2023    1:28 PM  Advanced Directives  Does Patient Have a Medical Advance Directive? Yes  Type of Estate agent of [place];Living will;Out of facility DNR (pink MOST or yellow form)  Does patient want to make changes to medical advance directive? No - Patient declined  Copy of Healthcare Power of Attorney in Chart? Yes - validated most recent copy scanned in chart (See row information)     Chief Complaint  Patient presents with  . Medical Management of Chronic Issues    2 month follow up. Discuss the need for Covid Booster.     HPI: Patient is a 89 y.o. female seen today for an acute visit for  Past Medical History:  Diagnosis Date  . Allergic rhinitis due to pollen   . Anxiety state, unspecified   . Arthritis    RIGHT LEG, HANDS.  [name] Cancer (HCC)    FACE/NOSE -SKIN-squamous cell and basal cell.  . Disorder of bone and cartilage, unspecified   . Lumbago   . Lyme borreliosis   . Other atopic dermatitis and related conditions   . Other dysphagia   . Reflux esophagitis   . Senile osteoporosis   . TMJ dysfunction    MORE ON LEFT "WEARS BITE PROTECTION AT NIGHT"    Past Surgical History:  Procedure Laterality Date  . APPENDECTOMY  1950  . CATARACT EXTRACTION, BILATERAL Bilateral   . EYE SURGERY Right    right eye ptrigium excision x2  . HERNIA REPAIR Bilateral   . TOTAL HIP ARTHROPLASTY Right 12/10/2015   Procedure: RIGHT TOTAL HIP ARTHROPLASTY ANTERIOR APPROACH;  Surgeon: Kathryne Hitch, MD;  Location: [facility];  Service: Orthopedics;  Laterality: Right;  Spinal to General    Allergies  Allergen Reactions  . Brandy [Alcohol] Anaphylaxis    Related to grapes  . Prune Anaphylaxis    PLUMS,GRAPES,RAISINS  . Aspirin Other (See Comments)    Stomach bleed  . Augmentin [Amoxicillin-Pot Clavulanate] Hives and Itching  .  Clindamycin Other (See Comments)    unkown  . Omnicef [Cefdinir] Hives  . Vinegar [Acetic Acid]     basalmic  . Alprazolam Rash  . Ceftin [Cefuroxime Axetil] Rash  . Doxycycline Rash  . Eryc [Erythromycin] Rash  . Penicillins Rash    Outpatient Encounter Medications as of 08/01/2023  Medication Sig  . acetaminophen (TYLENOL) 325 MG tablet Take 650 mg by mouth every 6 (six) hours as needed for mild pain.  . cholecalciferol (VITAMIN D) 1000 UNITS tablet Take 1,000 Units by mouth daily.  . dorzolamide-timolol (COSOPT) 2-0.5 % ophthalmic solution 1 drop 2 (two) times daily.  [name] EPINEPHrine (EPIPEN 2-PAK) 0.3 mg/0.3 mL IJ SOAJ injection Inject 0.3 mg into the muscle as needed for anaphylaxis.  . fluticasone (FLONASE) 50 MCG/ACT nasal spray Place 2 sprays into both nostrils daily.  [name] gabapentin (NEURONTIN) 100 MG capsule Take 1 capsule (100 mg total) by mouth at bedtime.  [name] GARLIC PO Take 1 tablet by mouth daily.  [name] latanoprost (XALATAN) 0.005 % ophthalmic solution Place 1 drop into both eyes at bedtime.  . rosuvastatin (CRESTOR) 5 MG tablet TAKE 1 TABLET BY MOUTH EVERY DAY  . senna (SENOKOT) 8.6 MG TABS tablet Take 1 tablet by mouth daily as needed for mild constipation.   No facility-administered encounter medications on file as  of 08/01/2023.    Review of Systems:  Review of Systems  Health Maintenance  Topic Date Due  . COVID-19 Vaccine (7 - 2024-25 season) 12/31/2022  . INFLUENZA VACCINE  11/30/2023  . Medicare Annual Wellness (AWV)  01/08/2024  . DTaP/Tdap/Td (3 - Td or Tdap) 05/03/2025  . Pneumonia Vaccine 51+ Years old  Completed  . DEXA SCAN  Completed  . Zoster Vaccines- Shingrix  Completed  . HPV VACCINES  Aged Out    Physical Exam: Vitals:   08/01/23 1321  BP: 110/60  Pulse: 76  Resp: 16  Temp: (!) 97 F (36.1 C)  SpO2: 96%  Weight: 162 lb 9.6 oz (73.8 kg)  Height: 5\' 2"  (1.575 m)   Body mass index is 29.74 kg/m. Physical Exam  Labs reviewed: Basic  Metabolic Panel: Recent Labs    08/10/22 0753 02/22/23 0820 05/31/23 0810  NA 139 140 139  K 4.3 4.5 4.6  CL 105 106 108  CO2 28 24 25   GLUCOSE 92 89 87  BUN 15 19 20   CREATININE 0.87 0.93 0.87  CALCIUM 10.4 9.8 9.6  MG  --   --  2.3  TSH 6.09* 6.15* 4.30   Liver Function Tests: Recent Labs    08/10/22 0753 02/22/23 0820 05/31/23 0810  AST 15 14 15   ALT 13 13 13   BILITOT 0.5 0.4 0.4  PROT 6.6 6.6 6.4   No results for input(s): "LIPASE", "AMYLASE" in the last 8760 hours. No results for input(s): "AMMONIA" in the last 8760 hours. CBC: Recent Labs    08/10/22 0753 02/22/23 0820 05/31/23 0810  WBC 7.9 9.3 9.0  NEUTROABS 4,519 5,803 5,319  HGB 13.3 13.3 12.9  HCT 39.8 41.1 38.9  MCV 92.1 93.0 92.0  PLT 301 312 286   Lipid Panel: Recent Labs    08/10/22 0753 02/22/23 0820  CHOL 282* 203*  HDL 56 51  LDLCALC 195* 122*  TRIG 149 180*  CHOLHDL 5.0* 4.0   No results found for: "HGBA1C"  Procedures since last visit: No results found.  Assessment/Plan There are no diagnoses linked to this encounter.   Labs/tests ordered:  * No order type specified * Next appt:  Visit date not found

## 2023-08-14 ENCOUNTER — Encounter: Payer: Self-pay | Admitting: Internal Medicine

## 2023-11-04 ENCOUNTER — Other Ambulatory Visit: Payer: Self-pay | Admitting: Internal Medicine

## 2023-11-06 DIAGNOSIS — R41841 Cognitive communication deficit: Secondary | ICD-10-CM | POA: Diagnosis not present

## 2023-11-08 DIAGNOSIS — R41841 Cognitive communication deficit: Secondary | ICD-10-CM | POA: Diagnosis not present

## 2023-11-12 DIAGNOSIS — R41841 Cognitive communication deficit: Secondary | ICD-10-CM | POA: Diagnosis not present

## 2023-11-13 ENCOUNTER — Ambulatory Visit: Attending: Cardiology | Admitting: Internal Medicine

## 2023-11-13 ENCOUNTER — Ambulatory Visit: Attending: Internal Medicine

## 2023-11-13 VITALS — BP 135/70 | HR 54 | Ht 63.0 in | Wt 156.6 lb

## 2023-11-13 DIAGNOSIS — R42 Dizziness and giddiness: Secondary | ICD-10-CM

## 2023-11-13 DIAGNOSIS — R0609 Other forms of dyspnea: Secondary | ICD-10-CM | POA: Diagnosis not present

## 2023-11-13 DIAGNOSIS — R0789 Other chest pain: Secondary | ICD-10-CM | POA: Diagnosis present

## 2023-11-13 NOTE — Progress Notes (Signed)
 Cardiology Office Note:  .    Date:  11/13/2023  ID:  Rachel Morris, DOB 10-Jun-1930, MRN [id number] PCP: Charlanne Fredia CROME, MD  [facility] Providers Cardiologist:  None     CC: Lightheadedness Consulted for the evaluation of irregular heart beat at the behest of Dr. Charlanne   History of Present Illness: .    Rachel Morris is a 89 y.o. female with a history of atypical chest pain and irregular heartbeats who presents with shortness of breath and lightheadedness. She is accompanied by Rachel Morris, her daughter. She was referred by Dr. Charlanne for evaluation of dizziness and irregular heartbeat.  She experiences shortness of breath and lightheadedness, particularly in overwhelming or new situations. She describes needing to 'relax everything to get a feeling of breath coming in and out nicely.' A recent visit noted a drop in blood pressure.  She has a history of atypical chest pain, described as a 'single pain' across her chest, but has not experienced this discomfort in the past two weeks.  She has a history of irregular heartbeats but is not currently experiencing palpitations or 'funny heartbeats.' Previous EKGs have not shown irregularities, and she does not recall symptoms associated with PVCs or atrial fibrillation.  Her past medical history includes hyperlipidemia and cognitive impairment. She has been alcohol-free since 2020 and engages in activities like tai chi.  Discussed the use of AI scribe software for clinical note transcription with the patient, who gave verbal consent to proceed.   Relevant histories: .  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   Studies Reviewed: .     Cardiac Studies & Procedures   ______________________________________________________________________________________________     ECHOCARDIOGRAM  ECHOCARDIOGRAM COMPLETE 04/07/2019  Narrative ECHOCARDIOGRAM  REPORT    Patient Name:   Rachel Morris Date of Exam: 04/07/2019 Medical Rec #:  [id number]         Height:       62.0 in Accession #:    [id number]        Weight:       136.9 lb Date of Birth:  18-May-1930         BSA:          1.63 m Patient Age:    88 years          BP:           152/71 mmHg Patient Gender: F                 HR:           87 bpm. Exam Location:  Inpatient  Procedure: 2D Echo  Indications:    Chest pain 786.50/R07.9  History:        Patient has no prior history of Echocardiogram examinations. Risk Factors:Dyslipidemia.  Sonographer:    Rome Eans RDCS (AE) Referring Phys: [id number] [facility]  IMPRESSIONS   1. Left ventricular ejection fraction, by visual estimation, is 60 to 65%. The left ventricle has normal function. There is no left ventricular hypertrophy. 2. Left ventricular diastolic parameters are indeterminate. 3. The left ventricle has no regional wall motion abnormalities. 4. Global right ventricle has normal systolic function.The right ventricular size is normal. No increase in right ventricular wall thickness. 5. Left atrial size was normal. 6. Right atrial size was normal. 7. The mitral valve is grossly normal. Trace mitral valve regurgitation. 8. The tricuspid valve  is normal in structure. Tricuspid valve regurgitation is trivial. 9. The aortic valve is tricuspid. Aortic valve regurgitation is not visualized. Mild aortic valve sclerosis without stenosis. 10. The pulmonic valve was not well visualized. Pulmonic valve regurgitation is not visualized. 11. The inferior vena cava is normal in size with greater than 50% respiratory variability, suggesting right atrial pressure of 3 mmHg.  FINDINGS Left Ventricle: Left ventricular ejection fraction, by visual estimation, is 60 to 65%. The left ventricle has normal function. The left ventricle has no regional wall motion abnormalities. There is no left ventricular hypertrophy. Left  ventricular diastolic parameters are indeterminate.  Right Ventricle: The right ventricular size is normal. No increase in right ventricular wall thickness. Global RV systolic function is has normal systolic function.  Left Atrium: Left atrial size was normal in size.  Right Atrium: Right atrial size was normal in size  Pericardium: There is no evidence of pericardial effusion.  Mitral Valve: The mitral valve is grossly normal. MV peak gradient, 6.2 mmHg. Trace mitral valve regurgitation.  Tricuspid Valve: The tricuspid valve is normal in structure. Tricuspid valve regurgitation is trivial.  Aortic Valve: The aortic valve is tricuspid. . There is mild thickening and mild calcification of the aortic valve. Aortic valve regurgitation is not visualized. Mild aortic valve sclerosis is present, with no evidence of aortic valve stenosis. There is mild thickening of the aortic valve. There is mild calcification of the aortic valve. Aortic valve mean gradient measures 3.0 mmHg. Aortic valve peak gradient measures 6.2 mmHg. Aortic valve area, by VTI measures 2.22 cm.  Pulmonic Valve: The pulmonic valve was not well visualized. Pulmonic valve regurgitation is not visualized.  Aorta: The aortic root, ascending aorta and aortic arch are all structurally normal, with no evidence of dilitation or obstruction.  Pulmonary Artery: The pulmonary artery is not well seen.  Venous: The inferior vena cava is normal in size with greater than 50% respiratory variability, suggesting right atrial pressure of 3 mmHg.  IAS/Shunts: No atrial level shunt detected by color flow Doppler.   LEFT VENTRICLE PLAX 2D LVIDd:         4.30 cm  Diastology LVIDs:         2.80 cm  LV e' lateral:   8.59 cm/s LV PW:         1.20 cm  LV E/e' lateral: 10.4 LV IVS:        1.20 cm  LV e' medial:    5.77 cm/s LVOT diam:     1.90 cm  LV E/e' medial:  15.5 LV SV:         54 ml LV SV Index:   32.22 LVOT Area:     2.84  cm   RIGHT VENTRICLE            IVC RV Basal diam:  2.20 cm    IVC diam: 1.60 cm RV S prime:     7.83 cm/s TAPSE (M-mode): 1.3 cm  LEFT ATRIUM             Index       RIGHT ATRIUM          Index LA diam:        3.20 cm 1.97 cm/m  RA Area:     9.20 cm LA Vol (A2C):   44.6 ml 27.41 ml/m RA Volume:   17.50 ml 10.75 ml/m LA Vol (A4C):   31.5 ml 19.36 ml/m LA Biplane Vol: 39.0 ml 23.97 ml/m AORTIC VALVE  AV Area (Vmax):    1.99 cm AV Area (Vmean):   1.98 cm AV Area (VTI):     2.22 cm AV Vmax:           125.00 cm/s AV Vmean:          84.900 cm/s AV VTI:            0.250 m AV Peak Grad:      6.2 mmHg AV Mean Grad:      3.0 mmHg LVOT Vmax:         87.70 cm/s LVOT Vmean:        59.200 cm/s LVOT VTI:          0.196 m LVOT/AV VTI ratio: 0.78  AORTA Ao Root diam: 2.70 cm  MITRAL VALVE MV Area (PHT): 3.99 cm              SHUNTS MV Peak grad:  6.2 mmHg              Systemic VTI:  0.20 m MV Mean grad:  2.0 mmHg              Systemic Diam: 1.90 cm MV Vmax:       1.24 m/s MV Vmean:      70.3 cm/s MV VTI:        0.25 m MV PHT:        55.10 msec MV Decel Time: 190 msec MV E velocity: 89.70 cm/s  103 cm/s MV A velocity: 107.00 cm/s 70.3 cm/s MV E/A ratio:  0.84        1.5   Shelda Bruckner MD Electronically signed by Shelda Bruckner MD Signature Date/Time: 04/07/2019/5:34:32 PM    Final          ______________________________________________________________________________________________      Physical Exam:    VS:  BP 135/70 (BP Location: Right Arm)   Pulse (!) 54   Ht 5' 3 (1.6 m)   Wt 156 lb 9.6 oz (71 kg)   SpO2 95%   BMI 27.74 kg/m    Wt Readings from Last 3 Encounters:  11/13/23 156 lb 9.6 oz (71 kg)  08/01/23 162 lb 9.6 oz (73.8 kg)  02/28/23 160 lb (72.6 kg)    Gen: no distress   Neck: No JVD Cardiac: No Rubs or Gallops, systolic murmur, regular bradycardia, +2 radial pulses Respiratory: Clear to auscultation bilaterally, normal  effort, normal  respiratory rate GI: Soft, nontender, non-distended  MS: No  edema;  moves all extremities Integument: Skin feels warm Neuro:  At time of evaluation, alert and oriented to person/place/time/situation  Psych: Normal affect, patient feels ok   ASSESSMENT AND PLAN: .     An EKG was ordered for irregular rhythm and shows sinus bradycardia  Lightheadedness and dyspnea on exertion Lightheadedness and dyspnea on exertion, particularly in overwhelming or new situations, with potential blood pressure fluctuations as a cause. Differential includes non-cardiac causes, AS or cardiac arrhythmias. Recent EKG showed no significant findings. Conservative management emphasized to improve quality of life. - Order heart monitor for two weeks to assess for arrhythmias - Schedule echocardiogram to evaluate cardiac function and rule out significant aortic stenosis  Irregular heart rhythm Irregular heart rhythm noted by primary care provider, with no irregularities detected on recent EKG. Differential includes atrial fibrillation, PVCs, or heart block. Atrial fibrillation would necessitate anticoagulation to reduce stroke risk. Significant heart block may require pacemaker consideration. Conservative approach favored unless significant findings are noted. - Order heart  monitor for two weeks to assess for arrhythmias  Aortic sclerosis without stenosis Aortic sclerosis without stenosis noted on echocardiogram from 2020, with heart murmur likely due to age-related calcification. Echocardiogram needed to assess current valve status and rule out progression to aortic stenosis. Potential for transcatheter aortic valve replacement if significant stenosis is found, given her functional status. - Schedule echocardiogram to evaluate valve function  Atypical chest pain Intermittent atypical chest pain reported, but not recently. No significant symptoms suggestive of coronary artery disease. Previous stress  test in 2014 was unremarkable. Current symptoms do not warrant further stress testing unless significant chest discomfort develops.  Hyperlipidemia - 89 years old; no aggressive therapy started  APP f/u unless high risk results then yearly or PRN f/u based on patient preference  Stanly Leavens, MD FASE [facility] Cardiologist [facility]  [address] [place], [address] [place], [place] [address] [phone]  1:21 PM

## 2023-11-13 NOTE — Patient Instructions (Signed)
 Medication Instructions:  Your physician recommends that you continue on your current medications as directed. Please refer to the Current Medication list given to you today.  *If you need a refill on your cardiac medications before your next appointment, please call your pharmacy*  Lab Work: NONE  If you have labs (blood work) drawn today and your tests are completely normal, you will receive your results only by: MyChart Message (if you have MyChart) OR A paper copy in the mail If you have any lab test that is abnormal or we need to change your treatment, we will call you to review the results.  Testing/Procedures: Your physician has requested that you have an echocardiogram. Echocardiography is a painless test that uses sound waves to create images of your heart. It provides your doctor with information about the size and shape of your heart and how well your heart's chambers and valves are working. This procedure takes approximately one hour. There are no restrictions for this procedure. Please do NOT wear cologne, perfume, aftershave, or lotions (deodorant is allowed). Please arrive 15 minutes prior to your appointment time.  Please note: We ask at that you not bring children with you during ultrasound (echo/ vascular) testing. Due to room size and safety concerns, children are not allowed in the ultrasound rooms during exams. Our front office staff cannot provide observation of children in our lobby area while testing is being conducted. An adult accompanying a patient to their appointment will only be allowed in the ultrasound room at the discretion of the ultrasound technician under special circumstances. We apologize for any inconvenience.  Your physician has requested that you wear a heart monitor.   Follow-Up: At [facility], you and your health needs are our priority.  As part of our continuing mission to provide you with exceptional heart care, our providers are all part  of one team.  This team includes your primary Cardiologist (physician) and Advanced Practice Providers or APPs (Physician Assistants and Nurse Practitioners) who all work together to provide you with the care you need, when you need it.  Your next appointment:   3-4 month(s)  Provider:   One of our Advanced Practice Providers (APPs): Morse Clause, PA-C  Lamarr Satterfield, NP Miriam Shams, NP  Olivia Pavy, PA-C Josefa Beauvais, NP  Leontine Salen, PA-C Orren Fabry, PA-C  [place], PA-C Ernest Dick, NP  Damien Braver, NP Jon Hails, PA-C  Waddell Donath, PA-C    Dayna Dunn, PA-C  Glendia Ferrier, PA-C Lum Louis, NP Katlyn West, NP Callie Goodrich, PA-C  Evan Williams, PA-C Sheng Haley, PA-C  Xika Zhao, NP Kathleen Johnson, PA-C     Other Instructions GEOFFRY HEWS- Long Term Monitor Instructions  Your physician has requested you wear a ZIO patch monitor for 14 days.  This is a single patch monitor. [employer] supplies one patch monitor per enrollment. Additional stickers are not available. Please do not apply patch if you will be having a Nuclear Stress Test,   Cardiac CT, MRI, or Chest Xray during the period you would be wearing the  monitor. The patch cannot be worn during these tests. You cannot remove and re-apply the  ZIO XT patch monitor.  Your ZIO patch monitor will be mailed 3 day USPS to your address on file. It may take 3-5 days  to receive your monitor after you have been enrolled.  Once you have received your monitor, please review the enclosed instructions. Your monitor  has already been registered assigning  a specific monitor serial # to you.  Billing and Patient Assistance Program Information  We have supplied [employer] with any of your insurance information on file for billing purposes. [employer] offers a sliding scale Patient Assistance Program for patients that do not have  insurance, or whose insurance does not completely cover the cost of the ZIO monitor.  You must  apply for the Patient Assistance Program to qualify for this discounted rate.  To apply, please call [employer] at [phone], select option 4, select option 2, ask to apply for  Patient Assistance Program. Meredeth will ask your household income, and how many people  are in your household. They will quote your out-of-pocket cost based on that information.  [employer] will also be able to set up a 49-month, interest-free payment plan if needed.  Applying the monitor   Shave hair from upper left chest.  Hold abrader disc by orange tab. Rub abrader in 40 strokes over the upper left chest as  indicated in your monitor instructions.  Clean area with 4 enclosed alcohol pads. Let dry.  Apply patch as indicated in monitor instructions. Patch will be placed under collarbone on left  side of chest with arrow pointing upward.  Rub patch adhesive wings for 2 minutes. Remove white label marked 1. Remove the white  label marked 2. Rub patch adhesive wings for 2 additional minutes.  While looking in a mirror, press and release button in center of patch. A small green light will  flash 3-4 times. This will be your only indicator that the monitor has been turned on.  Do not shower for the first 24 hours. You may shower after the first 24 hours.  Press the button if you feel a symptom. You will hear a small click. Record Date, Time and  Symptom in the Patient Logbook.  When you are ready to remove the patch, follow instructions on the last 2 pages of Patient  Logbook. Stick patch monitor onto the last page of Patient Logbook.  Place Patient Logbook in the blue and white box. Use locking tab on box and tape box closed  securely. The blue and white box has prepaid postage on it. Please place it in the mailbox as  soon as possible. Your physician should have your test results approximately 7 days after the  monitor has been mailed back to [facility].  Call [facility] Customer Care at [phone] if  you have questions regarding  your ZIO XT patch monitor. Call them immediately if you see an orange light blinking on your  monitor.  If your monitor falls off in less than 4 days, contact our Monitor department at [phone].  If your monitor becomes loose or falls off after 4 days call [employer] at [phone] for  suggestions on securing your monitor

## 2023-11-13 NOTE — Progress Notes (Unsigned)
 Enrolled patient for a 14 day Zio XT  monitor to be mailed to patients home

## 2023-11-15 DIAGNOSIS — R41841 Cognitive communication deficit: Secondary | ICD-10-CM | POA: Diagnosis not present

## 2023-11-19 DIAGNOSIS — R41841 Cognitive communication deficit: Secondary | ICD-10-CM | POA: Diagnosis not present

## 2023-11-22 DIAGNOSIS — R41841 Cognitive communication deficit: Secondary | ICD-10-CM | POA: Diagnosis not present

## 2023-11-26 DIAGNOSIS — R41841 Cognitive communication deficit: Secondary | ICD-10-CM | POA: Diagnosis not present

## 2023-11-27 DIAGNOSIS — H04123 Dry eye syndrome of bilateral lacrimal glands: Secondary | ICD-10-CM | POA: Diagnosis not present

## 2023-11-27 DIAGNOSIS — H40053 Ocular hypertension, bilateral: Secondary | ICD-10-CM | POA: Diagnosis not present

## 2023-11-27 DIAGNOSIS — H353221 Exudative age-related macular degeneration, left eye, with active choroidal neovascularization: Secondary | ICD-10-CM | POA: Diagnosis not present

## 2023-11-27 DIAGNOSIS — Z961 Presence of intraocular lens: Secondary | ICD-10-CM | POA: Diagnosis not present

## 2023-11-27 NOTE — Progress Notes (Signed)
 [facility] - Clinic Note  12/03/2023   CHIEF COMPLAINT Patient presents for Retina Evaluation  HISTORY OF PRESENT ILLNESS: Rachel Morris is a 89 y.o. female who presents to the clinic today for:  HPI     Retina Evaluation   In left eye.  This started 5 days ago.  Duration of 5 days.  Context:  distance vision and mid-range vision.  I, the attending physician,  performed the HPI with the patient and updated documentation appropriately.        Comments   Retina eval per Dr Octavia CMV OS pt is reporting heavy sensation and pressure feeling OS she reports vision is not as sharp she denies any flashes or floaters pt take dorz bid ou and latanoprost  ou at bedtime       Last edited by Valdemar Rogue, MD on 12/03/2023  4:35 PM.    Pt is here on the referral of Dr. Glendia Octavia for concern of CME OS, pt states she saw him for a routine exam for her glaucoma, pts daughter states pt had been telling her for several weeks that her left eye vision seemed decreased, pt denies any inflammatory or rheumatologic disease, pt is on latanoprost  at night OU and Cosopt BID OU, pt had cataract sx OU with Dr. FABIENE Octavia several years ago   Referring physician: Octavia Charlie Glendia, MD [address] [place],  [place] [address]  HISTORICAL INFORMATION:  Selected notes from the MEDICAL RECORD NUMBER Referred by Dr. Glendia Octavia for CME OS LEE:  Ocular Hx- PMH-   CURRENT MEDICATIONS: Current Outpatient Medications (Ophthalmic Drugs)  Medication Sig   Bromfenac  Sodium 0.07 % SOLN Place 1 drop into the left eye 4 (four) times daily.   dorzolamide-timolol (COSOPT) 2-0.5 % ophthalmic solution 1 drop 2 (two) times daily.   latanoprost  (XALATAN ) 0.005 % ophthalmic solution Place 1 drop into both eyes at bedtime.   prednisoLONE  acetate (PRED FORTE ) 1 % ophthalmic suspension Place 1 drop into the left eye 4 (four) times daily.   No current facility-administered medications for this visit.  (Ophthalmic Drugs)   Current Outpatient Medications (Other)  Medication Sig   acetaminophen  (TYLENOL ) 325 MG tablet Take 650 mg by mouth every 6 (six) hours as needed for mild pain.   cholecalciferol  (VITAMIN D ) 1000 UNITS tablet Take 1,000 Units by mouth daily.   EPINEPHrine  (EPIPEN  2-PAK) 0.3 mg/0.3 mL IJ SOAJ injection Inject 0.3 mg into the muscle as needed for anaphylaxis.   fluticasone (FLONASE) 50 MCG/ACT nasal spray Place 2 sprays into both nostrils daily.   gabapentin  (NEURONTIN ) 100 MG capsule TAKE 1 CAPSULE BY MOUTH AT BEDTIME   GARLIC  PO Take 1 tablet by mouth daily.   rosuvastatin  (CRESTOR ) 5 MG tablet TAKE 1 TABLET BY MOUTH EVERY DAY   senna (SENOKOT) 8.6 MG TABS tablet Take 1 tablet by mouth daily as needed for mild constipation.   No current facility-administered medications for this visit. (Other)   REVIEW OF SYSTEMS: ROS   Positive for: Cardiovascular, Eyes Last edited by Resa Delon ORN, COT on 12/03/2023  2:13 PM.     ALLERGIES Allergies  Allergen Reactions   Brandy [Alcohol] Anaphylaxis    Related to grapes   Prune Anaphylaxis    PLUMS,GRAPES,RAISINS   Aspirin  Other (See Comments)    Stomach bleed   Augmentin [Amoxicillin-Pot Clavulanate] Hives and Itching   Clindamycin Other (See Comments)    unkown   Omnicef [Cefdinir] Hives  Vinegar [Acetic Acid]     basalmic   Alprazolam Rash   Ceftin [Cefuroxime Axetil] Rash   Doxycycline  Rash   Eryc [Erythromycin] Rash   Penicillins Rash   PAST MEDICAL HISTORY Past Medical History:  Diagnosis Date   Allergic rhinitis due to pollen    Anxiety state, unspecified    Arthritis    RIGHT LEG, HANDS.   Cancer (HCC)    FACE/NOSE -SKIN-squamous cell and basal cell.   Disorder of bone and cartilage, unspecified    Lumbago    Lyme borreliosis    Other atopic dermatitis and related conditions    Other dysphagia    Reflux esophagitis    Senile osteoporosis    TMJ dysfunction    MORE ON LEFT WEARS BITE  PROTECTION AT NIGHT   Past Surgical History:  Procedure Laterality Date   APPENDECTOMY  1950   CATARACT EXTRACTION, BILATERAL Bilateral    EYE SURGERY Right    right eye ptrigium excision x2   HERNIA REPAIR Bilateral    TOTAL HIP ARTHROPLASTY Right 12/10/2015   Procedure: RIGHT TOTAL HIP ARTHROPLASTY ANTERIOR APPROACH;  Surgeon: Lonni CINDERELLA Poli, MD;  Location: [facility];  Service: Orthopedics;  Laterality: Right;  Spinal to General   FAMILY HISTORY History reviewed. No pertinent family history. SOCIAL HISTORY Social History   Tobacco Use   Smoking status: Never    Passive exposure: Past   Smokeless tobacco: Never   Tobacco comments:    past seconday smoke from husband  Vaping Use   Vaping status: Never Used  Substance Use Topics   Alcohol use: Not Currently    Alcohol/week: 0.0 standard drinks of alcohol   Drug use: No       OPHTHALMIC EXAM:  Base Eye Exam     Visual Acuity (Snellen - Linear)       Right Left   Dist cc 20/20 -1 20/50   Dist ph cc  NI         Tonometry (Tonopen, 2:18 PM)       Right Left   Pressure 9 11         Pupils       Pupils Dark Light Shape React APD   Right PERRL 3 2 Round 2 None   Left PERRL 3 2 Round 2 None         Visual Fields       Left Right    Full Full         Extraocular Movement       Right Left    Full, Ortho Full, Ortho         Neuro/Psych     Oriented x3: Yes   Mood/Affect: Normal         Dilation     Both eyes: 2.5% Phenylephrine @ 2:18 PM           Slit Lamp and Fundus Exam     Slit Lamp Exam       Right Left   Lids/Lashes Dermatochalasis - upper lid Dermatochalasis - upper lid   Conjunctiva/Sclera White and quiet White and quiet   Cornea arcus, 1+ Punctate epithelial erosions, trace tear film debris arcus, 1-2+ fine Punctate epithelial erosions, trace tear film debris   Anterior Chamber deep and clear deep and clear, no cell or flare   Iris Round and moderately dilated  Round and moderately dilated   Lens PC IOL in good position 3 piece PC IOL in good position  Anterior Vitreous syneresis Mild syneresis         Fundus Exam       Right Left   Disc Pink and Sharp Pink and Sharp, mild peripapillary drusen   C/D Ratio 0.3 0.2   Macula Flat, Blunted foveal reflex, RPE mottling, mild drusen nasal mac Flat, Blunted foveal reflex, central edema / CME, no heme   Vessels attenuated, Tortuous attenuated, Tortuous   Periphery Attached, midzonal drusen, No heme Attached, midzonal drusen, No heme           Refraction     Wearing Rx       Sphere Cylinder Axis Add   Right -2.75 +0.75 009 +2.75   Left -1.75 +1.75 170 +2.75         Manifest Refraction   Unable to correct os any better           IMAGING AND PROCEDURES  Imaging and Procedures for 12/03/2023  OCT, Retina - OU - Both Eyes       Right Eye Quality was good. Central Foveal Thickness: 288. Progression has no prior data. Findings include normal foveal contour, no IRF, no SRF.   Left Eye Quality was good. Central Foveal Thickness: 575. Progression has no prior data. Findings include abnormal foveal contour, intraretinal fluid, subretinal fluid (Central edema with +IRF / SRF).   Notes *Images captured and stored on drive  Diagnosis / Impression:  OD: NFP, no IRF/SRF OS: Central edema with +IRF / SRF  Clinical management:  See below  Abbreviations: NFP - Normal foveal profile. CME - cystoid macular edema. PED - pigment epithelial detachment. IRF - intraretinal fluid. SRF - subretinal fluid. EZ - ellipsoid zone. ERM - epiretinal membrane. ORA - outer retinal atrophy. ORT - outer retinal tubulation. SRHM - subretinal hyper-reflective material. IRHM - intraretinal hyper-reflective material      Fluorescein  Angiography Optos (Transit OS)       Right Eye Progression has no prior data. Early phase findings include normal observations. Mid/Late phase findings include normal  observations.   Left Eye Progression has no prior data. Early phase findings include normal observations. Mid/Late phase findings include leakage, staining (Perifoveal petaloid hyperfluorescent leakage, hyperfluorescent staining of disc).   Notes **Images stored on drive**  Impression: OD: normal study OS: Perifoveal petaloid hyperfluorescent leakage, hyperfluorescent staining of disc           ASSESSMENT/PLAN:   ICD-10-CM   1. CME (cystoid macular edema), left  H35.352 OCT, Retina - OU - Both Eyes    Fluorescein  Angiography Optos (Transit OS)    2. Intermediate stage nonexudative age-related macular degeneration of both eyes  H35.3132     3. Pseudophakia, both eyes  Z96.1      1. CME OS  - incidental finding on routine exam by Dr. Octavia  - pt reports vision decreased OS for several weeks  - OCT shows central CME with +IRF/SRF  - FA 08.04.25 shows OS Perifoveal petaloid hyperfluorescent leakage, hyperfluorescent staining of disc -- consistent w/ CME  - discussed findings, prognosis and treatment options  - unclear etiology, but will start PF and bromfenac  QID OS  - also recommend stopping latanoprost  in both eyes for now  - f/u 4 weeks -- DFE/OCT  2. Age related macular degeneration, non-exudative, both eyes  - The incidence, anatomy, and pathology of dry AMD, risk of progression, and the AREDS and AREDS 2 study including smoking risks discussed with patient.  - Recommend amsler grid monitoring  - monitor  3. Pseudophakia OU  - s/p CE/IOL OU (Dr. Lamar Gaudy)  - IOLs in good position, doing well  - monitor   Ophthalmic Meds Ordered this visit:  Meds ordered this encounter  Medications   Bromfenac  Sodium 0.07 % SOLN    Sig: Place 1 drop into the left eye 4 (four) times daily.    Dispense:  6 mL    Refill:  3   prednisoLONE  acetate (PRED FORTE ) 1 % ophthalmic suspension    Sig: Place 1 drop into the left eye 4 (four) times daily.    Dispense:  15 mL     Refill:  0     Return in about 4 weeks (around 12/31/2023) for f/u CME OS, DFE, OCT.  There are no Patient Instructions on file for this visit.  Explained the diagnoses, plan, and follow up with the patient and they expressed understanding.  Patient expressed understanding of the importance of proper follow up care.   This document serves as a record of services personally performed by Redell JUDITHANN Hans, MD, PhD. It was created on their behalf by Avelina Pereyra, COA an ophthalmic technician. The creation of this record is the provider's dictation and/or activities during the visit.   Electronically signed by: Avelina GORMAN Pereyra, COT  12/03/23  4:36 PM   This document serves as a record of services personally performed by Redell JUDITHANN Hans, MD, PhD. It was created on their behalf by Alan PARAS. Delores, OA an ophthalmic technician. The creation of this record is the provider's dictation and/or activities during the visit.    Electronically signed by: Alan PARAS. Delores, OA 12/03/23 4:36 PM  Redell JUDITHANN Hans, M.D., Ph.D. Diseases & Surgery of the Retina and Vitreous [facility] 12/03/2023  I have reviewed the above documentation for accuracy and completeness, and I agree with the above. Redell JUDITHANN Hans, M.D., Ph.D. 12/03/23 4:44 PM   Abbreviations: M myopia (nearsighted); A astigmatism; H hyperopia (farsighted); P presbyopia; Mrx spectacle prescription;  CTL contact lenses; OD right eye; OS left eye; OU both eyes  XT exotropia; ET esotropia; PEK punctate epithelial keratitis; PEE punctate epithelial erosions; DES dry eye syndrome; MGD meibomian gland dysfunction; ATs artificial tears; PFAT's preservative free artificial tears; NSC nuclear sclerotic cataract; PSC posterior subcapsular cataract; ERM epi-retinal membrane; PVD posterior vitreous detachment; RD retinal detachment; DM diabetes mellitus; DR diabetic retinopathy; NPDR non-proliferative diabetic retinopathy; PDR proliferative  diabetic retinopathy; CSME clinically significant macular edema; DME diabetic macular edema; dbh dot blot hemorrhages; CWS cotton wool spot; POAG primary open angle glaucoma; C/D cup-to-disc ratio; HVF humphrey visual field; GVF goldmann visual field; OCT optical coherence tomography; IOP intraocular pressure; BRVO Branch retinal vein occlusion; CRVO central retinal vein occlusion; CRAO central retinal artery occlusion; BRAO branch retinal artery occlusion; RT retinal tear; SB scleral buckle; PPV pars plana vitrectomy; VH Vitreous hemorrhage; PRP panretinal laser photocoagulation; IVK intravitreal kenalog ; VMT vitreomacular traction; MH Macular hole;  NVD neovascularization of the disc; NVE neovascularization elsewhere; AREDS age related eye disease study; ARMD age related macular degeneration; POAG primary open angle glaucoma; EBMD epithelial/anterior basement membrane dystrophy; ACIOL anterior chamber intraocular lens; IOL intraocular lens; PCIOL posterior chamber intraocular lens; Phaco/IOL phacoemulsification with intraocular lens placement; PRK photorefractive keratectomy; LASIK laser assisted in situ keratomileusis; HTN hypertension; DM diabetes mellitus; COPD chronic obstructive pulmonary disease

## 2023-11-29 DIAGNOSIS — R41841 Cognitive communication deficit: Secondary | ICD-10-CM | POA: Diagnosis not present

## 2023-12-03 ENCOUNTER — Encounter (INDEPENDENT_AMBULATORY_CARE_PROVIDER_SITE_OTHER): Payer: Self-pay | Admitting: Ophthalmology

## 2023-12-03 ENCOUNTER — Ambulatory Visit (INDEPENDENT_AMBULATORY_CARE_PROVIDER_SITE_OTHER): Admitting: Ophthalmology

## 2023-12-03 VITALS — BP 116/73 | HR 60

## 2023-12-03 DIAGNOSIS — R41841 Cognitive communication deficit: Secondary | ICD-10-CM | POA: Diagnosis not present

## 2023-12-03 DIAGNOSIS — M6281 Muscle weakness (generalized): Secondary | ICD-10-CM | POA: Diagnosis not present

## 2023-12-03 DIAGNOSIS — H35352 Cystoid macular degeneration, left eye: Secondary | ICD-10-CM | POA: Diagnosis not present

## 2023-12-03 DIAGNOSIS — H353132 Nonexudative age-related macular degeneration, bilateral, intermediate dry stage: Secondary | ICD-10-CM | POA: Diagnosis not present

## 2023-12-03 DIAGNOSIS — M25561 Pain in right knee: Secondary | ICD-10-CM | POA: Diagnosis not present

## 2023-12-03 DIAGNOSIS — M25551 Pain in right hip: Secondary | ICD-10-CM | POA: Diagnosis not present

## 2023-12-03 DIAGNOSIS — H3581 Retinal edema: Secondary | ICD-10-CM

## 2023-12-03 DIAGNOSIS — Z961 Presence of intraocular lens: Secondary | ICD-10-CM

## 2023-12-03 MED ORDER — PREDNISOLONE ACETATE 1 % OP SUSP: 1.0000 [drp] | Freq: Four times a day (QID) | OPHTHALMIC | 0 refills | Status: DC

## 2023-12-03 MED ORDER — BROMFENAC SODIUM 0.07 % OP SOLN: 1.0000 [drp] | Freq: Four times a day (QID) | OPHTHALMIC | 3 refills | Status: DC

## 2023-12-06 DIAGNOSIS — M25561 Pain in right knee: Secondary | ICD-10-CM | POA: Diagnosis not present

## 2023-12-06 DIAGNOSIS — R41841 Cognitive communication deficit: Secondary | ICD-10-CM | POA: Diagnosis not present

## 2023-12-06 DIAGNOSIS — M25551 Pain in right hip: Secondary | ICD-10-CM | POA: Diagnosis not present

## 2023-12-06 DIAGNOSIS — M6281 Muscle weakness (generalized): Secondary | ICD-10-CM | POA: Diagnosis not present

## 2023-12-10 DIAGNOSIS — M25551 Pain in right hip: Secondary | ICD-10-CM | POA: Diagnosis not present

## 2023-12-10 DIAGNOSIS — M25561 Pain in right knee: Secondary | ICD-10-CM | POA: Diagnosis not present

## 2023-12-10 DIAGNOSIS — M6281 Muscle weakness (generalized): Secondary | ICD-10-CM | POA: Diagnosis not present

## 2023-12-10 DIAGNOSIS — R41841 Cognitive communication deficit: Secondary | ICD-10-CM | POA: Diagnosis not present

## 2023-12-11 DIAGNOSIS — R42 Dizziness and giddiness: Secondary | ICD-10-CM | POA: Diagnosis not present

## 2023-12-11 DIAGNOSIS — R0609 Other forms of dyspnea: Secondary | ICD-10-CM | POA: Diagnosis not present

## 2023-12-12 DIAGNOSIS — M6281 Muscle weakness (generalized): Secondary | ICD-10-CM | POA: Diagnosis not present

## 2023-12-12 DIAGNOSIS — M25551 Pain in right hip: Secondary | ICD-10-CM | POA: Diagnosis not present

## 2023-12-12 DIAGNOSIS — R41841 Cognitive communication deficit: Secondary | ICD-10-CM | POA: Diagnosis not present

## 2023-12-12 DIAGNOSIS — M25561 Pain in right knee: Secondary | ICD-10-CM | POA: Diagnosis not present

## 2023-12-16 ENCOUNTER — Ambulatory Visit: Payer: Self-pay | Admitting: Internal Medicine

## 2023-12-16 DIAGNOSIS — R0609 Other forms of dyspnea: Secondary | ICD-10-CM

## 2023-12-16 DIAGNOSIS — R42 Dizziness and giddiness: Secondary | ICD-10-CM

## 2023-12-17 DIAGNOSIS — M25561 Pain in right knee: Secondary | ICD-10-CM | POA: Diagnosis not present

## 2023-12-17 DIAGNOSIS — R41841 Cognitive communication deficit: Secondary | ICD-10-CM | POA: Diagnosis not present

## 2023-12-17 DIAGNOSIS — M25551 Pain in right hip: Secondary | ICD-10-CM | POA: Diagnosis not present

## 2023-12-17 DIAGNOSIS — M6281 Muscle weakness (generalized): Secondary | ICD-10-CM | POA: Diagnosis not present

## 2023-12-19 DIAGNOSIS — M25561 Pain in right knee: Secondary | ICD-10-CM | POA: Diagnosis not present

## 2023-12-19 DIAGNOSIS — M6281 Muscle weakness (generalized): Secondary | ICD-10-CM | POA: Diagnosis not present

## 2023-12-19 DIAGNOSIS — R41841 Cognitive communication deficit: Secondary | ICD-10-CM | POA: Diagnosis not present

## 2023-12-19 DIAGNOSIS — M25551 Pain in right hip: Secondary | ICD-10-CM | POA: Diagnosis not present

## 2023-12-20 ENCOUNTER — Non-Acute Institutional Stay: Payer: Self-pay | Admitting: Internal Medicine

## 2023-12-20 ENCOUNTER — Encounter: Payer: Self-pay | Admitting: Internal Medicine

## 2023-12-20 DIAGNOSIS — E785 Hyperlipidemia, unspecified: Secondary | ICD-10-CM

## 2023-12-20 DIAGNOSIS — R0789 Other chest pain: Secondary | ICD-10-CM

## 2023-12-20 DIAGNOSIS — I499 Cardiac arrhythmia, unspecified: Secondary | ICD-10-CM

## 2023-12-20 DIAGNOSIS — G4700 Insomnia, unspecified: Secondary | ICD-10-CM | POA: Diagnosis not present

## 2023-12-20 DIAGNOSIS — R4189 Other symptoms and signs involving cognitive functions and awareness: Secondary | ICD-10-CM

## 2023-12-20 DIAGNOSIS — R42 Dizziness and giddiness: Secondary | ICD-10-CM | POA: Diagnosis not present

## 2023-12-20 DIAGNOSIS — M720 Palmar fascial fibromatosis [Dupuytren]: Secondary | ICD-10-CM

## 2023-12-20 DIAGNOSIS — F339 Major depressive disorder, recurrent, unspecified: Secondary | ICD-10-CM

## 2023-12-20 NOTE — Progress Notes (Unsigned)
 Location:  [facility] Room Number: AL06-A Place of Service:  ALF [phone]) Provider:  Charlanne Fredia CROME, MD  Patient Care Team: Charlanne Fredia CROME, MD as PCP - General (Internal Medicine) Mast, Man X, NP as Nurse Practitioner (Internal Medicine) Octavia Charleston, MD as Referring Physician (Ophthalmology) Prentiss Annabella LABOR, NP as Nurse Practitioner (Gynecology)  Extended Emergency Contact Information Primary Emergency Contact: Jarold Sonny [place] [address] [place] Mobile Phone: [phone] Relation: Daughter  Code Status:  DNR Goals of care: Advanced Directive information    08/01/2023    1:28 PM  Advanced Directives  Does Patient Have a Medical Advance Directive? Yes  Type of Estate agent of [place];Living will;Out of facility DNR (pink MOST or yellow form)  Does patient want to make changes to medical advance directive? No - Patient declined  Copy of Healthcare Power of Attorney in Chart? Yes - validated most recent copy scanned in chart (See row information)     Chief Complaint  Patient presents with   Medical Management of Chronic Issues    Follow up    HPI:  Pt is a 89 y.o. female seen today for medical management of chronic diseases.    Patient is getting admitted to [place] for her Progressive Cognitive issues  [place] [facility]  Patient has history of hyper lipidemia,  arthritis status post right hip arthroplasty,   essential tremors, anxiety and constipation  H/o Alcohol Abuse. Quit since 12/20  Has stayed on Gabapentin  at night  Plantar Fascitis H/o Cognitive impairment Some worsening recently and daughter decided to move her to [place]  Dizziness and Atypical chest pain Seen By cardiology Monitor was negative for anything concerning Echo Pending  Recent Diagnosis of Cystoid Macular Edema On Number of new Eye drops  Had no Complains today Per nurses No behaviors And doing well with her  move Walks with no issues No falls Past Medical History:  Diagnosis Date   Allergic rhinitis due to pollen    Anxiety state, unspecified    Arthritis    RIGHT LEG, HANDS.   Cancer (HCC)    FACE/NOSE -SKIN-squamous cell and basal cell.   Disorder of bone and cartilage, unspecified    Lumbago    Lyme borreliosis    Other atopic dermatitis and related conditions    Other dysphagia    Reflux esophagitis    Senile osteoporosis    TMJ dysfunction    MORE ON LEFT WEARS BITE PROTECTION AT NIGHT   Past Surgical History:  Procedure Laterality Date   APPENDECTOMY  1950   CATARACT EXTRACTION, BILATERAL Bilateral    EYE SURGERY Right    right eye ptrigium excision x2   HERNIA REPAIR Bilateral    TOTAL HIP ARTHROPLASTY Right 12/10/2015   Procedure: RIGHT TOTAL HIP ARTHROPLASTY ANTERIOR APPROACH;  Surgeon: Lonni CINDERELLA Poli, MD;  Location: [facility];  Service: Orthopedics;  Laterality: Right;  Spinal to General    Allergies  Allergen Reactions   Brandy [Alcohol] Anaphylaxis    Related to grapes   Prune Anaphylaxis    PLUMS,GRAPES,RAISINS   Aspirin  Other (See Comments)    Stomach bleed   Augmentin [Amoxicillin-Pot Clavulanate] Hives and Itching   Clindamycin Other (See Comments)    unkown   Omnicef [Cefdinir] Hives   Vinegar [Acetic Acid]     basalmic   Alprazolam Rash   Ceftin [Cefuroxime Axetil] Rash   Doxycycline  Rash  Eryc [Erythromycin] Rash   Penicillins Rash    Outpatient Encounter Medications as of 12/20/2023  Medication Sig   Bromfenac  Sodium 0.07 % SOLN Place 1 drop into the left eye 4 (four) times daily.   cholecalciferol  (VITAMIN D ) 1000 UNITS tablet Take 1,000 Units by mouth daily.   dorzolamide-timolol (COSOPT) 2-0.5 % ophthalmic solution 1 drop 2 (two) times daily.   fluticasone (FLONASE) 50 MCG/ACT nasal spray Place 2 sprays into both nostrils daily.   prednisoLONE  acetate (PRED FORTE ) 1 % ophthalmic suspension Place 1 drop into the left eye 4 (four)  times daily.   rosuvastatin  (CRESTOR ) 5 MG tablet TAKE 1 TABLET BY MOUTH EVERY DAY   tuberculin (TUBERSOL) 5 UNIT/0.1ML injection Inject 0.1 mLs into the skin daily.   acetaminophen  (TYLENOL ) 325 MG tablet Take 650 mg by mouth every 6 (six) hours as needed for mild pain.   acetaminophen  (TYLENOL ) 500 MG tablet Take 1,000 mg by mouth as needed.   EPINEPHrine  (EPIPEN  2-PAK) 0.3 mg/0.3 mL IJ SOAJ injection Inject 0.3 mg into the muscle as needed for anaphylaxis.   gabapentin  (NEURONTIN ) 100 MG capsule TAKE 1 CAPSULE BY MOUTH AT BEDTIME (Patient not taking: Reported on 12/20/2023)   GARLIC  PO Take 1 tablet by mouth daily. (Patient not taking: Reported on 12/20/2023)   latanoprost  (XALATAN ) 0.005 % ophthalmic solution Place 1 drop into both eyes at bedtime. (Patient not taking: Reported on 12/20/2023)   senna (SENOKOT) 8.6 MG TABS tablet Take 1 tablet by mouth daily as needed for mild constipation.   No facility-administered encounter medications on file as of 12/20/2023.    Review of Systems  Constitutional:  Negative for activity change and appetite change.  HENT: Negative.    Eyes:  Positive for visual disturbance.  Respiratory:  Negative for cough and shortness of breath.   Cardiovascular:  Negative for leg swelling.  Gastrointestinal:  Negative for constipation.  Genitourinary: Negative.   Musculoskeletal:  Negative for arthralgias, gait problem and myalgias.  Skin: Negative.   Neurological:  Negative for dizziness and weakness.  Psychiatric/Behavioral:  Positive for confusion. Negative for dysphoric mood and sleep disturbance.     Immunization History  Administered Date(s) Administered   Fluad Quad(high Dose 65+) 02/15/2022   Fluad Trivalent(High Dose 65+) 02/27/2023   Hepatitis B 05/01/2010, 07/31/2010, 11/30/2010   Influenza Whole 05/01/2010   Influenza, High Dose Seasonal PF 02/07/2017, 02/12/2019, 01/20/2020, 02/03/2021   Influenza,inj,Quad PF,6+ Mos 02/06/2013, 01/31/2018    Influenza-Unspecified 02/15/2012, 02/27/2014, 03/02/2015, 02/10/2016   Moderna Sars-Covid-2 Vaccination 05/05/2019, 06/02/2019, 03/15/2020, 10/06/2020, 03/07/2022   PPD Test 04/04/2013   Pfizer Covid-19 Vaccine Bivalent Booster 29yrs & up 01/19/2021   Pneumococcal Conjugate-13 04/06/2014   Pneumococcal Polysaccharide-23 05/01/2010   Td 05/02/2003   Tdap 05/04/2015   Zoster Recombinant(Shingrix) 12/27/2017, 03/14/2018   Zoster, Live 05/01/2010   Pertinent  Health Maintenance Due  Topic Date Due   INFLUENZA VACCINE  11/30/2023   DEXA SCAN  Completed      08/23/2022    2:10 PM 09/20/2022   11:12 AM 01/08/2023    1:05 PM 02/28/2023   11:20 AM 05/30/2023    1:07 PM  Fall Risk  Falls in the past year? 0 0 0 0 0  Was there an injury with Fall? 0 0 0 0 0  Fall Risk Category Calculator 0 0 0 0 0  Patient at Risk for Falls Due to No Fall Risks No Fall Risks No Fall Risks No Fall Risks No Fall Risks  Fall risk  Follow up Falls evaluation completed  Falls evaluation completed;Education provided;Falls prevention discussed Falls evaluation completed Falls evaluation completed   Functional Status Survey:    Vitals:   12/20/23 1035  BP: 128/70  Pulse: 72  Temp: (!) 96 F (35.6 C)  SpO2: 96%  Weight: 156 lb 12.8 oz (71.1 kg)  Height: 5' 3 (1.6 m)   Body mass index is 27.78 kg/m. Physical Exam Vitals reviewed.  Constitutional:      Appearance: Normal appearance.  HENT:     Head: Normocephalic.     Nose: Nose normal.     Mouth/Throat:     Mouth: Mucous membranes are moist.     Pharynx: Oropharynx is clear.  Eyes:     Pupils: Pupils are equal, round, and reactive to light.  Cardiovascular:     Rate and Rhythm: Normal rate and regular rhythm.     Pulses: Normal pulses.     Heart sounds: Normal heart sounds. No murmur heard. Pulmonary:     Effort: Pulmonary effort is normal.     Breath sounds: Normal breath sounds.  Abdominal:     General: Abdomen is flat. Bowel sounds are  normal.     Palpations: Abdomen is soft.  Musculoskeletal:        General: No swelling.     Cervical back: Neck supple.  Skin:    General: Skin is warm.  Neurological:     General: No focal deficit present.     Mental Status: She is alert.  Psychiatric:        Mood and Affect: Mood normal.        Thought Content: Thought content normal.     Labs reviewed: Recent Labs    02/22/23 0820 05/31/23 0810  NA 140 139  K 4.5 4.6  CL 106 108  CO2 24 25  GLUCOSE 89 87  BUN 19 20  CREATININE 0.93 0.87  CALCIUM  9.8 9.6  MG  --  2.3   Recent Labs    02/22/23 0820 05/31/23 0810  AST 14 15  ALT 13 13  BILITOT 0.4 0.4  PROT 6.6 6.4   Recent Labs    02/22/23 0820 05/31/23 0810  WBC 9.3 9.0  NEUTROABS 5,803 5,319  HGB 13.3 12.9  HCT 41.1 38.9  MCV 93.0 92.0  PLT 312 286   Lab Results  Component Value Date   TSH 4.30 05/31/2023   No results found for: HGBA1C Lab Results  Component Value Date   CHOL 203 (H) 02/22/2023   HDL 51 02/22/2023   LDLCALC 122 (H) 02/22/2023   TRIG 180 (H) 02/22/2023   CHOLHDL 4.0 02/22/2023    Significant Diagnostic Results in last 30 days:  LONG TERM MONITOR (3-14 DAYS) Result Date: 12/16/2023   Patient had a minimum heart rate of 46 bpm, maximum heart rate of 188 bpm, and average heart rate of 69 bpm. Predominant underlying rhythm was sinus rhythm. One rare 4 beat NSVT. Asymptomatic paroxsymal SVT. Isolated PACs were rare (<1.0%). Isolated PVCs were rare (<1.0%). Triggered and diary events associated with sinus rhythm. No malignant arrhythmias.  Fluorescein  Angiography Optos (Transit OS) Result Date: 12/03/2023 Right Eye Progression has no prior data. Early phase findings include normal observations. Mid/Late phase findings include normal observations. Left Eye Progression has no prior data. Early phase findings include normal observations. Mid/Late phase findings include leakage, staining (Perifoveal petaloid hyperfluorescent leakage,  hyperfluorescent staining of disc). Notes **Images stored on drive** Impression: OD: normal study OS: Perifoveal petaloid  hyperfluorescent leakage, hyperfluorescent staining of disc   OCT, Retina - OU - Both Eyes Result Date: 12/03/2023 Right Eye Quality was good. Central Foveal Thickness: 288. Progression has no prior data. Findings include normal foveal contour, no IRF, no SRF. Left Eye Quality was good. Central Foveal Thickness: 575. Progression has no prior data. Findings include abnormal foveal contour, intraretinal fluid, subretinal fluid (Central edema with +IRF / SRF). Notes *Images captured and stored on drive Diagnosis / Impression: OD: NFP, no IRF/SRF OS: Central edema with +IRF / SRF Clinical management: See below Abbreviations: NFP - Normal foveal profile. CME - cystoid macular edema. PED - pigment epithelial detachment. IRF - intraretinal fluid. SRF - subretinal fluid. EZ - ellipsoid zone. ERM - epiretinal membrane. ORA - outer retinal atrophy. ORT - outer retinal tubulation. SRHM - subretinal hyper-reflective material. IRHM - intraretinal hyper-reflective material    Assessment/Plan 1. Dizziness (Primary) Cardiac Monitor negative Will Monitor here in [place]  2. Irregular heart rhythm Cardiac Monitor Nothing concerning  3. Atypical chest pain Echo pending  4. Depression, recurrent (HCC) No Meds for now  5. Cognitive impairment Moving in [place]  6. Planter Fascitis Has Inserts  7. Insomnia, unspecified type Gabapentin    8. Hyperlipidemia with target LDL less than 100 Statin Repeat Labs   Family/ staff Communication:   Labs/tests ordered:  CBC,CMP,TSH,Lipid

## 2023-12-21 MED ORDER — GABAPENTIN 100 MG PO CAPS: 100.0000 mg | ORAL_CAPSULE | Freq: Every day | ORAL | Status: AC

## 2023-12-24 DIAGNOSIS — M25551 Pain in right hip: Secondary | ICD-10-CM | POA: Diagnosis not present

## 2023-12-24 DIAGNOSIS — M25561 Pain in right knee: Secondary | ICD-10-CM | POA: Diagnosis not present

## 2023-12-24 DIAGNOSIS — M6281 Muscle weakness (generalized): Secondary | ICD-10-CM | POA: Diagnosis not present

## 2023-12-24 DIAGNOSIS — R41841 Cognitive communication deficit: Secondary | ICD-10-CM | POA: Diagnosis not present

## 2023-12-25 ENCOUNTER — Ambulatory Visit (HOSPITAL_COMMUNITY)
Admission: RE | Admit: 2023-12-25 | Discharge: 2023-12-25 | Disposition: A | Discharge status: Home / Self Care | Source: Ambulatory Visit | Attending: Cardiovascular Disease | Admitting: Cardiovascular Disease

## 2023-12-25 DIAGNOSIS — M6281 Muscle weakness (generalized): Secondary | ICD-10-CM | POA: Diagnosis not present

## 2023-12-25 DIAGNOSIS — M25551 Pain in right hip: Secondary | ICD-10-CM | POA: Diagnosis not present

## 2023-12-25 DIAGNOSIS — R41841 Cognitive communication deficit: Secondary | ICD-10-CM | POA: Diagnosis not present

## 2023-12-25 DIAGNOSIS — R0609 Other forms of dyspnea: Secondary | ICD-10-CM | POA: Diagnosis not present

## 2023-12-25 DIAGNOSIS — R42 Dizziness and giddiness: Secondary | ICD-10-CM | POA: Insufficient documentation

## 2023-12-25 DIAGNOSIS — M25561 Pain in right knee: Secondary | ICD-10-CM | POA: Diagnosis not present

## 2023-12-25 LAB — ECHOCARDIOGRAM COMPLETE
Area-P 1/2: 3.7 cm2
S' Lateral: 2.8 cm

## 2023-12-26 ENCOUNTER — Encounter: Payer: Self-pay | Admitting: Internal Medicine

## 2023-12-26 ENCOUNTER — Other Ambulatory Visit: Payer: Self-pay | Admitting: Orthopedic Surgery

## 2023-12-26 DIAGNOSIS — M25561 Pain in right knee: Secondary | ICD-10-CM | POA: Diagnosis not present

## 2023-12-26 DIAGNOSIS — M25551 Pain in right hip: Secondary | ICD-10-CM | POA: Diagnosis not present

## 2023-12-26 DIAGNOSIS — R41841 Cognitive communication deficit: Secondary | ICD-10-CM | POA: Diagnosis not present

## 2023-12-26 DIAGNOSIS — M6281 Muscle weakness (generalized): Secondary | ICD-10-CM | POA: Diagnosis not present

## 2023-12-26 MED ORDER — ROSUVASTATIN CALCIUM 5 MG PO TABS: 5.0000 mg | ORAL_TABLET | ORAL | Status: AC

## 2023-12-27 DIAGNOSIS — M25551 Pain in right hip: Secondary | ICD-10-CM | POA: Diagnosis not present

## 2023-12-27 DIAGNOSIS — R41841 Cognitive communication deficit: Secondary | ICD-10-CM | POA: Diagnosis not present

## 2023-12-27 DIAGNOSIS — M6281 Muscle weakness (generalized): Secondary | ICD-10-CM | POA: Diagnosis not present

## 2023-12-27 DIAGNOSIS — M25561 Pain in right knee: Secondary | ICD-10-CM | POA: Diagnosis not present

## 2023-12-28 DIAGNOSIS — R41841 Cognitive communication deficit: Secondary | ICD-10-CM | POA: Diagnosis not present

## 2023-12-28 DIAGNOSIS — M25561 Pain in right knee: Secondary | ICD-10-CM | POA: Diagnosis not present

## 2023-12-28 DIAGNOSIS — M6281 Muscle weakness (generalized): Secondary | ICD-10-CM | POA: Diagnosis not present

## 2023-12-28 DIAGNOSIS — M25551 Pain in right hip: Secondary | ICD-10-CM | POA: Diagnosis not present

## 2023-12-28 NOTE — Progress Notes (Signed)
 [facility] - Clinic Note  01/02/2024   CHIEF COMPLAINT Patient presents for Retina Follow Up  HISTORY OF PRESENT ILLNESS: Rachel Morris is a 89 y.o. female who presents to the clinic today for:  HPI     Retina Follow Up   Diagnosis: CME.  In left eye.  This started 5 weeks ago.  Severity is moderate.  Duration of 4 weeks.        Comments   Pt states no changes in vision. Pt denies FOL/floaters/pain. Pt has been using PF and bromfenac  QID OS.       Last edited by Elnor Avelina RAMAN, COT on 01/02/2024  1:20 PM.     Pt states two weeks ago she was moved into an assisted living so her drop schedule has been off. She feels the vision is the same.  Referring physician: Octavia Charlie Hamilton, MD [address] [place],  [place] [address]  HISTORICAL INFORMATION:  Selected notes from the MEDICAL RECORD NUMBER Referred by Dr. Hamilton Octavia for CME OS LEE:  Ocular Hx- PMH-   CURRENT MEDICATIONS: Current Outpatient Medications (Ophthalmic Drugs)  Medication Sig   Bromfenac  Sodium 0.07 % SOLN Place 1 drop into the left eye 4 (four) times daily.   dorzolamide-timolol (COSOPT) 2-0.5 % ophthalmic solution 1 drop 2 (two) times daily.   latanoprost  (XALATAN ) 0.005 % ophthalmic solution Place 1 drop into both eyes at bedtime. (Patient not taking: Reported on 12/20/2023)   prednisoLONE  acetate (PRED FORTE ) 1 % ophthalmic suspension Place 1 drop into the left eye 4 (four) times daily.   No current facility-administered medications for this visit. (Ophthalmic Drugs)   Current Outpatient Medications (Other)  Medication Sig   acetaminophen  (TYLENOL ) 325 MG tablet Take 650 mg by mouth every 6 (six) hours as needed for mild pain.   acetaminophen  (TYLENOL ) 500 MG tablet Take 1,000 mg by mouth as needed.   cholecalciferol  (VITAMIN D ) 1000 UNITS tablet Take 1,000 Units by mouth daily.   EPINEPHrine  (EPIPEN  2-PAK) 0.3 mg/0.3 mL IJ SOAJ injection Inject 0.3 mg into the muscle  as needed for anaphylaxis.   fluticasone (FLONASE) 50 MCG/ACT nasal spray Place 2 sprays into both nostrils daily.   gabapentin  (NEURONTIN ) 100 MG capsule Take 1 capsule (100 mg total) by mouth at bedtime.   rosuvastatin  (CRESTOR ) 5 MG tablet Take 1 tablet (5 mg total) by mouth 3 (three) times a week.   senna (SENOKOT) 8.6 MG TABS tablet Take 1 tablet by mouth daily as needed for mild constipation.   tuberculin (TUBERSOL) 5 UNIT/0.1ML injection Inject 0.1 mLs into the skin daily.   No current facility-administered medications for this visit. (Other)   REVIEW OF SYSTEMS: ROS   Positive for: Cardiovascular, Eyes Last edited by Elnor Avelina RAMAN, COT on 01/02/2024  1:17 PM.      ALLERGIES Allergies  Allergen Reactions   Brandy [Alcohol] Anaphylaxis    Related to grapes   Prune Anaphylaxis    PLUMS,GRAPES,RAISINS   Aspirin  Other (See Comments)    Stomach bleed   Augmentin [Amoxicillin-Pot Clavulanate] Hives and Itching   Clindamycin Other (See Comments)    unkown   Omnicef [Cefdinir] Hives   Vinegar [Acetic Acid]     basalmic   Alprazolam Rash   Ceftin [Cefuroxime Axetil] Rash   Doxycycline  Rash   Eryc [Erythromycin] Rash   Penicillins Rash   PAST MEDICAL HISTORY Past Medical History:  Diagnosis Date   Allergic rhinitis due to  pollen    Anxiety state, unspecified    Arthritis    RIGHT LEG, HANDS.   Cancer (HCC)    FACE/NOSE -SKIN-squamous cell and basal cell.   Disorder of bone and cartilage, unspecified    Lumbago    Lyme borreliosis    Other atopic dermatitis and related conditions    Other dysphagia    Reflux esophagitis    Senile osteoporosis    TMJ dysfunction    MORE ON LEFT WEARS BITE PROTECTION AT NIGHT   Past Surgical History:  Procedure Laterality Date   APPENDECTOMY  1950   CATARACT EXTRACTION, BILATERAL Bilateral    EYE SURGERY Right    right eye ptrigium excision x2   HERNIA REPAIR Bilateral    TOTAL HIP ARTHROPLASTY Right 12/10/2015   Procedure:  RIGHT TOTAL HIP ARTHROPLASTY ANTERIOR APPROACH;  Surgeon: Lonni CINDERELLA Poli, MD;  Location: [facility];  Service: Orthopedics;  Laterality: Right;  Spinal to General   FAMILY HISTORY History reviewed. No pertinent family history. SOCIAL HISTORY Social History   Tobacco Use   Smoking status: Never    Passive exposure: Past   Smokeless tobacco: Never   Tobacco comments:    past seconday smoke from husband  Vaping Use   Vaping status: Never Used  Substance Use Topics   Alcohol use: Not Currently    Alcohol/week: 0.0 standard drinks of alcohol   Drug use: No       OPHTHALMIC EXAM:  Base Eye Exam     Visual Acuity (Snellen - Linear)       Right Left   Dist cc 20/25 -2 20/50 +2   Dist ph cc NI NI    Correction: Glasses         Tonometry (Tonopen, 1:14 PM)       Right Left   Pressure 12 18         Pupils       Pupils Dark Light Shape React APD   Right PERRL 3 2 Round Brisk None   Left PERRL 3 2 Round Brisk None         Visual Fields       Left Right    Full Full         Extraocular Movement       Right Left    Full, Ortho Full, Ortho         Neuro/Psych     Oriented x3: Yes   Mood/Affect: Normal         Dilation     Both eyes: 1.0% Mydriacyl, 2.5% Phenylephrine @ 1:15 PM           Slit Lamp and Fundus Exam     Slit Lamp Exam       Right Left   Lids/Lashes Dermatochalasis - upper lid Dermatochalasis - upper lid   Conjunctiva/Sclera White and quiet White and quiet   Cornea arcus, 1+ Punctate epithelial erosions, trace tear film debris arcus, 1-2+ fine Punctate epithelial erosions, trace tear film debris   Anterior Chamber deep and clear deep and clear, no cell or flare   Iris Round and moderately dilated Round and moderately dilated   Lens PC IOL in good position 3 piece PC IOL in good position   Anterior Vitreous syneresis Mild syneresis         Fundus Exam       Right Left   Disc Pink and Sharp Pink and Sharp, mild  peripapillary drusen   C/D Ratio  0.3 0.2   Macula Flat, Blunted foveal reflex, RPE mottling, mild drusen nasal mac Flat, Blunted foveal reflex, central edema / CME, no heme   Vessels attenuated, Tortuous attenuated, Tortuous   Periphery Attached, midzonal drusen, No heme Attached, midzonal drusen, No heme           IMAGING AND PROCEDURES  Imaging and Procedures for 01/02/2024  OCT, Retina - OU - Both Eyes       Right Eye Quality was good. Central Foveal Thickness: 284. Progression has been stable. Findings include normal foveal contour, no IRF, no SRF.   Left Eye Quality was good. Central Foveal Thickness: 443. Progression has improved. Findings include abnormal foveal contour, intraretinal fluid, subretinal fluid (Central edema with +IRF / SRF-- slightly improved).   Notes *Images captured and stored on drive  Diagnosis / Impression:  OD: NFP, no IRF/SRF OS: Central edema with +IRF / SRF  Clinical management:  See below  Abbreviations: NFP - Normal foveal profile. CME - cystoid macular edema. PED - pigment epithelial detachment. IRF - intraretinal fluid. SRF - subretinal fluid. EZ - ellipsoid zone. ERM - epiretinal membrane. ORA - outer retinal atrophy. ORT - outer retinal tubulation. SRHM - subretinal hyper-reflective material. IRHM - intraretinal hyper-reflective material      Injection into Tenon's Capsule - OS - Left Eye       Time Out 01/02/2024. 1:36 PM. Confirmed correct patient, procedure, site, and patient consented.   Anesthesia Anesthetic medications included Proparacaine 0.5%.   Procedure Preparation included 5% betadine to ocular surface.   Injection: 4 mg triamcinolone  acetonide 40 MG/ML   Route: Intravitreal, Site: Left Eye   NDC: [phone], Waste: 0.9 mL            ASSESSMENT/PLAN:   ICD-10-CM   1. CME (cystoid macular edema), left  H35.352 OCT, Retina - OU - Both Eyes    Injection into Tenon's Capsule - OS - Left Eye    2. Intermediate  stage nonexudative age-related macular degeneration of both eyes  H35.3132     3. Pseudophakia, both eyes  Z96.1       1. CME OS  - incidental finding on routine exam by Dr. Octavia  - pt reports vision decreased OS for several weeks  - OCT shows central CME with +IRF/SRF - FA 08.04.25 shows OS Perifoveal petaloid hyperfluorescent leakage, hyperfluorescent staining of disc -- consistent w/ CME  - discussed findings, prognosis and treatment options  - unclear etiology, but will start PF and Bromfenac  QID OS  - also recommend stopping Latanoprost  in both eyes for now  - recommend STK OS #1 today 09.03.25  - STK informed consent signed and obtained 09.03.25  - f/u 6 weeks -- DFE/OCT  2. Age related macular degeneration, non-exudative, both eyes - The incidence, anatomy, and pathology of dry AMD, risk of progression, and the AREDS and AREDS 2 study including smoking risks discussed with patient.  - Recommend amsler grid monitoring  - monitor  3. Pseudophakia OU  - s/p CE/IOL OU (Dr. Lamar Octavia)  - IOLs in good position, doing well  - monitor   Ophthalmic Meds Ordered this visit:  No orders of the defined types were placed in this encounter.    Return in about 6 weeks (around 02/13/2024) for f/u CME OS , DFE, OCT.  There are no Patient Instructions on file for this visit.  Explained the diagnoses, plan, and follow up with the patient and they expressed understanding.  Patient expressed understanding  of the importance of proper follow up care.   This document serves as a record of services personally performed by Redell JUDITHANN Hans, MD, PhD. It was created on their behalf by Almetta Pesa, an ophthalmic technician. The creation of this record is the provider's dictation and/or activities during the visit.    Electronically signed by: Almetta Pesa, OA, 01/02/24  1:46 PM  This document serves as a record of services personally performed by Redell JUDITHANN Hans, MD, PhD. It was  created on their behalf by Wanda GEANNIE Keens, COT an ophthalmic technician. The creation of this record is the provider's dictation and/or activities during the visit.    Electronically signed by:  Wanda GEANNIE Keens, COT  01/02/24 1:46 PM  Redell JUDITHANN Hans, M.D., Ph.D. Diseases & Surgery of the Retina and Vitreous [facility] 01/02/2024    Abbreviations: M myopia (nearsighted); A astigmatism; H hyperopia (farsighted); P presbyopia; Mrx spectacle prescription;  CTL contact lenses; OD right eye; OS left eye; OU both eyes  XT exotropia; ET esotropia; PEK punctate epithelial keratitis; PEE punctate epithelial erosions; DES dry eye syndrome; MGD meibomian gland dysfunction; ATs artificial tears; PFAT's preservative free artificial tears; NSC nuclear sclerotic cataract; PSC posterior subcapsular cataract; ERM epi-retinal membrane; PVD posterior vitreous detachment; RD retinal detachment; DM diabetes mellitus; DR diabetic retinopathy; NPDR non-proliferative diabetic retinopathy; PDR proliferative diabetic retinopathy; CSME clinically significant macular edema; DME diabetic macular edema; dbh dot blot hemorrhages; CWS cotton wool spot; POAG primary open angle glaucoma; C/D cup-to-disc ratio; HVF humphrey visual field; GVF goldmann visual field; OCT optical coherence tomography; IOP intraocular pressure; BRVO Branch retinal vein occlusion; CRVO central retinal vein occlusion; CRAO central retinal artery occlusion; BRAO branch retinal artery occlusion; RT retinal tear; SB scleral buckle; PPV pars plana vitrectomy; VH Vitreous hemorrhage; PRP panretinal laser photocoagulation; IVK intravitreal kenalog ; VMT vitreomacular traction; MH Macular hole;  NVD neovascularization of the disc; NVE neovascularization elsewhere; AREDS age related eye disease study; ARMD age related macular degeneration; POAG primary open angle glaucoma; EBMD epithelial/anterior basement membrane dystrophy; ACIOL anterior  chamber intraocular lens; IOL intraocular lens; PCIOL posterior chamber intraocular lens; Phaco/IOL phacoemulsification with intraocular lens placement; PRK photorefractive keratectomy; LASIK laser assisted in situ keratomileusis; HTN hypertension; DM diabetes mellitus; COPD chronic obstructive pulmonary disease

## 2024-01-01 DIAGNOSIS — R41841 Cognitive communication deficit: Secondary | ICD-10-CM | POA: Diagnosis not present

## 2024-01-01 DIAGNOSIS — M6281 Muscle weakness (generalized): Secondary | ICD-10-CM | POA: Diagnosis not present

## 2024-01-01 DIAGNOSIS — M25551 Pain in right hip: Secondary | ICD-10-CM | POA: Diagnosis not present

## 2024-01-01 DIAGNOSIS — M25561 Pain in right knee: Secondary | ICD-10-CM | POA: Diagnosis not present

## 2024-01-02 ENCOUNTER — Ambulatory Visit (INDEPENDENT_AMBULATORY_CARE_PROVIDER_SITE_OTHER): Admitting: Ophthalmology

## 2024-01-02 ENCOUNTER — Encounter (INDEPENDENT_AMBULATORY_CARE_PROVIDER_SITE_OTHER): Payer: Self-pay | Admitting: Ophthalmology

## 2024-01-02 DIAGNOSIS — R41841 Cognitive communication deficit: Secondary | ICD-10-CM | POA: Diagnosis not present

## 2024-01-02 DIAGNOSIS — H353132 Nonexudative age-related macular degeneration, bilateral, intermediate dry stage: Secondary | ICD-10-CM | POA: Diagnosis not present

## 2024-01-02 DIAGNOSIS — M6281 Muscle weakness (generalized): Secondary | ICD-10-CM | POA: Diagnosis not present

## 2024-01-02 DIAGNOSIS — M25551 Pain in right hip: Secondary | ICD-10-CM | POA: Diagnosis not present

## 2024-01-02 DIAGNOSIS — M25561 Pain in right knee: Secondary | ICD-10-CM | POA: Diagnosis not present

## 2024-01-02 DIAGNOSIS — H35352 Cystoid macular degeneration, left eye: Secondary | ICD-10-CM | POA: Diagnosis not present

## 2024-01-02 DIAGNOSIS — Z961 Presence of intraocular lens: Secondary | ICD-10-CM

## 2024-01-02 MED ORDER — PREDNISOLONE ACETATE 1 % OP SUSP: 1.0000 [drp] | Freq: Four times a day (QID) | OPHTHALMIC | 2 refills | Status: DC

## 2024-01-02 MED ORDER — BROMFENAC SODIUM 0.07 % OP SOLN: 1.0000 [drp] | Freq: Four times a day (QID) | OPHTHALMIC | 5 refills | Status: DC

## 2024-01-02 MED ORDER — TRIAMCINOLONE ACETONIDE 40 MG/ML IJ SUSP FOR KALEIDOSCOPE
40.0000 mg | INTRAMUSCULAR | Status: AC | PRN
  Administered 2024-01-02: 40 mg

## 2024-01-03 DIAGNOSIS — M6281 Muscle weakness (generalized): Secondary | ICD-10-CM | POA: Diagnosis not present

## 2024-01-03 DIAGNOSIS — M25551 Pain in right hip: Secondary | ICD-10-CM | POA: Diagnosis not present

## 2024-01-03 DIAGNOSIS — M25561 Pain in right knee: Secondary | ICD-10-CM | POA: Diagnosis not present

## 2024-01-03 DIAGNOSIS — R41841 Cognitive communication deficit: Secondary | ICD-10-CM | POA: Diagnosis not present

## 2024-01-04 DIAGNOSIS — M25561 Pain in right knee: Secondary | ICD-10-CM | POA: Diagnosis not present

## 2024-01-04 DIAGNOSIS — M25551 Pain in right hip: Secondary | ICD-10-CM | POA: Diagnosis not present

## 2024-01-04 DIAGNOSIS — M6281 Muscle weakness (generalized): Secondary | ICD-10-CM | POA: Diagnosis not present

## 2024-01-04 DIAGNOSIS — R41841 Cognitive communication deficit: Secondary | ICD-10-CM | POA: Diagnosis not present

## 2024-01-08 DIAGNOSIS — R41841 Cognitive communication deficit: Secondary | ICD-10-CM | POA: Diagnosis not present

## 2024-01-08 DIAGNOSIS — M25561 Pain in right knee: Secondary | ICD-10-CM | POA: Diagnosis not present

## 2024-01-08 DIAGNOSIS — M25551 Pain in right hip: Secondary | ICD-10-CM | POA: Diagnosis not present

## 2024-01-08 DIAGNOSIS — M6281 Muscle weakness (generalized): Secondary | ICD-10-CM | POA: Diagnosis not present

## 2024-01-10 DIAGNOSIS — M25551 Pain in right hip: Secondary | ICD-10-CM | POA: Diagnosis not present

## 2024-01-10 DIAGNOSIS — M6281 Muscle weakness (generalized): Secondary | ICD-10-CM | POA: Diagnosis not present

## 2024-01-10 DIAGNOSIS — M25561 Pain in right knee: Secondary | ICD-10-CM | POA: Diagnosis not present

## 2024-01-10 DIAGNOSIS — R41841 Cognitive communication deficit: Secondary | ICD-10-CM | POA: Diagnosis not present

## 2024-01-14 DIAGNOSIS — M6281 Muscle weakness (generalized): Secondary | ICD-10-CM | POA: Diagnosis not present

## 2024-01-14 DIAGNOSIS — R41841 Cognitive communication deficit: Secondary | ICD-10-CM | POA: Diagnosis not present

## 2024-01-14 DIAGNOSIS — M25561 Pain in right knee: Secondary | ICD-10-CM | POA: Diagnosis not present

## 2024-01-14 DIAGNOSIS — M25551 Pain in right hip: Secondary | ICD-10-CM | POA: Diagnosis not present

## 2024-01-15 DIAGNOSIS — M6281 Muscle weakness (generalized): Secondary | ICD-10-CM | POA: Diagnosis not present

## 2024-01-15 DIAGNOSIS — M25551 Pain in right hip: Secondary | ICD-10-CM | POA: Diagnosis not present

## 2024-01-15 DIAGNOSIS — R41841 Cognitive communication deficit: Secondary | ICD-10-CM | POA: Diagnosis not present

## 2024-01-15 DIAGNOSIS — M25561 Pain in right knee: Secondary | ICD-10-CM | POA: Diagnosis not present

## 2024-01-16 DIAGNOSIS — R41841 Cognitive communication deficit: Secondary | ICD-10-CM | POA: Diagnosis not present

## 2024-01-16 DIAGNOSIS — M25551 Pain in right hip: Secondary | ICD-10-CM | POA: Diagnosis not present

## 2024-01-16 DIAGNOSIS — M6281 Muscle weakness (generalized): Secondary | ICD-10-CM | POA: Diagnosis not present

## 2024-01-16 DIAGNOSIS — M25561 Pain in right knee: Secondary | ICD-10-CM | POA: Diagnosis not present

## 2024-01-17 DIAGNOSIS — R41841 Cognitive communication deficit: Secondary | ICD-10-CM | POA: Diagnosis not present

## 2024-01-17 DIAGNOSIS — M25551 Pain in right hip: Secondary | ICD-10-CM | POA: Diagnosis not present

## 2024-01-17 DIAGNOSIS — M25561 Pain in right knee: Secondary | ICD-10-CM | POA: Diagnosis not present

## 2024-01-17 DIAGNOSIS — M6281 Muscle weakness (generalized): Secondary | ICD-10-CM | POA: Diagnosis not present

## 2024-01-18 ENCOUNTER — Non-Acute Institutional Stay: Admitting: Orthopedic Surgery

## 2024-01-18 ENCOUNTER — Encounter: Payer: Self-pay | Admitting: Orthopedic Surgery

## 2024-01-18 DIAGNOSIS — M25561 Pain in right knee: Secondary | ICD-10-CM | POA: Diagnosis not present

## 2024-01-18 DIAGNOSIS — Z0000 Encounter for general adult medical examination without abnormal findings: Secondary | ICD-10-CM

## 2024-01-18 DIAGNOSIS — R41841 Cognitive communication deficit: Secondary | ICD-10-CM | POA: Diagnosis not present

## 2024-01-18 DIAGNOSIS — M6281 Muscle weakness (generalized): Secondary | ICD-10-CM | POA: Diagnosis not present

## 2024-01-18 DIAGNOSIS — M25551 Pain in right hip: Secondary | ICD-10-CM | POA: Diagnosis not present
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
## 2024-01-21 DIAGNOSIS — R41841 Cognitive communication deficit: Secondary | ICD-10-CM | POA: Diagnosis not present

## 2024-01-21 DIAGNOSIS — M6281 Muscle weakness (generalized): Secondary | ICD-10-CM | POA: Diagnosis not present

## 2024-01-21 DIAGNOSIS — M25551 Pain in right hip: Secondary | ICD-10-CM | POA: Diagnosis not present

## 2024-01-21 DIAGNOSIS — M25561 Pain in right knee: Secondary | ICD-10-CM | POA: Diagnosis not present

## 2024-01-22 DIAGNOSIS — M25561 Pain in right knee: Secondary | ICD-10-CM | POA: Diagnosis not present

## 2024-01-22 DIAGNOSIS — R41841 Cognitive communication deficit: Secondary | ICD-10-CM | POA: Diagnosis not present

## 2024-01-22 DIAGNOSIS — M25551 Pain in right hip: Secondary | ICD-10-CM | POA: Diagnosis not present

## 2024-01-22 DIAGNOSIS — M6281 Muscle weakness (generalized): Secondary | ICD-10-CM | POA: Diagnosis not present

## 2024-01-23 DIAGNOSIS — M25561 Pain in right knee: Secondary | ICD-10-CM | POA: Diagnosis not present

## 2024-01-23 DIAGNOSIS — M25551 Pain in right hip: Secondary | ICD-10-CM | POA: Diagnosis not present

## 2024-01-23 DIAGNOSIS — R41841 Cognitive communication deficit: Secondary | ICD-10-CM | POA: Diagnosis not present

## 2024-01-23 DIAGNOSIS — M6281 Muscle weakness (generalized): Secondary | ICD-10-CM | POA: Diagnosis not present

## 2024-01-24 DIAGNOSIS — R41841 Cognitive communication deficit: Secondary | ICD-10-CM | POA: Diagnosis not present

## 2024-01-24 DIAGNOSIS — M25551 Pain in right hip: Secondary | ICD-10-CM | POA: Diagnosis not present

## 2024-01-24 DIAGNOSIS — M25561 Pain in right knee: Secondary | ICD-10-CM | POA: Diagnosis not present

## 2024-01-24 DIAGNOSIS — M6281 Muscle weakness (generalized): Secondary | ICD-10-CM | POA: Diagnosis not present

## 2024-01-25 DIAGNOSIS — R41841 Cognitive communication deficit: Secondary | ICD-10-CM | POA: Diagnosis not present

## 2024-01-25 DIAGNOSIS — M6281 Muscle weakness (generalized): Secondary | ICD-10-CM | POA: Diagnosis not present

## 2024-01-25 DIAGNOSIS — M25561 Pain in right knee: Secondary | ICD-10-CM | POA: Diagnosis not present

## 2024-01-25 DIAGNOSIS — M25551 Pain in right hip: Secondary | ICD-10-CM | POA: Diagnosis not present

## 2024-01-28 DIAGNOSIS — R41841 Cognitive communication deficit: Secondary | ICD-10-CM | POA: Diagnosis not present

## 2024-01-28 DIAGNOSIS — M6281 Muscle weakness (generalized): Secondary | ICD-10-CM | POA: Diagnosis not present

## 2024-01-28 DIAGNOSIS — I10 Essential (primary) hypertension: Secondary | ICD-10-CM | POA: Diagnosis not present

## 2024-01-28 DIAGNOSIS — F01B0 Vascular dementia, moderate, without behavioral disturbance, psychotic disturbance, mood disturbance, and anxiety: Secondary | ICD-10-CM | POA: Diagnosis not present

## 2024-01-28 DIAGNOSIS — M25551 Pain in right hip: Secondary | ICD-10-CM | POA: Diagnosis not present

## 2024-01-28 DIAGNOSIS — M25561 Pain in right knee: Secondary | ICD-10-CM | POA: Diagnosis not present

## 2024-01-29 DIAGNOSIS — M25551 Pain in right hip: Secondary | ICD-10-CM | POA: Diagnosis not present

## 2024-01-29 DIAGNOSIS — M25561 Pain in right knee: Secondary | ICD-10-CM | POA: Diagnosis not present

## 2024-01-29 DIAGNOSIS — R41841 Cognitive communication deficit: Secondary | ICD-10-CM | POA: Diagnosis not present

## 2024-01-29 DIAGNOSIS — M6281 Muscle weakness (generalized): Secondary | ICD-10-CM | POA: Diagnosis not present

## 2024-01-30 DIAGNOSIS — M25561 Pain in right knee: Secondary | ICD-10-CM | POA: Diagnosis not present

## 2024-01-30 DIAGNOSIS — R41841 Cognitive communication deficit: Secondary | ICD-10-CM | POA: Diagnosis not present

## 2024-01-30 DIAGNOSIS — M6281 Muscle weakness (generalized): Secondary | ICD-10-CM | POA: Diagnosis not present

## 2024-01-30 DIAGNOSIS — M25551 Pain in right hip: Secondary | ICD-10-CM | POA: Diagnosis not present

## 2024-01-31 ENCOUNTER — Other Ambulatory Visit

## 2024-01-31 DIAGNOSIS — M25551 Pain in right hip: Secondary | ICD-10-CM | POA: Diagnosis not present

## 2024-01-31 DIAGNOSIS — M25561 Pain in right knee: Secondary | ICD-10-CM | POA: Diagnosis not present

## 2024-01-31 DIAGNOSIS — R41841 Cognitive communication deficit: Secondary | ICD-10-CM | POA: Diagnosis not present

## 2024-01-31 DIAGNOSIS — M6281 Muscle weakness (generalized): Secondary | ICD-10-CM | POA: Diagnosis not present

## 2024-02-04 DIAGNOSIS — R41841 Cognitive communication deficit: Secondary | ICD-10-CM | POA: Diagnosis not present

## 2024-02-04 DIAGNOSIS — M25551 Pain in right hip: Secondary | ICD-10-CM | POA: Diagnosis not present

## 2024-02-04 DIAGNOSIS — M6281 Muscle weakness (generalized): Secondary | ICD-10-CM | POA: Diagnosis not present

## 2024-02-04 DIAGNOSIS — M25561 Pain in right knee: Secondary | ICD-10-CM | POA: Diagnosis not present

## 2024-02-05 DIAGNOSIS — M6281 Muscle weakness (generalized): Secondary | ICD-10-CM | POA: Diagnosis not present

## 2024-02-05 DIAGNOSIS — M25551 Pain in right hip: Secondary | ICD-10-CM | POA: Diagnosis not present

## 2024-02-05 DIAGNOSIS — M25561 Pain in right knee: Secondary | ICD-10-CM | POA: Diagnosis not present

## 2024-02-05 DIAGNOSIS — R41841 Cognitive communication deficit: Secondary | ICD-10-CM | POA: Diagnosis not present

## 2024-02-06 ENCOUNTER — Encounter: Admitting: Internal Medicine

## 2024-02-06 DIAGNOSIS — M25561 Pain in right knee: Secondary | ICD-10-CM | POA: Diagnosis not present

## 2024-02-06 DIAGNOSIS — M25551 Pain in right hip: Secondary | ICD-10-CM | POA: Diagnosis not present

## 2024-02-06 DIAGNOSIS — M6281 Muscle weakness (generalized): Secondary | ICD-10-CM | POA: Diagnosis not present

## 2024-02-06 DIAGNOSIS — R41841 Cognitive communication deficit: Secondary | ICD-10-CM | POA: Diagnosis not present

## 2024-02-06 NOTE — Progress Notes (Signed)
 [facility] - Clinic Note  02/13/2024   CHIEF COMPLAINT Patient presents for Retina Follow Up  HISTORY OF PRESENT ILLNESS: Rachel Morris is a 89 y.o. female who presents to the clinic today for:  HPI     Retina Follow Up   Patient presents with  Other.  In left eye.  This started 6 weeks ago.  I, the attending physician,  performed the HPI with the patient and updated documentation appropriately.        Comments   Patient here for 6 weeks retina follow up for CME OS. Patient states vision OS giving a lot of trouble. Vision blurred. Eye hurts like an ache. Aware of it. OD is fine. Uses drops.      Last edited by Valdemar Rogue, MD on 02/17/2024  9:56 PM.    Pt states she's doing well, having issues with right eye today-probably dilation. Nothing new health wise.   Referring physician: Octavia Charlie Hamilton, MD [address] [place],  [place] [address]  HISTORICAL INFORMATION:  Selected notes from the MEDICAL RECORD NUMBER Referred by Dr. Hamilton Octavia for CME OS LEE:  Ocular Hx- PMH-   CURRENT MEDICATIONS: Current Outpatient Medications (Ophthalmic Drugs)  Medication Sig   Bromfenac  Sodium 0.07 % SOLN Place 1 drop into the left eye 4 (four) times daily.   dorzolamide-timolol (COSOPT) 2-0.5 % ophthalmic solution 1 drop 2 (two) times daily.   prednisoLONE  acetate (PRED FORTE ) 1 % ophthalmic suspension Place 1 drop into the left eye 4 (four) times daily.   No current facility-administered medications for this visit. (Ophthalmic Drugs)   Current Outpatient Medications (Other)  Medication Sig   acetaminophen  (TYLENOL ) 500 MG tablet Take 1,000 mg by mouth 2 (two) times daily as needed.   cholecalciferol  (VITAMIN D ) 1000 UNITS tablet Take 1,000 Units by mouth daily.   EPINEPHrine  (EPIPEN  2-PAK) 0.3 mg/0.3 mL IJ SOAJ injection Inject 0.3 mg into the muscle as needed for anaphylaxis.   fluticasone (FLONASE) 50 MCG/ACT nasal spray Place 2 sprays into both  nostrils daily.   gabapentin  (NEURONTIN ) 100 MG capsule Take 1 capsule (100 mg total) by mouth at bedtime.   rosuvastatin  (CRESTOR ) 5 MG tablet Take 1 tablet (5 mg total) by mouth 3 (three) times a week.   senna (SENOKOT) 8.6 MG TABS tablet Take 1 tablet by mouth daily as needed for mild constipation.   tuberculin (TUBERSOL) 5 UNIT/0.1ML injection Inject 0.1 mLs into the skin daily.   No current facility-administered medications for this visit. (Other)   REVIEW OF SYSTEMS: ROS   Positive for: Cardiovascular, Eyes Last edited by Orval Asberry RAMAN, COA on 02/13/2024  1:23 PM.       ALLERGIES Allergies  Allergen Reactions   Brandy [Alcohol] Anaphylaxis    Related to grapes   Prune Anaphylaxis    PLUMS,GRAPES,RAISINS   Aspirin  Other (See Comments)    Stomach bleed   Augmentin [Amoxicillin-Pot Clavulanate] Hives and Itching   Clindamycin Other (See Comments)    unkown   Omnicef [Cefdinir] Hives   Vinegar [Acetic Acid]     basalmic   Alprazolam Rash   Ceftin [Cefuroxime Axetil] Rash   Doxycycline  Rash   Eryc [Erythromycin] Rash   Penicillins Rash   PAST MEDICAL HISTORY Past Medical History:  Diagnosis Date   Allergic rhinitis due to pollen    Anxiety state, unspecified    Arthritis    RIGHT LEG, HANDS.   Cancer (HCC)  FACE/NOSE -SKIN-squamous cell and basal cell.   Disorder of bone and cartilage, unspecified    Lumbago    Lyme borreliosis    Other atopic dermatitis and related conditions    Other dysphagia    Reflux esophagitis    Senile osteoporosis    TMJ dysfunction    MORE ON LEFT WEARS BITE PROTECTION AT NIGHT   Past Surgical History:  Procedure Laterality Date   APPENDECTOMY  1950   CATARACT EXTRACTION, BILATERAL Bilateral    EYE SURGERY Right    right eye ptrigium excision x2   HERNIA REPAIR Bilateral    TOTAL HIP ARTHROPLASTY Right 12/10/2015   Procedure: RIGHT TOTAL HIP ARTHROPLASTY ANTERIOR APPROACH;  Surgeon: Lonni CINDERELLA Poli, MD;   Location: [facility];  Service: Orthopedics;  Laterality: Right;  Spinal to General   FAMILY HISTORY History reviewed. No pertinent family history. SOCIAL HISTORY Social History   Tobacco Use   Smoking status: Never    Passive exposure: Past   Smokeless tobacco: Never   Tobacco comments:    past seconday smoke from husband  Vaping Use   Vaping status: Never Used  Substance Use Topics   Alcohol use: Not Currently    Alcohol/week: 0.0 standard drinks of alcohol   Drug use: No       OPHTHALMIC EXAM:  Base Eye Exam     Visual Acuity (Snellen - Linear)       Right Left   Dist cc 20/25 20/40 -2    Correction: Glasses         Tonometry (Tonopen, 1:21 PM)       Right Left   Pressure 16 19         Pupils       Dark Light Shape React APD   Right 3 2 Round Brisk None   Left 3 2 Round Brisk None         Visual Fields (Counting fingers)       Left Right    Full Full         Extraocular Movement       Right Left    Full, Ortho Full, Ortho         Neuro/Psych     Oriented x3: Yes   Mood/Affect: Normal         Dilation     Both eyes: 1.0% Mydriacyl, 2.5% Phenylephrine @ 1:21 PM           Slit Lamp and Fundus Exam     Slit Lamp Exam       Right Left   Lids/Lashes Dermatochalasis - upper lid Dermatochalasis - upper lid   Conjunctiva/Sclera White and quiet STK ST quad   Cornea arcus, 1+ Punctate epithelial erosions, trace tear film debris, mild EBMD arcus, 1+ fine Punctate epithelial erosions, trace tear film debris   Anterior Chamber deep and clear deep and clear, no cell or flare   Iris Round and moderately dilated Round and moderately dilated   Lens PC IOL in good position 3 piece PC IOL in good position   Anterior Vitreous syneresis Mild syneresis         Fundus Exam       Right Left   Disc Pink and Sharp Pink and Sharp, mild peripapillary drusen   C/D Ratio 0.3 0.2   Macula Flat, Blunted foveal reflex, RPE mottling, mild drusen  nasal mac Flat, Blunted foveal reflex, central edema / CME- resolved, no heme   Vessels attenuated, Tortuous  attenuated, Tortuous   Periphery Attached, midzonal drusen, No heme Attached, midzonal drusen, No heme           Refraction     Wearing Rx       Sphere Cylinder Axis Add   Right -2.75 +0.75 009 +2.75   Left -1.75 +1.75 170 +2.75           IMAGING AND PROCEDURES  Imaging and Procedures for 02/13/2024  OCT, Retina - OU - Both Eyes       Right Eye Quality was good. Central Foveal Thickness: 283. Progression has been stable. Findings include normal foveal contour, no IRF, no SRF.   Left Eye Quality was good. Central Foveal Thickness: 283. Progression has improved. Findings include no IRF, no SRF, abnormal foveal contour (Interval resolution of central edema/IRF/SRF).   Notes *Images captured and stored on drive  Diagnosis / Impression:  OD: NFP, no IRF/SRF OS: Interval resolution of central edema/IRF/SRF  Clinical management:  See below  Abbreviations: NFP - Normal foveal profile. CME - cystoid macular edema. PED - pigment epithelial detachment. IRF - intraretinal fluid. SRF - subretinal fluid. EZ - ellipsoid zone. ERM - epiretinal membrane. ORA - outer retinal atrophy. ORT - outer retinal tubulation. SRHM - subretinal hyper-reflective material. IRHM - intraretinal hyper-reflective material            ASSESSMENT/PLAN:   ICD-10-CM   1. CME (cystoid macular edema), left  H35.352 OCT, Retina - OU - Both Eyes    2. Intermediate stage nonexudative age-related macular degeneration of both eyes  H35.3132     3. Pseudophakia, both eyes  Z96.1      1. CME OS  - incidental finding on routine exam by Dr. Octavia  - pt reported vision decreased OS for several weeks  - FA 08.04.25 shows OS Perifoveal petaloid hyperfluorescent leakage, hyperfluorescent staining of disc -- consistent w/ CME - started PF and Bromfenac  QID OS on 08.04.25 -- questionable compliance  -- pt has since moved into assisted living - s/p STK OS #1 (09.03.25) - stopped latanoprost  in both eyes; improved compliance w/ PF and Bromfenac  - OCT shows Interval resolution of central edema/IRF/SRF   - Decrease PF and Bromfenac  to TID as of 10.15.25  - f/u 6 weeks -- DFE/OCT  2. Age related macular degeneration, non-exudative, both eyes - The incidence, anatomy, and pathology of dry AMD, risk of progression, and the AREDS and AREDS 2 study including smoking risks discussed with patient.  - Recommend amsler grid monitoring  - monitor  3. Pseudophakia OU  - s/p CE/IOL OU (Dr. Lamar Octavia)  - IOLs in good position, doing well  - monitor   Ophthalmic Meds Ordered this visit:  No orders of the defined types were placed in this encounter.    Return in about 6 weeks (around 03/26/2024) for CME OS, DFE, OCT.  There are no Patient Instructions on file for this visit.  Explained the diagnoses, plan, and follow up with the patient and they expressed understanding.  Patient expressed understanding of the importance of proper follow up care.   This document serves as a record of services personally performed by Redell JUDITHANN Hans, MD, PhD. It was created on their behalf by Almetta Pesa, an ophthalmic technician. The creation of this record is the provider's dictation and/or activities during the visit.    Electronically signed by: Almetta Pesa, OA, 02/17/24  10:05 PM  Redell JUDITHANN Hans, M.D., Ph.D. Diseases & Surgery of the Retina and Vitreous  [facility] 02/13/2024  I have reviewed the above documentation for accuracy and completeness, and I agree with the above. Redell JUDITHANN Hans, M.D., Ph.D. 02/17/24 10:07 PM   Abbreviations: M myopia (nearsighted); A astigmatism; H hyperopia (farsighted); P presbyopia; Mrx spectacle prescription;  CTL contact lenses; OD right eye; OS left eye; OU both eyes  XT exotropia; ET esotropia; PEK punctate epithelial keratitis; PEE  punctate epithelial erosions; DES dry eye syndrome; MGD meibomian gland dysfunction; ATs artificial tears; PFAT's preservative free artificial tears; NSC nuclear sclerotic cataract; PSC posterior subcapsular cataract; ERM epi-retinal membrane; PVD posterior vitreous detachment; RD retinal detachment; DM diabetes mellitus; DR diabetic retinopathy; NPDR non-proliferative diabetic retinopathy; PDR proliferative diabetic retinopathy; CSME clinically significant macular edema; DME diabetic macular edema; dbh dot blot hemorrhages; CWS cotton wool spot; POAG primary open angle glaucoma; C/D cup-to-disc ratio; HVF humphrey visual field; GVF goldmann visual field; OCT optical coherence tomography; IOP intraocular pressure; BRVO Branch retinal vein occlusion; CRVO central retinal vein occlusion; CRAO central retinal artery occlusion; BRAO branch retinal artery occlusion; RT retinal tear; SB scleral buckle; PPV pars plana vitrectomy; VH Vitreous hemorrhage; PRP panretinal laser photocoagulation; IVK intravitreal kenalog ; VMT vitreomacular traction; MH Macular hole;  NVD neovascularization of the disc; NVE neovascularization elsewhere; AREDS age related eye disease study; ARMD age related macular degeneration; POAG primary open angle glaucoma; EBMD epithelial/anterior basement membrane dystrophy; ACIOL anterior chamber intraocular lens; IOL intraocular lens; PCIOL posterior chamber intraocular lens; Phaco/IOL phacoemulsification with intraocular lens placement; PRK photorefractive keratectomy; LASIK laser assisted in situ keratomileusis; HTN hypertension; DM diabetes mellitus; COPD chronic obstructive pulmonary disease

## 2024-02-08 DIAGNOSIS — R41841 Cognitive communication deficit: Secondary | ICD-10-CM | POA: Diagnosis not present

## 2024-02-08 DIAGNOSIS — M25551 Pain in right hip: Secondary | ICD-10-CM | POA: Diagnosis not present

## 2024-02-08 DIAGNOSIS — M25561 Pain in right knee: Secondary | ICD-10-CM | POA: Diagnosis not present

## 2024-02-08 DIAGNOSIS — M6281 Muscle weakness (generalized): Secondary | ICD-10-CM | POA: Diagnosis not present

## 2024-02-11 DIAGNOSIS — M6281 Muscle weakness (generalized): Secondary | ICD-10-CM | POA: Diagnosis not present

## 2024-02-11 DIAGNOSIS — R41841 Cognitive communication deficit: Secondary | ICD-10-CM | POA: Diagnosis not present

## 2024-02-11 DIAGNOSIS — M25561 Pain in right knee: Secondary | ICD-10-CM | POA: Diagnosis not present

## 2024-02-11 DIAGNOSIS — M25551 Pain in right hip: Secondary | ICD-10-CM | POA: Diagnosis not present

## 2024-02-12 DIAGNOSIS — M25551 Pain in right hip: Secondary | ICD-10-CM | POA: Diagnosis not present

## 2024-02-12 DIAGNOSIS — M25561 Pain in right knee: Secondary | ICD-10-CM | POA: Diagnosis not present

## 2024-02-12 DIAGNOSIS — R41841 Cognitive communication deficit: Secondary | ICD-10-CM | POA: Diagnosis not present

## 2024-02-12 DIAGNOSIS — M6281 Muscle weakness (generalized): Secondary | ICD-10-CM | POA: Diagnosis not present

## 2024-02-13 ENCOUNTER — Ambulatory Visit: Admitting: General Practice

## 2024-02-13 ENCOUNTER — Ambulatory Visit (INDEPENDENT_AMBULATORY_CARE_PROVIDER_SITE_OTHER): Admitting: Ophthalmology

## 2024-02-13 ENCOUNTER — Encounter (INDEPENDENT_AMBULATORY_CARE_PROVIDER_SITE_OTHER): Payer: Self-pay | Admitting: Ophthalmology

## 2024-02-13 DIAGNOSIS — M25551 Pain in right hip: Secondary | ICD-10-CM | POA: Diagnosis not present

## 2024-02-13 DIAGNOSIS — H35352 Cystoid macular degeneration, left eye: Secondary | ICD-10-CM

## 2024-02-13 DIAGNOSIS — Z961 Presence of intraocular lens: Secondary | ICD-10-CM | POA: Diagnosis not present

## 2024-02-13 DIAGNOSIS — M25561 Pain in right knee: Secondary | ICD-10-CM | POA: Diagnosis not present

## 2024-02-13 DIAGNOSIS — R41841 Cognitive communication deficit: Secondary | ICD-10-CM | POA: Diagnosis not present

## 2024-02-13 DIAGNOSIS — M6281 Muscle weakness (generalized): Secondary | ICD-10-CM | POA: Diagnosis not present

## 2024-02-13 DIAGNOSIS — H353132 Nonexudative age-related macular degeneration, bilateral, intermediate dry stage: Secondary | ICD-10-CM

## 2024-02-15 DIAGNOSIS — M25561 Pain in right knee: Secondary | ICD-10-CM | POA: Diagnosis not present

## 2024-02-15 DIAGNOSIS — R41841 Cognitive communication deficit: Secondary | ICD-10-CM | POA: Diagnosis not present

## 2024-02-15 DIAGNOSIS — M25551 Pain in right hip: Secondary | ICD-10-CM | POA: Diagnosis not present

## 2024-02-15 DIAGNOSIS — M6281 Muscle weakness (generalized): Secondary | ICD-10-CM | POA: Diagnosis not present

## 2024-02-17 ENCOUNTER — Encounter (INDEPENDENT_AMBULATORY_CARE_PROVIDER_SITE_OTHER): Payer: Self-pay | Admitting: Ophthalmology

## 2024-02-18 DIAGNOSIS — M6281 Muscle weakness (generalized): Secondary | ICD-10-CM | POA: Diagnosis not present

## 2024-02-18 DIAGNOSIS — M25551 Pain in right hip: Secondary | ICD-10-CM | POA: Diagnosis not present

## 2024-02-18 DIAGNOSIS — R41841 Cognitive communication deficit: Secondary | ICD-10-CM | POA: Diagnosis not present

## 2024-02-18 DIAGNOSIS — M25561 Pain in right knee: Secondary | ICD-10-CM | POA: Diagnosis not present

## 2024-02-19 DIAGNOSIS — R41841 Cognitive communication deficit: Secondary | ICD-10-CM | POA: Diagnosis not present

## 2024-02-19 DIAGNOSIS — M25551 Pain in right hip: Secondary | ICD-10-CM | POA: Diagnosis not present

## 2024-02-19 DIAGNOSIS — M6281 Muscle weakness (generalized): Secondary | ICD-10-CM | POA: Diagnosis not present

## 2024-02-19 DIAGNOSIS — M25561 Pain in right knee: Secondary | ICD-10-CM | POA: Diagnosis not present

## 2024-02-20 DIAGNOSIS — R41841 Cognitive communication deficit: Secondary | ICD-10-CM | POA: Diagnosis not present

## 2024-02-20 DIAGNOSIS — M25551 Pain in right hip: Secondary | ICD-10-CM | POA: Diagnosis not present

## 2024-02-20 DIAGNOSIS — M6281 Muscle weakness (generalized): Secondary | ICD-10-CM | POA: Diagnosis not present

## 2024-02-20 DIAGNOSIS — M25561 Pain in right knee: Secondary | ICD-10-CM | POA: Diagnosis not present

## 2024-02-21 DIAGNOSIS — M25561 Pain in right knee: Secondary | ICD-10-CM | POA: Diagnosis not present

## 2024-02-21 DIAGNOSIS — M6281 Muscle weakness (generalized): Secondary | ICD-10-CM | POA: Diagnosis not present

## 2024-02-21 DIAGNOSIS — M25551 Pain in right hip: Secondary | ICD-10-CM | POA: Diagnosis not present

## 2024-02-21 DIAGNOSIS — R41841 Cognitive communication deficit: Secondary | ICD-10-CM | POA: Diagnosis not present

## 2024-02-22 DIAGNOSIS — M6281 Muscle weakness (generalized): Secondary | ICD-10-CM | POA: Diagnosis not present

## 2024-02-22 DIAGNOSIS — R41841 Cognitive communication deficit: Secondary | ICD-10-CM | POA: Diagnosis not present

## 2024-02-22 DIAGNOSIS — M25561 Pain in right knee: Secondary | ICD-10-CM | POA: Diagnosis not present

## 2024-02-22 DIAGNOSIS — M25551 Pain in right hip: Secondary | ICD-10-CM | POA: Diagnosis not present

## 2024-02-25 DIAGNOSIS — M25561 Pain in right knee: Secondary | ICD-10-CM | POA: Diagnosis not present

## 2024-02-25 DIAGNOSIS — R41841 Cognitive communication deficit: Secondary | ICD-10-CM | POA: Diagnosis not present

## 2024-02-25 DIAGNOSIS — M25551 Pain in right hip: Secondary | ICD-10-CM | POA: Diagnosis not present

## 2024-02-25 DIAGNOSIS — M6281 Muscle weakness (generalized): Secondary | ICD-10-CM | POA: Diagnosis not present

## 2024-02-26 DIAGNOSIS — M25551 Pain in right hip: Secondary | ICD-10-CM | POA: Diagnosis not present

## 2024-02-26 DIAGNOSIS — R41841 Cognitive communication deficit: Secondary | ICD-10-CM | POA: Diagnosis not present

## 2024-02-26 DIAGNOSIS — M25561 Pain in right knee: Secondary | ICD-10-CM | POA: Diagnosis not present

## 2024-02-26 DIAGNOSIS — M6281 Muscle weakness (generalized): Secondary | ICD-10-CM | POA: Diagnosis not present

## 2024-02-27 DIAGNOSIS — M25561 Pain in right knee: Secondary | ICD-10-CM | POA: Diagnosis not present

## 2024-02-27 DIAGNOSIS — M6281 Muscle weakness (generalized): Secondary | ICD-10-CM | POA: Diagnosis not present

## 2024-02-27 DIAGNOSIS — M25551 Pain in right hip: Secondary | ICD-10-CM | POA: Diagnosis not present

## 2024-02-27 DIAGNOSIS — R41841 Cognitive communication deficit: Secondary | ICD-10-CM | POA: Diagnosis not present

## 2024-02-28 DIAGNOSIS — M6281 Muscle weakness (generalized): Secondary | ICD-10-CM | POA: Diagnosis not present

## 2024-02-28 DIAGNOSIS — M25561 Pain in right knee: Secondary | ICD-10-CM | POA: Diagnosis not present

## 2024-02-28 DIAGNOSIS — R41841 Cognitive communication deficit: Secondary | ICD-10-CM | POA: Diagnosis not present

## 2024-02-28 DIAGNOSIS — M25551 Pain in right hip: Secondary | ICD-10-CM | POA: Diagnosis not present

## 2024-02-29 DIAGNOSIS — M25561 Pain in right knee: Secondary | ICD-10-CM | POA: Diagnosis not present

## 2024-02-29 DIAGNOSIS — M6281 Muscle weakness (generalized): Secondary | ICD-10-CM | POA: Diagnosis not present

## 2024-02-29 DIAGNOSIS — M25551 Pain in right hip: Secondary | ICD-10-CM | POA: Diagnosis not present

## 2024-02-29 DIAGNOSIS — R41841 Cognitive communication deficit: Secondary | ICD-10-CM | POA: Diagnosis not present

## 2024-03-03 DIAGNOSIS — M6281 Muscle weakness (generalized): Secondary | ICD-10-CM | POA: Diagnosis not present

## 2024-03-03 DIAGNOSIS — M25551 Pain in right hip: Secondary | ICD-10-CM | POA: Diagnosis not present

## 2024-03-03 DIAGNOSIS — R41841 Cognitive communication deficit: Secondary | ICD-10-CM | POA: Diagnosis not present

## 2024-03-03 DIAGNOSIS — M25561 Pain in right knee: Secondary | ICD-10-CM | POA: Diagnosis not present

## 2024-03-05 DIAGNOSIS — M25561 Pain in right knee: Secondary | ICD-10-CM | POA: Diagnosis not present

## 2024-03-05 DIAGNOSIS — M25551 Pain in right hip: Secondary | ICD-10-CM | POA: Diagnosis not present

## 2024-03-05 DIAGNOSIS — M6281 Muscle weakness (generalized): Secondary | ICD-10-CM | POA: Diagnosis not present

## 2024-03-05 DIAGNOSIS — R41841 Cognitive communication deficit: Secondary | ICD-10-CM | POA: Diagnosis not present

## 2024-03-06 DIAGNOSIS — M25561 Pain in right knee: Secondary | ICD-10-CM | POA: Diagnosis not present

## 2024-03-06 DIAGNOSIS — R41841 Cognitive communication deficit: Secondary | ICD-10-CM | POA: Diagnosis not present

## 2024-03-06 DIAGNOSIS — M25551 Pain in right hip: Secondary | ICD-10-CM | POA: Diagnosis not present

## 2024-03-06 DIAGNOSIS — M6281 Muscle weakness (generalized): Secondary | ICD-10-CM | POA: Diagnosis not present

## 2024-03-07 DIAGNOSIS — M6281 Muscle weakness (generalized): Secondary | ICD-10-CM | POA: Diagnosis not present

## 2024-03-07 DIAGNOSIS — M25551 Pain in right hip: Secondary | ICD-10-CM | POA: Diagnosis not present

## 2024-03-07 DIAGNOSIS — R41841 Cognitive communication deficit: Secondary | ICD-10-CM | POA: Diagnosis not present

## 2024-03-07 DIAGNOSIS — M25561 Pain in right knee: Secondary | ICD-10-CM | POA: Diagnosis not present

## 2024-03-10 DIAGNOSIS — R41841 Cognitive communication deficit: Secondary | ICD-10-CM | POA: Diagnosis not present

## 2024-03-10 DIAGNOSIS — M25561 Pain in right knee: Secondary | ICD-10-CM | POA: Diagnosis not present

## 2024-03-10 DIAGNOSIS — M25551 Pain in right hip: Secondary | ICD-10-CM | POA: Diagnosis not present

## 2024-03-10 DIAGNOSIS — M6281 Muscle weakness (generalized): Secondary | ICD-10-CM | POA: Diagnosis not present

## 2024-03-12 DIAGNOSIS — R41841 Cognitive communication deficit: Secondary | ICD-10-CM | POA: Diagnosis not present

## 2024-03-12 DIAGNOSIS — M6281 Muscle weakness (generalized): Secondary | ICD-10-CM | POA: Diagnosis not present

## 2024-03-12 DIAGNOSIS — M25561 Pain in right knee: Secondary | ICD-10-CM | POA: Diagnosis not present

## 2024-03-12 DIAGNOSIS — M25551 Pain in right hip: Secondary | ICD-10-CM | POA: Diagnosis not present

## 2024-03-13 DIAGNOSIS — M25551 Pain in right hip: Secondary | ICD-10-CM | POA: Diagnosis not present

## 2024-03-13 DIAGNOSIS — M25561 Pain in right knee: Secondary | ICD-10-CM | POA: Diagnosis not present

## 2024-03-13 DIAGNOSIS — M6281 Muscle weakness (generalized): Secondary | ICD-10-CM | POA: Diagnosis not present

## 2024-03-13 DIAGNOSIS — R41841 Cognitive communication deficit: Secondary | ICD-10-CM | POA: Diagnosis not present

## 2024-03-14 DIAGNOSIS — M25561 Pain in right knee: Secondary | ICD-10-CM | POA: Diagnosis not present

## 2024-03-14 DIAGNOSIS — M25551 Pain in right hip: Secondary | ICD-10-CM | POA: Diagnosis not present

## 2024-03-14 DIAGNOSIS — M6281 Muscle weakness (generalized): Secondary | ICD-10-CM | POA: Diagnosis not present

## 2024-03-14 DIAGNOSIS — R41841 Cognitive communication deficit: Secondary | ICD-10-CM | POA: Diagnosis not present

## 2024-03-17 DIAGNOSIS — R41841 Cognitive communication deficit: Secondary | ICD-10-CM | POA: Diagnosis not present

## 2024-03-17 DIAGNOSIS — M25551 Pain in right hip: Secondary | ICD-10-CM | POA: Diagnosis not present

## 2024-03-17 DIAGNOSIS — M25561 Pain in right knee: Secondary | ICD-10-CM | POA: Diagnosis not present

## 2024-03-17 DIAGNOSIS — M6281 Muscle weakness (generalized): Secondary | ICD-10-CM | POA: Diagnosis not present

## 2024-03-18 DIAGNOSIS — M6281 Muscle weakness (generalized): Secondary | ICD-10-CM | POA: Diagnosis not present

## 2024-03-18 DIAGNOSIS — M25551 Pain in right hip: Secondary | ICD-10-CM | POA: Diagnosis not present

## 2024-03-18 DIAGNOSIS — R41841 Cognitive communication deficit: Secondary | ICD-10-CM | POA: Diagnosis not present

## 2024-03-18 DIAGNOSIS — M25561 Pain in right knee: Secondary | ICD-10-CM | POA: Diagnosis not present

## 2024-03-19 DIAGNOSIS — R41841 Cognitive communication deficit: Secondary | ICD-10-CM | POA: Diagnosis not present

## 2024-03-19 DIAGNOSIS — M6281 Muscle weakness (generalized): Secondary | ICD-10-CM | POA: Diagnosis not present

## 2024-03-19 DIAGNOSIS — M25551 Pain in right hip: Secondary | ICD-10-CM | POA: Diagnosis not present

## 2024-03-19 DIAGNOSIS — M25561 Pain in right knee: Secondary | ICD-10-CM | POA: Diagnosis not present

## 2024-03-20 DIAGNOSIS — R41841 Cognitive communication deficit: Secondary | ICD-10-CM | POA: Diagnosis not present

## 2024-03-20 DIAGNOSIS — M6281 Muscle weakness (generalized): Secondary | ICD-10-CM | POA: Diagnosis not present

## 2024-03-20 DIAGNOSIS — M25561 Pain in right knee: Secondary | ICD-10-CM | POA: Diagnosis not present

## 2024-03-20 DIAGNOSIS — M25551 Pain in right hip: Secondary | ICD-10-CM | POA: Diagnosis not present

## 2024-03-21 DIAGNOSIS — M25551 Pain in right hip: Secondary | ICD-10-CM | POA: Diagnosis not present

## 2024-03-21 DIAGNOSIS — R41841 Cognitive communication deficit: Secondary | ICD-10-CM | POA: Diagnosis not present

## 2024-03-21 DIAGNOSIS — M6281 Muscle weakness (generalized): Secondary | ICD-10-CM | POA: Diagnosis not present

## 2024-03-21 DIAGNOSIS — M25561 Pain in right knee: Secondary | ICD-10-CM | POA: Diagnosis not present

## 2024-03-24 DIAGNOSIS — R41841 Cognitive communication deficit: Secondary | ICD-10-CM | POA: Diagnosis not present

## 2024-03-24 DIAGNOSIS — M6281 Muscle weakness (generalized): Secondary | ICD-10-CM | POA: Diagnosis not present

## 2024-03-24 DIAGNOSIS — M25561 Pain in right knee: Secondary | ICD-10-CM | POA: Diagnosis not present

## 2024-03-24 DIAGNOSIS — M25551 Pain in right hip: Secondary | ICD-10-CM | POA: Diagnosis not present

## 2024-03-25 DIAGNOSIS — M25561 Pain in right knee: Secondary | ICD-10-CM | POA: Diagnosis not present

## 2024-03-25 DIAGNOSIS — R41841 Cognitive communication deficit: Secondary | ICD-10-CM | POA: Diagnosis not present

## 2024-03-25 DIAGNOSIS — M6281 Muscle weakness (generalized): Secondary | ICD-10-CM | POA: Diagnosis not present

## 2024-03-25 DIAGNOSIS — M25551 Pain in right hip: Secondary | ICD-10-CM | POA: Diagnosis not present

## 2024-03-26 DIAGNOSIS — R41841 Cognitive communication deficit: Secondary | ICD-10-CM | POA: Diagnosis not present

## 2024-03-26 DIAGNOSIS — M25551 Pain in right hip: Secondary | ICD-10-CM | POA: Diagnosis not present

## 2024-03-26 DIAGNOSIS — M25561 Pain in right knee: Secondary | ICD-10-CM | POA: Diagnosis not present

## 2024-03-26 DIAGNOSIS — M6281 Muscle weakness (generalized): Secondary | ICD-10-CM | POA: Diagnosis not present

## 2024-03-26 NOTE — Progress Notes (Signed)
 [facility] - Clinic Note  04/02/2024   CHIEF COMPLAINT Patient presents for Retina Follow Up  HISTORY OF PRESENT ILLNESS: Rachel Morris is a 89 y.o. female who presents to the clinic today for:  HPI     Retina Follow Up   In left eye.  This started 6 weeks ago.  Duration of 6 weeks.  Since onset it is stable.  I, the attending physician,  performed the HPI with the patient and updated documentation appropriately.        Comments   6 week retina follow up CME OS pt is reporting no vision changes noticed she denies any flashes or floaters       Last edited by Valdemar Rogue, MD on 04/06/2024  8:41 PM.      Referring physician: Octavia Charlie Hamilton, MD [address] [place],  [place] [address]  HISTORICAL INFORMATION:  Selected notes from the MEDICAL RECORD NUMBER Referred by Dr. Hamilton Octavia for CME OS LEE:  Ocular Hx- PMH-   CURRENT MEDICATIONS: Current Outpatient Medications (Ophthalmic Drugs)  Medication Sig   dorzolamide-timolol (COSOPT) 2-0.5 % ophthalmic solution 1 drop 2 (two) times daily.   Bromfenac  Sodium 0.07 % SOLN Place 1 drop into the left eye 2 (two) times daily.   prednisoLONE  acetate (PRED FORTE ) 1 % ophthalmic suspension Place 1 drop into the left eye 2 (two) times daily.   No current facility-administered medications for this visit. (Ophthalmic Drugs)   Current Outpatient Medications (Other)  Medication Sig   acetaminophen  (TYLENOL ) 500 MG tablet Take 1,000 mg by mouth 2 (two) times daily as needed.   cholecalciferol  (VITAMIN D ) 1000 UNITS tablet Take 1,000 Units by mouth daily.   EPINEPHrine  (EPIPEN  2-PAK) 0.3 mg/0.3 mL IJ SOAJ injection Inject 0.3 mg into the muscle as needed for anaphylaxis.   fluticasone (FLONASE) 50 MCG/ACT nasal spray Place 2 sprays into both nostrils daily.   gabapentin  (NEURONTIN ) 100 MG capsule Take 1 capsule (100 mg total) by mouth at bedtime.   rosuvastatin  (CRESTOR ) 5 MG tablet Take 1 tablet (5  mg total) by mouth 3 (three) times a week.   senna (SENOKOT) 8.6 MG TABS tablet Take 1 tablet by mouth daily as needed for mild constipation.   tuberculin (TUBERSOL) 5 UNIT/0.1ML injection Inject 0.1 mLs into the skin daily.   No current facility-administered medications for this visit. (Other)   REVIEW OF SYSTEMS: ROS   Positive for: Cardiovascular, Eyes Last edited by Resa Delon ORN, COT on 04/02/2024  1:44 PM.     ALLERGIES Allergies  Allergen Reactions   Brandy [Alcohol] Anaphylaxis    Related to grapes   Prune Anaphylaxis    PLUMS,GRAPES,RAISINS   Aspirin  Other (See Comments)    Stomach bleed   Augmentin [Amoxicillin-Pot Clavulanate] Hives and Itching   Clindamycin Other (See Comments)    unkown   Omnicef [Cefdinir] Hives   Vinegar [Acetic Acid]     basalmic   Alprazolam Rash   Ceftin [Cefuroxime Axetil] Rash   Doxycycline  Rash   Eryc [Erythromycin] Rash   Penicillins Rash   PAST MEDICAL HISTORY Past Medical History:  Diagnosis Date   Allergic rhinitis due to pollen    Anxiety state, unspecified    Arthritis    RIGHT LEG, HANDS.   Cancer (HCC)    FACE/NOSE -SKIN-squamous cell and basal cell.   Disorder of bone and cartilage, unspecified    Lumbago    Lyme borreliosis  Other atopic dermatitis and related conditions    Other dysphagia    Reflux esophagitis    Senile osteoporosis    TMJ dysfunction    MORE ON LEFT WEARS BITE PROTECTION AT NIGHT   Past Surgical History:  Procedure Laterality Date   APPENDECTOMY  1950   CATARACT EXTRACTION, BILATERAL Bilateral    EYE SURGERY Right    right eye ptrigium excision x2   HERNIA REPAIR Bilateral    TOTAL HIP ARTHROPLASTY Right 12/10/2015   Procedure: RIGHT TOTAL HIP ARTHROPLASTY ANTERIOR APPROACH;  Surgeon: Lonni CINDERELLA Poli, MD;  Location: [facility];  Service: Orthopedics;  Laterality: Right;  Spinal to General   FAMILY HISTORY History reviewed. No pertinent family history. SOCIAL HISTORY Social  History   Tobacco Use   Smoking status: Never    Passive exposure: Past   Smokeless tobacco: Never   Tobacco comments:    past seconday smoke from husband  Vaping Use   Vaping status: Never Used  Substance Use Topics   Alcohol use: Not Currently    Alcohol/week: 0.0 standard drinks of alcohol   Drug use: No       OPHTHALMIC EXAM:  Base Eye Exam     Visual Acuity (Snellen - Linear)       Right Left   Dist cc 20/20 20/30 -2   Dist ph cc  NI         Tonometry (Tonopen, 1:52 PM)       Right Left   Pressure 10 16         Pupils       Pupils Dark Light Shape React APD   Right PERRL 3 2 Round Brisk None   Left PERRL 3 2 Round Brisk None         Visual Fields       Left Right    Full Full         Extraocular Movement       Right Left    Full, Ortho Full, Ortho         Neuro/Psych     Oriented x3: Yes   Mood/Affect: Normal         Dilation     Both eyes: 2.5% Phenylephrine @ 1:52 PM           Slit Lamp and Fundus Exam     Slit Lamp Exam       Right Left   Lids/Lashes Dermatochalasis - upper lid Dermatochalasis - upper lid   Conjunctiva/Sclera White and quiet STK ST quad   Cornea arcus, 1+ Punctate epithelial erosions, trace tear film debris, mild EBMD arcus, 1+ fine Punctate epithelial erosions, trace tear film debris   Anterior Chamber deep and clear deep and clear, no cell or flare   Iris Round and moderately dilated Round and dilated   Lens PC IOL in good position 3 piece PC IOL in good position   Anterior Vitreous syneresis Mild syneresis         Fundus Exam       Right Left   Disc Pink and Sharp Pink and Sharp, mild peripapillary drusen   C/D Ratio 0.3 0.2   Macula Flat, Blunted foveal reflex, RPE mottling, mild drusen nasal mac Flat, Blunted foveal reflex, central edema / CME- stably resolved, no heme   Vessels attenuated, Tortuous attenuated, Tortuous   Periphery Attached, midzonal drusen, No heme Attached, midzonal  drusen, No heme  Refraction     Wearing Rx       Sphere Cylinder Axis Add   Right -2.75 +0.75 009 +2.75   Left -1.75 +1.75 170 +2.75           IMAGING AND PROCEDURES  Imaging and Procedures for 04/02/2024  OCT, Retina - OU - Both Eyes       Right Eye Quality was good. Central Foveal Thickness: 280. Progression has been stable. Findings include normal foveal contour, no IRF, no SRF.   Left Eye Quality was borderline. Central Foveal Thickness: 281. Progression has been stable. Findings include no IRF, no SRF, abnormal foveal contour (stable resolution of central edema/IRF/SRF).   Notes *Images captured and stored on drive  Diagnosis / Impression:  OD: NFP, no IRF/SRF OS: stable resolution of central edema/IRF/SRF  Clinical management:  See below  Abbreviations: NFP - Normal foveal profile. CME - cystoid macular edema. PED - pigment epithelial detachment. IRF - intraretinal fluid. SRF - subretinal fluid. EZ - ellipsoid zone. ERM - epiretinal membrane. ORA - outer retinal atrophy. ORT - outer retinal tubulation. SRHM - subretinal hyper-reflective material. IRHM - intraretinal hyper-reflective material             ASSESSMENT/PLAN:   ICD-10-CM   1. CME (cystoid macular edema), left  H35.352 OCT, Retina - OU - Both Eyes    2. Intermediate stage nonexudative age-related macular degeneration of both eyes  H35.3132     3. Pseudophakia, both eyes  Z96.1      1. CME OS  - incidental finding on routine exam by Dr. Octavia  - pt reported vision decreased OS for several weeks - FA 08.04.25 shows OS Perifoveal petaloid hyperfluorescent leakage, hyperfluorescent staining of disc -- consistent w/ CME - started PF and Bromfenac  QID OS on 08.04.25 -- questionable compliance -- pt has since moved into assisted living - s/p STK OS #1 (09.03.25) - stopped Latanoprost  in both eyes; improved compliance w/ PF and Bromfenac  QID OS - OCT shows stable resolution of  central edema/IRF/SRF   - Decrease PF and Bromfenac  to TID as of 12.03.25  - f/u 8 weeks -- DFE/OCT  2. Age related macular degeneration, non-exudative, both eyes - The incidence, anatomy, and pathology of dry AMD, risk of progression, and the AREDS and AREDS 2 study including smoking risks discussed with patient.  - Recommend amsler grid monitoring  - monitor  3. Pseudophakia OU  - s/p CE/IOL OU (Dr. Lamar Octavia)  - IOLs in good position, doing well  - monitor   Ophthalmic Meds Ordered this visit:  Meds ordered this encounter  Medications   DISCONTD: prednisoLONE  acetate (PRED FORTE ) 1 % ophthalmic suspension    Sig: Place 1 drop into the left eye 2 (two) times daily.    Dispense:  15 mL    Refill:  2   DISCONTD: Bromfenac  Sodium 0.07 % SOLN    Sig: Place 1 drop into the left eye 2 (two) times daily.    Dispense:  6 mL    Refill:  5   Bromfenac  Sodium 0.07 % SOLN    Sig: Place 1 drop into the left eye 2 (two) times daily.    Dispense:  6 mL    Refill:  5   prednisoLONE  acetate (PRED FORTE ) 1 % ophthalmic suspension    Sig: Place 1 drop into the left eye 2 (two) times daily.    Dispense:  15 mL    Refill:  2  Return in about 8 weeks (around 05/28/2024) for f/u CME OS, DFE, OCT.  There are no Patient Instructions on file for this visit.  Explained the diagnoses, plan, and follow up with the patient and they expressed understanding.  Patient expressed understanding of the importance of proper follow up care.   This document serves as a record of services personally performed by Redell JUDITHANN Hans, MD, PhD. It was created on their behalf by Almetta Pesa, an ophthalmic technician. The creation of this record is the provider's dictation and/or activities during the visit.    Electronically signed by: Almetta Pesa, OA, 04/06/24  8:45 PM  This document serves as a record of services personally performed by Redell JUDITHANN Hans, MD, PhD. It was created on their behalf by  Wanda GEANNIE Keens, COT an ophthalmic technician. The creation of this record is the provider's dictation and/or activities during the visit.    Electronically signed by:  Wanda GEANNIE Keens, COT  04/06/24 8:45 PM  Redell JUDITHANN Hans, M.D., Ph.D. Diseases & Surgery of the Retina and Vitreous [facility] 04/02/2024  I have reviewed the above documentation for accuracy and completeness, and I agree with the above. Redell JUDITHANN Hans, M.D., Ph.D. 04/06/24 8:47 PM   Abbreviations: M myopia (nearsighted); A astigmatism; H hyperopia (farsighted); P presbyopia; Mrx spectacle prescription;  CTL contact lenses; OD right eye; OS left eye; OU both eyes  XT exotropia; ET esotropia; PEK punctate epithelial keratitis; PEE punctate epithelial erosions; DES dry eye syndrome; MGD meibomian gland dysfunction; ATs artificial tears; PFAT's preservative free artificial tears; NSC nuclear sclerotic cataract; PSC posterior subcapsular cataract; ERM epi-retinal membrane; PVD posterior vitreous detachment; RD retinal detachment; DM diabetes mellitus; DR diabetic retinopathy; NPDR non-proliferative diabetic retinopathy; PDR proliferative diabetic retinopathy; CSME clinically significant macular edema; DME diabetic macular edema; dbh dot blot hemorrhages; CWS cotton wool spot; POAG primary open angle glaucoma; C/D cup-to-disc ratio; HVF humphrey visual field; GVF goldmann visual field; OCT optical coherence tomography; IOP intraocular pressure; BRVO Branch retinal vein occlusion; CRVO central retinal vein occlusion; CRAO central retinal artery occlusion; BRAO branch retinal artery occlusion; RT retinal tear; SB scleral buckle; PPV pars plana vitrectomy; VH Vitreous hemorrhage; PRP panretinal laser photocoagulation; IVK intravitreal kenalog ; VMT vitreomacular traction; MH Macular hole;  NVD neovascularization of the disc; NVE neovascularization elsewhere; AREDS age related eye disease study; ARMD age related  macular degeneration; POAG primary open angle glaucoma; EBMD epithelial/anterior basement membrane dystrophy; ACIOL anterior chamber intraocular lens; IOL intraocular lens; PCIOL posterior chamber intraocular lens; Phaco/IOL phacoemulsification with intraocular lens placement; PRK photorefractive keratectomy; LASIK laser assisted in situ keratomileusis; HTN hypertension; DM diabetes mellitus; COPD chronic obstructive pulmonary disease

## 2024-04-02 ENCOUNTER — Encounter (INDEPENDENT_AMBULATORY_CARE_PROVIDER_SITE_OTHER): Payer: Self-pay | Admitting: Ophthalmology

## 2024-04-02 ENCOUNTER — Ambulatory Visit (INDEPENDENT_AMBULATORY_CARE_PROVIDER_SITE_OTHER): Admitting: Ophthalmology

## 2024-04-02 DIAGNOSIS — H353132 Nonexudative age-related macular degeneration, bilateral, intermediate dry stage: Secondary | ICD-10-CM

## 2024-04-02 DIAGNOSIS — Z961 Presence of intraocular lens: Secondary | ICD-10-CM | POA: Diagnosis not present

## 2024-04-02 DIAGNOSIS — H35352 Cystoid macular degeneration, left eye: Secondary | ICD-10-CM

## 2024-04-02 MED ORDER — PREDNISOLONE ACETATE 1 % OP SUSP: 1.0000 [drp] | Freq: Two times a day (BID) | OPHTHALMIC | 2 refills | Status: DC

## 2024-04-02 MED ORDER — PREDNISOLONE ACETATE 1 % OP SUSP: 1.0000 [drp] | Freq: Two times a day (BID) | OPHTHALMIC | 2 refills | Status: AC

## 2024-04-02 MED ORDER — BROMFENAC SODIUM 0.07 % OP SOLN: 1.0000 [drp] | Freq: Two times a day (BID) | OPHTHALMIC | 5 refills | Status: AC

## 2024-04-02 MED ORDER — BROMFENAC SODIUM 0.07 % OP SOLN: 1.0000 [drp] | Freq: Two times a day (BID) | OPHTHALMIC | 5 refills | Status: DC

## 2024-04-06 ENCOUNTER — Encounter (INDEPENDENT_AMBULATORY_CARE_PROVIDER_SITE_OTHER): Payer: Self-pay | Admitting: Ophthalmology

## 2024-04-17 ENCOUNTER — Non-Acute Institutional Stay: Payer: Self-pay | Admitting: Internal Medicine

## 2024-04-17 DIAGNOSIS — F339 Major depressive disorder, recurrent, unspecified: Secondary | ICD-10-CM | POA: Diagnosis not present

## 2024-04-17 DIAGNOSIS — R7989 Other specified abnormal findings of blood chemistry: Secondary | ICD-10-CM

## 2024-04-17 DIAGNOSIS — I499 Cardiac arrhythmia, unspecified: Secondary | ICD-10-CM

## 2024-04-17 DIAGNOSIS — E785 Hyperlipidemia, unspecified: Secondary | ICD-10-CM | POA: Diagnosis not present

## 2024-04-17 DIAGNOSIS — R42 Dizziness and giddiness: Secondary | ICD-10-CM

## 2024-04-17 DIAGNOSIS — R4189 Other symptoms and signs involving cognitive functions and awareness: Secondary | ICD-10-CM

## 2024-04-18 ENCOUNTER — Encounter: Payer: Self-pay | Admitting: Internal Medicine

## 2024-04-18 NOTE — Progress Notes (Signed)
 "  Location:  [facility] of Service:  ALF ([id number])  Provider:   Code Status: DNR Goals of Care:     01/18/2024    1:10 PM  Advanced Directives  Does Patient Have a Medical Advance Directive? Yes  Type of Estate Agent of [place];Living will;Out of facility DNR (pink MOST or yellow form)  Does patient want to make changes to medical advance directive? No - Patient declined  Copy of Healthcare Power of Attorney in Chart? Yes - validated most recent copy scanned in chart (See row information)     Chief Complaint  Patient presents with   Care Management    HPI: Patient is a 89 y.o. female seen today for medical management of chronic diseases.    Lives in AL in [facility]   Patient has history of hyper lipidemia,   arthritis status post right hip arthroplasty,    essential tremors, anxiety and constipation   H/o Alcohol Abuse. Quit since 12/20   Has stayed on Gabapentin  at night   Plantar Fascitis H/o Cognitive impairment Some worsening recently and daughter decided to move her to AL Also has history of CME and not exudative macular degeneration   Patient has adjusted well in AL She is walking without any assist.  No behaviors.. Wt Readings from Last 3 Encounters:  04/17/24 160 lb (72.6 kg)  01/18/24 156 lb 6.4 oz (70.9 kg)  12/20/23 156 lb 12.8 oz (71.1 kg)    Had no complaints today Past Medical History:  Diagnosis Date   Allergic rhinitis due to pollen    Anxiety state, unspecified    Arthritis    RIGHT LEG, HANDS.   Cancer (HCC)    FACE/NOSE -SKIN-squamous cell and basal cell.   Disorder of bone and cartilage, unspecified    Lumbago    Lyme borreliosis    Other atopic dermatitis and related conditions    Other dysphagia    Reflux esophagitis    Senile osteoporosis    TMJ dysfunction    MORE ON LEFT WEARS BITE PROTECTION AT NIGHT    Past Surgical History:  Procedure Laterality Date   APPENDECTOMY  1950   CATARACT  EXTRACTION, BILATERAL Bilateral    EYE SURGERY Right    right eye ptrigium excision x2   HERNIA REPAIR Bilateral    TOTAL HIP ARTHROPLASTY Right 12/10/2015   Procedure: RIGHT TOTAL HIP ARTHROPLASTY ANTERIOR APPROACH;  Surgeon: Lonni CINDERELLA Poli, MD;  Location: [facility];  Service: Orthopedics;  Laterality: Right;  Spinal to General    Allergies[1]  Outpatient Encounter Medications as of 04/17/2024  Medication Sig   acetaminophen  (TYLENOL ) 500 MG tablet Take 1,000 mg by mouth 2 (two) times daily as needed.   Bromfenac  Sodium 0.07 % SOLN Place 1 drop into the left eye 2 (two) times daily.   cholecalciferol  (VITAMIN D ) 1000 UNITS tablet Take 1,000 Units by mouth daily.   dorzolamide-timolol (COSOPT) 2-0.5 % ophthalmic solution 1 drop 2 (two) times daily.   EPINEPHrine  (EPIPEN  2-PAK) 0.3 mg/0.3 mL IJ SOAJ injection Inject 0.3 mg into the muscle as needed for anaphylaxis.   fluticasone (FLONASE) 50 MCG/ACT nasal spray Place 2 sprays into both nostrils daily.   gabapentin  (NEURONTIN ) 100 MG capsule Take 1 capsule (100 mg total) by mouth at bedtime.   prednisoLONE  acetate (PRED FORTE ) 1 % ophthalmic suspension Place 1 drop into the left eye 2 (two) times daily.   rosuvastatin  (CRESTOR ) 5 MG tablet Take 1 tablet (  5 mg total) by mouth 3 (three) times a week.   senna (SENOKOT) 8.6 MG TABS tablet Take 1 tablet by mouth daily as needed for mild constipation.   tuberculin (TUBERSOL) 5 UNIT/0.1ML injection Inject 0.1 mLs into the skin daily.   No facility-administered encounter medications on file as of 04/17/2024.    Review of Systems:  Review of Systems  Constitutional:  Negative for activity change and appetite change.  HENT: Negative.    Respiratory:  Negative for cough and shortness of breath.   Cardiovascular:  Negative for leg swelling.  Gastrointestinal:  Negative for constipation.  Genitourinary: Negative.   Musculoskeletal:  Negative for arthralgias, gait problem and myalgias.  Skin:  Negative.   Neurological:  Negative for dizziness and weakness.  Psychiatric/Behavioral:  Positive for confusion. Negative for dysphoric mood and sleep disturbance.     Health Maintenance  Topic Date Due   Influenza Vaccine  11/30/2023   COVID-19 Vaccine (7 - 2025-26 season) 12/31/2023   Medicare Annual Wellness (AWV)  01/17/2025   DTaP/Tdap/Td (3 - Td or Tdap) 05/03/2025   Pneumococcal Vaccine: 50+ Years  Completed   Bone Density Scan  Completed   Zoster Vaccines- Shingrix  Completed   Meningococcal B Vaccine  Aged Out   Hepatitis B Vaccines 19-59 Average Risk  Discontinued   Mammogram  Discontinued    Physical Exam: Vitals:   04/17/24 1448  BP: (!) 134/92  Pulse: 75  Resp: 18  Temp: 98.6 F (37 C)  Weight: 160 lb (72.6 kg)   Body mass index is 28.34 kg/m. Physical Exam Vitals reviewed.  Constitutional:      Appearance: Normal appearance.  HENT:     Head: Normocephalic.     Nose: Nose normal.     Mouth/Throat:     Mouth: Mucous membranes are moist.     Pharynx: Oropharynx is clear.  Eyes:     Pupils: Pupils are equal, round, and reactive to light.  Cardiovascular:     Rate and Rhythm: Normal rate. Rhythm irregular.     Pulses: Normal pulses.     Heart sounds: Normal heart sounds. No murmur heard. Pulmonary:     Effort: Pulmonary effort is normal.     Breath sounds: Normal breath sounds.  Abdominal:     General: Abdomen is flat. Bowel sounds are normal.     Palpations: Abdomen is soft.  Musculoskeletal:        General: No swelling.     Cervical back: Neck supple.  Skin:    General: Skin is warm.  Neurological:     General: No focal deficit present.     Mental Status: She is alert.  Psychiatric:        Mood and Affect: Mood normal.        Thought Content: Thought content normal.     Labs reviewed: Basic Metabolic Panel: Recent Labs    05/31/23 0810  NA 139  K 4.6  CL 108  CO2 25  GLUCOSE 87  BUN 20  CREATININE 0.87  CALCIUM  9.6  MG 2.3   TSH 4.30   Liver Function Tests: Recent Labs    05/31/23 0810  AST 15  ALT 13  BILITOT 0.4  PROT 6.4   No results for input(s): LIPASE, AMYLASE in the last 8760 hours. No results for input(s): AMMONIA in the last 8760 hours. CBC: Recent Labs    05/31/23 0810  WBC 9.0  NEUTROABS 5,319  HGB 12.9  HCT 38.9  MCV  92.0  PLT 286   Lipid Panel: No results for input(s): CHOL, HDL, LDLCALC, TRIG, CHOLHDL, LDLDIRECT in the last 8760 hours. No results found for: HGBA1C  Procedures since last visit: OCT, Retina - OU - Both Eyes Result Date: 04/06/2024 Right Eye Quality was good. Central Foveal Thickness: 280. Progression has been stable. Findings include normal foveal contour, no IRF, no SRF. Left Eye Quality was borderline. Central Foveal Thickness: 281. Progression has been stable. Findings include no IRF, no SRF, abnormal foveal contour (stable resolution of central edema/IRF/SRF). Notes *Images captured and stored on drive Diagnosis / Impression: OD: NFP, no IRF/SRF OS: stable resolution of central edema/IRF/SRF Clinical management: See below Abbreviations: NFP - Normal foveal profile. CME - cystoid macular edema. PED - pigment epithelial detachment. IRF - intraretinal fluid. SRF - subretinal fluid. EZ - ellipsoid zone. ERM - epiretinal membrane. ORA - outer retinal atrophy. ORT - outer retinal tubulation. SRHM - subretinal hyper-reflective material. IRHM - intraretinal hyper-reflective material    Assessment/Plan 1. Dizziness (Primary) Cardiac Monitor was negative Has not been issue recently 2. Irregular heart rhythm Monitor showed no Concerning rythm  3. Depression, recurrent No meds  4. Cognitive impairment Doing well in AL  5. Hyperlipidemia with target LDL less than 100 Repeat Lipids  6. Elevated TSH Repeat TSH  7  Insomnia, unspecified type Gabapentin    Labs/tests ordered:  Labs ordered Next appt:  Visit date not found         [1]   Allergies Allergen Reactions   Brandy [Alcohol] Anaphylaxis    Related to grapes   Prune Anaphylaxis    PLUMS,GRAPES,RAISINS   Aspirin  Other (See Comments)    Stomach bleed   Augmentin [Amoxicillin-Pot Clavulanate] Hives and Itching   Clindamycin Other (See Comments)    unkown   Omnicef [Cefdinir] Hives   Vinegar [Acetic Acid]     basalmic   Alprazolam Rash   Ceftin [Cefuroxime Axetil] Rash   Doxycycline  Rash   Eryc [Erythromycin] Rash   Penicillins Rash   "

## 2024-05-21 NOTE — Progress Notes (Signed)
 " [facility] - Clinic Note  05/28/2024   CHIEF COMPLAINT Patient presents for Retina Follow Up  HISTORY OF PRESENT ILLNESS: Rachel Morris is a 89 y.o. female who presents to the clinic today for:  HPI     Retina Follow Up   In left eye.  This started 8 weeks ago.  Duration of 8 weeks.  Since onset it is stable.        Comments   8 week retina follow up CME OS pr is reporting no vision changes noticed still little foggy she denies any flashes has some floaters PF and Bromfenac  to BID      Last edited by Resa Delon ORN, COT on 05/28/2024  1:38 PM.     Patient states that the vision is the same.   Referring physician: Octavia Charlie Hamilton, MD [address] [place],  [place] [address]  HISTORICAL INFORMATION:  Selected notes from the MEDICAL RECORD NUMBER Referred by Dr. Hamilton Octavia for CME OS LEE:  Ocular Hx- PMH-   CURRENT MEDICATIONS: Current Outpatient Medications (Ophthalmic Drugs)  Medication Sig   Bromfenac  Sodium 0.07 % SOLN Place 1 drop into the left eye 2 (two) times daily.   dorzolamide-timolol (COSOPT) 2-0.5 % ophthalmic solution 1 drop 2 (two) times daily.   prednisoLONE  acetate (PRED FORTE ) 1 % ophthalmic suspension Place 1 drop into the left eye 2 (two) times daily.   No current facility-administered medications for this visit. (Ophthalmic Drugs)   Current Outpatient Medications (Other)  Medication Sig   acetaminophen  (TYLENOL ) 500 MG tablet Take 1,000 mg by mouth 2 (two) times daily as needed.   cholecalciferol  (VITAMIN D ) 1000 UNITS tablet Take 1,000 Units by mouth daily.   EPINEPHrine  (EPIPEN  2-PAK) 0.3 mg/0.3 mL IJ SOAJ injection Inject 0.3 mg into the muscle as needed for anaphylaxis.   fluticasone (FLONASE) 50 MCG/ACT nasal spray Place 2 sprays into both nostrils daily.   gabapentin  (NEURONTIN ) 100 MG capsule Take 1 capsule (100 mg total) by mouth at bedtime.   rosuvastatin  (CRESTOR ) 5 MG tablet Take 1 tablet (5 mg  total) by mouth 3 (three) times a week.   senna (SENOKOT) 8.6 MG TABS tablet Take 1 tablet by mouth daily as needed for mild constipation.   tuberculin (TUBERSOL) 5 UNIT/0.1ML injection Inject 0.1 mLs into the skin daily.   No current facility-administered medications for this visit. (Other)   REVIEW OF SYSTEMS: ROS   Positive for: Cardiovascular, Eyes Last edited by Resa Delon ORN, COT on 05/28/2024  1:31 PM.      ALLERGIES Allergies  Allergen Reactions   Brandy [Alcohol] Anaphylaxis    Related to grapes   Prune Anaphylaxis    PLUMS,GRAPES,RAISINS   Aspirin  Other (See Comments)    Stomach bleed   Augmentin [Amoxicillin-Pot Clavulanate] Hives and Itching   Clindamycin Other (See Comments)    unkown   Omnicef [Cefdinir] Hives   Vinegar [Acetic Acid]     basalmic   Alprazolam Rash   Ceftin [Cefuroxime Axetil] Rash   Doxycycline  Rash   Eryc [Erythromycin] Rash   Penicillins Rash   PAST MEDICAL HISTORY Past Medical History:  Diagnosis Date   Allergic rhinitis due to pollen    Anxiety state, unspecified    Arthritis    RIGHT LEG, HANDS.   Cancer (HCC)    FACE/NOSE -SKIN-squamous cell and basal cell.   Disorder of bone and cartilage, unspecified    Lumbago    Lyme  borreliosis    Other atopic dermatitis and related conditions    Other dysphagia    Reflux esophagitis    Senile osteoporosis    TMJ dysfunction    MORE ON LEFT WEARS BITE PROTECTION AT NIGHT   Past Surgical History:  Procedure Laterality Date   APPENDECTOMY  1950   CATARACT EXTRACTION, BILATERAL Bilateral    EYE SURGERY Right    right eye ptrigium excision x2   HERNIA REPAIR Bilateral    TOTAL HIP ARTHROPLASTY Right 12/10/2015   Procedure: RIGHT TOTAL HIP ARTHROPLASTY ANTERIOR APPROACH;  Surgeon: Lonni CINDERELLA Poli, MD;  Location: [facility];  Service: Orthopedics;  Laterality: Right;  Spinal to General   FAMILY HISTORY History reviewed. No pertinent family history. SOCIAL HISTORY Social  History   Tobacco Use   Smoking status: Never    Passive exposure: Past   Smokeless tobacco: Never   Tobacco comments:    past seconday smoke from husband  Vaping Use   Vaping status: Never Used  Substance Use Topics   Alcohol use: Not Currently    Alcohol/week: 0.0 standard drinks of alcohol   Drug use: No       OPHTHALMIC EXAM:  Base Eye Exam     Visual Acuity (Snellen - Linear)       Right Left   Dist cc 20/20 -2 20/30 2   Dist ph cc  NI         Tonometry (Tonopen, 1:36 PM)       Right Left   Pressure 15 18         Pupils       Pupils Dark Light Shape React APD   Right PERRL 3 2 Round Brisk None   Left PERRL 3 2 Round Brisk None         Visual Fields       Left Right    Full Full         Extraocular Movement       Right Left    Full, Ortho Full, Ortho         Neuro/Psych     Oriented x3: Yes   Mood/Affect: Normal         Dilation     Both eyes: 2.5% Phenylephrine @ 1:36 PM           Slit Lamp and Fundus Exam     Slit Lamp Exam       Right Left   Lids/Lashes Dermatochalasis - upper lid Dermatochalasis - upper lid   Conjunctiva/Sclera White and quiet STK ST quad   Cornea arcus, 1+ Punctate epithelial erosions, trace tear film debris, mild EBMD arcus, 1+ fine Punctate epithelial erosions, trace tear film debris   Anterior Chamber deep and clear deep and clear, no cell or flare   Iris Round and moderately dilated Round and dilated   Lens PC IOL in good position 3 piece PC IOL in good position   Anterior Vitreous syneresis Mild syneresis         Fundus Exam       Right Left   Disc Pink and Sharp Pink and Sharp, mild peripapillary drusen   C/D Ratio 0.3 0.2   Macula Flat, Blunted foveal reflex, RPE mottling, mild drusen nasal mac Flat, Blunted foveal reflex, central edema / CME- stably resolved, no heme   Vessels attenuated, Tortuous attenuated, Tortuous   Periphery Attached, midzonal drusen, No heme Attached,  midzonal drusen, No heme  Refraction     Wearing Rx       Sphere Cylinder Axis Add   Right -2.75 +0.75 009 +2.75   Left -1.75 +1.75 170 +2.75           IMAGING AND PROCEDURES  Imaging and Procedures for 05/28/2024  OCT, Retina - OU - Both Eyes       Right Eye Quality was good. Central Foveal Thickness: 278. Progression has been stable. Findings include normal foveal contour, no IRF, no SRF.   Left Eye Quality was borderline. Central Foveal Thickness: 281. Progression has improved. Findings include no IRF, no SRF, abnormal foveal contour (stable resolution of central edema/IRF/SRF).   Notes *Images captured and stored on drive  Diagnosis / Impression:  OD: NFP, no IRF/SRF OS: stable resolution of central edema/IRF/SRF  Clinical management:  See below  Abbreviations: NFP - Normal foveal profile. CME - cystoid macular edema. PED - pigment epithelial detachment. IRF - intraretinal fluid. SRF - subretinal fluid. EZ - ellipsoid zone. ERM - epiretinal membrane. ORA - outer retinal atrophy. ORT - outer retinal tubulation. SRHM - subretinal hyper-reflective material. IRHM - intraretinal hyper-reflective material              ASSESSMENT/PLAN:   ICD-10-CM   1. CME (cystoid macular edema), left  H35.352 OCT, Retina - OU - Both Eyes    2. Intermediate stage nonexudative age-related macular degeneration of both eyes  H35.3132     3. Pseudophakia, both eyes  Z96.1      1. CME OS  - incidental finding on routine exam by Dr. Octavia  - pt reported vision decreased OS for several weeks - FA 08.04.25 shows OS Perifoveal petaloid hyperfluorescent leakage, hyperfluorescent staining of disc -- consistent w/ CME - started PF and Bromfenac  QID OS on 08.04.25 -- questionable compliance -- pt has since moved into assisted living - s/p STK OS #1 (09.03.25) - OCT shows stable resolution of central edema/IRF/SRF   - Decrease PF and Bromfenac  to BID as of 12.03.25  -  f/u 8 weeks -- DFE/OCT  2. Age related macular degeneration, non-exudative, both eyes - The incidence, anatomy, and pathology of dry AMD, risk of progression, and the AREDS and AREDS 2 study including smoking risks discussed with patient.  - Recommend amsler grid monitoring  - monitor  3. Pseudophakia OU  - s/p CE/IOL OU (Dr. Lamar Octavia)  - IOLs in good position, doing well  - monitor   Ophthalmic Meds Ordered this visit:  No orders of the defined types were placed in this encounter.    Return in about 8 weeks (around 07/23/2024) for f/u CME OS, DFE, OCT.  There are no Patient Instructions on file for this visit.  Explained the diagnoses, plan, and follow up with the patient and they expressed understanding.  Patient expressed understanding of the importance of proper follow up care.   This document serves as a record of services personally performed by Redell JUDITHANN Hans, MD, PhD. It was created on their behalf by Almetta Pesa, an ophthalmic technician. The creation of this record is the provider's dictation and/or activities during the visit.    Electronically signed by: Almetta Pesa, OA, 05/28/24  1:58 PM  This document serves as a record of services personally performed by Redell JUDITHANN Hans, MD, PhD. It was created on their behalf by Wanda GEANNIE Keens, COT an ophthalmic technician. The creation of this record is the provider's dictation and/or activities during the visit.    Electronically  signed by:  Wanda GEANNIE Keens, COT  05/28/24 1:58 PM  Redell JUDITHANN Hans, M.D., Ph.D. Diseases & Surgery of the Retina and Vitreous [facility] 05/28/2024   Abbreviations: M myopia (nearsighted); A astigmatism; H hyperopia (farsighted); P presbyopia; Mrx spectacle prescription;  CTL contact lenses; OD right eye; OS left eye; OU both eyes  XT exotropia; ET esotropia; PEK punctate epithelial keratitis; PEE punctate epithelial erosions; DES dry eye syndrome; MGD  meibomian gland dysfunction; ATs artificial tears; PFAT's preservative free artificial tears; NSC nuclear sclerotic cataract; PSC posterior subcapsular cataract; ERM epi-retinal membrane; PVD posterior vitreous detachment; RD retinal detachment; DM diabetes mellitus; DR diabetic retinopathy; NPDR non-proliferative diabetic retinopathy; PDR proliferative diabetic retinopathy; CSME clinically significant macular edema; DME diabetic macular edema; dbh dot blot hemorrhages; CWS cotton wool spot; POAG primary open angle glaucoma; C/D cup-to-disc ratio; HVF humphrey visual field; GVF goldmann visual field; OCT optical coherence tomography; IOP intraocular pressure; BRVO Branch retinal vein occlusion; CRVO central retinal vein occlusion; CRAO central retinal artery occlusion; BRAO branch retinal artery occlusion; RT retinal tear; SB scleral buckle; PPV pars plana vitrectomy; VH Vitreous hemorrhage; PRP panretinal laser photocoagulation; IVK intravitreal kenalog ; VMT vitreomacular traction; MH Macular hole;  NVD neovascularization of the disc; NVE neovascularization elsewhere; AREDS age related eye disease study; ARMD age related macular degeneration; POAG primary open angle glaucoma; EBMD epithelial/anterior basement membrane dystrophy; ACIOL anterior chamber intraocular lens; IOL intraocular lens; PCIOL posterior chamber intraocular lens; Phaco/IOL phacoemulsification with intraocular lens placement; PRK photorefractive keratectomy; LASIK laser assisted in situ keratomileusis; HTN hypertension; DM diabetes mellitus; COPD chronic obstructive pulmonary disease  "

## 2024-05-28 ENCOUNTER — Encounter (INDEPENDENT_AMBULATORY_CARE_PROVIDER_SITE_OTHER): Payer: Self-pay | Admitting: Ophthalmology

## 2024-05-28 ENCOUNTER — Ambulatory Visit (INDEPENDENT_AMBULATORY_CARE_PROVIDER_SITE_OTHER): Admitting: Ophthalmology

## 2024-05-28 DIAGNOSIS — H35352 Cystoid macular degeneration, left eye: Secondary | ICD-10-CM | POA: Diagnosis not present

## 2024-05-28 DIAGNOSIS — H353132 Nonexudative age-related macular degeneration, bilateral, intermediate dry stage: Secondary | ICD-10-CM | POA: Diagnosis not present

## 2024-05-28 DIAGNOSIS — Z961 Presence of intraocular lens: Secondary | ICD-10-CM

## 2024-05-29 ENCOUNTER — Encounter (INDEPENDENT_AMBULATORY_CARE_PROVIDER_SITE_OTHER): Payer: Self-pay | Admitting: Ophthalmology

## 2024-07-23 ENCOUNTER — Encounter (INDEPENDENT_AMBULATORY_CARE_PROVIDER_SITE_OTHER): Admitting: Ophthalmology
